# Patient Record
Sex: Female | Born: 1935 | Race: White | Hispanic: No | Smoking: Never smoker
Health system: Southern US, Community
[De-identification: ages and names within clinical notes are randomized; demographics above are authoritative.]

## PROBLEM LIST (undated history)

## (undated) DIAGNOSIS — F200 Paranoid schizophrenia: Secondary | ICD-10-CM

## (undated) DIAGNOSIS — F329 Major depressive disorder, single episode, unspecified: Secondary | ICD-10-CM

## (undated) DIAGNOSIS — E1169 Type 2 diabetes mellitus with other specified complication: Secondary | ICD-10-CM

## (undated) DIAGNOSIS — F409 Phobic anxiety disorder, unspecified: Secondary | ICD-10-CM

## (undated) DIAGNOSIS — N393 Stress incontinence (female) (male): Secondary | ICD-10-CM

## (undated) DIAGNOSIS — F32A Depression, unspecified: Secondary | ICD-10-CM

## (undated) DIAGNOSIS — E669 Obesity, unspecified: Secondary | ICD-10-CM

## (undated) DIAGNOSIS — G3184 Mild cognitive impairment, so stated: Secondary | ICD-10-CM

## (undated) DIAGNOSIS — H269 Unspecified cataract: Secondary | ICD-10-CM

## (undated) DIAGNOSIS — B369 Superficial mycosis, unspecified: Secondary | ICD-10-CM

## (undated) DIAGNOSIS — F6081 Narcissistic personality disorder: Secondary | ICD-10-CM

## (undated) DIAGNOSIS — I10 Essential (primary) hypertension: Secondary | ICD-10-CM

## (undated) DIAGNOSIS — H3530 Unspecified macular degeneration: Secondary | ICD-10-CM

## (undated) DIAGNOSIS — E785 Hyperlipidemia, unspecified: Secondary | ICD-10-CM

## (undated) DIAGNOSIS — D649 Anemia, unspecified: Secondary | ICD-10-CM

## (undated) DIAGNOSIS — E119 Type 2 diabetes mellitus without complications: Secondary | ICD-10-CM

## (undated) HISTORY — DX: Type 2 diabetes mellitus without complications: E11.9

## (undated) HISTORY — DX: Superficial mycosis, unspecified: B36.9

## (undated) HISTORY — DX: Obesity, unspecified: E66.9

## (undated) HISTORY — PX: ABDOMINAL HYSTERECTOMY: SHX81

## (undated) HISTORY — PX: TOTAL ABDOMINAL HYSTERECTOMY W/ BILATERAL SALPINGOOPHORECTOMY: SHX83

## (undated) HISTORY — DX: Mild cognitive impairment of uncertain or unknown etiology: G31.84

## (undated) HISTORY — DX: Hyperlipidemia, unspecified: E78.5

## (undated) HISTORY — DX: Essential (primary) hypertension: I10

## (undated) HISTORY — DX: Phobic anxiety disorder, unspecified: F40.9

## (undated) HISTORY — DX: Unspecified macular degeneration: H35.30

## (undated) HISTORY — DX: Paranoid schizophrenia: F20.0

## (undated) HISTORY — DX: Depression, unspecified: F32.A

## (undated) HISTORY — DX: Unspecified cataract: H26.9

## (undated) HISTORY — DX: Type 2 diabetes mellitus with other specified complication: E11.69

## (undated) HISTORY — DX: Stress incontinence (female) (male): N39.3

## (undated) HISTORY — DX: Anemia, unspecified: D64.9

## (undated) HISTORY — DX: Major depressive disorder, single episode, unspecified: F32.9

## (undated) HISTORY — DX: Narcissistic personality disorder: F60.81

---

## 1993-04-09 ENCOUNTER — Encounter (INDEPENDENT_AMBULATORY_CARE_PROVIDER_SITE_OTHER): Payer: Self-pay | Admitting: *Deleted

## 1993-04-09 LAB — CONVERTED CEMR LAB

## 1997-08-17 ENCOUNTER — Encounter: Admission: RE | Admit: 1997-08-17 | Discharge: 1997-08-17 | Payer: Self-pay | Admitting: Sports Medicine

## 1997-08-25 ENCOUNTER — Encounter: Admission: RE | Admit: 1997-08-25 | Discharge: 1997-08-25 | Payer: Self-pay | Admitting: Family Medicine

## 1998-06-07 ENCOUNTER — Encounter: Admission: RE | Admit: 1998-06-07 | Discharge: 1998-06-07 | Payer: Self-pay | Admitting: Family Medicine

## 1998-06-09 ENCOUNTER — Encounter: Admission: RE | Admit: 1998-06-09 | Discharge: 1998-06-09 | Payer: Self-pay | Admitting: Family Medicine

## 1998-06-22 ENCOUNTER — Encounter: Admission: RE | Admit: 1998-06-22 | Discharge: 1998-06-22 | Payer: Self-pay | Admitting: Sports Medicine

## 1998-07-05 ENCOUNTER — Encounter: Admission: RE | Admit: 1998-07-05 | Discharge: 1998-07-05 | Payer: Self-pay | Admitting: Family Medicine

## 1998-07-14 ENCOUNTER — Encounter: Admission: RE | Admit: 1998-07-14 | Discharge: 1998-07-14 | Payer: Self-pay | Admitting: Family Medicine

## 1998-08-17 ENCOUNTER — Encounter: Admission: RE | Admit: 1998-08-17 | Discharge: 1998-08-17 | Payer: Self-pay | Admitting: Family Medicine

## 1998-09-23 ENCOUNTER — Encounter: Admission: RE | Admit: 1998-09-23 | Discharge: 1998-09-23 | Payer: Self-pay | Admitting: Family Medicine

## 1998-12-14 ENCOUNTER — Encounter: Admission: RE | Admit: 1998-12-14 | Discharge: 1998-12-14 | Payer: Self-pay | Admitting: Family Medicine

## 1999-02-01 ENCOUNTER — Encounter: Admission: RE | Admit: 1999-02-01 | Discharge: 1999-02-01 | Payer: Self-pay | Admitting: Family Medicine

## 1999-03-13 ENCOUNTER — Encounter: Admission: RE | Admit: 1999-03-13 | Discharge: 1999-03-13 | Payer: Self-pay | Admitting: Family Medicine

## 1999-04-19 ENCOUNTER — Encounter: Admission: RE | Admit: 1999-04-19 | Discharge: 1999-04-19 | Payer: Self-pay | Admitting: Family Medicine

## 1999-07-14 ENCOUNTER — Encounter: Admission: RE | Admit: 1999-07-14 | Discharge: 1999-07-14 | Payer: Self-pay | Admitting: Family Medicine

## 1999-08-04 ENCOUNTER — Encounter: Admission: RE | Admit: 1999-08-04 | Discharge: 1999-08-04 | Payer: Self-pay | Admitting: Family Medicine

## 1999-10-13 ENCOUNTER — Encounter: Admission: RE | Admit: 1999-10-13 | Discharge: 1999-10-13 | Payer: Self-pay | Admitting: Family Medicine

## 1999-11-07 ENCOUNTER — Encounter: Admission: RE | Admit: 1999-11-07 | Discharge: 1999-11-07 | Payer: Self-pay

## 2000-04-24 ENCOUNTER — Encounter: Admission: RE | Admit: 2000-04-24 | Discharge: 2000-04-24 | Payer: Self-pay | Admitting: Family Medicine

## 2000-07-22 ENCOUNTER — Encounter: Admission: RE | Admit: 2000-07-22 | Discharge: 2000-07-22 | Payer: Self-pay | Admitting: Family Medicine

## 2000-09-25 ENCOUNTER — Encounter: Admission: RE | Admit: 2000-09-25 | Discharge: 2000-09-25 | Payer: Self-pay | Admitting: Family Medicine

## 2000-09-30 ENCOUNTER — Encounter: Admission: RE | Admit: 2000-09-30 | Discharge: 2000-09-30 | Payer: Self-pay | Admitting: Family Medicine

## 2000-11-11 ENCOUNTER — Encounter: Admission: RE | Admit: 2000-11-11 | Discharge: 2000-11-11 | Payer: Self-pay | Admitting: Family Medicine

## 2000-11-22 ENCOUNTER — Encounter: Admission: RE | Admit: 2000-11-22 | Discharge: 2000-11-22 | Payer: Self-pay | Admitting: Family Medicine

## 2001-01-14 ENCOUNTER — Encounter: Admission: RE | Admit: 2001-01-14 | Discharge: 2001-01-14 | Payer: Self-pay | Admitting: Family Medicine

## 2001-01-14 ENCOUNTER — Encounter: Payer: Self-pay | Admitting: Sports Medicine

## 2001-04-07 ENCOUNTER — Encounter: Admission: RE | Admit: 2001-04-07 | Discharge: 2001-04-07 | Payer: Self-pay | Admitting: Family Medicine

## 2001-04-14 ENCOUNTER — Encounter: Admission: RE | Admit: 2001-04-14 | Discharge: 2001-04-14 | Payer: Self-pay | Admitting: Sports Medicine

## 2001-04-24 ENCOUNTER — Encounter: Admission: RE | Admit: 2001-04-24 | Discharge: 2001-04-24 | Payer: Self-pay | Admitting: Family Medicine

## 2001-05-05 ENCOUNTER — Encounter: Admission: RE | Admit: 2001-05-05 | Discharge: 2001-05-05 | Payer: Self-pay | Admitting: Family Medicine

## 2001-10-09 ENCOUNTER — Encounter: Admission: RE | Admit: 2001-10-09 | Discharge: 2001-10-09 | Payer: Self-pay | Admitting: Family Medicine

## 2002-04-13 ENCOUNTER — Encounter: Admission: RE | Admit: 2002-04-13 | Discharge: 2002-04-13 | Payer: Self-pay | Admitting: Family Medicine

## 2002-06-03 ENCOUNTER — Encounter: Admission: RE | Admit: 2002-06-03 | Discharge: 2002-06-03 | Payer: Self-pay | Admitting: Family Medicine

## 2002-06-09 ENCOUNTER — Encounter: Admission: RE | Admit: 2002-06-09 | Discharge: 2002-06-09 | Payer: Self-pay | Admitting: Family Medicine

## 2002-06-09 ENCOUNTER — Encounter: Payer: Self-pay | Admitting: Sports Medicine

## 2002-07-02 ENCOUNTER — Encounter: Admission: RE | Admit: 2002-07-02 | Discharge: 2002-07-02 | Payer: Self-pay | Admitting: Family Medicine

## 2002-07-21 ENCOUNTER — Encounter: Admission: RE | Admit: 2002-07-21 | Discharge: 2002-07-21 | Payer: Self-pay | Admitting: Family Medicine

## 2002-07-23 ENCOUNTER — Encounter: Admission: RE | Admit: 2002-07-23 | Discharge: 2002-07-23 | Payer: Self-pay | Admitting: Family Medicine

## 2002-08-19 ENCOUNTER — Encounter: Admission: RE | Admit: 2002-08-19 | Discharge: 2002-08-19 | Payer: Self-pay | Admitting: Family Medicine

## 2002-09-23 ENCOUNTER — Encounter: Admission: RE | Admit: 2002-09-23 | Discharge: 2002-09-23 | Payer: Self-pay | Admitting: Family Medicine

## 2002-10-15 ENCOUNTER — Encounter: Admission: RE | Admit: 2002-10-15 | Discharge: 2002-10-15 | Payer: Self-pay | Admitting: Family Medicine

## 2003-02-25 ENCOUNTER — Encounter: Admission: RE | Admit: 2003-02-25 | Discharge: 2003-02-25 | Payer: Self-pay | Admitting: Family Medicine

## 2003-05-12 ENCOUNTER — Encounter: Admission: RE | Admit: 2003-05-12 | Discharge: 2003-05-12 | Payer: Self-pay | Admitting: Family Medicine

## 2003-07-14 ENCOUNTER — Encounter: Admission: RE | Admit: 2003-07-14 | Discharge: 2003-07-14 | Payer: Self-pay | Admitting: Sports Medicine

## 2004-05-10 ENCOUNTER — Ambulatory Visit: Payer: Self-pay | Admitting: Family Medicine

## 2004-05-24 ENCOUNTER — Ambulatory Visit: Payer: Self-pay | Admitting: Family Medicine

## 2004-05-24 HISTORY — PX: CRYOTHERAPY: SHX1416

## 2004-06-09 ENCOUNTER — Ambulatory Visit: Payer: Self-pay | Admitting: Family Medicine

## 2004-07-27 HISTORY — PX: OTHER SURGICAL HISTORY: SHX169

## 2004-08-23 ENCOUNTER — Ambulatory Visit: Payer: Self-pay | Admitting: Family Medicine

## 2004-08-29 ENCOUNTER — Ambulatory Visit: Payer: Self-pay | Admitting: Family Medicine

## 2004-09-20 ENCOUNTER — Encounter: Admission: RE | Admit: 2004-09-20 | Discharge: 2004-09-20 | Payer: Self-pay | Admitting: Sports Medicine

## 2004-09-22 HISTORY — PX: CATARACT EXTRACTION: SUR2

## 2004-10-06 ENCOUNTER — Ambulatory Visit: Payer: Self-pay | Admitting: Sports Medicine

## 2004-10-31 ENCOUNTER — Encounter: Admission: RE | Admit: 2004-10-31 | Discharge: 2004-10-31 | Payer: Self-pay | Admitting: Sports Medicine

## 2005-02-28 ENCOUNTER — Ambulatory Visit: Payer: Self-pay | Admitting: Family Medicine

## 2005-06-19 ENCOUNTER — Ambulatory Visit: Payer: Self-pay | Admitting: Family Medicine

## 2005-10-17 ENCOUNTER — Ambulatory Visit: Payer: Self-pay | Admitting: Family Medicine

## 2005-11-20 ENCOUNTER — Ambulatory Visit: Payer: Self-pay | Admitting: Family Medicine

## 2005-11-23 ENCOUNTER — Ambulatory Visit: Payer: Self-pay | Admitting: Family Medicine

## 2005-12-04 ENCOUNTER — Ambulatory Visit: Payer: Self-pay | Admitting: Sports Medicine

## 2005-12-21 ENCOUNTER — Encounter: Admission: RE | Admit: 2005-12-21 | Discharge: 2005-12-21 | Payer: Self-pay | Admitting: Sports Medicine

## 2006-01-22 ENCOUNTER — Ambulatory Visit: Payer: Self-pay | Admitting: Family Medicine

## 2006-02-05 ENCOUNTER — Ambulatory Visit: Payer: Self-pay | Admitting: Sports Medicine

## 2006-05-21 ENCOUNTER — Ambulatory Visit: Payer: Self-pay | Admitting: Family Medicine

## 2006-06-06 DIAGNOSIS — E119 Type 2 diabetes mellitus without complications: Secondary | ICD-10-CM

## 2006-06-06 DIAGNOSIS — F209 Schizophrenia, unspecified: Secondary | ICD-10-CM

## 2006-06-06 DIAGNOSIS — F329 Major depressive disorder, single episode, unspecified: Secondary | ICD-10-CM

## 2006-06-06 DIAGNOSIS — E1169 Type 2 diabetes mellitus with other specified complication: Secondary | ICD-10-CM

## 2006-06-06 DIAGNOSIS — I10 Essential (primary) hypertension: Secondary | ICD-10-CM

## 2006-06-06 DIAGNOSIS — H269 Unspecified cataract: Secondary | ICD-10-CM | POA: Insufficient documentation

## 2006-06-06 DIAGNOSIS — N393 Stress incontinence (female) (male): Secondary | ICD-10-CM

## 2006-06-06 DIAGNOSIS — J309 Allergic rhinitis, unspecified: Secondary | ICD-10-CM

## 2006-06-06 DIAGNOSIS — E785 Hyperlipidemia, unspecified: Secondary | ICD-10-CM

## 2006-06-06 DIAGNOSIS — E669 Obesity, unspecified: Secondary | ICD-10-CM

## 2006-06-06 HISTORY — DX: Type 2 diabetes mellitus without complications: E11.9

## 2006-06-06 HISTORY — DX: Unspecified cataract: H26.9

## 2006-06-07 ENCOUNTER — Encounter (INDEPENDENT_AMBULATORY_CARE_PROVIDER_SITE_OTHER): Payer: Self-pay | Admitting: *Deleted

## 2006-06-26 ENCOUNTER — Telehealth: Payer: Self-pay | Admitting: *Deleted

## 2006-07-01 ENCOUNTER — Telehealth (INDEPENDENT_AMBULATORY_CARE_PROVIDER_SITE_OTHER): Payer: Self-pay | Admitting: *Deleted

## 2006-07-26 ENCOUNTER — Ambulatory Visit: Payer: Self-pay | Admitting: Family Medicine

## 2006-07-26 ENCOUNTER — Encounter (INDEPENDENT_AMBULATORY_CARE_PROVIDER_SITE_OTHER): Payer: Self-pay | Admitting: Family Medicine

## 2006-07-26 LAB — CONVERTED CEMR LAB
ALT: 18 units/L (ref 0–35)
Albumin: 4 g/dL (ref 3.5–5.2)
Alkaline Phosphatase: 77 units/L (ref 39–117)
HDL: 40 mg/dL (ref >39)
Microalbumin U total vol: NEGATIVE mg/L
Total Bilirubin: 0.4 mg/dL (ref 0.3–1.2)
Total Protein: 7.1 g/dL (ref 6.0–8.3)
VLDL: 41 mg/dL — ABNORMAL HIGH (ref 0–40)

## 2006-10-18 ENCOUNTER — Ambulatory Visit: Payer: Self-pay | Admitting: Family Medicine

## 2006-11-19 ENCOUNTER — Encounter (INDEPENDENT_AMBULATORY_CARE_PROVIDER_SITE_OTHER): Payer: Self-pay | Admitting: Family Medicine

## 2006-11-26 ENCOUNTER — Telehealth (INDEPENDENT_AMBULATORY_CARE_PROVIDER_SITE_OTHER): Payer: Self-pay | Admitting: Family Medicine

## 2006-12-02 ENCOUNTER — Telehealth (INDEPENDENT_AMBULATORY_CARE_PROVIDER_SITE_OTHER): Payer: Self-pay | Admitting: Family Medicine

## 2006-12-16 ENCOUNTER — Telehealth: Payer: Self-pay | Admitting: *Deleted

## 2007-01-01 ENCOUNTER — Telehealth (INDEPENDENT_AMBULATORY_CARE_PROVIDER_SITE_OTHER): Payer: Self-pay | Admitting: Family Medicine

## 2007-01-13 ENCOUNTER — Telehealth: Payer: Self-pay | Admitting: *Deleted

## 2007-02-05 ENCOUNTER — Ambulatory Visit: Payer: Self-pay | Admitting: Family Medicine

## 2007-02-05 LAB — CONVERTED CEMR LAB: Hgb A1c MFr Bld: 6.5 %

## 2007-02-21 ENCOUNTER — Ambulatory Visit (HOSPITAL_COMMUNITY): Admission: RE | Admit: 2007-02-21 | Discharge: 2007-02-21 | Payer: Self-pay | Admitting: Family Medicine

## 2007-02-21 ENCOUNTER — Ambulatory Visit: Payer: Self-pay | Admitting: Cardiovascular Disease

## 2007-02-21 ENCOUNTER — Encounter: Payer: Self-pay | Admitting: Family Medicine

## 2007-02-24 ENCOUNTER — Telehealth (INDEPENDENT_AMBULATORY_CARE_PROVIDER_SITE_OTHER): Payer: Self-pay | Admitting: Family Medicine

## 2007-02-28 ENCOUNTER — Encounter (INDEPENDENT_AMBULATORY_CARE_PROVIDER_SITE_OTHER): Payer: Self-pay | Admitting: Family Medicine

## 2007-03-25 ENCOUNTER — Telehealth: Payer: Self-pay | Admitting: *Deleted

## 2007-03-27 ENCOUNTER — Ambulatory Visit: Payer: Self-pay | Admitting: Family Medicine

## 2007-04-07 ENCOUNTER — Telehealth: Payer: Self-pay | Admitting: *Deleted

## 2007-04-28 ENCOUNTER — Ambulatory Visit: Payer: Self-pay | Admitting: Family Medicine

## 2007-04-28 ENCOUNTER — Telehealth: Payer: Self-pay | Admitting: *Deleted

## 2007-05-01 ENCOUNTER — Ambulatory Visit: Payer: Self-pay | Admitting: Family Medicine

## 2007-05-01 ENCOUNTER — Encounter (INDEPENDENT_AMBULATORY_CARE_PROVIDER_SITE_OTHER): Payer: Self-pay | Admitting: Family Medicine

## 2007-05-23 ENCOUNTER — Ambulatory Visit: Payer: Self-pay | Admitting: Family Medicine

## 2007-05-23 ENCOUNTER — Encounter: Payer: Self-pay | Admitting: Family Medicine

## 2007-05-23 LAB — CONVERTED CEMR LAB
BUN: 20 mg/dL (ref 6–23)
Calcium: 9.3 mg/dL (ref 8.4–10.5)
Chloride: 101 meq/L (ref 96–112)
Creatinine, Ser: 0.9 mg/dL (ref 0.40–1.20)
Potassium: 4.1 meq/L (ref 3.5–5.3)
Sodium: 141 meq/L (ref 135–145)

## 2007-06-16 ENCOUNTER — Telehealth (INDEPENDENT_AMBULATORY_CARE_PROVIDER_SITE_OTHER): Payer: Self-pay | Admitting: Family Medicine

## 2007-06-16 ENCOUNTER — Encounter: Payer: Self-pay | Admitting: Family Medicine

## 2007-07-07 ENCOUNTER — Telehealth (INDEPENDENT_AMBULATORY_CARE_PROVIDER_SITE_OTHER): Payer: Self-pay | Admitting: Family Medicine

## 2007-07-07 ENCOUNTER — Encounter (INDEPENDENT_AMBULATORY_CARE_PROVIDER_SITE_OTHER): Payer: Self-pay | Admitting: Family Medicine

## 2007-07-29 ENCOUNTER — Ambulatory Visit: Payer: Self-pay | Admitting: Family Medicine

## 2007-07-29 ENCOUNTER — Encounter (INDEPENDENT_AMBULATORY_CARE_PROVIDER_SITE_OTHER): Payer: Self-pay | Admitting: Family Medicine

## 2007-08-21 ENCOUNTER — Encounter (INDEPENDENT_AMBULATORY_CARE_PROVIDER_SITE_OTHER): Payer: Self-pay | Admitting: Family Medicine

## 2007-08-26 ENCOUNTER — Telehealth (INDEPENDENT_AMBULATORY_CARE_PROVIDER_SITE_OTHER): Payer: Self-pay | Admitting: Family Medicine

## 2007-09-02 ENCOUNTER — Telehealth (INDEPENDENT_AMBULATORY_CARE_PROVIDER_SITE_OTHER): Payer: Self-pay | Admitting: Family Medicine

## 2007-09-08 ENCOUNTER — Telehealth: Payer: Self-pay | Admitting: *Deleted

## 2007-10-02 ENCOUNTER — Emergency Department (HOSPITAL_COMMUNITY): Admission: EM | Admit: 2007-10-02 | Discharge: 2007-10-02 | Payer: Self-pay | Admitting: Anesthesiology

## 2007-10-03 ENCOUNTER — Ambulatory Visit: Payer: Self-pay | Admitting: Family Medicine

## 2007-10-09 ENCOUNTER — Ambulatory Visit: Payer: Self-pay | Admitting: Family Medicine

## 2007-10-29 ENCOUNTER — Encounter (INDEPENDENT_AMBULATORY_CARE_PROVIDER_SITE_OTHER): Payer: Self-pay | Admitting: Family Medicine

## 2007-11-19 ENCOUNTER — Encounter (INDEPENDENT_AMBULATORY_CARE_PROVIDER_SITE_OTHER): Payer: Self-pay | Admitting: Family Medicine

## 2007-12-01 ENCOUNTER — Encounter (INDEPENDENT_AMBULATORY_CARE_PROVIDER_SITE_OTHER): Payer: Self-pay | Admitting: Family Medicine

## 2007-12-04 ENCOUNTER — Telehealth (INDEPENDENT_AMBULATORY_CARE_PROVIDER_SITE_OTHER): Payer: Self-pay | Admitting: *Deleted

## 2007-12-09 ENCOUNTER — Ambulatory Visit: Payer: Self-pay | Admitting: Family Medicine

## 2007-12-09 ENCOUNTER — Encounter (INDEPENDENT_AMBULATORY_CARE_PROVIDER_SITE_OTHER): Payer: Self-pay | Admitting: Family Medicine

## 2007-12-09 ENCOUNTER — Telehealth (INDEPENDENT_AMBULATORY_CARE_PROVIDER_SITE_OTHER): Payer: Self-pay | Admitting: *Deleted

## 2007-12-09 LAB — CONVERTED CEMR LAB: Hgb A1c MFr Bld: 6.3 %

## 2008-03-25 LAB — CONVERTED CEMR LAB
Cholesterol: 225 mg/dL — ABNORMAL HIGH (ref 0–200)
LDL Cholesterol: 127 mg/dL — ABNORMAL HIGH (ref 0–99)
VLDL: 58 mg/dL — ABNORMAL HIGH (ref 0–40)

## 2008-06-21 ENCOUNTER — Encounter (INDEPENDENT_AMBULATORY_CARE_PROVIDER_SITE_OTHER): Payer: Self-pay | Admitting: Family Medicine

## 2008-06-22 ENCOUNTER — Ambulatory Visit: Payer: Self-pay | Admitting: Family Medicine

## 2008-06-22 ENCOUNTER — Encounter (INDEPENDENT_AMBULATORY_CARE_PROVIDER_SITE_OTHER): Payer: Self-pay | Admitting: Family Medicine

## 2008-09-13 ENCOUNTER — Encounter: Payer: Self-pay | Admitting: Family Medicine

## 2008-11-16 ENCOUNTER — Ambulatory Visit: Payer: Self-pay | Admitting: Family Medicine

## 2008-11-16 ENCOUNTER — Telehealth: Payer: Self-pay | Admitting: *Deleted

## 2008-11-17 ENCOUNTER — Telehealth: Payer: Self-pay | Admitting: Family Medicine

## 2008-11-18 ENCOUNTER — Telehealth (INDEPENDENT_AMBULATORY_CARE_PROVIDER_SITE_OTHER): Payer: Self-pay | Admitting: Family Medicine

## 2008-11-19 ENCOUNTER — Telehealth (INDEPENDENT_AMBULATORY_CARE_PROVIDER_SITE_OTHER): Payer: Self-pay | Admitting: *Deleted

## 2008-12-15 ENCOUNTER — Encounter: Payer: Self-pay | Admitting: Family Medicine

## 2009-02-03 ENCOUNTER — Telehealth (INDEPENDENT_AMBULATORY_CARE_PROVIDER_SITE_OTHER): Payer: Self-pay | Admitting: Family Medicine

## 2009-03-02 ENCOUNTER — Telehealth: Payer: Self-pay | Admitting: Family Medicine

## 2009-03-09 ENCOUNTER — Telehealth: Payer: Self-pay | Admitting: *Deleted

## 2009-04-26 ENCOUNTER — Telehealth: Payer: Self-pay | Admitting: Family Medicine

## 2009-06-08 ENCOUNTER — Ambulatory Visit: Payer: Self-pay | Admitting: Family Medicine

## 2009-06-08 ENCOUNTER — Encounter: Payer: Self-pay | Admitting: Family Medicine

## 2009-06-08 LAB — CONVERTED CEMR LAB
AST: 18 units/L (ref 0–37)
Alkaline Phosphatase: 73 units/L (ref 39–117)
Creatinine, Ser: 0.96 mg/dL (ref 0.40–1.20)
Direct LDL: 72 mg/dL
Potassium: 4.2 meq/L (ref 3.5–5.3)
Total Bilirubin: 0.2 mg/dL — ABNORMAL LOW (ref 0.3–1.2)
Total Protein: 7.2 g/dL (ref 6.0–8.3)

## 2009-06-10 ENCOUNTER — Encounter: Payer: Self-pay | Admitting: Family Medicine

## 2009-07-21 ENCOUNTER — Telehealth: Payer: Self-pay | Admitting: Family Medicine

## 2009-07-29 ENCOUNTER — Encounter: Payer: Self-pay | Admitting: Family Medicine

## 2009-08-04 ENCOUNTER — Emergency Department (HOSPITAL_COMMUNITY): Admission: EM | Admit: 2009-08-04 | Discharge: 2009-08-04 | Payer: Self-pay | Admitting: Emergency Medicine

## 2009-08-08 ENCOUNTER — Encounter: Payer: Self-pay | Admitting: Family Medicine

## 2009-08-16 ENCOUNTER — Encounter: Payer: Self-pay | Admitting: Family Medicine

## 2009-08-24 ENCOUNTER — Encounter: Payer: Self-pay | Admitting: Family Medicine

## 2009-09-20 ENCOUNTER — Encounter: Payer: Self-pay | Admitting: Family Medicine

## 2009-09-20 ENCOUNTER — Ambulatory Visit: Payer: Self-pay | Admitting: Family Medicine

## 2009-09-20 DIAGNOSIS — L84 Corns and callosities: Secondary | ICD-10-CM | POA: Insufficient documentation

## 2009-09-20 DIAGNOSIS — M129 Arthropathy, unspecified: Secondary | ICD-10-CM | POA: Insufficient documentation

## 2009-09-20 LAB — CONVERTED CEMR LAB
BUN: 20 mg/dL (ref 6–23)
CO2: 26 meq/L (ref 19–32)
Calcium: 9.4 mg/dL (ref 8.4–10.5)
Creatinine, Ser: 0.83 mg/dL (ref 0.40–1.20)
Glucose, Bld: 56 mg/dL — ABNORMAL LOW (ref 70–99)
Sodium: 142 meq/L (ref 135–145)

## 2009-09-21 ENCOUNTER — Encounter: Payer: Self-pay | Admitting: Family Medicine

## 2009-09-21 ENCOUNTER — Telehealth: Payer: Self-pay | Admitting: *Deleted

## 2009-09-30 ENCOUNTER — Emergency Department (HOSPITAL_COMMUNITY): Admission: EM | Admit: 2009-09-30 | Discharge: 2009-09-30 | Payer: Self-pay | Admitting: Emergency Medicine

## 2009-09-30 LAB — CONVERTED CEMR LAB
AST: 19 units/L
Glucose, Urine, Semiquant: 160
Hemoglobin: 10.5 g/dL
MCV: 80.7 fL
Potassium: 4.1 meq/L

## 2009-10-13 ENCOUNTER — Ambulatory Visit: Payer: Self-pay | Admitting: Family Medicine

## 2009-10-13 DIAGNOSIS — R269 Unspecified abnormalities of gait and mobility: Secondary | ICD-10-CM

## 2009-10-13 DIAGNOSIS — D649 Anemia, unspecified: Secondary | ICD-10-CM | POA: Insufficient documentation

## 2009-10-13 DIAGNOSIS — F0390 Unspecified dementia without behavioral disturbance: Secondary | ICD-10-CM

## 2009-10-13 DIAGNOSIS — R42 Dizziness and giddiness: Secondary | ICD-10-CM

## 2009-10-19 ENCOUNTER — Telehealth: Payer: Self-pay | Admitting: Family Medicine

## 2009-10-21 ENCOUNTER — Telehealth: Payer: Self-pay | Admitting: *Deleted

## 2009-11-16 ENCOUNTER — Emergency Department (HOSPITAL_COMMUNITY): Admission: EM | Admit: 2009-11-16 | Discharge: 2009-11-16 | Payer: Self-pay | Admitting: Emergency Medicine

## 2009-12-29 ENCOUNTER — Telehealth: Payer: Self-pay | Admitting: Family Medicine

## 2010-01-17 ENCOUNTER — Ambulatory Visit: Payer: Self-pay | Admitting: Family Medicine

## 2010-02-01 ENCOUNTER — Telehealth: Payer: Self-pay | Admitting: *Deleted

## 2010-03-14 ENCOUNTER — Ambulatory Visit: Payer: Self-pay | Admitting: Family Medicine

## 2010-03-14 ENCOUNTER — Encounter: Payer: Self-pay | Admitting: Family Medicine

## 2010-03-14 DIAGNOSIS — B369 Superficial mycosis, unspecified: Secondary | ICD-10-CM | POA: Insufficient documentation

## 2010-03-14 LAB — CONVERTED CEMR LAB
AST: 17 units/L (ref 0–37)
Calcium: 9 mg/dL (ref 8.4–10.5)
Creatinine, Ser: 0.94 mg/dL (ref 0.40–1.20)
Total Bilirubin: 0.2 mg/dL — ABNORMAL LOW (ref 0.3–1.2)
Total Protein: 7.1 g/dL (ref 6.0–8.3)

## 2010-05-03 ENCOUNTER — Telehealth: Payer: Self-pay | Admitting: Family Medicine

## 2010-05-11 ENCOUNTER — Telehealth (INDEPENDENT_AMBULATORY_CARE_PROVIDER_SITE_OTHER): Payer: Self-pay | Admitting: Family Medicine

## 2010-05-11 NOTE — Progress Notes (Signed)
  Phone Note Outgoing Call   Call placed by: Lequita Asal  MD,  September 21, 2009 5:37 AM Summary of Call: fasting CBG on labs 52. please call facility and have them STOP GLUCOTROL and decrease metformin to once a day. order to follow in centricity. Initial call taken by: Lequita Asal  MD,  September 21, 2009 5:37 AM  Follow-up for Phone Call        spoke with Misty Stanley at [facility] and faxed over the med changes to [phone] Follow-up by: Loralee Pacas CMA,  September 21, 2009 12:16 PM

## 2010-05-11 NOTE — Assessment & Plan Note (Signed)
Summary: f/u ed,df   Vital Signs:  Patient profile:   77 year old female Height:      60.5 inches Weight:      178.4 pounds Pulse rate:   71 / minute BP sitting:   140 / 60  (right arm)  Vitals Entered By: Arlyss Repress CMA, (October 13, 2009 8:53 AM) CC: f/up ED [facility]. Is Patient Diabetic? Yes Pain Assessment Patient in pain? no        Primary Care Provider:  Lequita Asal  MD  CC:  f/up ED [facility].Marland Kitchen  History of Present Illness: 1. F/U ED visit for dizziness / headache:  Pt was seen in the ED about 2 weeks ago because of dizziness, headache, and some altered mental status.  Diagnosed with hyperglycemia and hypertension.  This self resolved and she was back to normal at the time of discharge.  She continues to have occassional dizziness spells and it seems to be worse when she goes from sitting to standing.  She also has some gait problems and needs to use a walker.      ROS: denies headache, fevers, dysuria, focal numbness / weakness, shaking  2. DMII:  Her CBGs have been acceptable since the change was made by PCP.  She dc'ed the Glipizide and decreased the Metformin.   CBGs have averaged  ~ 150 for the past week.  She hasn't had any other hypoglycemia episodes.      ROS: denies new vision changes  3. HTN:  Her blood pressure was elevated one time when she went to the ED about 2 weeks ago.  They have not been checking it regularly at the ALF.        ROS: denies chest pain, shortness of breath.  endorses minimal lower extremity swelling   Habits & Providers  Alcohol-Tobacco-Diet     Tobacco Status: never  Current Medications (verified): 1)  Bayer Childrens Aspirin 81 Mg Chew (Aspirin) .... Take 1 Tablet By Mouth Once A Day 2)  Fexofenadine Hcl 180 Mg Tabs (Fexofenadine Hcl) .Marland Kitchen.. 1 Tab By Mouth Daily 3)  Colace 100 Mg Caps (Docusate Sodium) .... Take 1 Capsule By Mouth Twice A Day 4)  Flonase 50 Mcg/act Susp (Fluticasone Propionate) .... Spray 2 Spray Into Both Nostrils Once A  Day 5)  Ibuprofen 400 Mg Tabs (Ibuprofen) .... Take 1 Tablet By Mouth Three Times A Day 6)  Lipitor 20 Mg Tabs (Atorvastatin Calcium) .... Take 1 Tablet By Mouth Every Night 7)  Prevacid 30 Mg Cpdr (Lansoprazole) .... Take 1 Capsule By Mouth Once A Day 8)  Citalopram Hydrobromide 20 Mg  Tabs (Citalopram Hydrobromide) .... 1.5 Tabs Daily 9)  Ketoconazole 2 % Crea (Ketoconazole) .... Apply To Groin and Buttocks Two Times A Day 10)  Metformin Hcl 500 Mg Tabs (Metformin Hcl) .Marland Kitchen.. 1 Tab By Mouth Once  A Day 11)  Acetaminophen 650 Mg Cr-Tabs (Acetaminophen) .... One Tab By Mouth Q6 Hours Scheduled. 12)  Multivitamins  Tabs (Multiple Vitamin) .... One Tab By Mouth Daily 13)  Oscal 500/200 D-3 500-200 Mg-Unit Tabs (Calcium-Vitamin D) .... One Tab By Mouth Bid 14)  Diovan Hct 160-12.5 Mg Tabs (Valsartan-Hydrochlorothiazide) .... One Tab By Mouth Q.a.m.  Allergies (verified): No Known Drug Allergies  Past History:  Past Medical History: gen phobia, inpt psych hospitalizations 1970s [place] macular degeneration MMSE 2/04 29/30 narcissism  paranoid schizophrenia    Social History: Reviewed history from 06/06/2006 and no changes required. Lives in [facility] (assisted living),  remotely smoked and chewed tobacco. Daughter([name])  drives her, independent of all ADL'sSmoking Status:  never  Physical Exam  General:  Vitals reviewed.  Well-developed,well-nourished,in no acute distress; alert,appropriate and cooperative throughout examination Head:  Normocephalic and atraumatic Eyes:  No corneal or conjunctival inflammation noted. EOMI. Retinal degeneration seen on fundoscopic exam in left eye. Ears:  Negative Dix-Hallpike Maneuver Neck:  supple and no masses.   Lungs:  Normal respiratory effort, chest expands symmetrically. Lungs are clear to auscultation, no crackles or wheezes. Heart:  Normal rate and regular rhythm. Distant heart sounds.  2/6 SEM at [facility].  pt states has had a murmur  her whole life Abdomen:  obese, soft, non-tender, and normal bowel sounds.   Msk:  Hips: normal ROM. no joint swelling, tenderness Knees: normal ROM.  No joint line tenderness Back:  Extension to 10degrees   Extremities:  2+ LE edema on the right and 1+  edema on the left  Neurologic:  See MMSE alert & oriented X3, cranial nerves II-XII intact, and strength normal in all extremities.  Narrow gait, staggers at times, denies pain, less time spent L leg, relatively equal stride initially, discontinuous turning.  Skin:  Friable skin, areas of broken capillaries on skin Psych:  normally interactive, good eye contact, not anxious appearing, and not depressed appearing.  Endorses some occassional hallucinations but no paranoid thoughts. Additional Exam:  Head CT on 09/30/09:  No acute intracranial abnormality.  Mild atrophy and chronic small vessel disease   Impression & Recommendations:  Problem # 1:  DIZZINESS (ICD-780.4) Assessment New  Seems to be more of a chronic issue that was exacerbated with hyperglycemia prior to her ED visit.  She denies any current dizziness and states that it is usually when she goes from sitting to standing.   It is likely related to orthostatic hypotension.  Dix-Hallpike was negative so less likely vestibular in origin.  Avoid over medicating for hypertension.  Continue PT/OT and use of a walker.  Will discontinue Depakote in case it could be contributing and she is not having any further behavior issues. Her updated medication list for this problem includes:    Fexofenadine Hcl 180 Mg Tabs (Fexofenadine hcl) .Marland Kitchen... 1 tab by mouth daily  Orders: [facility]- Est  Level 4 (16109)  Problem # 2:  DIABETES MELLITUS II, UNCOMPLICATED (ICD-250.00) A1c next visit Her CBGs seem acceptable with the change in medications ( ~150).  Will not make any other changes. Her updated medication list for this problem includes:    Bayer Childrens Aspirin 81 Mg Chew (Aspirin) .Marland Kitchen... Take 1  tablet by mouth once a day    Metformin Hcl 500 Mg Tabs (Metformin hcl) .Marland Kitchen... 1 tab by mouth once  a day    Diovan Hct 160-12.5 Mg Tabs (Valsartan-hydrochlorothiazide) ..... One tab by mouth q.a.m.  Orders: [facility]- Est  Level 4 (60454)  Problem # 3:  HYPERTENSION, BENIGN SYSTEMIC (ICD-401.1) Assessment: Unchanged  BP is acceptable today.  If she continues to complain of orthostatitc symptoms.  Would get formal orthostatic BP reading and consider decreasing BP meds. Her updated medication list for this problem includes:    Diovan Hct 160-12.5 Mg Tabs (Valsartan-hydrochlorothiazide) ..... One tab by mouth q.a.m.  Orders: [facility]- Est  Level 4 (99214)  Problem # 4:  MILD COGNITIVE IMPAIRMENT SO STATED (ICD-331.83) Assessment: New Would get TSH, B12 next visit MMSE performed which she actually did well on (27/30).  There is some family concerns.  Would consider rechecking this in  1 year.  Orders: [facility]- Est  Level 4 (04540)  Problem # 5:  GAIT DISTURBANCE (ICD-781.2)  Multifactorial. Encourage more walking with walker and standing up exercises since hip extensors are weak.   Orders: [facility]- Est  Level 4 (98119)  Problem # 6:  ANEMIA, NORMOCYTIC (ICD-285.9) Repeat CBC next visit  Complete Medication List: 1)  Bayer Childrens Aspirin 81 Mg Chew (Aspirin) .... Take 1 tablet by mouth once a day 2)  Fexofenadine Hcl 180 Mg Tabs (Fexofenadine hcl) .Marland Kitchen.. 1 tab by mouth daily 3)  Colace 100 Mg Caps (Docusate sodium) .... Take 1 capsule by mouth twice a day 4)  Flonase 50 Mcg/act Susp (Fluticasone propionate) .... Spray 2 spray into both nostrils once a day 5)  Ibuprofen 400 Mg Tabs (Ibuprofen) .... Take 1 tablet by mouth three times a day 6)  Lipitor 20 Mg Tabs (Atorvastatin calcium) .... Take 1 tablet by mouth every night 7)  Prevacid 30 Mg Cpdr (Lansoprazole) .... Take 1 capsule by mouth once a day 8)  Citalopram Hydrobromide 20 Mg Tabs (Citalopram hydrobromide) .... 1.5 tabs daily 9)   Ketoconazole 2 % Crea (Ketoconazole) .... Apply to groin and buttocks two times a day 10)  Metformin Hcl 500 Mg Tabs (Metformin hcl) .Marland Kitchen.. 1 tab by mouth once  a day 11)  Acetaminophen 650 Mg Cr-tabs (Acetaminophen) .... One tab by mouth q6 hours scheduled. 12)  Multivitamins Tabs (Multiple vitamin) .... One tab by mouth daily 13)  Oscal 500/200 D-3 500-200 Mg-unit Tabs (Calcium-vitamin d) .... One tab by mouth bid 14)  Diovan Hct 160-12.5 Mg Tabs (Valsartan-hydrochlorothiazide) .... One tab by mouth q.a.m.  Patient Instructions: 1)  Overall Franca is doing well 2)  We are not going to make any changes to her Diabetes or Blood Pressure medications. 3)  We are going to stop the Depakote.  Please keep [place] updated on any behavior changes. 4)  Continue to participate in Physical and Occupational Therapy 5)  Please schedule a follow up appointment in 8 weeks   Geriatric Assessment:  Mental Status Exam: (value/max value)    Orientation to Time: 5/5    Orientation to Place: 5/5    Registration: 3/3    Attention/Calculation: 5/5    Recall: 1/3    Language-name 2 objects: 2/2    Language-repeat: 1/1    Language-follow 3-step command: 2/3    Language-read and follow direction: 1/1    Write a sentence: 1/1    Copy design: 1/1 MSE Total score: 27/30  Gait: (value/max value)    Initiation of gait: 1/1 Gait Total Score: 1/12  Balance + Gait Total Score: 1/26   Geriatric Assessment:  Mental Status Exam: (value/max value)    Orientation to Time: 5/5    Orientation to Place: 5/5    Registration: 3/3    Attention/Calculation: 5/5    Recall: 1/3    Language-name 2 objects: 2/2    Language-repeat: 1/1    Language-follow 3-step command: 2/3    Language-read and follow direction: 1/1    Write a sentence: 1/1    Copy design: 1/1 MSE Total score: 27/30  Gait: (value/max value)    Initiation of gait: 1/1 Gait Total Score: 1/12  Balance + Gait Total Score: 1/26    CT  Brain  Procedure date:  09/30/2009  Findings:      chronic small vessel disease  Diffuse cortical atrophy noted.    CT Brain  Procedure date:  09/30/2009  Findings:  chronic small vessel disease  Diffuse cortical atrophy noted.     -  Date:  09/30/2009    NA 136    K 4.1    CREAT 1.01    GLU 160    SGOT 19    SGPT 19    ALK PHOS 65    HGB 10.5    PLTS 205    MCV 80.7       Prevention & Chronic Care Immunizations   Influenza vaccine: Fluvax 3+  (02/05/2007)   Influenza vaccine deferral: Not available  (09/20/2009)   Influenza vaccine due: 02/05/2008    Tetanus booster: 10/07/2001: Done.   Tetanus booster due: 10/08/2011    Pneumococcal vaccine: Done.  (04/10/1999)   Pneumococcal vaccine deferral: Not indicated  (09/20/2009)   Pneumococcal vaccine due: None    H. zoster vaccine: Not documented  Colorectal Screening   Hemoccult: Done.  (09/12/2004)   Hemoccult due: Not Indicated    Colonoscopy: Done.  (09/08/1998)   Colonoscopy action/deferral: Deferred  (06/08/2009)   Colonoscopy due: 09/07/2008  Other Screening   Pap smear: Done.  (04/09/1993)   Pap smear action/deferral: Not indicated S/P hysterectomy  (06/08/2009)   Pap smear due: Not Indicated    Mammogram: normal bx after abnormal mammogram  (08/24/2009)   Mammogram due: 08/25/2010    DXA bone density scan: Done.  (10/08/2002)   DXA scan due: None    Smoking status: never  (10/13/2009)  Diabetes Mellitus   HgbA1C: 5.8  (09/20/2009)   Hemoglobin A1C due: 10/28/2007    Eye exam: Not documented    Foot exam: Not documented   High risk foot: Not documented   Foot care education: Not documented    Urine microalbumin/creatinine ratio: Not documented   Urine microalbumin/cr due: 07/26/2007    Diabetes flowsheet reviewed?: Yes   Progress toward A1C goal: At goal  Lipids   Total Cholesterol: 225  (12/09/2007)   LDL: 127  (12/09/2007)   LDL Direct: 72  (06/08/2009)   HDL: 40   (12/09/2007)   Triglycerides: 291  (12/09/2007)    SGOT (AST): 19  (09/30/2009)   SGPT (ALT): 19  (09/30/2009)   Alkaline phosphatase: 65  (09/30/2009)   Total bilirubin: 0.2  (06/08/2009)    Lipid flowsheet reviewed?: Yes  Hypertension   Last Blood Pressure: 140 / 60  (10/13/2009)   Serum creatinine: 1.01  (09/30/2009)   Serum potassium 4.1  (09/30/2009)    Hypertension flowsheet reviewed?: Yes   Progress toward BP goal: Improved  Self-Management Support :   Personal Goals (by the next clinic visit) :     Personal A1C goal: 8  (06/08/2009)     Personal blood pressure goal: 140/90  (06/08/2009)     Personal LDL goal: 100  (06/08/2009)    Diabetes self-management support: Not documented    Diabetes self-management support not done because: Good outcomes  (06/08/2009)    Hypertension self-management support: Written self-care plan  (06/08/2009)    Lipid self-management support: Not documented     Lipid self-management support not done because: Good outcomes  (09/20/2009)

## 2010-05-11 NOTE — Progress Notes (Signed)
Summary: PCP change request  Phone Note Call from Patient Call back at [phone]   Caller: [name]/daughter Reason for Call: Talk to Nurse Summary of Call: pts daughter needs to schedule an appt for pt however cannot schedule with de la cruz bc she is only here in the pm, only can come am bc of her work schedule, explained we normally do not change doctors  to accomodate schedules  but would forward to my supervisor to see what we can do, pt needs to be seen soon bc she is falling asleep during PT, see previous notes. Initial call taken by: Knox Royalty,  October 21, 2009 3:29 PM  Follow-up for Phone Call        Have changed to Lloyd Huger who does have am appointments. Follow-up by: Dennison Nancy RN,  October 25, 2009 3:17 PM

## 2010-05-11 NOTE — Assessment & Plan Note (Signed)
Summary: f/u,df   Vital Signs:  Patient profile:   77 year old female Weight:      179 pounds Temp:     98.6 degrees F oral Pulse rate:   79 / minute Pulse rhythm:   regular BP sitting:   148 / 76  (left arm) Cuff size:   large  Vitals Entered By: Loralee Pacas CMA (March 14, 2010 3:54 PM) CC: follow-up visit   Primary Provider:  Lloyd Huger MD  CC:  follow-up visit.  History of Present Illness: Patient accompanied by daughter, from [employer] (ALF).   1. DM: Patient has fasting BS taken at [facility], values ranging 187-202 over past 2 weeks. Had episode of hypoglycemia in July this year requiring presentation [facility] ED. After this, her glipizide was discontinued. Takes metformin daily now. Sees podiatrist q 3 months.  Last A1c was 5.8. Weight is stable.   2. HTN: Last recorded value from nursing home is 132/74, this is typical. Takes diovan daily. Occasionally has episodes of dizzyness and instability on her feet, not lately.  3. Incontinence: Some leakage with coughing. Also feels like she urinates late at night too frequently, but she does make it to the restroom. Drinks up to 5 cups of decaf coffee before lunch time.   4. Rash: Has a chronic perirectal erythematous, pruritic rash that is followed by her Dermatologist Dr. Roseanne Reno. She uses a daily antifungal cream on this rash. Feels it has improved over the past few months.   Allergies: No Known Drug Allergies  Past History:  Past Medical History: Last updated: 10/13/2009 gen phobia, inpt psych hospitalizations 1970s [place] macular degeneration MMSE 2/04 29/30 narcissism  paranoid schizophrenia    Past Surgical History: Last updated: 07/26/2006 adenosine thallium - 08/07/2004 Cataract surgery - 09/22/2004 Cryotherapy (facial AK) - 05/24/2004 hysterectomy & BSO (due to endometriosis, non- -, malignant) shave bx-hyperplastic AK - 07/27/2004  Family History: Last updated: 06/06/2006 A and U (DM), C (liver  and colon CA), M and F (CAD, CVA)  Social History: Last updated: 06/06/2006 Lives in [facility] (assisted living), remotely smoked and chewed tobacco. Daughter([name])  drives her, independent of all ADL's  Risk Factors: Smoking Status: never (10/13/2009)  Review of Systems  The patient denies anorexia, fever, weight loss, weight gain, chest pain, syncope, dyspnea on exertion, and abdominal pain.    Physical Exam  General:  alert, well-developed, well-nourished, well-hydrated, and appropriate dress.   Head:  normocephalic and atraumatic.   Eyes:  PERRLA, EOMI Mouth:  Oral mucosa and oropharynx without lesions or exudates.  Teeth in good repair. Lungs:  Normal respiratory effort, chest expands symmetrically. Lungs are clear to auscultation, no crackles or wheezes. Heart:  Normal rate and regular rhythm. S1 and S2 normal without gallop, murmur, click, rub or other extra sounds. Abdomen:  Bowel sounds positive,abdomen soft and non-tender without masses, organomegaly or hernias noted. Rectal:  no external abnormalities.  Erythematous hypertrophied plaque on bilateral perineal area, mild scaling. No skin breakdown.  Msk:  No deformity or scoliosis noted of thoracic or lumbar spine.   Extremities:  trace bilateral pedal edema.   Neurologic:  No cranial nerve deficits noted. Station and gait are normal.  Sensory, motor and coordinative functions appear intact. Psych:  Cognition and judgment appear intact. Alert and cooperative with normal attention span and concentration. No apparent delusions, illusions, hallucinations  Diabetes Management Exam:    Foot Exam (with socks and/or shoes not present):  Sensory-Pinprick/Light touch:          Left medial foot (L-4): diminished          Left dorsal foot (L-5): normal          Left lateral foot (S-1): normal          Right medial foot (L-4): normal          Right dorsal foot (L-5): normal          Right lateral foot (S-1):  diminished       Sensory-Monofilament:          Left foot: diminished          Right foot: diminished       Inspection:          Left foot: normal          Right foot: normal       Nails:          Left foot: thickened          Right foot: thickened   Impression & Recommendations:  Problem # 1:  DIABETES MELLITUS II, UNCOMPLICATED (ICD-250.00) Assessment Deteriorated A1C has increased 1.5 points since discontinuation of sulfonylurea, still falls within controlled range. Will try to avoid agents that would cause repeated hypoglycemic events. May consider increasing metformin dose if renal function allows and A1c falls outside desirable range. No changes today.   Her updated medication list for this problem includes:    Bayer Childrens Aspirin 81 Mg Chew (Aspirin) .Marland Kitchen... Take 1 tablet by mouth once a day    Metformin Hcl 500 Mg Tabs (Metformin hcl) .Marland Kitchen... 1 tab by mouth once  a day    Diovan Hct 160-12.5 Mg Tabs (Valsartan-hydrochlorothiazide) ..... One tab by mouth q.a.m.  Orders: Comp Met-[facility] (09323-55732) A1C-[facility] (20254) [facility]- Est  Level 4 (27062)  Problem # 2:  HYPERTENSION, BENIGN SYSTEMIC (ICD-401.1) Will allow values slightly over goal to avoid orthostasis. Continue current management on ARB.   Her updated medication list for this problem includes:    Diovan Hct 160-12.5 Mg Tabs (Valsartan-hydrochlorothiazide) ..... One tab by mouth q.a.m.  Orders: [facility]- Est  Level 4 (37628)  Problem # 3:  INCONTINENCE, STRESS, FEMALE (ICD-625.6) Advised patient to take diuretic in am, and to avoid drinking significant amount of beverages with 3 hours prior to bedtime to avoid her frequency at night time. Also to avoid caffeine intake and to choose decaffinated coffee in future.  Orders: [facility]- Est  Level 4 (31517)  Problem # 4:  ARTHRITIS (ICD-716.90) Has pain in joints and low back. Taking low dose motrin and tylenol daily. Pain is tolerable. Ultram available as needed.   Problem # 5:   FUNGAL DERMATITIS (ICD-111.9) This problem diagnosed by Dermatologist in 2009, more chronic than candidal infection. Unsure of the condition's name. Pruritic and irritation are currently well controlled with daily anti-fungal cream.  Her updated medication list for this problem includes:    Ketoconazole 2 % Crea (Ketoconazole) .Marland Kitchen... Apply to groin and buttocks two times a day  Complete Medication List: 1)  Bayer Childrens Aspirin 81 Mg Chew (Aspirin) .... Take 1 tablet by mouth once a day 2)  Fexofenadine Hcl 180 Mg Tabs (Fexofenadine hcl) .Marland Kitchen.. 1 tab by mouth daily 3)  Colace 100 Mg Caps (Docusate sodium) .... Take 1 capsule by mouth twice a day 4)  Flonase 50 Mcg/act Susp (Fluticasone propionate) .... Spray 2 spray into both nostrils once a day 5)  Ibuprofen 400 Mg Tabs (  Ibuprofen) .... Take 1 tablet by mouth three times a day 6)  Lipitor 20 Mg Tabs (Atorvastatin calcium) .... Take 1 tablet by mouth every night 7)  Prevacid 30 Mg Cpdr (Lansoprazole) .... Take 1 capsule by mouth once a day 8)  Citalopram Hydrobromide 20 Mg Tabs (Citalopram hydrobromide) .... 1.5 tabs daily 9)  Ketoconazole 2 % Crea (Ketoconazole) .... Apply to groin and buttocks two times a day 10)  Metformin Hcl 500 Mg Tabs (Metformin hcl) .Marland Kitchen.. 1 tab by mouth once  a day 11)  Acetaminophen 650 Mg Cr-tabs (Acetaminophen) .... One tab by mouth q6 hours scheduled. 12)  Multivitamins Tabs (Multiple vitamin) .... One tab by mouth daily 13)  Oscal 500/200 D-3 500-200 Mg-unit Tabs (Calcium-vitamin d) .... One tab by mouth bid 14)  Diovan Hct 160-12.5 Mg Tabs (Valsartan-hydrochlorothiazide) .... One tab by mouth q.a.m. 15)  Tramadol Hcl 50 Mg Tabs (Tramadol hcl) .... Take one by mouth q 8 hr as needed for pain.  Patient Instructions: 1)  Nice to meet you. 2)  You should take your blood pressure medicine in the morning to avoid urge to urinate at night time. 3)  Limit beverages for 3 hours prior to bedtime. 4)  Will call if your  labs are abnormal.   Orders Added: 1)  Comp Met-[facility] [80053-22900] 2)  A1C-[facility] [83036] 3)  [facility]- Est  Level 4 [10272]    Laboratory Results   Blood Tests   Date/Time Received: March 14, 2010 4:22 PM  Date/Time Reported: March 14, 2010 4:36 PM   HGBA1C: 7.3%   (Normal Range: Non-Diabetic - 3-6%   Control Diabetic - 6-8%)  Comments: ...........test performed by...........Marland KitchenTerese Door, CMA       Prevention & Chronic Care Immunizations   Influenza vaccine: Fluvax MCR  (01/17/2010)   Influenza vaccine deferral: Not available  (09/20/2009)   Influenza vaccine due: 02/05/2008    Tetanus booster: 10/07/2001: Done.   Tetanus booster due: 10/08/2011    Pneumococcal vaccine: Done.  (04/10/1999)   Pneumococcal vaccine deferral: Not indicated  (09/20/2009)   Pneumococcal vaccine due: None    H. zoster vaccine: Not documented   H. zoster vaccine deferral: Not available  (03/14/2010)  Colorectal Screening   Hemoccult: Done.  (09/12/2004)   Hemoccult due: Not Indicated    Colonoscopy: Done.  (09/08/1998)   Colonoscopy action/deferral: Deferred  (06/08/2009)   Colonoscopy due: 09/07/2008  Other Screening   Pap smear: Done.  (04/09/1993)   Pap smear action/deferral: Not indicated S/P hysterectomy  (06/08/2009)   Pap smear due: Not Indicated    Mammogram: normal bx after abnormal mammogram  (08/24/2009)   Mammogram due: 08/25/2010    DXA bone density scan: Done.  (10/08/2002)   DXA scan due: None    Smoking status: never  (10/13/2009)  Diabetes Mellitus   HgbA1C: 7.3  (03/14/2010)   Hemoglobin A1C due: 10/28/2007    Eye exam: Not documented    Foot exam: yes  (03/14/2010)   High risk foot: Not documented   Foot care education: Not documented   Foot exam due: 03/15/2011    Urine microalbumin/creatinine ratio: Not documented   Urine microalbumin action/deferral: Not indicated   Urine microalbumin/cr due: 07/26/2007    Diabetes flowsheet reviewed?:  Yes   Progress toward A1C goal: At goal  Lipids   Total Cholesterol: 225  (12/09/2007)   Lipid panel action/deferral: Not indicated   LDL: 127  (12/09/2007)   LDL Direct: 72  (06/08/2009)   HDL:  40  (12/09/2007)   Triglycerides: 291  (12/09/2007)    SGOT (AST): 19  (09/30/2009)   SGPT (ALT): 19  (09/30/2009) CMP ordered    Alkaline phosphatase: 65  (09/30/2009)   Total bilirubin: 0.2  (06/08/2009)    Lipid flowsheet reviewed?: Yes   Progress toward LDL goal: Unchanged  Hypertension   Last Blood Pressure: 148 / 76  (03/14/2010)   Serum creatinine: 1.01  (09/30/2009)   Serum potassium 4.1  (09/30/2009) CMP ordered     Hypertension flowsheet reviewed?: Yes   Progress toward BP goal: At goal  Self-Management Support :   Personal Goals (by the next clinic visit) :     Personal A1C goal: 8  (06/08/2009)     Personal blood pressure goal: 140/90  (06/08/2009)     Personal LDL goal: 100  (06/08/2009)    Diabetes self-management support: Not documented    Diabetes self-management support not done because: Good outcomes  (06/08/2009)    Hypertension self-management support: Written self-care plan  (06/08/2009)    Lipid self-management support: Not documented     Lipid self-management support not done because: Good outcomes  (09/20/2009)

## 2010-05-11 NOTE — Progress Notes (Signed)
  Phone Note Call from Patient   Caller: Daughter-[name] Call For: [phone] Summary of Call: Returned your call and would like you call back regarding her mother.  Will be available for an hour. Initial call taken by: Abundio Miu,  February 01, 2010 11:33 AM  Follow-up for Phone Call        spoke with daughter and advised her to call to schedule appointment with new PCP. Follow-up by: Theresia Lo RN,  February 01, 2010 12:08 PM

## 2010-05-11 NOTE — Progress Notes (Signed)
Summary: meds   Phone Note Call from Patient Call back at [phone]   Caller: daughter-[name] Summary of Call: which pain meds is for athritis and which is for headaches?  Ibuprofen & Acetaminaphen  Initial call taken by: De Nurse,  July 21, 2009 9:16 AM  Follow-up for Phone Call        lm.  Follow-up by: Golden Circle RN,  July 21, 2009 9:22 AM  Additional Follow-up for Phone Call Additional follow up Details #1::        spoke with dtr. states [facility] gives her Ibu as needed & tylenol 500mg  two times a day. told her this was not what md wrote.  faxed her current med list to [facility] which states Ibu 400mg  three times a day and tylenol 650mg  one q6 hr as needed pain.  they had been giving tylenol 500mg  two times a day. and as needed ibuprofen faxed to [phone] attn: Tiffany Additional Follow-up by: Golden Circle RN,  July 21, 2009 9:47 AM    Additional Follow-up for Phone Call Additional follow up Details #2::    called dtr back to tell her I have spoken to them & faxed a med list. asked that she call me back. I want her to speak with supervisor as not giving meds as ordered is bad for the pt & could have repercussions with state agency that licenses them. Follow-up by: Golden Circle RN,  July 21, 2009 10:04 AM

## 2010-05-11 NOTE — Miscellaneous (Signed)
  Clinical Lists Changes  Medications: Added new medication of ACETAMINOPHEN 500 MG CAPS (ACETAMINOPHEN) one tab by mouth three times a day as needed pain Added new medication of MULTIVITAMINS  TABS (MULTIPLE VITAMIN) one tab by mouth daily Added new medication of OSCAL 500/200 D-3 500-200 MG-UNIT TABS (CALCIUM-VITAMIN D) one tab by mouth bid

## 2010-05-11 NOTE — Assessment & Plan Note (Signed)
Summary: FLU SHOT/KH  Nurse Visit   Vital Signs:  Patient profile:   77 year old female Temp:     98.9 degrees F  Vitals Entered By: Theresia Lo RN (January 17, 2010 12:10 PM)  Allergies: No Known Drug Allergies  Immunizations Administered:  Influenza Vaccine # 1:    Vaccine Type: Fluvax MCR    Site: left deltoid    Mfr: GlaxoSmithKline    Dose: 0.5 ml    Route: IM    Given by: Theresia Lo RN    Exp. Date: 10/04/2010    Lot #: [id number]    VIS given: 11/01/09 version given January 17, 2010.  Flu Vaccine Consent Questions:    Do you have a history of severe allergic reactions to this vaccine? no    Any prior history of allergic reactions to egg and/or gelatin? no    Do you have a sensitivity to the preservative Thimersol? no    Do you have a past history of Guillan-Barre Syndrome? no    Do you currently have an acute febrile illness? no    Have you ever had a severe reaction to latex? no    Vaccine information given and explained to patient? yes    Are you currently pregnant? no  Orders Added: 1)  Influenza Vaccine MCR [00025] 2)  Administration Flu vaccine - MCR [G0008]

## 2010-05-11 NOTE — Progress Notes (Signed)
Summary: Update on pt  Phone Note Call from Patient Call back at [phone]   Caller: [name]/daughter Reason for Call: Talk to Nurse Summary of Call: since pt has stopped her depakote she is very tired, falls asleep during physical therapy which daughter sts is not normal for her Initial call taken by: Knox Royalty,  October 19, 2009 9:28 AM  Follow-up for Phone Call        spoke with [name] and she told me that the pt has been falling asleep during her PT which is very unusual for her because anytime that she is up she is awake she is alert and functioning Follow-up by: Loralee Pacas CMA,  October 19, 2009 9:53 AM    forwarded to pcp.Loralee Pacas CMA  October 19, 2009 12:12 PM  Appended Document: Update on pt Spoke to [name] (daugher) today 10/21/09 at 3pm.  Pt still falling asleep during PT.  Patient is sleeping well at night and does not complain of delusions or hallucinations.  Discussed with daughter that her fatigue is unlikely due to Depakote discontinuation and will need to be seen by [facility] team physician to evaluate further.  Daughter agreed to call [facility] and schedule appt with physician.

## 2010-05-11 NOTE — Assessment & Plan Note (Signed)
Summary: f/u,df   Vital Signs:  Patient profile:   77 year old female Weight:      184.5 pounds BMI:     35.57 Temp:     98.3 degrees F oral Pulse rate:   66 / minute BP sitting:   153 / 75  (left arm) Cuff size:   large  Vitals Entered By: Loralee Pacas CMA (June 08, 2009 4:24 PM)  Nutrition Counseling: Patient's BMI is greater than 25 and therefore counseled on weight management options. CC: f/u DM, HTN, HLD   Primary Care Provider:  Lequita Asal  MD  CC:  f/u DM, HTN, and HLD.  History of Present Illness: 77 y/o female here for f/u  HTN- on diovan and norvasc. denies chest pain, SOB, headaches, peripheral edema  DM- CBGs not elevated per patient. on metformin and gluocotrol. denies polyuria, polydipsia.   HLD- on lipitor. no complaints  Rash- h/o recurrent rash on buttocks. has seen dermatology before and prescribed "expensive cream." would like it evaluated.   Current Medications (verified): 1)  Bayer Childrens Aspirin 81 Mg Chew (Aspirin) .... Take 1 Tablet By Mouth Once A Day 2)  Fexofenadine Hcl 180 Mg Tabs (Fexofenadine Hcl) .Marland Kitchen.. 1 Tab By Mouth Daily 3)  Colace 100 Mg Caps (Docusate Sodium) .... Take 1 Capsule By Mouth Twice A Day 4)  Diovan 160 Mg Tabs (Valsartan) .... One Tab By Mouth Daily 5)  Flonase 50 Mcg/act Susp (Fluticasone Propionate) .... Spray 2 Spray Into Both Nostrils Once A Day 6)  Glucotrol 10 Mg Tabs (Glipizide) .... Take 1 Tablet By Mouth Twice A Day 7)  Ibuprofen 400 Mg Tabs (Ibuprofen) .... Take 1 Tablet By Mouth Three Times A Day 8)  Lipitor 20 Mg Tabs (Atorvastatin Calcium) .... Take 1 Tablet By Mouth Every Night 9)  Norvasc 5 Mg Tabs (Amlodipine Besylate) .... Take 1 Tablet By Mouth Once A Day 10)  Prevacid 30 Mg Cpdr (Lansoprazole) .... Take 1 Capsule By Mouth Once A Day 11)  Citalopram Hydrobromide 20 Mg  Tabs (Citalopram Hydrobromide) .... 1.5 Tabs Daily 12)  Depakote Er 250 Mg  Tb24 (Divalproex Sodium) .... Qhs 13)  Ketoconazole 2  % Crea (Ketoconazole) .... Apply To Groin and Buttocks Two Times A Day 14)  Metformin Hcl 500 Mg Tabs (Metformin Hcl) .Marland Kitchen.. 1 Tab By Mouth Two Times A Day  Allergies (verified): No Known Drug Allergies  Past History:  Past medical, surgical, family and social histories (including risk factors) reviewed, and no changes noted (except as noted below).  Past Medical History: Reviewed history from 07/29/2007 and no changes required. gen phobia, inpt psych hospitalizations 1970[place] mac[place] MMSE 2/04 29/30 narcissism  paranoid schizophrenia CANDIDIASIS, SKIN (ICD-112.3) VERTIGO (ICD-780.4) SCHIZOPHRENIA (ICD-295.90) RHINITIS, ALLERGIC (ICD-477.9) OBESITY, NOS (ICD-278.00) INCONTINENCE, STRESS, FEMALE (ICD-625.6) HYPERTENSION, BENIGN SYSTEMIC (ICD-401.1) HYPERLIPIDEMIA (ICD-272.4) GOUT, UNSPECIFIED (ICD-274.9) DIABETES MELLITUS II, UNCOMPLICATED (ICD-250.00) DEPRESSIVE DISORDER, NOS (ICD-311) CATARACT (ICD-366.9) ACTINIC KERATOSIS (ICD-702.0)    Past Surgical History: Reviewed history from 07/26/2006 and no changes required. adenosine thallium - 08/07/2004 Cataract surgery - 09/22/2004 Cryotherapy (facial AK) - 05/24/2004 hysterectomy & BSO (due to endometriosis, non- -, malignant) shave bx-hyperplastic AK - 07/27/2004  Family History: Reviewed history from 06/06/2006 and no changes required. A and U (DM), C (liver and colon CA), M and F (CAD, CVA)  Social History: Reviewed history from 06/06/2006 and no changes required. Lives in [facility] (assisted living), remotely smoked and chewed tobacco. Daughter([name])  drives her, independent of all  ADL's  Physical Exam  General:  elderly female, NAD. vitals reviewed.  Eyes:  EOMI, PERRLA Mouth:  MMM Neck:  no JVD or bruits Lungs:  Normal respiratory effort, chest expands symmetrically. Lungs are clear to auscultation, no crackles or wheezes. Heart:  Normal rate and regular rhythm. S1 and S2 normal  without gallop, murmur, click, rub or other extra sounds. Abdomen:  Bowel sounds positive,abdomen soft and non-tender without masses, organomegaly or hernias noted. Extremities:  No clubbing, cyanosis, edema, or deformity noted with normal full range of motion of all joints.     Impression & Recommendations:  Problem # 1:  DIABETES MELLITUS II, UNCOMPLICATED (ICD-250.00) Assessment Unchanged A1C at goal. no changes.   Her updated medication list for this problem includes:    Bayer Childrens Aspirin 81 Mg Chew (Aspirin) .Marland Kitchen... Take 1 tablet by mouth once a day    Diovan 160 Mg Tabs (Valsartan) ..... One tab by mouth daily    Glucotrol 10 Mg Tabs (Glipizide) .Marland Kitchen... Take 1 tablet by mouth twice a day    Metformin Hcl 500 Mg Tabs (Metformin hcl) .Marland Kitchen... 1 tab by mouth two times a day  Orders: A1C-[facility] ([address]) [facility]- Est  Level 4 (60454)  Problem # 2:  HYPERTENSION, BENIGN SYSTEMIC (ICD-401.1) Assessment: Unchanged issues with hypotension in the past. will allow BP to run a little above goal.  no changse.   Her updated medication list for this problem includes:    Diovan 160 Mg Tabs (Valsartan) ..... One tab by mouth daily    Norvasc 5 Mg Tabs (Amlodipine besylate) .Marland Kitchen... Take 1 tablet by mouth once a day  Orders: Comp Met-[facility] ([id number]) [facility]- Est  Level 4 (29562)  Problem # 3:  HYPERLIPIDEMIA (ICD-272.4) Assessment: Unchanged check LDL.   Her updated medication list for this problem includes:    Lipitor 20 Mg Tabs (Atorvastatin calcium) .Marland Kitchen... Take 1 tablet by mouth every night  Orders: Direct LDL-[facility] ([id number]) [facility]- Est  Level 4 ([address]295)  Laboratory Results   Blood Tests   Date/Time Received: June 08, 2009 4:50 PM  Date/Time Reported: June 08, 2009 5:34 PM   HGBA1C: 6.2%   (Normal Range: Non-Diabetic - 3-6%   Control Diabetic - 6-8%)  Comments: ...............test performed by......Marland KitchenBonnie A. [place], MLS (ASCP)cm      Prevention & Chronic Care Immunizations    Influenza vaccine: Fluvax 3+  (02/05/2007)   Influenza vaccine due: 02/05/2008    Tetanus booster: 10/07/2001: Done.   Tetanus booster due: 10/08/2011    Pneumococcal vaccine: Done.  (04/10/1999)   Pneumococcal vaccine due: None    H. zoster vaccine: Not documented  Colorectal Screening   Hemoccult: Done.  (09/12/2004)   Hemoccult due: Not Indicated    Colonoscopy: Done.  (09/08/1998)   Colonoscopy action/deferral: Deferred  (06/08/2009)   Colonoscopy due: 09/07/2008  Other Screening   Pap smear: Done.  (04/09/1993)   Pap smear action/deferral: Not indicated S/P hysterectomy  (06/08/2009)   Pap smear due: Not Indicated    Mammogram: normal  (06/21/2008)   Mammogram due: 06/21/2009    DXA bone density scan: Done.  (10/08/2002)   DXA scan due: None    Smoking status: quit  (11/16/2008)  Diabetes Mellitus   HgbA1C: 6.2  (06/08/2009)   Hemoglobin A1C due: 10/28/2007    Eye exam: Not documented    Foot exam: Not documented   High risk foot: Not documented   Foot care education: Not documented    Urine microalbumin/creatinine ratio: Not  documented   Urine microalbumin/cr due: 07/26/2007    Diabetes flowsheet reviewed?: Yes   Progress toward A1C goal: At goal  Lipids   Total Cholesterol: 225  (12/09/2007)   LDL: 127  (12/09/2007)   LDL Direct: 89  (06/22/2008)   HDL: 40  (12/09/2007)   Triglycerides: 291  (12/09/2007)    SGOT (AST): 18  (07/26/2006)   SGPT (ALT): 18  (07/26/2006) CMP ordered    Alkaline phosphatase: 77  (07/26/2006)   Total bilirubin: 0.4  (07/26/2006)    Lipid flowsheet reviewed?: Yes   Progress toward LDL goal: At goal  Hypertension   Last Blood Pressure: 153 / 75  (06/08/2009)   Serum creatinine: 0.90  (05/23/2007)   Serum potassium 4.1  (05/23/2007) CMP ordered     Hypertension flowsheet reviewed?: Yes   Progress toward BP goal: Unchanged  Self-Management Support :   Personal Goals (by the next clinic visit) :     Personal A1C  goal: 8  (06/08/2009)     Personal blood pressure goal: 140/90  (06/08/2009)     Personal LDL goal: 100  (06/08/2009)    Patient will work on the following items until the next clinic visit to reach self-care goals:     Medications and monitoring: take my medicines every day  (06/08/2009)     Eating: eat foods that are low in salt, eat baked foods instead of fried foods  (06/08/2009)     Activity: take a 30 minute walk every day  (06/08/2009)    Diabetes self-management support: Not documented    Diabetes self-management support not done because: Good outcomes  (06/08/2009)    Hypertension self-management support: Written self-care plan  (06/08/2009)   Hypertension self-care plan printed.    Lipid self-management support: Not documented     Lipid self-management support not done because: Good outcomes  (06/08/2009)

## 2010-05-11 NOTE — Progress Notes (Signed)
  Phone Note Call from Patient   Caller: Daughter Call For: Debra Asal  MD Summary of Call: Daughter called regarding home visit next week.  She requested that if there are any concerns or info Ms. Despain need to know, to please call the daughter instead because Ms. Orvan Falconer won't remember.  She can be reached at [phone].  Leave msg if she doesn't answer. Initial call taken by: Abundio Miu

## 2010-05-11 NOTE — Assessment & Plan Note (Signed)
Summary: SUMMARY   Vital Signs:  Patient profile:   77 year old female Weight:      181.9 pounds BMI:     35.07 Temp:     98.1 degrees F oral Pulse rate:   69 / minute Pulse rhythm:   regular BP sitting:   160 / 73  (left arm) Cuff size:   large  Vitals Entered By: Debra Tran CMA (September 20, 2009 9:15 AM)  Nutrition Counseling: Patient's BMI is greater than 25 and therefore counseled on weight management options.  Primary Care Provider:  Lequita Asal  MD  CC:  f/u DM and HTN.  History of Present Illness: HTN- on diovan and norvasc. denies chest pain, SOB, headaches. +peripheral edema  DM- fasting CBGs mostly 100s per records from assisted living. on metformin and gluocotrol. denies polyuria, polydipsia.   Arthritis- worsening daily pain. on scheduled ibuprofen with as needed tylenol and ultram which she has not been asking for. patient with recent fall causing fracture of L elbow and requiring sling/brace. has f/u with ortho tomorrow  calluses of feet- seen by podiatry q3 months. has painful callus on foot. inquiring about getting it shaved.   Habits & Providers  Alcohol-Tobacco-Diet     Tobacco Status: quit     Tobacco Counseling: to remain off tobacco products  -  Date:  08/24/2009    Mammogram normal bx after abnormal mammogram  Current Medications (verified): 1)  Bayer Childrens Aspirin 81 Mg Chew (Aspirin) .... Take 1 Tablet By Mouth Once A Day 2)  Fexofenadine Hcl 180 Mg Tabs (Fexofenadine Hcl) .Marland Kitchen.. 1 Tab By Mouth Daily 3)  Colace 100 Mg Caps (Docusate Sodium) .... Take 1 Capsule By Mouth Twice A Day 4)  Diovan 160 Mg Tabs (Valsartan) .... One Tab By Mouth Daily 5)  Flonase 50 Mcg/act Susp (Fluticasone Propionate) .... Spray 2 Spray Into Both Nostrils Once A Day 6)  Glucotrol 10 Mg Tabs (Glipizide) .... Take 1 Tablet By Mouth Twice A Day 7)  Ibuprofen 400 Mg Tabs (Ibuprofen) .... Take 1 Tablet By Mouth Three Times A Day 8)  Lipitor 20 Mg Tabs (Atorvastatin  Calcium) .... Take 1 Tablet By Mouth Every Night 9)  Norvasc 5 Mg Tabs (Amlodipine Besylate) .... Take 1 Tablet By Mouth Once A Day 10)  Prevacid 30 Mg Cpdr (Lansoprazole) .... Take 1 Capsule By Mouth Once A Day 11)  Citalopram Hydrobromide 20 Mg  Tabs (Citalopram Hydrobromide) .... 1.5 Tabs Daily 12)  Depakote Er 250 Mg  Tb24 (Divalproex Sodium) .... One Tab By Mouth Qhs 13)  Ketoconazole 2 % Crea (Ketoconazole) .... Apply To Groin and Buttocks Two Times A Day 14)  Metformin Hcl 500 Mg Tabs (Metformin Hcl) .Marland Kitchen.. 1 Tab By Mouth Two Times A Day 15)  Acetaminophen 650 Mg Cr-Tabs (Acetaminophen) .... One Tab By Mouth Q6 Hours Scheduled X1 Week, Then Q6 Hours As Needed For Pain 16)  Multivitamins  Tabs (Multiple Vitamin) .... One Tab By Mouth Daily 17)  Oscal 500/200 D-3 500-200 Mg-Unit Tabs (Calcium-Vitamin D) .... One Tab By Mouth Bid 18)  Ultram 50 Mg Tabs (Tramadol Hcl) .... One Tab By Mouth Q8 Hours Scheduled X7 Days, Then Q8 Hours As Needed For Pain.  Allergies (verified): No Known Drug Allergies  Past History:  Past medical, surgical, family and social histories (including risk factors) reviewed, and no changes noted (except as noted below).  Past Medical History: Reviewed history from 07/29/2007 and no changes required. 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 adenosine thallium - 08/07/2004 Cataract  surgery - 09/22/2004 Cryotherapy (facial AK) - 05/24/2004 hysterectomy & BSO (due to endometriosis, non- -, malignant) shave bx-hyperplastic AK - 07/27/2004  Family History: Reviewed history from 06/06/2006 and no changes required. A and U (DM), C (liver and colon CA), M and F (CAD, CVA)  Social History: Reviewed history from 06/06/2006 and no changes required. Lives in [facility] (assisted living), remotely smoked and chewed tobacco. Daughter(Debra Tran)  drives her, independent of all ADL's  Physical Exam  General:  elderly female, NAD. vitals reviewed.  Mouth:  MMM Lungs:  Normal respiratory effort, chest expands symmetrically. Lungs are clear to auscultation, no crackles or wheezes. Heart:  Normal rate and regular rhythm. S1 and S2 normal without gallop, murmur, click, rub or other extra sounds. Extremities:  3+ pitting edema of BLE to knees Neurologic:  nonfocal Psych:  normally interactive.     Impression & Recommendations:  Problem # 1:  DIABETES MELLITUS II, UNCOMPLICATED (ICD-250.00) Assessment Unchanged A1C <6. no episodes of hypoglycemia noted on records or reported by facility. consider decreasing or d/c'ing glucotrol if patient begins to have problems with hypoglycemia.   The following medications were removed from the medication list:    Diovan 160 Mg Tabs (Valsartan) ..... One tab by mouth daily Her updated medication list for this problem includes:    Bayer Childrens Aspirin 81 Mg Chew (Aspirin) .Marland Kitchen... Take 1 tablet by mouth once a day    Glucotrol 10 Mg Tabs (Glipizide) .Marland Kitchen... Take 1 tablet by mouth twice a day    Metformin Hcl 500 Mg Tabs (Metformin hcl) .Marland Kitchen... 1 tab by mouth two times a day    Diovan Hct 160-12.5 Mg Tabs (Valsartan-hydrochlorothiazide) ..... One tab by mouth q.a.m.  Orders: A1C-[facility] (04540) [facility]- Est  Level 4 (98119)  Labs Reviewed: Creat: 0.96 (06/08/2009)   Microalbumin: NEGATIVE (07/26/2006) Reviewed HgBA1c results: 5.8 (09/20/2009)  6.2  (06/08/2009)  Problem # 2:  HYPERTENSION, BENIGN SYSTEMIC (ICD-401.1) Assessment: Unchanged  at goal. issue with hypoglycemia in the past, and so HCTZ was d/c'ed. given peripheral edema, will d/c amlodipine which could be contributing and restart HCTZ in combination with valsartan.   The following medications were removed from the medication list:    Diovan 160 Mg Tabs (Valsartan) ..... One tab by mouth daily    Norvasc 5 Mg Tabs (Amlodipine besylate) .Marland Kitchen... Take 1 tablet by mouth once a day Her updated medication list for this problem includes:    Diovan Hct 160-12.5 Mg Tabs (Valsartan-hydrochlorothiazide) ..... One tab by mouth q.a.m.  Orders: Basic Met-[facility] ([phone]) [facility]- Est  Level 4 (99214)  BP today: 160/73 Prior BP: 153/75 (06/08/2009)  Labs Reviewed: K+: 4.2 (06/08/2009) Creat: : 0.96 (06/08/2009)   Chol: 225 (12/09/2007)   HDL: 40 (12/09/2007)   LDL: 127 (12/09/2007)   TG: 291 (12/09/2007)  Problem # 3:  ARTHRITIS (ICD-716.90) Assessment: New  worsening pain, recent fall. will continue scheduled ibuprofen, change acetaminophen to scheduled and give scheduled ultram at bedtime with an as needed dose available for in the mornings.   Orders: [facility]- Est  Level 4 (30865)  Problem # 4:  HYPERLIPIDEMIA (ICD-272.4) Assessment: Comment Only  direct LDL 72 in March 2011.   Her updated medication list for this problem includes:    Lipitor 20 Mg Tabs (Atorvastatin calcium) .Marland Kitchen... Take 1 tablet by mouth every night  Labs Reviewed: SGOT: 18 (06/08/2009)   SGPT: 15 (06/08/2009)   HDL:40 (12/09/2007), 40 (07/26/2006)  LDL:127 (12/09/2007), 54 (14/78/2956)  Chol:225 (12/09/2007), 135 (07/26/2006)  Trig:291 (12/09/2007), 203 (07/26/2006)  Problem # 5:  SCHIZOPHRENIA (ICD-295.90) Assessment: Comment Only managed by [facility]. not on any antipsychotic meds.   Problem # 6:  RHINITIS, ALLERGIC (ICD-477.9) Assessment: Comment Only flonase, fexofenadine  Her updated  medication list for this problem includes:    Fexofenadine Hcl 180 Mg Tabs (Fexofenadine hcl) .Marland Kitchen... 1 tab by mouth daily    Flonase 50 Mcg/act Susp (Fluticasone propionate) ..... Spray 2 spray into both nostrils once a day  Problem # 7:  DEPRESSIVE DISORDER, NOS (ICD-311) Assessment: Comment Only  citalopram  Her updated medication list for this problem includes:    Citalopram Hydrobromide 20 Mg Tabs (Citalopram hydrobromide) .Marland Kitchen... 1.5 tabs daily  Problem # 8:  CORNS AND CALLOSITIES (ICD-700) Assessment: Comment Only no time to address today. encouraged to follow up with podiatry or schedule a separate appt  Patient Instructions: 1)  Follow up in 3 months. Prescriptions: DIOVAN HCT 160-12.5 MG TABS (VALSARTAN-HYDROCHLOROTHIAZIDE) one tab by mouth q.a.m.  #30 x 5   Entered and Authorized by:   Debra Asal  MD   Signed by:   Debra Asal  MD on 09/20/2009   Method used:   Electronically to        [facility]* (retail)       [address] [place], [place]  [address]       Ph: [phone]       Fax: [phone]   RxID:   [id number]   Laboratory Results   Blood Tests   Date/Time Received: September 20, 2009 9:28 AM  Date/Time Reported: September 20, 2009 10:16 AM   HGBA1C: 5.8%   (Normal Range: Non-Diabetic - 3-6%   Control Diabetic - 6-8%)  Comments: ...........test performed by...........Marland KitchenTerese Door, CMA      Prevention & Chronic Care Immunizations   Influenza vaccine: Fluvax 3+  (02/05/2007)   Influenza vaccine deferral: Not available  (09/20/2009)   Influenza vaccine due: 02/05/2008    Tetanus booster: 10/07/2001: Done.   Tetanus booster due: 10/08/2011    Pneumococcal vaccine: Done.  (04/10/1999)   Pneumococcal vaccine deferral: Not indicated  (09/20/2009)   Pneumococcal vaccine due: None    H. zoster vaccine: Not documented  Colorectal Screening   Hemoccult: Done.  (09/12/2004)   Hemoccult due: Not Indicated    Colonoscopy:  Done.  (09/08/1998)   Colonoscopy action/deferral: Deferred  (06/08/2009)   Colonoscopy due: 09/07/2008  Other Screening   Pap smear: Done.  (04/09/1993)   Pap smear action/deferral: Not indicated S/P hysterectomy  (06/08/2009)   Pap smear due: Not Indicated    Mammogram: normal bx after abnormal mammogram  (08/24/2009)   Mammogram due: 08/25/2010    DXA bone density scan: Done.  (10/08/2002)   DXA scan due: None    Smoking status: quit  (09/20/2009)  Diabetes Mellitus   HgbA1C: 5.8  (09/20/2009)   Hemoglobin A1C due: 10/28/2007    Eye exam: Not documented    Foot exam: Not documented   High risk foot: Not documented   Foot care  education: Not documented    Urine microalbumin/creatinine ratio: Not documented   Urine microalbumin/cr due: 07/26/2007    Diabetes flowsheet reviewed?: Yes   Progress toward A1C goal: At goal  Lipids   Total Cholesterol: 225  (12/09/2007)   LDL: 127  (12/09/2007)   LDL Direct: 72  (06/08/2009)   HDL: 40  (12/09/2007)   Triglycerides: 291  (12/09/2007)    SGOT (AST): 18  (06/08/2009)   SGPT (ALT): 15  (06/08/2009)   Alkaline phosphatase: 73  (06/08/2009)   Total bilirubin: 0.2  (06/08/2009)    Lipid flowsheet reviewed?: Yes   Progress toward LDL goal: At goal  Hypertension   Last Blood Pressure: 160 / 73  (09/20/2009)   Serum creatinine: 0.96  (06/08/2009)   Serum potassium 4.2  (06/08/2009)    Hypertension flowsheet reviewed?: Yes   Progress toward BP goal: Unchanged  Self-Management Support :   Personal Goals (by the next clinic visit) :     Personal A1C goal: 8  (06/08/2009)     Personal blood pressure goal: 140/90  (06/08/2009)     Personal LDL goal: 100  (06/08/2009)    Diabetes self-management support: Not documented    Diabetes self-management support not done because: Good outcomes  (06/08/2009)    Hypertension self-management support: Written self-care plan  (06/08/2009)    Lipid self-management support: Not  documented     Lipid self-management support not done because: Good outcomes  (09/20/2009)

## 2010-05-11 NOTE — Progress Notes (Signed)
     Follow-up for Phone Call       Follow-up by: Lloyd Huger MD,  December 29, 2009 4:28 PM    Additional Follow-up for Phone Call Additional follow up Details #2::    got refill request for tramadol. not on med list. plz advise-add & refill if ok Follow-up by: Golden Circle RN,  December 29, 2009 4:10 PM  Additional Follow-up for Phone Call Additional follow up Details #3:: Details for Additional Follow-up Action Taken: Will refill since she has taken this in the past. Would like patient to schedule appointment to be seen in the next 1-2 months.  Additional Follow-up by: Lloyd Huger MD,  December 29, 2009 4:28 PM  New/Updated Medications: TRAMADOL HCL 50 MG TABS (TRAMADOL HCL) Take one by mouth q 8 hr as needed for pain. Prescriptions: TRAMADOL HCL 50 MG TABS (TRAMADOL HCL) Take one by mouth q 8 hr as needed for pain.  #60 x 0   Entered and Authorized by:   Lloyd Huger MD   Signed by:   Lloyd Huger MD on 12/29/2009   Method used:   Electronically to        [facility]* (retail)       [address] [place], [place]  [address]       Ph: [phone]       Fax: [phone]   RxID:   [id number]

## 2010-05-11 NOTE — Miscellaneous (Signed)
  Clinical Lists Changes  Medications: Changed medication from ACETAMINOPHEN 650 MG CR-TABS (ACETAMINOPHEN) one tab by mouth q6 hours as needed pain to ACETAMINOPHEN 650 MG CR-TABS (ACETAMINOPHEN) one tab by mouth q6 hours scheduled x1 week, then q6 hours as needed for pain Added new medication of ULTRAM 50 MG TABS (TRAMADOL HCL) one tab by mouth q8 hours scheduled x7 days, then q8 hours as needed for pain. - Signed Rx of ULTRAM 50 MG TABS (TRAMADOL HCL) one tab by mouth q8 hours scheduled x7 days, then q8 hours as needed for pain.;  #120 x 1;  Signed;  Entered by: Lequita Asal  MD;  Authorized by: Lequita Asal  MD;  Method used: Electronically to [facility]*, [address] [place], [place], [place]  [address], Ph: [phone], Fax: [phone]    Prescriptions: ULTRAM 50 MG TABS (TRAMADOL HCL) one tab by mouth q8 hours scheduled x7 days, then q8 hours as needed for pain.  #120 x 1   Entered and Authorized by:   Lequita Asal  MD   Signed by:   Lequita Asal  MD on 08/08/2009   Method used:   Electronically to        [facility]* (retail)       [address] [place], [place]  [address]       Ph: [phone]       Fax: [phone]   RxID:   [id number]

## 2010-05-11 NOTE — Miscellaneous (Signed)
Summary: Orders Update  D/C Glucotrol completely. Decrease Metformin to 500 mg by mouth once a day.   Lequita Asal  MD  September 21, 2009 5:38 AM

## 2010-05-11 NOTE — Progress Notes (Signed)
Summary: Order to D/C Os[facility]  Phone Note Other Incoming   Caller: Michaelyn Barter with [facility] [phone] Summary of Call: Requesting an order to D/C patients [facility] (OTC).  States daughter Clent Ridges buys it for the patient but just hasn't been bringing it in lately. They have an order for patient to take one daily.    Fax order to [phone] ATTN: Jacinto Reap order given to discontinue Oscal.  Will have Dr. Cristal Ford to send written order over when she is back in clinic. Initial call taken by: Terese Door,  May 03, 2010 5:26 PM  Follow-up for Phone Call        Ok to DC oscal. Will sign and fax as neccessary. Follow-up by: Lloyd Huger MD,  May 04, 2010 11:35 AM

## 2010-05-17 NOTE — Progress Notes (Signed)
Summary: FMLA  Phone Note Call from Patient Call back at [phone]   Caller: Daughter Summary of Call: pts daughter is checking status of fmla papers, they have to filled out today, this is only for intermittant fmla so she can bring pt to her appts & not lose her job.  Initial call taken by: Knox Royalty,  May 11, 2010 2:02 PM  Follow-up for Phone Call        Completed and ready for scan into system and pick up. Follow-up by: Lloyd Huger MD,  May 11, 2010 4:58 PM     Appended Document: FMLA LVM for pt's daughter to inform of FMLA papers ready to be picked up at the front

## 2010-06-15 ENCOUNTER — Ambulatory Visit (INDEPENDENT_AMBULATORY_CARE_PROVIDER_SITE_OTHER): Payer: Medicare Other | Admitting: Family Medicine

## 2010-06-15 ENCOUNTER — Encounter: Payer: Self-pay | Admitting: Family Medicine

## 2010-06-15 VITALS — BP 210/70 | HR 85 | Temp 98.0°F | Ht 61.0 in | Wt 177.0 lb

## 2010-06-15 DIAGNOSIS — I10 Essential (primary) hypertension: Secondary | ICD-10-CM

## 2010-06-15 DIAGNOSIS — K3189 Other diseases of stomach and duodenum: Secondary | ICD-10-CM

## 2010-06-15 DIAGNOSIS — E119 Type 2 diabetes mellitus without complications: Secondary | ICD-10-CM

## 2010-06-15 DIAGNOSIS — K30 Functional dyspepsia: Secondary | ICD-10-CM

## 2010-06-15 LAB — POCT GLYCOSYLATED HEMOGLOBIN (HGB A1C): Hemoglobin A1C: 7.7

## 2010-06-15 NOTE — Patient Instructions (Signed)
Nice to see you again. Take an extra dose of your Diovan today.  Take this medication in the am and evening. Schedule an appointment in one week. Cut down on your salt, caffeine intake. Avoid taking motrin.

## 2010-06-15 NOTE — Assessment & Plan Note (Signed)
Reported fasting CBGs >200 recently. Last a1c 7.3 from 5.8. Will increase metformin to BID. Has regular ophthalmology follow-up. Will re-assess and check BMP at next visit.

## 2010-06-15 NOTE — Assessment & Plan Note (Signed)
Unsure of cause to severe systolic hypertension today. Possibly dietary vs. NSAID use. Recent baseline has been consistently 140-150 systolic.  No signs of end-organ damage (visual, CP, dyspnea). Recommend extra dose of diovan this pm, and to start this medication BID currently. ALF to check BP daily x5 days and to call office for pressures >180/100 or <100/50. Will return to once daily dose if hypotensive. Follow up in 1 week to reassess antihypertensive regimen. Counseled to avoid excess sodium and caffeine.

## 2010-06-15 NOTE — Assessment & Plan Note (Signed)
Symptoms of GERD worsened with lying flat. Recommend decrease in caffeine intake, especially before bedtime. Has PPI on old med list, but not on MAR provided by [facility]. Will start generic omeprazole today.

## 2010-06-15 NOTE — Progress Notes (Signed)
  Subjective:    Patient ID: Debra Tran, female    DOB: Oct 17, 1927, 77 y.o.   MRN: [id number]  HPI 1. HTN. Has been taking medication daily as prescribed, administered by assistants at ALF. Last measurement 155/51. They take BP on a weekly basis. This is consistent with latest clinic measurements. No abnormal dietary consumption today, ate some potatoes. Drinks several cups of coffee daily. Feels some head "pressure" similar to sinus congestion. Denies visual changes, splitting HA, confusion, increase in baseline dyspnea, edema, abdominal pain or any other pain. Had one previous episode of severe hypertension, SBP >200 and had an ED visit last year, but controlled since then.   2. DM. Last a1c had increased by >1point. Taking metformin once daily. CBGs recently elevated as reported by ALF. Endorses stress incontinence and nocturia, but no perceived polyuria.   3. Indigestion. Feels a burning, gurgling sensation in epigastrium when she lies down at night. Drinks 4-5 cups caffeinated coffee daily. Symptoms improve with tums. MAR does not include a PPI even though we have prescribed prevacid. Patient's daughter requests a generic form if possible. Denies nausea/vomitting, current abdominal pain, chest pain.    Review of Systems See HPI.     Objective:   Physical Exam  Constitutional: She is oriented to person, place, and time. She appears well-developed and well-nourished. No distress.  HENT:  Head: Normocephalic and atraumatic.  Mouth/Throat: Oropharynx is clear and moist. No oropharyngeal exudate.       Fundoscopic exam limited due to pupillary constriction, no hemorrhages detected.   Eyes: EOM are normal. Pupils are equal, round, and reactive to light.  Neck: Neck supple.  Cardiovascular: Normal rate and regular rhythm.   Murmur heard.      II/VI systolic ejection murmur  Pulmonary/Chest: Effort normal and breath sounds normal. No respiratory distress. She has no wheezes. She has no  rales.  Abdominal: Soft. She exhibits no distension. There is no tenderness. There is no guarding.  Musculoskeletal: She exhibits edema.       1+ bilateral LE pitting edema. Nontender.   Neurological: She is alert and oriented to person, place, and time. No cranial nerve deficit. Coordination normal.  Skin: Skin is warm and dry. She is not diaphoretic.  Psychiatric: She has a normal mood and affect. Her behavior is normal. Thought content normal.          Assessment & Plan:

## 2010-06-20 ENCOUNTER — Telehealth: Payer: Self-pay | Admitting: Family Medicine

## 2010-06-20 NOTE — Telephone Encounter (Signed)
Mrs. Tye daughter called to ask that you check to see if you can see her next week on the 20th.  You only have same day slot available.  If not she would like to have her seen in the [facility] for as late in the m orning as sched allow.  As you know we can't use the very last slot due to Dr. Sheffield Slider using that for only home visits.  Let me know what to do.  You can call her at the numbers listed below and if she is not home lv. msg.

## 2010-06-22 NOTE — Telephone Encounter (Signed)
OK to double book patient on 06/27/10. I called patient's daughter and left message stating this. She is to call for official appointment time.

## 2010-06-25 LAB — URINALYSIS, ROUTINE W REFLEX MICROSCOPIC
Bilirubin Urine: NEGATIVE
Glucose, UA: NEGATIVE mg/dL
Hgb urine dipstick: NEGATIVE
Ketones, ur: NEGATIVE mg/dL
Nitrite: NEGATIVE
Protein, ur: NEGATIVE mg/dL
Specific Gravity, Urine: 1.007 (ref 1.005–1.030)
Urobilinogen, UA: 0.2 mg/dL (ref 0.0–1.0)
pH: 6.5 (ref 5.0–8.0)

## 2010-06-25 LAB — CBC
Hemoglobin: 10.5 g/dL — ABNORMAL LOW (ref 12.0–15.0)
MCH: 25.7 pg — ABNORMAL LOW (ref 26.0–34.0)
MCV: 80.7 fL (ref 78.0–100.0)
Platelets: 205 10*3/uL (ref 150–400)
RBC: 4.08 MIL/uL (ref 3.87–5.11)
WBC: 6.8 10*3/uL (ref 4.0–10.5)

## 2010-06-25 LAB — POCT I-STAT, CHEM 8
Calcium, Ion: 1.17 mmol/L (ref 1.12–1.32)
HCT: 35 % — ABNORMAL LOW (ref 36.0–46.0)
Hemoglobin: 11.9 g/dL — ABNORMAL LOW (ref 12.0–15.0)
Sodium: 137 mEq/L (ref 135–145)
TCO2: 31 mmol/L (ref 0–100)

## 2010-06-25 LAB — COMPREHENSIVE METABOLIC PANEL
AST: 19 U/L (ref 0–37)
Albumin: 3.4 g/dL — ABNORMAL LOW (ref 3.5–5.2)
Alkaline Phosphatase: 65 U/L (ref 39–117)
CO2: 30 mEq/L (ref 19–32)
Chloride: 100 mEq/L (ref 96–112)
GFR calc Af Amer: >60 mL/min (ref >60)
GFR calc non Af Amer: 53 mL/min — ABNORMAL LOW (ref >60)
Potassium: 4.1 mEq/L (ref 3.5–5.1)
Total Bilirubin: 0.4 mg/dL (ref 0.3–1.2)

## 2010-06-25 LAB — GLUCOSE, CAPILLARY: Glucose-Capillary: 151 mg/dL — ABNORMAL HIGH (ref 70–99)

## 2010-06-25 LAB — DIFFERENTIAL
Basophils Absolute: 0 10*3/uL (ref 0.0–0.1)
Basophils Relative: 0 % (ref 0–1)
Eosinophils Absolute: 0.1 10*3/uL (ref 0.0–0.7)
Eosinophils Relative: 1 % (ref 0–5)
Monocytes Absolute: 0.4 10*3/uL (ref 0.1–1.0)

## 2010-06-27 ENCOUNTER — Encounter: Payer: Self-pay | Admitting: Family Medicine

## 2010-06-27 ENCOUNTER — Ambulatory Visit (INDEPENDENT_AMBULATORY_CARE_PROVIDER_SITE_OTHER): Payer: Medicare Other | Admitting: Family Medicine

## 2010-06-27 VITALS — BP 175/73 | HR 67 | Temp 98.4°F | Ht 61.0 in | Wt 168.6 lb

## 2010-06-27 DIAGNOSIS — E785 Hyperlipidemia, unspecified: Secondary | ICD-10-CM

## 2010-06-27 DIAGNOSIS — I10 Essential (primary) hypertension: Secondary | ICD-10-CM

## 2010-06-27 LAB — GLUCOSE, CAPILLARY: Glucose-Capillary: 76 mg/dL (ref 70–99)

## 2010-06-27 NOTE — Patient Instructions (Addendum)
Nice to see you again. Continue taking medication for BP as scheduled. Please schedule lab draw in the morning sometime this week. Follow up appointment in one month. If we need to make changes, I will call.

## 2010-06-29 NOTE — Progress Notes (Signed)
  Subjective:    Patient ID: Debra Tran, female    DOB: Jun 21, 1927, 77 y.o.   MRN: [id number]  HPI 1. HTN. Following up recent uncontrolled systolic hypertension. Was 210/110 in clinic 2 weeks ago. Has been taking Diovan BID as instructed, and ALF staff are checking BP daily. Values range from 121-193 systolic and 55-79 diastolic. Most values 150-160s/70s. Motrin discontinued. Weight has not changed.   Daughter brings patient to clinic, asks for consideration of HLA forms to be changed due to frequency of visits lately.   Review of Systems Normal urine output, no dizzyness, lightheadedness, HAs, visual changes, fatigue.     Objective:   Physical Exam  Vitals reviewed. Constitutional: She is oriented to person, place, and time. She appears well-developed and well-nourished. No distress.  HENT:  Head: Normocephalic.  Mouth/Throat: Oropharynx is clear and moist.  Eyes: EOM are normal. Pupils are equal, round, and reactive to light.  Pulmonary/Chest: Effort normal and breath sounds normal. No respiratory distress.  Musculoskeletal:       1+ bilateral LE edema symmetric.   Neurological: She is alert and oriented to person, place, and time. She exhibits normal muscle tone. Coordination normal.  Skin: She is not diaphoretic.  Psychiatric: She has a normal mood and affect. Her behavior is normal.          Assessment & Plan:

## 2010-06-29 NOTE — Assessment & Plan Note (Signed)
FLP ordered for next am lab draw. Stable on statin currently.

## 2010-06-29 NOTE — Assessment & Plan Note (Signed)
Isolated systolic hypertension in elderly. Goal is <160/90 and values are very near this range currently. Will continue this regimen with increase ACEi. Will obtain BMET to eval creatinine, lab draw in one week since lab is closed today. May consider low dose beta blocker if needed. F/u in one month.

## 2010-06-30 ENCOUNTER — Other Ambulatory Visit: Payer: Self-pay | Admitting: Family Medicine

## 2010-06-30 ENCOUNTER — Other Ambulatory Visit: Payer: Medicare Other

## 2010-06-30 DIAGNOSIS — E785 Hyperlipidemia, unspecified: Secondary | ICD-10-CM

## 2010-06-30 LAB — LIPID PANEL: Total CHOL/HDL Ratio: 3.7 Ratio

## 2010-06-30 NOTE — Progress Notes (Signed)
Drew pt for a fasting lipid. 1sst. AC 

## 2010-07-04 LAB — BASIC METABOLIC PANEL
CO2: 27 mEq/L (ref 19–32)
Chloride: 99 mEq/L (ref 96–112)
Glucose, Bld: 149 mg/dL — ABNORMAL HIGH (ref 70–99)
Potassium: 4 mEq/L (ref 3.5–5.3)
Sodium: 141 mEq/L (ref 135–145)

## 2010-07-04 LAB — TSH: TSH: 1.271 u[IU]/mL (ref 0.350–4.500)

## 2010-07-13 ENCOUNTER — Ambulatory Visit: Payer: Medicare Other | Admitting: Family Medicine

## 2010-08-12 ENCOUNTER — Emergency Department (HOSPITAL_COMMUNITY): Payer: Medicare Other

## 2010-08-12 ENCOUNTER — Emergency Department (HOSPITAL_COMMUNITY)
Admission: EM | Admit: 2010-08-12 | Discharge: 2010-08-13 | Disposition: A | Discharge status: Home / Self Care | Payer: Medicare Other | Attending: Emergency Medicine | Admitting: Emergency Medicine

## 2010-08-12 DIAGNOSIS — M25559 Pain in unspecified hip: Secondary | ICD-10-CM | POA: Insufficient documentation

## 2010-08-12 DIAGNOSIS — I10 Essential (primary) hypertension: Secondary | ICD-10-CM | POA: Insufficient documentation

## 2010-08-12 DIAGNOSIS — Y9289 Other specified places as the place of occurrence of the external cause: Secondary | ICD-10-CM | POA: Insufficient documentation

## 2010-08-12 DIAGNOSIS — E785 Hyperlipidemia, unspecified: Secondary | ICD-10-CM | POA: Insufficient documentation

## 2010-08-12 DIAGNOSIS — E119 Type 2 diabetes mellitus without complications: Secondary | ICD-10-CM | POA: Insufficient documentation

## 2010-08-12 DIAGNOSIS — W010XXA Fall on same level from slipping, tripping and stumbling without subsequent striking against object, initial encounter: Secondary | ICD-10-CM | POA: Insufficient documentation

## 2010-08-12 DIAGNOSIS — M25529 Pain in unspecified elbow: Secondary | ICD-10-CM | POA: Insufficient documentation

## 2010-08-12 DIAGNOSIS — F0390 Unspecified dementia without behavioral disturbance: Secondary | ICD-10-CM | POA: Insufficient documentation

## 2010-08-12 DIAGNOSIS — IMO0002 Reserved for concepts with insufficient information to code with codable children: Secondary | ICD-10-CM | POA: Insufficient documentation

## 2010-08-12 DIAGNOSIS — R569 Unspecified convulsions: Secondary | ICD-10-CM | POA: Insufficient documentation

## 2010-08-12 LAB — GLUCOSE, CAPILLARY

## 2010-08-14 ENCOUNTER — Encounter: Payer: Self-pay | Admitting: Home Health Services

## 2010-09-26 ENCOUNTER — Telehealth: Payer: Self-pay | Admitting: Family Medicine

## 2010-09-26 NOTE — Telephone Encounter (Signed)
Daughter calling about order faxed several months ago for request for diabetic shoes.  Want to know if physician ever received the request and when she will be faxing back to company.     Daughter said that she can be contacted at number given and left a msg if she is not available.  Also will have form refaxed if provider never received original

## 2010-09-26 NOTE — Telephone Encounter (Signed)
Did you ever get this request???

## 2010-09-27 NOTE — Telephone Encounter (Signed)
If I received it then, I approved. I just received a new request and completed this document. Ready to be faxed to [facility]. Please inform her of completion.

## 2010-09-27 NOTE — Telephone Encounter (Signed)
LVM for daughter Debra Tran) to call back to inform of below

## 2010-10-02 NOTE — Telephone Encounter (Signed)
LVM for daughter [name]) to call back

## 2010-10-04 NOTE — Telephone Encounter (Signed)
Closing chart due to attempting to reach daughter of patient and patient numerous times

## 2010-10-12 ENCOUNTER — Other Ambulatory Visit: Payer: Self-pay | Admitting: Family Medicine

## 2010-10-12 DIAGNOSIS — E785 Hyperlipidemia, unspecified: Secondary | ICD-10-CM

## 2010-10-12 MED ORDER — ATORVASTATIN CALCIUM 20 MG PO TABS: 20.0000 mg | ORAL_TABLET | Freq: Every day | ORAL | Status: DC

## 2010-11-16 ENCOUNTER — Encounter: Payer: Self-pay | Admitting: Family Medicine

## 2011-01-19 ENCOUNTER — Other Ambulatory Visit: Payer: Self-pay | Admitting: Family Medicine

## 2011-01-19 MED ORDER — CITALOPRAM HYDROBROMIDE 20 MG PO TABS: 30.0000 mg | ORAL_TABLET | Freq: Every day | ORAL | Status: DC

## 2011-03-12 ENCOUNTER — Ambulatory Visit (INDEPENDENT_AMBULATORY_CARE_PROVIDER_SITE_OTHER): Payer: Medicare Other | Admitting: Family Medicine

## 2011-03-12 ENCOUNTER — Ambulatory Visit: Payer: Medicare Other | Admitting: Family Medicine

## 2011-03-12 ENCOUNTER — Encounter: Payer: Self-pay | Admitting: Family Medicine

## 2011-03-12 VITALS — BP 148/67 | HR 66 | Temp 97.4°F | Ht 60.0 in | Wt 168.0 lb

## 2011-03-12 DIAGNOSIS — M799 Soft tissue disorder, unspecified: Secondary | ICD-10-CM

## 2011-03-12 DIAGNOSIS — R1013 Epigastric pain: Secondary | ICD-10-CM

## 2011-03-12 DIAGNOSIS — M949 Disorder of cartilage, unspecified: Secondary | ICD-10-CM

## 2011-03-12 DIAGNOSIS — R29818 Other symptoms and signs involving the nervous system: Secondary | ICD-10-CM

## 2011-03-12 DIAGNOSIS — R269 Unspecified abnormalities of gait and mobility: Secondary | ICD-10-CM

## 2011-03-12 DIAGNOSIS — F329 Major depressive disorder, single episode, unspecified: Secondary | ICD-10-CM

## 2011-03-12 DIAGNOSIS — R296 Repeated falls: Secondary | ICD-10-CM | POA: Insufficient documentation

## 2011-03-12 DIAGNOSIS — M899 Disorder of bone, unspecified: Secondary | ICD-10-CM

## 2011-03-12 DIAGNOSIS — E119 Type 2 diabetes mellitus without complications: Secondary | ICD-10-CM

## 2011-03-12 DIAGNOSIS — K30 Functional dyspepsia: Secondary | ICD-10-CM

## 2011-03-12 DIAGNOSIS — F3289 Other specified depressive episodes: Secondary | ICD-10-CM

## 2011-03-12 DIAGNOSIS — I10 Essential (primary) hypertension: Secondary | ICD-10-CM

## 2011-03-12 LAB — CBC
Hemoglobin: 11.2 g/dL — ABNORMAL LOW (ref 12.0–15.0)
MCHC: 31.8 g/dL (ref 30.0–36.0)
RDW: 14.5 % (ref 11.5–15.5)
WBC: 7.6 10*3/uL (ref 4.0–10.5)

## 2011-03-12 LAB — BASIC METABOLIC PANEL
Chloride: 98 mEq/L (ref 96–112)
Glucose, Bld: 140 mg/dL — ABNORMAL HIGH (ref 70–99)
Potassium: 4.1 mEq/L (ref 3.5–5.3)
Sodium: 136 mEq/L (ref 135–145)

## 2011-03-12 LAB — POCT GLYCOSYLATED HEMOGLOBIN (HGB A1C): Hemoglobin A1C: 7.2

## 2011-03-12 NOTE — Patient Instructions (Signed)
Your A1c is improved to 7.2! Your BP is good today. Sometimes your BP medication (valsartan) can cause a chronic cough, if this continues then we may need to try switching medication. Will check labs for your fatigue and call with results. OK to get zostavax (shingles vaccine), have given you a prescription as we don't have this in the clinic. Make an appointment in 3 months for check up.  Will make physical therapy referral to evaluate your strength, prevent falls.

## 2011-03-13 ENCOUNTER — Encounter: Payer: Self-pay | Admitting: Family Medicine

## 2011-03-13 LAB — VITAMIN D 25 HYDROXY (VIT D DEFICIENCY, FRACTURES): Vit D, 25-Hydroxy: 45 ng/mL (ref 30–89)

## 2011-03-13 NOTE — Progress Notes (Signed)
  Subjective:    Patient ID: Debra Tran, female    DOB: 08/11/1927, 77 y.o.   MRN: [id number]  HPI  1. HTN. Checked daily at ALF. New log started in December ranges 120-147/59-69, pulse 72-89. Denies syncope, edema, visual changes.  2. DM. Fasting CBG log for past 3 days ranges 161-185. Patient endorses sometimes eating a banana or other snack prior to the staff checking her blood sugar. Taking metformin 500mg  ER BID. Denies medication side effects, low blood sugars, polyuria, polydipsia, foot ulcers.  3. Depression. Thinks her depressive symptoms were worsened due to a roommate she had recently who has recently moved out. She caused stress and made pt feel tired due to requests about activities. Feels better now, but endorses generalized fatigue.  4. Falls. Has had 3 falls in past 6 months when not using walker at home, according to her daughter. Wonders if a walker with a seat might be helpful. Had PT years ago and feels her strength has decreased since treatments stopped.   5. Chronic cough. Nonproductive, patient gives a vague history with no definite time course. Daughter arrives as we are going over end-of visit-summary, and wants to give more history. We discuss scheduling another appointment to evaluate further due to time constraints.  Review of Systems See HPI otherwise negative. Denies active smoking.    Objective:   Physical Exam  Vitals reviewed. Constitutional: She appears well-developed and well-nourished. No distress.  HENT:  Head: Normocephalic and atraumatic.  Eyes: EOM are normal. Pupils are equal, round, and reactive to light.  Cardiovascular: Normal rate, regular rhythm, normal heart sounds and intact distal pulses.   No murmur heard. Pulmonary/Chest: Effort normal and breath sounds normal. No respiratory distress. She has no wheezes.  Abdominal: Soft.  Musculoskeletal: She exhibits no edema and no tenderness.       Feet without ulcers. Small callus on left 4th  digit. Sensation to light touch intact.  Neurological: She is alert. Coordination normal.       Ambulates with walker, slightly slow otherwise no gross deficits.  Skin: No rash noted.  Psychiatric: She has a normal mood and affect.          Assessment & Plan:

## 2011-03-13 NOTE — Assessment & Plan Note (Signed)
Possibly related to chronic cough, but will need to evaluate again when more time is available.

## 2011-03-13 NOTE — Assessment & Plan Note (Signed)
On stable dose celexa, had some recent increase in symptoms that may have been reactional to bad roommate situation, now resolved. Will f/u symptoms in 3 months follow up.

## 2011-03-13 NOTE — Assessment & Plan Note (Signed)
A1c improved to 7.2 from 7.7. At goal of <8.0. Continue ACEi, metformin currently. Continue podiatry and ophthalmology follow up regularly.

## 2011-03-13 NOTE — Assessment & Plan Note (Signed)
Isolated systolic pressure borderline, but diastolic values lower. Will continue current medications. Check labs today. F/u in 3 months.

## 2011-03-13 NOTE — Assessment & Plan Note (Signed)
Suspect due to diabetic peripheral neuropathy, deconditioning and patient noncompliance with walker. Will order PT/OT evaluation at home to evaluate for safety and appropriateness of walker. Check Vit D levels. Patient would benefit from dedicated geriatric evaluation.

## 2011-03-14 ENCOUNTER — Telehealth: Payer: Self-pay | Admitting: *Deleted

## 2011-03-14 NOTE — Telephone Encounter (Signed)
Called facility to give VO for PT/OT for pt and Ms. Pikus's nurse will have to call back.Loralee Pacas [place]

## 2011-05-09 ENCOUNTER — Other Ambulatory Visit: Payer: Self-pay | Admitting: Family Medicine

## 2011-05-09 DIAGNOSIS — K30 Functional dyspepsia: Secondary | ICD-10-CM

## 2011-05-09 DIAGNOSIS — E119 Type 2 diabetes mellitus without complications: Secondary | ICD-10-CM

## 2011-05-09 MED ORDER — OMEPRAZOLE 20 MG PO CPDR: 20.0000 mg | DELAYED_RELEASE_CAPSULE | Freq: Two times a day (BID) | ORAL | Status: DC

## 2011-05-09 MED ORDER — METFORMIN HCL ER (MOD) 500 MG PO TB24: 500.0000 mg | ORAL_TABLET | Freq: Two times a day (BID) | ORAL | Status: DC

## 2011-05-15 ENCOUNTER — Telehealth: Payer: Self-pay | Admitting: *Deleted

## 2011-05-15 NOTE — Telephone Encounter (Signed)
Patient was on metformin ER 500mg  BID according to my records.  I did not intend to make any changes from previous. OK to use generic metformin ER or immediate release instead of glumetza. Please inform pharmacy. Thanks.

## 2011-05-15 NOTE — Telephone Encounter (Signed)
Received call from [facility].  Needs to clarify diabetes med.  Does MD want patient to be on metformin 500 mg BID or Glumetza 500 mg 24 hr tab BID as ordered on 05/09/11.  Will check with PCP and call back.  Gaylene Brooks, RN

## 2011-05-16 NOTE — Telephone Encounter (Signed)
[facility] informed to have patient continue taking metformin ER 500mg  BID.  Gaylene Brooks, RN

## 2011-05-18 ENCOUNTER — Telehealth: Payer: Self-pay | Admitting: Family Medicine

## 2011-05-18 DIAGNOSIS — L608 Other nail disorders: Secondary | ICD-10-CM | POA: Diagnosis not present

## 2011-05-18 DIAGNOSIS — Q828 Other specified congenital malformations of skin: Secondary | ICD-10-CM | POA: Diagnosis not present

## 2011-05-18 DIAGNOSIS — E1149 Type 2 diabetes mellitus with other diabetic neurological complication: Secondary | ICD-10-CM | POA: Diagnosis not present

## 2011-05-18 NOTE — Telephone Encounter (Signed)
Sounds like she need to be seen by an MD since I am unsure what has caused the sores and if this is an infection. I have returned call, Harriett Sine the case manager saw her feet yesterday. Between right great and second toe looks like a blister. Also has bunions and calluses and has plans to followup with podiatrist. I have advised to schedule MD visit and place pad between toes to avoid friction in the meantime.

## 2011-05-18 NOTE — Telephone Encounter (Signed)
Saw patient today and she has several sores on her toes and needs to know if there are any orders for wound care - has one open and red and other is breaking down (hard to tell) Wants to know what they should do since she has diabetes.  Needs to know something before weekend

## 2011-05-30 ENCOUNTER — Other Ambulatory Visit: Payer: Self-pay | Admitting: Family Medicine

## 2011-05-30 MED ORDER — TRAMADOL HCL 50 MG PO TABS: 50.0000 mg | ORAL_TABLET | Freq: Three times a day (TID) | ORAL | Status: DC | PRN

## 2011-06-06 ENCOUNTER — Other Ambulatory Visit: Payer: Self-pay | Admitting: Family Medicine

## 2011-06-06 MED ORDER — VALSARTAN-HYDROCHLOROTHIAZIDE 320-25 MG PO TABS: 0.5000 | ORAL_TABLET | Freq: Two times a day (BID) | ORAL | Status: DC

## 2011-06-26 ENCOUNTER — Ambulatory Visit (INDEPENDENT_AMBULATORY_CARE_PROVIDER_SITE_OTHER): Payer: Medicare Other | Admitting: Ophthalmology

## 2011-06-26 DIAGNOSIS — H3530 Unspecified macular degeneration: Secondary | ICD-10-CM | POA: Diagnosis not present

## 2011-06-26 DIAGNOSIS — H43819 Vitreous degeneration, unspecified eye: Secondary | ICD-10-CM | POA: Diagnosis not present

## 2011-07-09 ENCOUNTER — Other Ambulatory Visit: Payer: Self-pay | Admitting: Family Medicine

## 2011-07-09 DIAGNOSIS — K30 Functional dyspepsia: Secondary | ICD-10-CM

## 2011-07-09 MED ORDER — OMEPRAZOLE 20 MG PO CPDR: 20.0000 mg | DELAYED_RELEASE_CAPSULE | Freq: Two times a day (BID) | ORAL | Status: DC

## 2011-07-09 MED ORDER — FLUTICASONE PROPIONATE 50 MCG/ACT NA SUSP: 2.0000 | Freq: Every day | NASAL | Status: DC

## 2011-07-09 MED ORDER — VALSARTAN-HYDROCHLOROTHIAZIDE 320-25 MG PO TABS: 0.5000 | ORAL_TABLET | Freq: Two times a day (BID) | ORAL | Status: DC

## 2011-07-09 MED ORDER — CITALOPRAM HYDROBROMIDE 20 MG PO TABS: 30.0000 mg | ORAL_TABLET | Freq: Every day | ORAL | Status: DC

## 2011-07-16 DIAGNOSIS — R489 Unspecified symbolic dysfunctions: Secondary | ICD-10-CM | POA: Diagnosis not present

## 2011-07-16 DIAGNOSIS — I10 Essential (primary) hypertension: Secondary | ICD-10-CM | POA: Diagnosis not present

## 2011-07-16 DIAGNOSIS — F329 Major depressive disorder, single episode, unspecified: Secondary | ICD-10-CM | POA: Diagnosis not present

## 2011-07-18 DIAGNOSIS — I10 Essential (primary) hypertension: Secondary | ICD-10-CM | POA: Diagnosis not present

## 2011-07-18 DIAGNOSIS — R489 Unspecified symbolic dysfunctions: Secondary | ICD-10-CM | POA: Diagnosis not present

## 2011-07-18 DIAGNOSIS — F329 Major depressive disorder, single episode, unspecified: Secondary | ICD-10-CM | POA: Diagnosis not present

## 2011-07-20 DIAGNOSIS — R489 Unspecified symbolic dysfunctions: Secondary | ICD-10-CM | POA: Diagnosis not present

## 2011-07-20 DIAGNOSIS — I10 Essential (primary) hypertension: Secondary | ICD-10-CM | POA: Diagnosis not present

## 2011-07-20 DIAGNOSIS — F329 Major depressive disorder, single episode, unspecified: Secondary | ICD-10-CM | POA: Diagnosis not present

## 2011-07-22 DIAGNOSIS — I10 Essential (primary) hypertension: Secondary | ICD-10-CM | POA: Diagnosis not present

## 2011-07-22 DIAGNOSIS — F329 Major depressive disorder, single episode, unspecified: Secondary | ICD-10-CM | POA: Diagnosis not present

## 2011-07-22 DIAGNOSIS — R489 Unspecified symbolic dysfunctions: Secondary | ICD-10-CM | POA: Diagnosis not present

## 2011-07-23 DIAGNOSIS — I10 Essential (primary) hypertension: Secondary | ICD-10-CM | POA: Diagnosis not present

## 2011-07-23 DIAGNOSIS — F329 Major depressive disorder, single episode, unspecified: Secondary | ICD-10-CM | POA: Diagnosis not present

## 2011-07-23 DIAGNOSIS — R489 Unspecified symbolic dysfunctions: Secondary | ICD-10-CM | POA: Diagnosis not present

## 2011-07-25 DIAGNOSIS — R489 Unspecified symbolic dysfunctions: Secondary | ICD-10-CM | POA: Diagnosis not present

## 2011-07-25 DIAGNOSIS — Q828 Other specified congenital malformations of skin: Secondary | ICD-10-CM | POA: Diagnosis not present

## 2011-07-25 DIAGNOSIS — E1149 Type 2 diabetes mellitus with other diabetic neurological complication: Secondary | ICD-10-CM | POA: Diagnosis not present

## 2011-07-25 DIAGNOSIS — L608 Other nail disorders: Secondary | ICD-10-CM | POA: Diagnosis not present

## 2011-07-25 DIAGNOSIS — F329 Major depressive disorder, single episode, unspecified: Secondary | ICD-10-CM | POA: Diagnosis not present

## 2011-07-25 DIAGNOSIS — I10 Essential (primary) hypertension: Secondary | ICD-10-CM | POA: Diagnosis not present

## 2011-07-30 DIAGNOSIS — F329 Major depressive disorder, single episode, unspecified: Secondary | ICD-10-CM | POA: Diagnosis not present

## 2011-07-30 DIAGNOSIS — I10 Essential (primary) hypertension: Secondary | ICD-10-CM | POA: Diagnosis not present

## 2011-07-30 DIAGNOSIS — R489 Unspecified symbolic dysfunctions: Secondary | ICD-10-CM | POA: Diagnosis not present

## 2011-07-31 DIAGNOSIS — I10 Essential (primary) hypertension: Secondary | ICD-10-CM | POA: Diagnosis not present

## 2011-07-31 DIAGNOSIS — R489 Unspecified symbolic dysfunctions: Secondary | ICD-10-CM | POA: Diagnosis not present

## 2011-07-31 DIAGNOSIS — F329 Major depressive disorder, single episode, unspecified: Secondary | ICD-10-CM | POA: Diagnosis not present

## 2011-08-03 DIAGNOSIS — F329 Major depressive disorder, single episode, unspecified: Secondary | ICD-10-CM | POA: Diagnosis not present

## 2011-08-03 DIAGNOSIS — R489 Unspecified symbolic dysfunctions: Secondary | ICD-10-CM | POA: Diagnosis not present

## 2011-08-03 DIAGNOSIS — I10 Essential (primary) hypertension: Secondary | ICD-10-CM | POA: Diagnosis not present

## 2011-08-06 DIAGNOSIS — R489 Unspecified symbolic dysfunctions: Secondary | ICD-10-CM | POA: Diagnosis not present

## 2011-08-06 DIAGNOSIS — F329 Major depressive disorder, single episode, unspecified: Secondary | ICD-10-CM | POA: Diagnosis not present

## 2011-08-06 DIAGNOSIS — I10 Essential (primary) hypertension: Secondary | ICD-10-CM | POA: Diagnosis not present

## 2011-08-08 DIAGNOSIS — F329 Major depressive disorder, single episode, unspecified: Secondary | ICD-10-CM | POA: Diagnosis not present

## 2011-08-08 DIAGNOSIS — R489 Unspecified symbolic dysfunctions: Secondary | ICD-10-CM | POA: Diagnosis not present

## 2011-08-08 DIAGNOSIS — I10 Essential (primary) hypertension: Secondary | ICD-10-CM | POA: Diagnosis not present

## 2011-08-14 ENCOUNTER — Other Ambulatory Visit: Payer: Self-pay | Admitting: Dermatology

## 2011-08-14 DIAGNOSIS — L570 Actinic keratosis: Secondary | ICD-10-CM | POA: Diagnosis not present

## 2011-08-14 DIAGNOSIS — D0439 Carcinoma in situ of skin of other parts of face: Secondary | ICD-10-CM | POA: Diagnosis not present

## 2011-08-14 DIAGNOSIS — D2330 Other benign neoplasm of skin of unspecified part of face: Secondary | ICD-10-CM | POA: Diagnosis not present

## 2011-08-24 ENCOUNTER — Other Ambulatory Visit: Payer: Self-pay | Admitting: Family Medicine

## 2011-08-24 DIAGNOSIS — E785 Hyperlipidemia, unspecified: Secondary | ICD-10-CM

## 2011-08-24 MED ORDER — ATORVASTATIN CALCIUM 20 MG PO TABS: 20.0000 mg | ORAL_TABLET | Freq: Every day | ORAL | Status: AC

## 2011-08-30 ENCOUNTER — Ambulatory Visit: Payer: Medicare Other | Admitting: Family Medicine

## 2011-08-31 ENCOUNTER — Encounter: Payer: Self-pay | Admitting: Family Medicine

## 2011-08-31 ENCOUNTER — Ambulatory Visit (INDEPENDENT_AMBULATORY_CARE_PROVIDER_SITE_OTHER): Payer: Medicare Other | Admitting: Family Medicine

## 2011-08-31 VITALS — BP 150/73 | HR 81 | Temp 96.9°F | Wt 158.1 lb

## 2011-08-31 DIAGNOSIS — F329 Major depressive disorder, single episode, unspecified: Secondary | ICD-10-CM | POA: Diagnosis not present

## 2011-08-31 DIAGNOSIS — R634 Abnormal weight loss: Secondary | ICD-10-CM | POA: Insufficient documentation

## 2011-08-31 DIAGNOSIS — I10 Essential (primary) hypertension: Secondary | ICD-10-CM

## 2011-08-31 DIAGNOSIS — N898 Other specified noninflammatory disorders of vagina: Secondary | ICD-10-CM

## 2011-08-31 DIAGNOSIS — N393 Stress incontinence (female) (male): Secondary | ICD-10-CM

## 2011-08-31 DIAGNOSIS — N760 Acute vaginitis: Secondary | ICD-10-CM

## 2011-08-31 DIAGNOSIS — B369 Superficial mycosis, unspecified: Secondary | ICD-10-CM

## 2011-08-31 DIAGNOSIS — E119 Type 2 diabetes mellitus without complications: Secondary | ICD-10-CM

## 2011-08-31 DIAGNOSIS — N949 Unspecified condition associated with female genital organs and menstrual cycle: Secondary | ICD-10-CM

## 2011-08-31 LAB — CBC
Hemoglobin: 12.2 g/dL (ref 12.0–15.0)
MCH: 26.1 pg (ref 26.0–34.0)
MCHC: 33.9 g/dL (ref 30.0–36.0)
MCV: 76.9 fL — ABNORMAL LOW (ref 78.0–100.0)

## 2011-08-31 LAB — POCT URINALYSIS DIPSTICK
Blood, UA: NEGATIVE
Glucose, UA: NEGATIVE
Nitrite, UA: NEGATIVE
Urobilinogen, UA: 1

## 2011-08-31 LAB — POCT WET PREP (WET MOUNT)

## 2011-08-31 LAB — BASIC METABOLIC PANEL
CO2: 30 mEq/L (ref 19–32)
Chloride: 96 mEq/L (ref 96–112)
Creat: 1 mg/dL (ref 0.50–1.10)
Potassium: 3.6 mEq/L (ref 3.5–5.3)

## 2011-08-31 LAB — TSH: TSH: 1.593 u[IU]/mL (ref 0.350–4.500)

## 2011-08-31 LAB — POCT GLYCOSYLATED HEMOGLOBIN (HGB A1C): Hemoglobin A1C: 6.6

## 2011-08-31 NOTE — Assessment & Plan Note (Signed)
Symptoms are stable and tolerating Celexa. Continue medication.

## 2011-08-31 NOTE — Patient Instructions (Signed)
Nice to see you again. Your diabetes and blood pressure are under good control. If you keep doing good, we may decrease your medications. You can take glucerna with meals for extra nutrition. Make an appointment in 3-4 months for check up and lab work (kidney function, blood counts).  Diabetes and Standards of Medical Care  Diabetes is complicated. You may find that your diabetes team includes a dietitian, nurse, diabetes educator, eye doctor, and more. To help everyone know what is going on and to help you get the care you deserve, the following schedule of care was developed to help keep you on track. Below are the tests, exams, vaccines, medicines, education, and plans you will need. A1c test  Performed at least 2 times a year if you are meeting treatment goals.   Performed 4 times a year if therapy has changed or if you are not meeting therapy/glycemic goals.  Aspirin medicine  Take daily as directed by your caregiver.  Blood pressure test  Performed at every routine medical visit. The goal is less than 130/80 mm/Hg.  Dental exam  Get a dental exam at least 2 times a year.  Dilated eye exam (retinal exam)  Type 1 diabetes: Get an exam within 5 years of diagnosis and then yearly.   Type 2 diabetes: Get an exam at diagnosis and then yearly.  All exams thereafter can be extended to every 2 to 3 years if one or more exams have been normal. Foot care exam  Visual foot exams are performed at every routine medical visit. The exams check for cuts, injuries, or other problems with the feet.   A comprehensive foot exam should be done yearly. This includes visual inspection as well as assessing foot pulses and testing for loss of sensation.  Kidney function test (urine microalbumin)  Performed once a year.   Type 1 diabetes: The first test is performed 5 years after diagnosis.   Type 2 diabetes: The first test is performed at the time of diagnosis.   A serum creatinine and estimated  glomerular filtration rate (eGFR) test is done once a year to tell the level of chronic kidney disease (CKD), if present.  Lipid profile (Cholesterol, HDL, LDL, Triglycerides)  Performed once a year for most people. If at low risk, may be assessed every 2 years.   The goal for LDL is less than 100 mg/dl. If at high risk, the goal is less than 70 mg/dl.   The goal for HDL is higher than 40 mg/dl for men and higher than 50 mg/dl for women.   The goal for triglycerides is less than 150 mg/dl.  Flu vaccine, pneumonia vaccine, and hepatitis B vaccine  The flu vaccine is recommended yearly.   The pneumonia vaccine is generally given once in a lifetime. However, there are some instances where another vaccine is recommended. Check with your caregiver.   The hepatitis B vaccine is also recommended for adults with diabetes.  Diabetes self-management education  Recommended at diagnosis and ongoing as needed.  Treatment plan  Reviewed at every medical visit.  Document Released: 01/21/2009 Document Revised: 03/15/2011 Document Reviewed: 09/26/2010 [facility] Patient Information 2012 [place], [place].

## 2011-08-31 NOTE — Assessment & Plan Note (Signed)
Loss of 10 pounds in 5 months, patient stated due to decreased appetite. Perhaps this may be early indication of worsening dementia. Will check labs including TSH, renal function, electrolytes to rule out organic cause. Given prescription for Glucerna supplement daily when necessary

## 2011-08-31 NOTE — Assessment & Plan Note (Signed)
Poise pads ordered instead of Depends diapers.

## 2011-08-31 NOTE — Assessment & Plan Note (Signed)
Daily measurements at assisted living facility are at goal with some borderline lows. Will continue current therapy. Check labs today including renal function.

## 2011-08-31 NOTE — Assessment & Plan Note (Signed)
Will check wet prep and urinalysis the patient denies any definite symptoms for UTI including fever and dysuria. Reassurance given as exam not impressive.

## 2011-08-31 NOTE — Progress Notes (Signed)
  Subjective:    Patient ID: Debra Tran, female    DOB: May 09, 1927, 78 y.o.   MRN: [id number]  HPI  1. HTN. BP ranges from ALF systolic 106-153 and diastolic 53-70. No knowledge of medication side effects. Denies chest pain, visual change, edema, syncope.  2. Vaginal odor. Patient unsure of timeframe but notes some odor. Denies any dysuria, abdominal pain, abnormal bleeding or discharge or itching. She has noted mild intermittent nausea unsure of significance.  3. DM. Fasting CBGs ranged from 126-181. Taking metformin twice a day. A1c is 6.6 today, patient's daughter believes this is do to poor appetite.  4. Weight loss. 10 pound the past 5 months. Patient has not been eating as large portions as previous do to poor appetite. Daughter asked if nutritional supplement would be appropriate. She denies any abdominal pain, fevers, irregular bowel movements.  5. Rash followed by Derm. 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 Jason Nest.   Review of Systems See HPI otherwise negative.  reports that she has quit smoking. She does not have any smokeless tobacco history on file.    Objective:   Physical Exam  Vitals reviewed. Constitutional: She is oriented to person, place, and time. She appears well-developed and well-nourished. No distress.  HENT:  Head: Normocephalic and atraumatic.  Eyes: EOM are normal.  Cardiovascular: Normal rate, regular rhythm and normal heart sounds.   No murmur heard. Pulmonary/Chest: Effort normal and breath sounds normal. No respiratory distress. She has no wheezes. She has no rales.  Abdominal: Soft. Bowel sounds are normal. She exhibits no distension. There is no tenderness. There is no rebound.  Genitourinary: Vagina normal and uterus normal. No vaginal discharge found.       Mild external skin atrophy. No odor noted.   Musculoskeletal: She exhibits no edema and no tenderness.       Walks with walker, requires  assistance with transfer to exam table.  Neurological: She is alert and oriented to person, place, and time.  Skin: No rash noted.  Psychiatric: She has a normal mood and affect.          Assessment & Plan:

## 2011-08-31 NOTE — Assessment & Plan Note (Signed)
Improved control. Continue metformin at low dose. Will add Glucerna nutritional supplement. Followup labs, the exam, ophthalmology at next visit in 3 months.

## 2011-09-10 DIAGNOSIS — M159 Polyosteoarthritis, unspecified: Secondary | ICD-10-CM | POA: Diagnosis not present

## 2011-09-10 DIAGNOSIS — R262 Difficulty in walking, not elsewhere classified: Secondary | ICD-10-CM | POA: Diagnosis not present

## 2011-09-10 DIAGNOSIS — R489 Unspecified symbolic dysfunctions: Secondary | ICD-10-CM | POA: Diagnosis not present

## 2011-09-10 DIAGNOSIS — I10 Essential (primary) hypertension: Secondary | ICD-10-CM | POA: Diagnosis not present

## 2011-09-10 DIAGNOSIS — M6281 Muscle weakness (generalized): Secondary | ICD-10-CM | POA: Diagnosis not present

## 2011-09-10 DIAGNOSIS — F329 Major depressive disorder, single episode, unspecified: Secondary | ICD-10-CM | POA: Diagnosis not present

## 2011-09-12 ENCOUNTER — Other Ambulatory Visit: Payer: Self-pay | Admitting: *Deleted

## 2011-09-12 MED ORDER — KETOCONAZOLE 2 % EX CREA: TOPICAL_CREAM | Freq: Every day | CUTANEOUS | Status: DC

## 2011-09-14 ENCOUNTER — Other Ambulatory Visit: Payer: Self-pay | Admitting: Family Medicine

## 2011-09-14 MED ORDER — KETOCONAZOLE 2 % EX CREA: TOPICAL_CREAM | Freq: Every day | CUTANEOUS | Status: DC

## 2011-09-21 ENCOUNTER — Telehealth: Payer: Self-pay | Admitting: Family Medicine

## 2011-09-21 NOTE — Telephone Encounter (Signed)
Returned call. LEft VM. Not sure which medication she wanted to discuss. Only advised to start glucerna at last visit. OK to substitute boost or alternative if desired.

## 2011-09-21 NOTE — Telephone Encounter (Signed)
Daughter called to speak to Dr. Cristal Ford about her last appt, some medications that were discussed.  There seems to be some confusion and she wants to get some clarification so that her mom gets the best care possible.

## 2011-09-25 DIAGNOSIS — Z1231 Encounter for screening mammogram for malignant neoplasm of breast: Secondary | ICD-10-CM | POA: Diagnosis not present

## 2011-09-26 ENCOUNTER — Telehealth: Payer: Self-pay | Admitting: Family Medicine

## 2011-09-26 NOTE — Telephone Encounter (Signed)
Daughter does want to speak to Dr. Cristal Ford, because she isn't sure the Assisted Living is doing everything they are supposed to.  Back in May, she was tested for a yeast infection and a uti, Monistat was ordered, but the orders said that she did not have a yeast infection.  She also has questions about meds that have been discharged.  She also has a concern about the 30 pound weight loss.  The Assisted Living said that she lost 20 pds in 1 month so she is sure there is something wrong and she wants to discussed this.

## 2011-09-26 NOTE — Telephone Encounter (Signed)
Returned [employer] call again. Left VM. Requested she call back if needs to discuss anything.

## 2011-09-28 NOTE — Telephone Encounter (Signed)
I returned call, no answer. At our recent office visit she did not bring up this concern. I see that Debra Tran has lost about 10 lbs in past 14 months according to our records. Over the past 3 or so years has lost 20-30 lbs.  I ordered the monistat due to her symptoms of itching. If she still is having symptoms or concerns then she needs another office visit. I am not in the office this week, but can try calling back again later.

## 2011-10-02 ENCOUNTER — Other Ambulatory Visit: Payer: Self-pay | Admitting: Family Medicine

## 2011-10-02 ENCOUNTER — Encounter: Payer: Self-pay | Admitting: *Deleted

## 2011-10-02 MED ORDER — TRAMADOL HCL 50 MG PO TABS: 50.0000 mg | ORAL_TABLET | Freq: Three times a day (TID) | ORAL | Status: DC | PRN

## 2011-10-02 NOTE — Telephone Encounter (Signed)
Daughter called to say that mom actually has lost 30 past in the last 2 months.  Wanted to make sure she understood when the weight loss have actually taken place.  Please call daughter on cell phone [phone]

## 2011-10-02 NOTE — Telephone Encounter (Signed)
Spoke with daughter and clarified our documented weight loss of 10 lb in 15 months (not in one month) and treatment of presumed yeast infection. She states the ALF documented 20 lb loss in one month. States mother is not eating as much food as she used to, appetite is lower. Have started boost shakes as discussed.  We agree to resume this conversation at next office visit and follow up sooner for any more concerning symptoms.

## 2011-10-02 NOTE — Telephone Encounter (Signed)
Returned call to daughter again. No answer, left VM.

## 2011-10-09 DIAGNOSIS — M159 Polyosteoarthritis, unspecified: Secondary | ICD-10-CM | POA: Diagnosis not present

## 2011-10-09 DIAGNOSIS — R262 Difficulty in walking, not elsewhere classified: Secondary | ICD-10-CM | POA: Diagnosis not present

## 2011-10-09 DIAGNOSIS — M6281 Muscle weakness (generalized): Secondary | ICD-10-CM | POA: Diagnosis not present

## 2011-10-10 DIAGNOSIS — M6281 Muscle weakness (generalized): Secondary | ICD-10-CM | POA: Diagnosis not present

## 2011-10-10 DIAGNOSIS — R262 Difficulty in walking, not elsewhere classified: Secondary | ICD-10-CM | POA: Diagnosis not present

## 2011-10-10 DIAGNOSIS — M159 Polyosteoarthritis, unspecified: Secondary | ICD-10-CM | POA: Diagnosis not present

## 2011-10-15 DIAGNOSIS — R262 Difficulty in walking, not elsewhere classified: Secondary | ICD-10-CM | POA: Diagnosis not present

## 2011-10-15 DIAGNOSIS — M6281 Muscle weakness (generalized): Secondary | ICD-10-CM | POA: Diagnosis not present

## 2011-10-15 DIAGNOSIS — M159 Polyosteoarthritis, unspecified: Secondary | ICD-10-CM | POA: Diagnosis not present

## 2011-10-16 DIAGNOSIS — M159 Polyosteoarthritis, unspecified: Secondary | ICD-10-CM | POA: Diagnosis not present

## 2011-10-16 DIAGNOSIS — R262 Difficulty in walking, not elsewhere classified: Secondary | ICD-10-CM | POA: Diagnosis not present

## 2011-10-16 DIAGNOSIS — M6281 Muscle weakness (generalized): Secondary | ICD-10-CM | POA: Diagnosis not present

## 2011-10-17 DIAGNOSIS — M159 Polyosteoarthritis, unspecified: Secondary | ICD-10-CM | POA: Diagnosis not present

## 2011-10-17 DIAGNOSIS — M6281 Muscle weakness (generalized): Secondary | ICD-10-CM | POA: Diagnosis not present

## 2011-10-17 DIAGNOSIS — R262 Difficulty in walking, not elsewhere classified: Secondary | ICD-10-CM | POA: Diagnosis not present

## 2011-10-22 DIAGNOSIS — M159 Polyosteoarthritis, unspecified: Secondary | ICD-10-CM | POA: Diagnosis not present

## 2011-10-22 DIAGNOSIS — R262 Difficulty in walking, not elsewhere classified: Secondary | ICD-10-CM | POA: Diagnosis not present

## 2011-10-22 DIAGNOSIS — M6281 Muscle weakness (generalized): Secondary | ICD-10-CM | POA: Diagnosis not present

## 2011-10-23 DIAGNOSIS — R262 Difficulty in walking, not elsewhere classified: Secondary | ICD-10-CM | POA: Diagnosis not present

## 2011-10-23 DIAGNOSIS — M6281 Muscle weakness (generalized): Secondary | ICD-10-CM | POA: Diagnosis not present

## 2011-10-23 DIAGNOSIS — M159 Polyosteoarthritis, unspecified: Secondary | ICD-10-CM | POA: Diagnosis not present

## 2011-10-24 DIAGNOSIS — M6281 Muscle weakness (generalized): Secondary | ICD-10-CM | POA: Diagnosis not present

## 2011-10-24 DIAGNOSIS — M159 Polyosteoarthritis, unspecified: Secondary | ICD-10-CM | POA: Diagnosis not present

## 2011-10-24 DIAGNOSIS — R262 Difficulty in walking, not elsewhere classified: Secondary | ICD-10-CM | POA: Diagnosis not present

## 2011-10-26 DIAGNOSIS — M159 Polyosteoarthritis, unspecified: Secondary | ICD-10-CM | POA: Diagnosis not present

## 2011-10-26 DIAGNOSIS — R262 Difficulty in walking, not elsewhere classified: Secondary | ICD-10-CM | POA: Diagnosis not present

## 2011-10-26 DIAGNOSIS — M6281 Muscle weakness (generalized): Secondary | ICD-10-CM | POA: Diagnosis not present

## 2011-10-30 DIAGNOSIS — R262 Difficulty in walking, not elsewhere classified: Secondary | ICD-10-CM | POA: Diagnosis not present

## 2011-10-30 DIAGNOSIS — M159 Polyosteoarthritis, unspecified: Secondary | ICD-10-CM | POA: Diagnosis not present

## 2011-10-30 DIAGNOSIS — M6281 Muscle weakness (generalized): Secondary | ICD-10-CM | POA: Diagnosis not present

## 2011-10-31 DIAGNOSIS — M159 Polyosteoarthritis, unspecified: Secondary | ICD-10-CM | POA: Diagnosis not present

## 2011-10-31 DIAGNOSIS — R262 Difficulty in walking, not elsewhere classified: Secondary | ICD-10-CM | POA: Diagnosis not present

## 2011-10-31 DIAGNOSIS — M6281 Muscle weakness (generalized): Secondary | ICD-10-CM | POA: Diagnosis not present

## 2011-11-01 DIAGNOSIS — R262 Difficulty in walking, not elsewhere classified: Secondary | ICD-10-CM | POA: Diagnosis not present

## 2011-11-01 DIAGNOSIS — M159 Polyosteoarthritis, unspecified: Secondary | ICD-10-CM | POA: Diagnosis not present

## 2011-11-01 DIAGNOSIS — M6281 Muscle weakness (generalized): Secondary | ICD-10-CM | POA: Diagnosis not present

## 2011-11-02 ENCOUNTER — Other Ambulatory Visit: Payer: Self-pay | Admitting: *Deleted

## 2011-11-02 DIAGNOSIS — E119 Type 2 diabetes mellitus without complications: Secondary | ICD-10-CM

## 2011-11-02 MED ORDER — TRAMADOL HCL 50 MG PO TABS: 50.0000 mg | ORAL_TABLET | Freq: Three times a day (TID) | ORAL | Status: DC | PRN

## 2011-11-02 MED ORDER — METFORMIN HCL ER (MOD) 500 MG PO TB24: 500.0000 mg | ORAL_TABLET | Freq: Two times a day (BID) | ORAL | Status: DC

## 2011-11-06 DIAGNOSIS — L608 Other nail disorders: Secondary | ICD-10-CM | POA: Diagnosis not present

## 2011-11-06 DIAGNOSIS — M159 Polyosteoarthritis, unspecified: Secondary | ICD-10-CM | POA: Diagnosis not present

## 2011-11-06 DIAGNOSIS — Q828 Other specified congenital malformations of skin: Secondary | ICD-10-CM | POA: Diagnosis not present

## 2011-11-06 DIAGNOSIS — E1149 Type 2 diabetes mellitus with other diabetic neurological complication: Secondary | ICD-10-CM | POA: Diagnosis not present

## 2011-11-06 DIAGNOSIS — R262 Difficulty in walking, not elsewhere classified: Secondary | ICD-10-CM | POA: Diagnosis not present

## 2011-11-06 DIAGNOSIS — M6281 Muscle weakness (generalized): Secondary | ICD-10-CM | POA: Diagnosis not present

## 2011-11-07 DIAGNOSIS — M159 Polyosteoarthritis, unspecified: Secondary | ICD-10-CM | POA: Diagnosis not present

## 2011-11-07 DIAGNOSIS — M6281 Muscle weakness (generalized): Secondary | ICD-10-CM | POA: Diagnosis not present

## 2011-11-07 DIAGNOSIS — R262 Difficulty in walking, not elsewhere classified: Secondary | ICD-10-CM | POA: Diagnosis not present

## 2011-11-09 DIAGNOSIS — R262 Difficulty in walking, not elsewhere classified: Secondary | ICD-10-CM | POA: Diagnosis not present

## 2011-11-09 DIAGNOSIS — M159 Polyosteoarthritis, unspecified: Secondary | ICD-10-CM | POA: Diagnosis not present

## 2011-11-12 DIAGNOSIS — R262 Difficulty in walking, not elsewhere classified: Secondary | ICD-10-CM | POA: Diagnosis not present

## 2011-11-12 DIAGNOSIS — M159 Polyosteoarthritis, unspecified: Secondary | ICD-10-CM | POA: Diagnosis not present

## 2011-11-13 DIAGNOSIS — R262 Difficulty in walking, not elsewhere classified: Secondary | ICD-10-CM | POA: Diagnosis not present

## 2011-11-13 DIAGNOSIS — M159 Polyosteoarthritis, unspecified: Secondary | ICD-10-CM | POA: Diagnosis not present

## 2011-11-14 DIAGNOSIS — M159 Polyosteoarthritis, unspecified: Secondary | ICD-10-CM | POA: Diagnosis not present

## 2011-11-14 DIAGNOSIS — R262 Difficulty in walking, not elsewhere classified: Secondary | ICD-10-CM | POA: Diagnosis not present

## 2011-12-04 ENCOUNTER — Other Ambulatory Visit: Payer: Self-pay | Admitting: *Deleted

## 2011-12-04 MED ORDER — TRAMADOL HCL 50 MG PO TABS: 50.0000 mg | ORAL_TABLET | Freq: Three times a day (TID) | ORAL | Status: DC | PRN

## 2011-12-12 ENCOUNTER — Ambulatory Visit (INDEPENDENT_AMBULATORY_CARE_PROVIDER_SITE_OTHER): Payer: Medicare Other | Admitting: Family Medicine

## 2011-12-12 VITALS — BP 154/56 | HR 62 | Temp 98.2°F | Wt 159.0 lb

## 2011-12-12 DIAGNOSIS — I10 Essential (primary) hypertension: Secondary | ICD-10-CM | POA: Diagnosis not present

## 2011-12-12 DIAGNOSIS — F209 Schizophrenia, unspecified: Secondary | ICD-10-CM | POA: Diagnosis not present

## 2011-12-12 DIAGNOSIS — R634 Abnormal weight loss: Secondary | ICD-10-CM

## 2011-12-12 DIAGNOSIS — E119 Type 2 diabetes mellitus without complications: Secondary | ICD-10-CM | POA: Diagnosis not present

## 2011-12-12 DIAGNOSIS — F0390 Unspecified dementia without behavioral disturbance: Secondary | ICD-10-CM | POA: Diagnosis not present

## 2011-12-12 DIAGNOSIS — R29818 Other symptoms and signs involving the nervous system: Secondary | ICD-10-CM

## 2011-12-12 DIAGNOSIS — R296 Repeated falls: Secondary | ICD-10-CM

## 2011-12-12 LAB — POCT GLYCOSYLATED HEMOGLOBIN (HGB A1C): Hemoglobin A1C: 6.3

## 2011-12-12 NOTE — Patient Instructions (Addendum)
Nice to see you again. Your weight is stable last 3 months. Keep working with physical therapy for your falling.  Use the walker safely.  Wt Readings from Last 3 Encounters:  12/12/11 159 lb (72.122 kg)  08/31/11 158 lb 1.6 oz (71.714 kg)  03/12/11 168 lb (76.204 kg)   You may have a progression of memory issue or dementia. If you decide to try medication to slow progression, this is called aricept and may help.

## 2011-12-13 NOTE — Assessment & Plan Note (Addendum)
MMSE 22/30. Previously documented as 27/30 in 2011. Patient and her daughter are not too concerned regarding this trend, and feel it is primarily secondary to her history of ECT in the 51s. Perhaps has been a very slow gradual decline suspicious for alzheimer's type. Discussed potential benefit of aricept in slowing decline and they are not interested today. At follow up visit will check RPR, B12, folate and repeat labs that are due. TSH has been normal. Advised to f/u if they decide to try treatment.

## 2011-12-13 NOTE — Progress Notes (Signed)
  Subjective:    Patient ID: Debra Tran, female    DOB: 1927-05-01, 78 y.o.   MRN: [id number]  HPI Daughter presents with pt from [facility].  1. Weight loss. Was a concern from daughter few months ago. The assisted living facility noted a 10 lb loss in one month. I reviewed our records with her showing a more slow loss and stabilization since then. She has started ensure/boost supplemental shakes. Good appetite. Denies abd pain, diarrhea, fever, chills, sweats. She does have a slight gradual decline in memory.  2. Falls. Daughter states patient fell due to not using walker correctly. Was carrying two cups of coffee and trying to use walker together. She is undergoing PT at ALF with some improvement now. Last Vit D level 6 months ago was 45. She denies one-sided weakness, numbness, worsening vision, vertigo.  3. Memory deficits. Daughter states has been an issue since her mother underwent "shock treatments" in the 14s for mental health issues. She had to relearn several things. She does require assistance with medications, meal preparation. Noted slight increase in forgetfullness and pt admits to some emotional lability recently because she "cusssed out" a fellow resident. They are happy with care at ALF currently, no concerns identified.  4. DM.  Lab Results  Component Value Date   HGBA1C 6.3 12/12/2011   Compliant with metformin. No issues.  5. HTN. BP ranges SBP 150-160s/ 40-60s DBP. Denies medication side effects.  Review of Systems See HPI otherwise negative.  reports that she has quit smoking. She does not have any smokeless tobacco history on file.     Objective:   Physical Exam  Vitals reviewed. Constitutional: She is oriented to person, place, and time. She appears well-developed and well-nourished. No distress.  HENT:  Head: Normocephalic and atraumatic.  Eyes: EOM are normal. Pupils are equal, round, and reactive to light.  Neck: Neck supple.  Cardiovascular:  Normal rate, regular rhythm and normal heart sounds.   No murmur heard. Pulmonary/Chest: Effort normal.  Musculoskeletal: She exhibits no edema and no tenderness.       Rising from seated position is slow, requires assistance off table. Gait is swift with walker, normal cadence and appropriate base. Quick turn around.  Neg romberg. No asterixis.  5/5 bilateral UE and LE strength.  Neurological: She is alert and oriented to person, place, and time.  Skin: No rash noted.  Psychiatric: She has a normal mood and affect. Her behavior is normal. Thought content normal.       Assessment & Plan:

## 2011-12-13 NOTE — Assessment & Plan Note (Signed)
Stable last 4-5 months with supplemental shakes. Suspect may be related to forgetfulness/declining mental function. Follow up next visits or for any red flags. Wt Readings from Last 3 Encounters:  12/12/11 159 lb (72.122 kg)  08/31/11 158 lb 1.6 oz (71.714 kg)  03/12/11 168 lb (76.204 kg)

## 2011-12-13 NOTE — Assessment & Plan Note (Signed)
At goal currently. Continue metformin. Patient follows with podiatry.

## 2011-12-13 NOTE — Assessment & Plan Note (Signed)
At goal considering age. F/u labs in 3 months.

## 2011-12-13 NOTE — Assessment & Plan Note (Signed)
Improved with proper instruction with walker device and with physical therapy. Likely more behavioral related than anything. Vit D level is good. F/u as needed.

## 2011-12-20 DIAGNOSIS — L538 Other specified erythematous conditions: Secondary | ICD-10-CM | POA: Diagnosis not present

## 2012-01-01 ENCOUNTER — Encounter: Payer: Self-pay | Admitting: *Deleted

## 2012-01-03 ENCOUNTER — Other Ambulatory Visit: Payer: Self-pay | Admitting: *Deleted

## 2012-01-03 MED ORDER — TRAMADOL HCL 50 MG PO TABS: 50.0000 mg | ORAL_TABLET | Freq: Three times a day (TID) | ORAL | Status: DC | PRN

## 2012-01-03 MED ORDER — CITALOPRAM HYDROBROMIDE 20 MG PO TABS: 30.0000 mg | ORAL_TABLET | Freq: Every day | ORAL | Status: DC

## 2012-01-08 DIAGNOSIS — E1149 Type 2 diabetes mellitus with other diabetic neurological complication: Secondary | ICD-10-CM | POA: Diagnosis not present

## 2012-01-08 DIAGNOSIS — Q828 Other specified congenital malformations of skin: Secondary | ICD-10-CM | POA: Diagnosis not present

## 2012-01-08 DIAGNOSIS — L608 Other nail disorders: Secondary | ICD-10-CM | POA: Diagnosis not present

## 2012-01-16 ENCOUNTER — Emergency Department (HOSPITAL_COMMUNITY): Payer: Medicare Other

## 2012-01-16 ENCOUNTER — Encounter (HOSPITAL_COMMUNITY): Payer: Self-pay

## 2012-01-16 ENCOUNTER — Emergency Department (HOSPITAL_COMMUNITY)
Admission: EM | Admit: 2012-01-16 | Discharge: 2012-01-16 | Disposition: A | Discharge status: Skilled Nursing Facility | Payer: Medicare Other | Attending: Emergency Medicine | Admitting: Emergency Medicine

## 2012-01-16 DIAGNOSIS — S0990XA Unspecified injury of head, initial encounter: Secondary | ICD-10-CM | POA: Diagnosis not present

## 2012-01-16 DIAGNOSIS — H3530 Unspecified macular degeneration: Secondary | ICD-10-CM | POA: Diagnosis not present

## 2012-01-16 DIAGNOSIS — Y92129 Unspecified place in nursing home as the place of occurrence of the external cause: Secondary | ICD-10-CM

## 2012-01-16 DIAGNOSIS — Z79899 Other long term (current) drug therapy: Secondary | ICD-10-CM | POA: Insufficient documentation

## 2012-01-16 DIAGNOSIS — I10 Essential (primary) hypertension: Secondary | ICD-10-CM | POA: Insufficient documentation

## 2012-01-16 DIAGNOSIS — F0390 Unspecified dementia without behavioral disturbance: Secondary | ICD-10-CM | POA: Diagnosis not present

## 2012-01-16 DIAGNOSIS — Z7982 Long term (current) use of aspirin: Secondary | ICD-10-CM | POA: Diagnosis not present

## 2012-01-16 DIAGNOSIS — Y9210 Unspecified residential institution as the place of occurrence of the external cause: Secondary | ICD-10-CM | POA: Insufficient documentation

## 2012-01-16 DIAGNOSIS — E119 Type 2 diabetes mellitus without complications: Secondary | ICD-10-CM | POA: Diagnosis not present

## 2012-01-16 DIAGNOSIS — R42 Dizziness and giddiness: Secondary | ICD-10-CM | POA: Diagnosis not present

## 2012-01-16 DIAGNOSIS — Z8659 Personal history of other mental and behavioral disorders: Secondary | ICD-10-CM | POA: Insufficient documentation

## 2012-01-16 DIAGNOSIS — R51 Headache: Secondary | ICD-10-CM | POA: Diagnosis not present

## 2012-01-16 DIAGNOSIS — S0003XA Contusion of scalp, initial encounter: Secondary | ICD-10-CM | POA: Diagnosis not present

## 2012-01-16 DIAGNOSIS — M542 Cervicalgia: Secondary | ICD-10-CM | POA: Diagnosis not present

## 2012-01-16 DIAGNOSIS — W19XXXA Unspecified fall, initial encounter: Secondary | ICD-10-CM | POA: Insufficient documentation

## 2012-01-16 DIAGNOSIS — T148XXA Other injury of unspecified body region, initial encounter: Secondary | ICD-10-CM | POA: Diagnosis not present

## 2012-01-16 DIAGNOSIS — S0993XA Unspecified injury of face, initial encounter: Secondary | ICD-10-CM | POA: Diagnosis not present

## 2012-01-16 DIAGNOSIS — G40909 Epilepsy, unspecified, not intractable, without status epilepticus: Secondary | ICD-10-CM | POA: Diagnosis not present

## 2012-01-16 LAB — URINALYSIS, ROUTINE W REFLEX MICROSCOPIC
Ketones, ur: NEGATIVE mg/dL
Urobilinogen, UA: 1 mg/dL (ref 0.0–1.0)

## 2012-01-16 LAB — POCT I-STAT, CHEM 8
BUN: 17 mg/dL (ref 6–23)
Chloride: 96 mEq/L (ref 96–112)
Creatinine, Ser: 1 mg/dL (ref 0.50–1.10)
Glucose, Bld: 122 mg/dL — ABNORMAL HIGH (ref 70–99)
Potassium: 3.7 mEq/L (ref 3.5–5.1)

## 2012-01-16 LAB — URINE MICROSCOPIC-ADD ON

## 2012-01-16 MED ORDER — ACETAMINOPHEN 325 MG PO TABS
650.0000 mg | ORAL_TABLET | Freq: Once | ORAL | Status: AC
  Administered 2012-01-16: 650 mg via ORAL
  Filled 2012-01-16: qty 2

## 2012-01-16 NOTE — ED Notes (Signed)
Patient transported to CT 

## 2012-01-16 NOTE — ED Notes (Addendum)
Pt. ambulating well with walker to nurse's station and back to room. Reported to EDPA

## 2012-01-16 NOTE — ED Notes (Signed)
As per EMS pt fell from standing.No LOC/N. To acute distress.VSS.pwd. Pt c/o head neck pain.

## 2012-01-16 NOTE — ED Notes (Signed)
Lori from [place] at bedside, report given.

## 2012-01-16 NOTE — ED Notes (Signed)
Pt to be transported by [facility] to [facility].

## 2012-01-16 NOTE — ED Provider Notes (Signed)
History     CSN: [id number]  Arrival date & time 01/16/12  0608   First MD Initiated Contact with Patient 01/16/12 [phone]      Chief Complaint  Patient presents with  . Fall    (Consider location/radiation/quality/duration/timing/severity/associated sxs/prior treatment) HPI  78 year old female with multiple comorbidities presents for evaluations of a fall. Patient is at a nursing facility. She reports while sitting on a chair today she attempted to stand up and subsequently fell to the ground. She is unsure if she had lightheadedness or loss of balance. She did hit her head but denies loss of consciousness. She does complain of mild tenderness to the back of her head, and pain to the right side of the neck. She denies any other symptoms specifically no chest pain no shortness of breath no abdominal pain no back pain or no pain to her extremities. Been no nausea, vomiting, or diarrhea. She denies any urinary symptoms. She usually walks with a walker. She denies taking any blood thinner medication except for a baby aspirin. Patient denies history of fall  Past Medical History  Diagnosis Date  . Phobia   . Macular degeneration   . Narcissism   . Schizophrenia, paranoid   . Fungal dermatitis   . Normocytic anemia   . Hypertension   . Hyperlipidemia   . Mild cognitive impairment   . Diabetes mellitus type 2 in obese   . Stress incontinence, female   . Depressive disorder   . Allergic rhinitis     Past Surgical History  Procedure Date  . Cataract extraction 6 16 2006  . Cryotherapy 2 15 2006    facial AK  . Total abdominal hysterectomy w/ bilateral salpingoophorectomy     due to endometriosis  non malignant  . Hyperplastic  4 20 2006    AK shave biopsy  . Abdominal hysterectomy     due to fibroids    Family History  Problem Relation Age of Onset  . Heart disease Mother   . Stroke Mother   . Heart disease Father   . Stroke Father   . Cancer Cousin     liver and colon     History  Substance Use Topics  . Smoking status: Former Games developer  . Smokeless tobacco: Not on file  . Alcohol Use: No    OB History    Grav Para Term Preterm Abortions TAB SAB Ect Mult Living                  Review of Systems  All other systems reviewed and are negative.    Allergies  Review of patient's allergies indicates no known allergies.  Home Medications   Current Outpatient Rx  Name Route Sig Dispense Refill  . ACETAMINOPHEN ER 650 MG PO TBCR Oral Take 650 mg by mouth every evening.     . ACETAMINOPHEN ER 650 MG PO TBCR Oral Take 650 mg by mouth every 8 (eight) hours as needed. Pain    . ASPIRIN 81 MG PO CHEW Oral Chew 81 mg by mouth daily.      . ATORVASTATIN CALCIUM 20 MG PO TABS Oral Take 1 tablet (20 mg total) by mouth at bedtime. 30 tablet 11  . CALCIUM CARBONATE-VITAMIN D 500-200 MG-UNIT PO TABS Oral Take 1 tablet by mouth 2 (two) times daily.      Marland Kitchen CITALOPRAM HYDROBROMIDE 20 MG PO TABS Oral Take 1.5 tablets (30 mg total) by mouth daily. 45 tablet 5  .  DOCUSATE SODIUM 100 MG PO CAPS Oral Take 100 mg by mouth 2 (two) times daily.      Marland Kitchen FLUTICASONE PROPIONATE 50 MCG/ACT NA SUSP Nasal Place 2 sprays into the nose daily. 2 sprays in each nostril 16 g 5  . KETOCONAZOLE 2 % EX CREA Topical Apply topically daily. Apply to groin and buttocks 2 times a day 15 g 5  . METFORMIN HCL 500 MG PO TABS Oral Take 500 mg by mouth 2 (two) times daily with a meal.    . MULTIVITAMINS PO CAPS Oral Take 1 capsule by mouth daily.      Marland Kitchen OMEPRAZOLE 20 MG PO CPDR Oral Take 1 capsule (20 mg total) by mouth 2 (two) times daily. 60 capsule 5  . TRAMADOL HCL 50 MG PO TABS Oral Take 50 mg by mouth daily.    . TRAMADOL HCL 50 MG PO TABS Oral Take 50 mg by mouth every 8 (eight) hours as needed.    Marland Kitchen VALSARTAN-HYDROCHLOROTHIAZIDE 320-25 MG PO TABS Oral Take 0.5 tablets by mouth 2 (two) times daily. 30 tablet 5    BP 139/64  Pulse 68  Temp 98.3 F (36.8 C) (Oral)  Resp 16  SpO2  92%  Physical Exam  Nursing note and vitals reviewed. Constitutional: She is oriented to person, place, and time. She appears well-developed and well-nourished. No distress.  HENT:  Head: Normocephalic and atraumatic.       Mild tenderness to right occipital region without overlying skin changes, or deformity noted  Eyes: Conjunctivae normal and EOM are normal. Pupils are equal, round, and reactive to light.  Neck: Neck supple.       Mild tenderness to right lateral aspects of neck without overlying skin changes. No cervical midline tenderness, no step-off  Cardiovascular: Normal rate and regular rhythm.  Exam reveals no gallop and no friction rub.   No murmur heard. Pulmonary/Chest: Effort normal and breath sounds normal. She exhibits no tenderness.  Abdominal: Soft. There is no tenderness.  Musculoskeletal: Normal range of motion. She exhibits no edema and no tenderness.       Right shoulder: Normal.       Left shoulder: Normal.       Right hip: Normal.       Left hip: Normal.       Cervical back: Normal.       Thoracic back: Normal.       Lumbar back: Normal.  Neurological: She is alert and oriented to person, place, and time.  Skin: Skin is warm.  Psychiatric: She has a normal mood and affect.    ED Course  Procedures (including critical care time)  Results for orders placed during the hospital encounter of 01/16/12  URINALYSIS, ROUTINE W REFLEX MICROSCOPIC      Component Value Range   Color, Urine YELLOW  YELLOW   APPearance CLEAR  CLEAR   Specific Gravity, Urine 1.014  1.005 - 1.030   pH 7.0  5.0 - 8.0   Glucose, UA NEGATIVE  NEGATIVE mg/dL   Hgb urine dipstick NEGATIVE  NEGATIVE   Bilirubin Urine NEGATIVE  NEGATIVE   Ketones, ur NEGATIVE  NEGATIVE mg/dL   Protein, ur NEGATIVE  NEGATIVE mg/dL   Urobilinogen, UA 1.0  0.0 - 1.0 mg/dL   Nitrite NEGATIVE  NEGATIVE   Leukocytes, UA TRACE (*) NEGATIVE  POCT I-STAT, CHEM 8      Component Value Range   Sodium 134 (*) 135  - 145 mEq/L  Potassium 3.7  3.5 - 5.1 mEq/L   Chloride 96  96 - 112 mEq/L   BUN 17  6 - 23 mg/dL   Creatinine, Ser 1.61  0.50 - 1.10 mg/dL   Glucose, Bld 096 (*) 70 - 99 mg/dL   Calcium, Ion 0.45  4.09 - 1.30 mmol/L   TCO2 28  0 - 100 mmol/L   Hemoglobin 15.3 (*) 12.0 - 15.0 g/dL   HCT 81.1  91.4 - 78.2 %  URINE MICROSCOPIC-ADD ON      Component Value Range   Squamous Epithelial / LPF RARE  RARE   WBC, UA 0-2  <3 WBC/hpf   Bacteria, UA RARE  RARE   Urine-Other AMORPHOUS URATES/PHOSPHATES     Ct Head Wo Contrast  01/16/2012  *RADIOLOGY REPORT*  Clinical Data:  Fall with headache and neck pain.  CT HEAD WITHOUT CONTRAST CT CERVICAL SPINE WITHOUT CONTRAST  Technique:  Multidetector CT imaging of the head and cervical spine was performed following the standard protocol without intravenous contrast.  Multiplanar CT image reconstructions of the cervical spine were also generated.  Comparison:  CT head 08/12/2010 and CT cervical spine 11/16/2009.  CT HEAD  Findings: No evidence of acute infarct, acute hemorrhage, mass lesion, mass effect or hydrocephalus.  Atrophy.  Periventricular low attenuation. No fracture.  Small amount of fluid in the right mastoid air cells, as before.  Visualized portions of the paranasal sinuses and mastoid air cells are clear.  IMPRESSION:  1.  No acute intracranial abnormality. 2.  Atrophy and chronic microvascular white matter ischemic changes. 3.  Chronic small right mastoid effusion.  CT CERVICAL SPINE  Findings: Slight reversal of the normal cervical lordosis.  Minimal grade 1 degenerative anterolisthesis of C4 on C5, measuring 3 mm. Approximately 2 mm of degenerative anterolisthesis of C6 on C7.  No associated fracture.  Vertebral body height is maintained.  Multilevel uncovertebral and facet hypertrophy.  Severe bilateral neural foraminal narrowing at C3-4.  At C4-5, moderate right neural foraminal narrowing and mild left neural foraminal narrowing.  Visualized lung  apices show no acute findings.  Soft tissues are unremarkable.  IMPRESSION: Slight reversal of the normal cervical lordosis with multilevel spondylosis.  No acute findings.   Original Report Authenticated By: Reyes Ivan, M.D.    Ct Cervical Spine Wo Contrast  01/16/2012  *RADIOLOGY REPORT*  Clinical Data:  Fall with headache and neck pain.  CT HEAD WITHOUT CONTRAST CT CERVICAL SPINE WITHOUT CONTRAST  Technique:  Multidetector CT imaging of the head and cervical spine was performed following the standard protocol without intravenous contrast.  Multiplanar CT image reconstructions of the cervical spine were also generated.  Comparison:  CT head 08/12/2010 and CT cervical spine 11/16/2009.  CT HEAD  Findings: No evidence of acute infarct, acute hemorrhage, mass lesion, mass effect or hydrocephalus.  Atrophy.  Periventricular low attenuation. No fracture.  Small amount of fluid in the right mastoid air cells, as before.  Visualized portions of the paranasal sinuses and mastoid air cells are clear.  IMPRESSION:  1.  No acute intracranial abnormality. 2.  Atrophy and chronic microvascular white matter ischemic changes. 3.  Chronic small right mastoid effusion.  CT CERVICAL SPINE  Findings: Slight reversal of the normal cervical lordosis.  Minimal grade 1 degenerative anterolisthesis of C4 on C5, measuring 3 mm. Approximately 2 mm of degenerative anterolisthesis of C6 on C7.  No associated fracture.  Vertebral body height is maintained.  Multilevel uncovertebral and facet hypertrophy.  Severe bilateral neural foraminal narrowing at C3-4.  At C4-5, moderate right neural foraminal narrowing and mild left neural foraminal narrowing.  Visualized lung apices show no acute findings.  Soft tissues are unremarkable.  IMPRESSION: Slight reversal of the normal cervical lordosis with multilevel spondylosis.  No acute findings.   Original Report Authenticated By: Reyes Ivan, M.D.     Date: 01/16/2012  Rate: 65   Rhythm: normal sinus rhythm  QRS Axis: normal  Intervals: PR prolonged  ST/T Wave abnormalities: nonspecific ST/T changes  Conduction Disutrbances:right bundle branch block and left anterior fascicular block  Narrative Interpretation: new RBBB and LAFB  Old EKG Reviewed: changes noted    1. fall  MDM  Patient had a fall from a standing position. She has mild tenderness to the right side of neck and some mild headache. She has no other focal point tenderness. Unsure if this is a mechanical fall. Therefore, We'll check head/cspine CT.  Check electrolytes and H&H, UA, ECG, and orthostatic VS.  Tylenol given for pain.    8:38 AM Patient has normal electrolytes, no evidence of anemia, normal UA, head and cspine CT is unremarkable. EKG shows some nonspecific finding. Her orthostatic vital signs is significant for an increase in heart rate by 18 beats from a lying position to a standing position. Patient is able to tolerates PO.  Will PO hydrates pt and will d/c with strict return precaution and follow up recommendation.  Pt otherwise in NAD. Able to ambulate with walker without difficulty.  Pt has elevated BP, which i recommend recheck at her doctor's office.  Discussed care with my attending.     BP 170/52  Pulse 67  Temp 98.3 F (36.8 C) (Oral)  Resp 15  SpO2 98%  Nursing notes reviewed and considered in documentation  Previous records reviewed and considered  All labs/vitals reviewed and considered  xrays reviewed and considered    Fayrene Helper, PA-C 01/16/12 0848

## 2012-01-16 NOTE — ED Notes (Signed)
Bed:WA22<BR> Expected date:<BR> Expected time:<BR> Means of arrival:<BR> Comments:<BR> EMS

## 2012-01-19 NOTE — ED Provider Notes (Signed)
Medical screening examination/treatment/procedure(s) were conducted as a shared visit with non-physician practitioner(s) and myself.  I personally evaluated the patient during the encounter  Debra Tran is a 77 y.o. female hx of dementia, DM, here with s/p fall. She is from nursing facility and had possible syncope but she didn't quite recall the incidence. + head injury. Exam is nl, CT head/neck showed no fracture. EKG unchanged. Labs nl, UA nl. She was orthostatic and she was hydrated and sent back to nursing home.   Level V caveat- dementia   Richardean Canal, MD 01/19/12 [phone]

## 2012-01-21 ENCOUNTER — Encounter: Payer: Self-pay | Admitting: Family Medicine

## 2012-01-21 ENCOUNTER — Ambulatory Visit (INDEPENDENT_AMBULATORY_CARE_PROVIDER_SITE_OTHER): Payer: Medicare Other | Admitting: Family Medicine

## 2012-01-21 VITALS — BP 148/56 | HR 75 | Temp 97.6°F | Wt 158.0 lb

## 2012-01-21 DIAGNOSIS — R296 Repeated falls: Secondary | ICD-10-CM

## 2012-01-21 DIAGNOSIS — R29818 Other symptoms and signs involving the nervous system: Secondary | ICD-10-CM

## 2012-01-21 DIAGNOSIS — Z23 Encounter for immunization: Secondary | ICD-10-CM

## 2012-01-21 DIAGNOSIS — F0390 Unspecified dementia without behavioral disturbance: Secondary | ICD-10-CM | POA: Diagnosis not present

## 2012-01-21 MED ORDER — DONEPEZIL HCL 5 MG PO TABS: 5.0000 mg | ORAL_TABLET | Freq: Every evening | ORAL | Status: DC | PRN

## 2012-01-21 NOTE — Assessment & Plan Note (Signed)
Discussion with patient and daughter. They decide they would like to try a low dose Aricept for dementia. This might help her remember to drink water more. Will followup within one month. Discussed possible side effects and to monitor clinically.

## 2012-01-21 NOTE — Patient Instructions (Addendum)
You have orthostasis. Take extra time and support when standing and getting out of bed. Drink extra water at least 64 oz daily. Make appointment in one month for check up. Might have to change BP medications if still symptomatic.  Orthostatic Hypotension Orthostatic hypotension is a sudden fall in blood pressure. It occurs when a person goes from a sitting or lying position to a standing position. CAUSES   Loss of body fluids (dehydration).  Medicines that lower blood pressure.  Sudden changes in posture, such as sudden standing when you have been sitting or lying down.  Taking too much of your medicine. SYMPTOMS   Lightheadedness or dizziness.  Fainting or near-fainting.  A fast heart rate (tachycardia).  Weakness.  Feeling tired (fatigue). DIAGNOSIS  Your caregiver may find the cause of orthostatic hypotension through:  A history and/or physical exam.  Checking your blood pressure. Your caregiver will check your blood pressure when you are:  Lying down.  Sitting.  Standing.  Tilt table testing. In this test, you are placed on a table that goes from a lying position to a standing position. You will be strapped to the table. This test helps to monitor your blood pressure and heart rate when you are in different positions. TREATMENT   If orthostatic hypotension is caused by your medicines, your caregiver will need to adjust your dosage. Do not stop or adjust your medicine on your own.  When changing positions, make these changes slowly. This allows your body to adjust to the different position.  Compression stockings that are worn on your lower legs may be helpful.  Your caregiver may have you consume extra salt. Do not add extra salt to your diet unless directed by your caregiver.  Eat frequent, small meals. Avoid sudden standing after eating.  Avoid hot showers or excessive heat.  Your caregiver may give you fluids through the vein (intravenous).  Your  caregiver may put you on medicine to help enhance fluid retention. SEEK IMMEDIATE MEDICAL CARE IF:   You faint or have a near-fainting episode. Call your local emergency services (911 in [place]  You have or develop chest pain.  You feel sick to your stomach (nauseous) or vomit.  You have a loss of feeling or movement in your arms or legs.  You have difficulty talking, slurred speech, or you are unable to talk.  You have difficulty thinking or have confused thinking. MAKE SURE YOU:   Understand these instructions.  Will watch your condition.  Will get help right away if you are not doing well or get worse. Document Released: 03/16/2002 Document Revised: 06/18/2011 Document Reviewed: 07/09/2008 [facility] Patient Information 2013 [place], [place].

## 2012-01-21 NOTE — Assessment & Plan Note (Signed)
Positive symptoms during orthostatics today. Lying 157/70 pulse 85, sitting 176/64 pulse 74, standing 145/62 pulse 86. I have encouraged patient to drink extra water at least 64 ounces per day. Have written orders for facility to keep her ice and water container full. Discussed in detail behavior modification including stabilizing and allowing extra time when standing and getting out of bed. Will consider decreasing her diuretic antihypertensive if this does not improve. Discussed red flags to followup sooner including palpitations, chest pain, syncope, worsening symptoms. Will followup labs next visit including vitamin D and electrolytes.

## 2012-01-21 NOTE — Progress Notes (Signed)
  Subjective:    Patient ID: Debra Tran, female    DOB: 06-14-1927, 78 y.o.   MRN: [id number]  HPI  1. followup ED from fall. She was evaluated in the emergency department with negative CT head and lab studies. She did have an EKG with a right bundle branch block and left anterior fascicular block, no arrhythmias detected. She was given a fluid bolus for orthostasis and felt improved. Patient has difficulty remembering the occurrence, but it seems like she fell either after standing from the commode or standing from her chair. She denies any syncope. She also sometimes thinks her feet get tangled when she turns. She uses her walker for stability and has physical therapy assessment at her assisted-living facility. She does remember sometimes feeling weak and lightheaded when she stands. Her daughter states she sometimes forgets to drink water, and blames this on her dementia. She is no recent medication changes.  Review of Systems  She denies vertigo, palpitations, chest pain, exertional dyspnea, leg numbness or weakness, headache, confusion, edema, dysuria, syncope.    Objective:   Physical Exam  Vitals reviewed. Constitutional: She is oriented to person, place, and time. She appears well-developed and well-nourished. No distress.  HENT:  Head: Normocephalic and atraumatic.  Mouth/Throat: Oropharynx is clear and moist.  Eyes: EOM are normal. Pupils are equal, round, and reactive to light.  Neck: Neck supple.  Cardiovascular: Normal rate, regular rhythm and normal heart sounds.  Exam reveals no gallop.   No murmur heard. Pulmonary/Chest: Effort normal and breath sounds normal. No respiratory distress. She has no wheezes. She has no rales.  Abdominal: Soft. Bowel sounds are normal. She exhibits no distension. There is no tenderness.  Musculoskeletal: She exhibits no edema and no tenderness.  Neurological: She is alert and oriented to person, place, and time. Coordination normal.   Patient ambulates with walker. Gait is quick and symmetric step length and height. Her turn is quick.  Appropriate footwear in place.  Skin: No rash noted. She is not diaphoretic.  Psychiatric: She has a normal mood and affect.       Assessment & Plan:

## 2012-01-23 ENCOUNTER — Telehealth: Payer: Self-pay | Admitting: *Deleted

## 2012-01-23 NOTE — Telephone Encounter (Signed)
Form completed for Diabetic Testing Supplies and faxed to [facility] at [phone].  Ileana Ladd

## 2012-02-05 ENCOUNTER — Other Ambulatory Visit: Payer: Self-pay | Admitting: *Deleted

## 2012-02-05 DIAGNOSIS — K30 Functional dyspepsia: Secondary | ICD-10-CM

## 2012-02-06 MED ORDER — OMEPRAZOLE 20 MG PO CPDR: 20.0000 mg | DELAYED_RELEASE_CAPSULE | Freq: Two times a day (BID) | ORAL | Status: DC

## 2012-02-06 MED ORDER — TRAMADOL HCL 50 MG PO TABS: 50.0000 mg | ORAL_TABLET | Freq: Every day | ORAL | Status: DC

## 2012-03-03 ENCOUNTER — Other Ambulatory Visit: Payer: Self-pay | Admitting: *Deleted

## 2012-03-04 ENCOUNTER — Ambulatory Visit
Admission: RE | Admit: 2012-03-04 | Discharge: 2012-03-04 | Disposition: A | Discharge status: Home / Self Care | Payer: Medicare Other | Source: Ambulatory Visit | Attending: Family Medicine | Admitting: Family Medicine

## 2012-03-04 ENCOUNTER — Ambulatory Visit (INDEPENDENT_AMBULATORY_CARE_PROVIDER_SITE_OTHER): Payer: Medicare Other | Admitting: Family Medicine

## 2012-03-04 ENCOUNTER — Encounter: Payer: Self-pay | Admitting: Family Medicine

## 2012-03-04 VITALS — BP 155/52 | HR 66 | Temp 98.4°F | Wt 156.0 lb

## 2012-03-04 DIAGNOSIS — I10 Essential (primary) hypertension: Secondary | ICD-10-CM | POA: Diagnosis not present

## 2012-03-04 DIAGNOSIS — M25579 Pain in unspecified ankle and joints of unspecified foot: Secondary | ICD-10-CM | POA: Diagnosis not present

## 2012-03-04 DIAGNOSIS — M25571 Pain in right ankle and joints of right foot: Secondary | ICD-10-CM

## 2012-03-04 DIAGNOSIS — M7730 Calcaneal spur, unspecified foot: Secondary | ICD-10-CM | POA: Diagnosis not present

## 2012-03-04 MED ORDER — VALSARTAN-HYDROCHLOROTHIAZIDE 320-25 MG PO TABS: 0.5000 | ORAL_TABLET | Freq: Two times a day (BID) | ORAL | Status: DC

## 2012-03-04 MED ORDER — COLCHICINE 0.6 MG PO TABS: 0.6000 mg | ORAL_TABLET | Freq: Every day | ORAL | Status: DC

## 2012-03-04 NOTE — Patient Instructions (Addendum)
You may have gout flare or just tendonitis. Start taking colchicine as directed. You can use tylenol 650 mg every 6 hours as needed also. Get your xrays done. Make appointment for follow up in 1-2 weeks.

## 2012-03-05 ENCOUNTER — Telehealth: Payer: Self-pay | Admitting: Family Medicine

## 2012-03-05 NOTE — Progress Notes (Signed)
  Subjective:    Patient ID: Debra Tran, female    DOB: 1927/11/12, 78 y.o.   MRN: [id number]  HPI  1. Right foot pain. Started acutely yesterday upon wakening she noticed pain in the arch area and right lateral malleolus. She and her daughter state she has previous dx of gout and this seems similar, last attack many years ago and treated by podiatrist. Pain is moderate, worse with standing, ankle feels "stiff." no alleviating or modifying factors. No therapies have been tried. She is still able to ambulate with her walker.   Review of Systems patient denies any swelling, injury, falls, redness, fever, chills, rash, calf or leg pain, numbness or weakness.     Objective:   Physical Exam  Vitals reviewed. Constitutional: She appears well-developed and well-nourished. No distress.  HENT:  Head: Normocephalic and atraumatic.  Mouth/Throat: Oropharynx is clear and moist.  Eyes: EOM are normal.  Pulmonary/Chest: Effort normal.  Musculoskeletal: She exhibits tenderness. She exhibits no edema.       TTP in right lateral malleolar area and right distal plantar fascia and metatarsal pad areas. No focal TTP, no erythema, warmth or edema.  There is decreased active and passive ankle ROM secondary to pain.  Has 1st MTP bunion nontender, slight redness. No calf TTP or edema.  No joint effusions DP pulse intact. Normal capillary refill and distal sensation. No ulcers noted. She has several toe pads to prevent rubbing in shoes.   Skin: She is not diaphoretic.       Assessment & Plan:

## 2012-03-05 NOTE — Assessment & Plan Note (Signed)
Acute onset, non-traumatic and non-focal. May represent acute gout flare if she indeed has history (do not see any confirmatory testing). Also consider a plantar fascitis or flare of osteoarthritis. Check plain films of foot and ankle to evaluate for any joint disease or inflammatory changes. Will treat empirically with colchicine x 2 days. Recommend tylenol prn in addition, as well as elevation. Would avoid NSAIDs in this elderly patient. There are no systemic signs or joint effusions to suggest infectious etiology. Advised f/u in 1-2 weeks if not improved.

## 2012-03-05 NOTE — Telephone Encounter (Signed)
Normal xray patients ankle. There is mild spurring. Advise f/u in 1-2 weeks if not improving.

## 2012-03-21 ENCOUNTER — Other Ambulatory Visit: Payer: Self-pay | Admitting: *Deleted

## 2012-03-21 DIAGNOSIS — F0390 Unspecified dementia without behavioral disturbance: Secondary | ICD-10-CM

## 2012-03-21 MED ORDER — DONEPEZIL HCL 5 MG PO TABS: 5.0000 mg | ORAL_TABLET | Freq: Every evening | ORAL | Status: DC | PRN

## 2012-04-08 DIAGNOSIS — Q828 Other specified congenital malformations of skin: Secondary | ICD-10-CM | POA: Diagnosis not present

## 2012-04-08 DIAGNOSIS — L608 Other nail disorders: Secondary | ICD-10-CM | POA: Diagnosis not present

## 2012-04-08 DIAGNOSIS — E1149 Type 2 diabetes mellitus with other diabetic neurological complication: Secondary | ICD-10-CM | POA: Diagnosis not present

## 2012-04-18 DIAGNOSIS — E1159 Type 2 diabetes mellitus with other circulatory complications: Secondary | ICD-10-CM | POA: Diagnosis not present

## 2012-04-18 DIAGNOSIS — M6281 Muscle weakness (generalized): Secondary | ICD-10-CM | POA: Diagnosis not present

## 2012-04-18 DIAGNOSIS — R279 Unspecified lack of coordination: Secondary | ICD-10-CM | POA: Diagnosis not present

## 2012-04-21 DIAGNOSIS — M6281 Muscle weakness (generalized): Secondary | ICD-10-CM | POA: Diagnosis not present

## 2012-04-21 DIAGNOSIS — E1159 Type 2 diabetes mellitus with other circulatory complications: Secondary | ICD-10-CM | POA: Diagnosis not present

## 2012-04-21 DIAGNOSIS — R279 Unspecified lack of coordination: Secondary | ICD-10-CM | POA: Diagnosis not present

## 2012-04-22 ENCOUNTER — Other Ambulatory Visit: Payer: Self-pay | Admitting: *Deleted

## 2012-04-22 DIAGNOSIS — E1159 Type 2 diabetes mellitus with other circulatory complications: Secondary | ICD-10-CM | POA: Diagnosis not present

## 2012-04-22 DIAGNOSIS — R279 Unspecified lack of coordination: Secondary | ICD-10-CM | POA: Diagnosis not present

## 2012-04-22 DIAGNOSIS — M6281 Muscle weakness (generalized): Secondary | ICD-10-CM | POA: Diagnosis not present

## 2012-04-22 DIAGNOSIS — F0390 Unspecified dementia without behavioral disturbance: Secondary | ICD-10-CM

## 2012-04-22 MED ORDER — DONEPEZIL HCL 5 MG PO TABS: 5.0000 mg | ORAL_TABLET | Freq: Every evening | ORAL | Status: DC | PRN

## 2012-04-23 DIAGNOSIS — R279 Unspecified lack of coordination: Secondary | ICD-10-CM | POA: Diagnosis not present

## 2012-04-23 DIAGNOSIS — E1159 Type 2 diabetes mellitus with other circulatory complications: Secondary | ICD-10-CM | POA: Diagnosis not present

## 2012-04-23 DIAGNOSIS — M6281 Muscle weakness (generalized): Secondary | ICD-10-CM | POA: Diagnosis not present

## 2012-04-24 DIAGNOSIS — E1159 Type 2 diabetes mellitus with other circulatory complications: Secondary | ICD-10-CM | POA: Diagnosis not present

## 2012-04-24 DIAGNOSIS — M6281 Muscle weakness (generalized): Secondary | ICD-10-CM | POA: Diagnosis not present

## 2012-04-24 DIAGNOSIS — R279 Unspecified lack of coordination: Secondary | ICD-10-CM | POA: Diagnosis not present

## 2012-04-25 DIAGNOSIS — R279 Unspecified lack of coordination: Secondary | ICD-10-CM | POA: Diagnosis not present

## 2012-04-25 DIAGNOSIS — M6281 Muscle weakness (generalized): Secondary | ICD-10-CM | POA: Diagnosis not present

## 2012-04-25 DIAGNOSIS — E1159 Type 2 diabetes mellitus with other circulatory complications: Secondary | ICD-10-CM | POA: Diagnosis not present

## 2012-04-28 DIAGNOSIS — E1159 Type 2 diabetes mellitus with other circulatory complications: Secondary | ICD-10-CM | POA: Diagnosis not present

## 2012-04-28 DIAGNOSIS — M6281 Muscle weakness (generalized): Secondary | ICD-10-CM | POA: Diagnosis not present

## 2012-04-28 DIAGNOSIS — R279 Unspecified lack of coordination: Secondary | ICD-10-CM | POA: Diagnosis not present

## 2012-04-30 DIAGNOSIS — E1159 Type 2 diabetes mellitus with other circulatory complications: Secondary | ICD-10-CM | POA: Diagnosis not present

## 2012-04-30 DIAGNOSIS — M6281 Muscle weakness (generalized): Secondary | ICD-10-CM | POA: Diagnosis not present

## 2012-04-30 DIAGNOSIS — R279 Unspecified lack of coordination: Secondary | ICD-10-CM | POA: Diagnosis not present

## 2012-05-01 DIAGNOSIS — R279 Unspecified lack of coordination: Secondary | ICD-10-CM | POA: Diagnosis not present

## 2012-05-01 DIAGNOSIS — E1159 Type 2 diabetes mellitus with other circulatory complications: Secondary | ICD-10-CM | POA: Diagnosis not present

## 2012-05-01 DIAGNOSIS — M6281 Muscle weakness (generalized): Secondary | ICD-10-CM | POA: Diagnosis not present

## 2012-05-02 DIAGNOSIS — E1159 Type 2 diabetes mellitus with other circulatory complications: Secondary | ICD-10-CM | POA: Diagnosis not present

## 2012-05-02 DIAGNOSIS — M6281 Muscle weakness (generalized): Secondary | ICD-10-CM | POA: Diagnosis not present

## 2012-05-02 DIAGNOSIS — R279 Unspecified lack of coordination: Secondary | ICD-10-CM | POA: Diagnosis not present

## 2012-05-05 ENCOUNTER — Other Ambulatory Visit: Payer: Self-pay | Admitting: *Deleted

## 2012-05-05 DIAGNOSIS — R279 Unspecified lack of coordination: Secondary | ICD-10-CM | POA: Diagnosis not present

## 2012-05-05 DIAGNOSIS — E1159 Type 2 diabetes mellitus with other circulatory complications: Secondary | ICD-10-CM | POA: Diagnosis not present

## 2012-05-05 DIAGNOSIS — M6281 Muscle weakness (generalized): Secondary | ICD-10-CM | POA: Diagnosis not present

## 2012-05-05 MED ORDER — METFORMIN HCL 500 MG PO TABS: 500.0000 mg | ORAL_TABLET | Freq: Two times a day (BID) | ORAL | Status: DC

## 2012-05-06 DIAGNOSIS — R279 Unspecified lack of coordination: Secondary | ICD-10-CM | POA: Diagnosis not present

## 2012-05-06 DIAGNOSIS — E1159 Type 2 diabetes mellitus with other circulatory complications: Secondary | ICD-10-CM | POA: Diagnosis not present

## 2012-05-06 DIAGNOSIS — M6281 Muscle weakness (generalized): Secondary | ICD-10-CM | POA: Diagnosis not present

## 2012-05-07 DIAGNOSIS — E1159 Type 2 diabetes mellitus with other circulatory complications: Secondary | ICD-10-CM | POA: Diagnosis not present

## 2012-05-07 DIAGNOSIS — M6281 Muscle weakness (generalized): Secondary | ICD-10-CM | POA: Diagnosis not present

## 2012-05-07 DIAGNOSIS — R279 Unspecified lack of coordination: Secondary | ICD-10-CM | POA: Diagnosis not present

## 2012-05-08 DIAGNOSIS — E1159 Type 2 diabetes mellitus with other circulatory complications: Secondary | ICD-10-CM | POA: Diagnosis not present

## 2012-05-08 DIAGNOSIS — M6281 Muscle weakness (generalized): Secondary | ICD-10-CM | POA: Diagnosis not present

## 2012-05-08 DIAGNOSIS — R279 Unspecified lack of coordination: Secondary | ICD-10-CM | POA: Diagnosis not present

## 2012-05-09 DIAGNOSIS — E1159 Type 2 diabetes mellitus with other circulatory complications: Secondary | ICD-10-CM | POA: Diagnosis not present

## 2012-05-09 DIAGNOSIS — R279 Unspecified lack of coordination: Secondary | ICD-10-CM | POA: Diagnosis not present

## 2012-05-09 DIAGNOSIS — M6281 Muscle weakness (generalized): Secondary | ICD-10-CM | POA: Diagnosis not present

## 2012-05-12 DIAGNOSIS — E1159 Type 2 diabetes mellitus with other circulatory complications: Secondary | ICD-10-CM | POA: Diagnosis not present

## 2012-05-12 DIAGNOSIS — M6281 Muscle weakness (generalized): Secondary | ICD-10-CM | POA: Diagnosis not present

## 2012-05-12 DIAGNOSIS — R279 Unspecified lack of coordination: Secondary | ICD-10-CM | POA: Diagnosis not present

## 2012-05-13 DIAGNOSIS — E1159 Type 2 diabetes mellitus with other circulatory complications: Secondary | ICD-10-CM | POA: Diagnosis not present

## 2012-05-13 DIAGNOSIS — R279 Unspecified lack of coordination: Secondary | ICD-10-CM | POA: Diagnosis not present

## 2012-05-13 DIAGNOSIS — M6281 Muscle weakness (generalized): Secondary | ICD-10-CM | POA: Diagnosis not present

## 2012-05-14 DIAGNOSIS — E1159 Type 2 diabetes mellitus with other circulatory complications: Secondary | ICD-10-CM | POA: Diagnosis not present

## 2012-05-14 DIAGNOSIS — R279 Unspecified lack of coordination: Secondary | ICD-10-CM | POA: Diagnosis not present

## 2012-05-14 DIAGNOSIS — M6281 Muscle weakness (generalized): Secondary | ICD-10-CM | POA: Diagnosis not present

## 2012-05-19 DIAGNOSIS — E1159 Type 2 diabetes mellitus with other circulatory complications: Secondary | ICD-10-CM | POA: Diagnosis not present

## 2012-05-19 DIAGNOSIS — R279 Unspecified lack of coordination: Secondary | ICD-10-CM | POA: Diagnosis not present

## 2012-05-19 DIAGNOSIS — M6281 Muscle weakness (generalized): Secondary | ICD-10-CM | POA: Diagnosis not present

## 2012-05-21 DIAGNOSIS — R279 Unspecified lack of coordination: Secondary | ICD-10-CM | POA: Diagnosis not present

## 2012-05-21 DIAGNOSIS — E1159 Type 2 diabetes mellitus with other circulatory complications: Secondary | ICD-10-CM | POA: Diagnosis not present

## 2012-05-21 DIAGNOSIS — M6281 Muscle weakness (generalized): Secondary | ICD-10-CM | POA: Diagnosis not present

## 2012-05-23 DIAGNOSIS — M6281 Muscle weakness (generalized): Secondary | ICD-10-CM | POA: Diagnosis not present

## 2012-05-23 DIAGNOSIS — E1159 Type 2 diabetes mellitus with other circulatory complications: Secondary | ICD-10-CM | POA: Diagnosis not present

## 2012-05-23 DIAGNOSIS — R279 Unspecified lack of coordination: Secondary | ICD-10-CM | POA: Diagnosis not present

## 2012-05-26 DIAGNOSIS — R279 Unspecified lack of coordination: Secondary | ICD-10-CM | POA: Diagnosis not present

## 2012-05-26 DIAGNOSIS — E1159 Type 2 diabetes mellitus with other circulatory complications: Secondary | ICD-10-CM | POA: Diagnosis not present

## 2012-05-26 DIAGNOSIS — M6281 Muscle weakness (generalized): Secondary | ICD-10-CM | POA: Diagnosis not present

## 2012-05-28 DIAGNOSIS — M6281 Muscle weakness (generalized): Secondary | ICD-10-CM | POA: Diagnosis not present

## 2012-05-28 DIAGNOSIS — E1159 Type 2 diabetes mellitus with other circulatory complications: Secondary | ICD-10-CM | POA: Diagnosis not present

## 2012-05-28 DIAGNOSIS — R279 Unspecified lack of coordination: Secondary | ICD-10-CM | POA: Diagnosis not present

## 2012-05-29 ENCOUNTER — Telehealth: Payer: Self-pay | Admitting: Family Medicine

## 2012-05-29 ENCOUNTER — Ambulatory Visit (INDEPENDENT_AMBULATORY_CARE_PROVIDER_SITE_OTHER): Payer: Medicare Other | Admitting: Family Medicine

## 2012-05-29 ENCOUNTER — Encounter: Payer: Self-pay | Admitting: Family Medicine

## 2012-05-29 ENCOUNTER — Ambulatory Visit (HOSPITAL_COMMUNITY)
Admission: RE | Admit: 2012-05-29 | Discharge: 2012-05-29 | Disposition: A | Discharge status: Home / Self Care | Payer: Medicare Other | Source: Ambulatory Visit | Attending: Family Medicine | Admitting: Family Medicine

## 2012-05-29 VITALS — BP 135/73 | HR 76 | Temp 97.7°F | Ht 60.0 in | Wt 156.0 lb

## 2012-05-29 DIAGNOSIS — F209 Schizophrenia, unspecified: Secondary | ICD-10-CM

## 2012-05-29 DIAGNOSIS — R300 Dysuria: Secondary | ICD-10-CM

## 2012-05-29 DIAGNOSIS — I4510 Unspecified right bundle-branch block: Secondary | ICD-10-CM | POA: Diagnosis not present

## 2012-05-29 DIAGNOSIS — Z79899 Other long term (current) drug therapy: Secondary | ICD-10-CM

## 2012-05-29 LAB — POCT URINALYSIS DIPSTICK
Glucose, UA: NEGATIVE
Nitrite, UA: NEGATIVE
Protein, UA: NEGATIVE
Urobilinogen, UA: 1

## 2012-05-29 MED ORDER — QUETIAPINE FUMARATE 25 MG PO TABS: ORAL_TABLET | ORAL | Status: DC

## 2012-05-29 NOTE — Progress Notes (Signed)
I saw Debra Tran with Dr Tye Savoy and agree with her plan of care.

## 2012-05-29 NOTE — Patient Instructions (Addendum)
Please start taking Seroquel 25 mg every 12 hours for 2 days, then one tablet daily for 4 weeks. Schedule follow up appointment with Dr. Sheffield Slider in 4 weeks or sooner if symptoms worsen.

## 2012-05-29 NOTE — Assessment & Plan Note (Signed)
Consider increasing Aricept to 10 mg at future visit.  Will defer to PCP.

## 2012-05-29 NOTE — Assessment & Plan Note (Signed)
Symptoms could be secondary to underlying schizophrenia vs. Dementia with psychotic features vs. Delirium.  Start Seroquel load 50 mg x 2 days, then 25 mg daily. Will get EKG today for baseline QT interval. Follow up in 4 weeks in GERI clinic or sooner if needed.

## 2012-05-29 NOTE — Assessment & Plan Note (Signed)
Will check [facility] today.

## 2012-05-29 NOTE — Telephone Encounter (Signed)
Patient is coming in to see Dr. Sheffield Slider this morning.

## 2012-05-29 NOTE — Telephone Encounter (Signed)
Patient is exhibiting violant behaviour so they would like an order for a UA.
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
## 2012-05-30 ENCOUNTER — Telehealth: Payer: Self-pay | Admitting: Family Medicine

## 2012-05-30 DIAGNOSIS — R279 Unspecified lack of coordination: Secondary | ICD-10-CM | POA: Diagnosis not present

## 2012-05-30 DIAGNOSIS — E1159 Type 2 diabetes mellitus with other circulatory complications: Secondary | ICD-10-CM | POA: Diagnosis not present

## 2012-05-30 DIAGNOSIS — M6281 Muscle weakness (generalized): Secondary | ICD-10-CM | POA: Diagnosis not present

## 2012-05-30 NOTE — Telephone Encounter (Signed)
Will fwd. To Dr.Konkol to clarify. Debra Tran, Debra Tran

## 2012-05-30 NOTE — Telephone Encounter (Signed)
There is a miscommunication with how exactly Debra Tran was to take the Seroquel. On sig it states : Please take one tablet every 12 hours for 2 days, then take one tablet daily. , but on order given to nursing home it states take two daily for two days, then once daily thereafter. Patient is very incoherent this morning  after taking meds. Was seen in geri clinic.  ,

## 2012-05-30 NOTE — Telephone Encounter (Signed)
Please give order to keep seroquel at 25 mg PO daily and to avoid taking 50 mg since she was lethargic.

## 2012-05-30 NOTE — Telephone Encounter (Signed)
Called. Left message to call back. Please see Dr.Konkol's message. Thanks, .Arlyss Repress

## 2012-05-30 NOTE — Telephone Encounter (Signed)
Debra Tran with [facility] left message on Pharmacy/physician line requesting clarification on order they received yesterday.  Returned call. Spoke with [name] and informed her to have patient take Seroquel 25 mg po daily.  Needs order faxed over to them at [phone].  Will have preceptor to write new order since Dr. Cristal Ford is not in the clinic this afternoon.  Order written by Dr. Deirdre Priest and faxed to [facility] at [phone].   Ileana Ladd

## 2012-05-31 ENCOUNTER — Encounter (HOSPITAL_COMMUNITY): Payer: Self-pay

## 2012-05-31 ENCOUNTER — Other Ambulatory Visit: Payer: Self-pay

## 2012-05-31 ENCOUNTER — Emergency Department (HOSPITAL_COMMUNITY)
Admission: EM | Admit: 2012-05-31 | Discharge: 2012-06-01 | Disposition: A | Discharge status: Home / Self Care | Payer: Medicare Other | Attending: Emergency Medicine | Admitting: Emergency Medicine

## 2012-05-31 ENCOUNTER — Emergency Department (HOSPITAL_COMMUNITY): Payer: Medicare Other

## 2012-05-31 ENCOUNTER — Telehealth: Payer: Self-pay | Admitting: Family Medicine

## 2012-05-31 DIAGNOSIS — F489 Nonpsychotic mental disorder, unspecified: Secondary | ICD-10-CM | POA: Diagnosis not present

## 2012-05-31 DIAGNOSIS — E785 Hyperlipidemia, unspecified: Secondary | ICD-10-CM | POA: Insufficient documentation

## 2012-05-31 DIAGNOSIS — F0390 Unspecified dementia without behavioral disturbance: Secondary | ICD-10-CM | POA: Insufficient documentation

## 2012-05-31 DIAGNOSIS — F29 Unspecified psychosis not due to a substance or known physiological condition: Secondary | ICD-10-CM | POA: Diagnosis not present

## 2012-05-31 DIAGNOSIS — F22 Delusional disorders: Secondary | ICD-10-CM | POA: Insufficient documentation

## 2012-05-31 DIAGNOSIS — I10 Essential (primary) hypertension: Secondary | ICD-10-CM | POA: Diagnosis not present

## 2012-05-31 DIAGNOSIS — Z79899 Other long term (current) drug therapy: Secondary | ICD-10-CM | POA: Diagnosis not present

## 2012-05-31 DIAGNOSIS — Z7982 Long term (current) use of aspirin: Secondary | ICD-10-CM | POA: Diagnosis not present

## 2012-05-31 DIAGNOSIS — Z87891 Personal history of nicotine dependence: Secondary | ICD-10-CM | POA: Diagnosis not present

## 2012-05-31 DIAGNOSIS — R4182 Altered mental status, unspecified: Secondary | ICD-10-CM | POA: Diagnosis not present

## 2012-05-31 DIAGNOSIS — E119 Type 2 diabetes mellitus without complications: Secondary | ICD-10-CM | POA: Insufficient documentation

## 2012-05-31 DIAGNOSIS — Z862 Personal history of diseases of the blood and blood-forming organs and certain disorders involving the immune mechanism: Secondary | ICD-10-CM | POA: Insufficient documentation

## 2012-05-31 DIAGNOSIS — Z8669 Personal history of other diseases of the nervous system and sense organs: Secondary | ICD-10-CM | POA: Insufficient documentation

## 2012-05-31 LAB — COMPREHENSIVE METABOLIC PANEL
BUN: 22 mg/dL (ref 6–23)
CO2: 26 mEq/L (ref 19–32)
Calcium: 9.1 mg/dL (ref 8.4–10.5)
Chloride: 94 mEq/L — ABNORMAL LOW (ref 96–112)
Creatinine, Ser: 0.81 mg/dL (ref 0.50–1.10)
GFR calc non Af Amer: 66 mL/min — ABNORMAL LOW (ref >90)
Total Bilirubin: 0.4 mg/dL (ref 0.3–1.2)

## 2012-05-31 LAB — CBC
HCT: 34.4 % — ABNORMAL LOW (ref 36.0–46.0)
MCH: 26.4 pg (ref 26.0–34.0)
MCV: 80.9 fL (ref 78.0–100.0)
RBC: 4.25 MIL/uL (ref 3.87–5.11)
RDW: 13.5 % (ref 11.5–15.5)
WBC: 7 10*3/uL (ref 4.0–10.5)

## 2012-05-31 LAB — URINALYSIS, ROUTINE W REFLEX MICROSCOPIC
Leukocytes, UA: NEGATIVE
Nitrite: NEGATIVE
Protein, ur: NEGATIVE mg/dL
Specific Gravity, Urine: 1.014 (ref 1.005–1.030)
Urobilinogen, UA: 1 mg/dL (ref 0.0–1.0)

## 2012-05-31 LAB — GLUCOSE, CAPILLARY: Glucose-Capillary: 111 mg/dL — ABNORMAL HIGH (ref 70–99)

## 2012-05-31 LAB — RAPID URINE DRUG SCREEN, HOSP PERFORMED
Opiates: NOT DETECTED
Tetrahydrocannabinol: NOT DETECTED

## 2012-05-31 MED ORDER — ZIPRASIDONE MESYLATE 20 MG IM SOLR
10.0000 mg | Freq: Once | INTRAMUSCULAR | Status: AC
  Administered 2012-05-31: 10 mg via INTRAMUSCULAR
  Filled 2012-05-31: qty 20

## 2012-05-31 MED ORDER — IOHEXOL 300 MG/ML  SOLN: 100.0000 mL | Freq: Once | INTRAMUSCULAR | Status: AC | PRN

## 2012-05-31 MED ORDER — ZIPRASIDONE MESYLATE 20 MG IM SOLR
10.0000 mg | Freq: Once | INTRAMUSCULAR | Status: AC
  Administered 2012-05-31: 10 mg via INTRAMUSCULAR

## 2012-05-31 NOTE — ED Notes (Signed)
MD at bedside. 

## 2012-05-31 NOTE — ED Provider Notes (Signed)
History     CSN: [id number]  Arrival date & time 05/31/12  1439   First MD Initiated Contact with Patient 05/31/12 1512      Chief Complaint  Patient presents with  . Altered Mental Status    (Consider location/radiation/quality/duration/timing/severity/associated sxs/prior treatment) HPI This 79 year old has a history of dementia as well as schizophrenia, she apparently has not been psychiatric medicines for years, she lives in assisted living facility, she now has a delusion that she has an angel for the last couple of days and is refusing to eat or drink anything because angels tonight eat or drink, she also refuses medications because angels to any medications, she denies any threats or herself or others denies any hallucinations but states that everyone else is an angel as well, she denies headache neck pain back pain chest pain shortness breath abdominal pain vomiting diarrhea or other concerns. She denies any focal or lateralizing weakness numbness or incoordination. She is apparently disoriented because she says that angels have no need to track time or place.  Past Medical History  Diagnosis Date  . Phobia   . Macular degeneration   . Narcissism   . Schizophrenia, paranoid   . Fungal dermatitis   . Normocytic anemia   . Hypertension   . Hyperlipidemia   . Mild cognitive impairment   . Diabetes mellitus type 2 in obese   . Stress incontinence, female   . Depressive disorder   . Allergic rhinitis     Past Surgical History  Procedure Laterality Date  . Cataract extraction  6 16 2006  . Cryotherapy  2 15 2006    facial AK  . Total abdominal hysterectomy w/ bilateral salpingoophorectomy      due to endometriosis  non malignant  . Hyperplastic   4 20 2006    AK shave biopsy  . Abdominal hysterectomy      due to fibroids    Family History  Problem Relation Age of Onset  . Heart disease Mother   . Stroke Mother   . Heart disease Father   . Stroke Father   .  Cancer Cousin     liver and colon    History  Substance Use Topics  . Smoking status: Former Games developer  . Smokeless tobacco: Not on file  . Alcohol Use: No    OB History   Grav Para Term Preterm Abortions TAB SAB Ect Mult Living                  Review of Systems  Unable to perform ROS: Mental status change    Allergies  Review of patient's allergies indicates no known allergies.  Home Medications   Current Outpatient Rx  Name  Route  Sig  Dispense  Refill  . acetaminophen (TYLENOL) 650 MG CR tablet   Oral   Take 650 mg by mouth every evening.          . Artificial Tear Solution (TEARS NATURALE FORTE) 0.1-0.2-0.3 % SOLN   Both Eyes   Place 1 drop into both eyes 3 (three) times daily. For dry eyes         . aspirin EC 81 MG tablet   Oral   Take 81 mg by mouth daily.         Marland Kitchen atorvastatin (LIPITOR) 20 MG tablet   Oral   Take 1 tablet (20 mg total) by mouth at bedtime.   30 tablet   11   . Calcium Carbonate-Vitamin  D (CALCIUM 600-D) 600-400 MG-UNIT per tablet   Oral   Take 1 tablet by mouth 2 (two) times daily.         Marland Kitchen docusate sodium (COLACE) 100 MG capsule   Oral   Take 100 mg by mouth 2 (two) times daily.           Marland Kitchen donepezil (ARICEPT) 5 MG tablet   Oral   Take 5 mg by mouth at bedtime.         . Emollient (CERAVE) CREA   Apply externally   Apply 1 application topically daily. Apply to affected area         . fluticasone (FLONASE) 50 MCG/ACT nasal spray   Nasal   Place 2 sprays into the nose daily.         Marland Kitchen ketoconazole (NIZORAL) 2 % cream   Topical   Apply topically daily. Apply to groin and buttocks 2 times a day   15 g   5   . loperamide (IMODIUM) 2 MG capsule   Oral   Take 2 mg by mouth 4 (four) times daily as needed for diarrhea or loose stools.         . menthol-zinc oxide (GOLD BOND) powder   Topical   Apply 1 application topically 2 (two) times daily.         . metFORMIN (GLUCOPHAGE) 500 MG tablet   Oral    Take 1 tablet (500 mg total) by mouth 2 (two) times daily with a meal.   180 tablet   1   . Multiple Vitamin (MULTIVITAMIN) capsule   Oral   Take 1 capsule by mouth daily.           Marland Kitchen omeprazole (PRILOSEC) 20 MG capsule   Oral   Take 1 capsule (20 mg total) by mouth 2 (two) times daily.   60 capsule   5   . tacrolimus (PROTOPIC) 0.1 % ointment   Topical   Apply 1 application topically at bedtime. Apply to buttocks         . valsartan-hydrochlorothiazide (DIOVAN HCT) 320-25 MG per tablet   Oral   Take 0.5 tablets by mouth 2 (two) times daily.   30 tablet   5   . Zinc Oxide (TRIPLE PASTE EX)   Apply externally   Apply 1 application topically 2 (two) times daily. Apply to affected areas         . ziprasidone (GEODON) 20 MG capsule   Oral   Take 1 capsule (20 mg total) by mouth at bedtime. Extra dose to be given this afternoon.   15 capsule   0     BP 154/61  Pulse 66  Temp(Src) 97.8 F (36.6 C) (Oral)  Resp 20  SpO2 94%  Physical Exam  Nursing note and vitals reviewed. Constitutional:  Awake, alert, nontoxic appearance with baseline speech for patient.  HENT:  Head: Atraumatic.  Mouth/Throat: No oropharyngeal exudate.  Eyes: EOM are normal. Pupils are equal, round, and reactive to light. Right eye exhibits no discharge. Left eye exhibits no discharge.  Neck: Neck supple.  Cardiovascular: Normal rate and regular rhythm.   No murmur heard. Pulmonary/Chest: Effort normal and breath sounds normal. No stridor. No respiratory distress. She has no wheezes. She has no rales. She exhibits no tenderness.  Abdominal: Soft. Bowel sounds are normal. She exhibits no mass. There is no tenderness. There is no rebound.  Musculoskeletal: She exhibits no tenderness.  Baseline ROM, moves extremities with  no obvious new focal weakness.  Lymphadenopathy:    She has no cervical adenopathy.  Neurological: She is alert.  Awake, alert, somewhat cooperative and oriented to person  only; motor strength 5/5 bilaterally; sensation normal to light touch bilaterally; peripheral visual fields full to confrontation; no facial asymmetry; tongue midline; major cranial nerves appear intact; no pronator drift, normal finger to nose bilaterally, baseline gait without new ataxia.  Skin: No rash noted.  Psychiatric: She has a normal mood and affect.  Patient is delusional    ED Course  Procedures (including critical care time) ECG: Sinus rhythm, ventricular rate 93, first degree AV block, PR interval 224 ms, nonspecific intraventricular conduction delay, left axis deviation, QRS duration 130 ms, RSR prime pattern in lead V2, no significant change noted compared with 05/29/2012  Patient presents with psychosis difficult to determine if this is primary psychosis versus dementia with psychosis versus less likely delirium. Will consult tele-psychiatry to determine whether or not patient needs medical versus psychiatric admission. Family / Caregiver understand and agree with initial ED impression and plan with expectations set for ED visit.  Patient became agitated combative fighting trying to walk out of the ED despite multiple verbal reassurance efforts patient refused follow commands, refused CT scan, tried to run out of the emergency department multiple times, try to climb out of the wheelchair, try to climb out of bed, started fighting pushing hitting and yelling became completely uncooperative requiring chemical restraint and involuntary commitment. Patient has a history of mild dementia but patient's daughter does not have power of attorney. I was with Pt prior to, during, and after chemical restraint. 1620  Restraints removed when Pt calmed in ED.  Pt back to baseline for Telepsych eval, daughter happy with rescind IVC for Pt and discharge.  CRITICAL CARE Performed by: Hurman Horn   Total critical care time:  Critical care time was exclusive of separately billable  procedures and treating other patients.  Critical care was necessary to treat or prevent imminent or life-threatening deterioration.  Critical care was time spent personally by me on the following activities: development of treatment plan with patient and/or surrogate as well as nursing, discussions with consultants, evaluation of patient's response to treatment, examination of patient, obtaining history from patient or surrogate, ordering and performing treatments and interventions, ordering and review of laboratory studies, ordering and review of radiographic studies, pulse oximetry and re-evaluation of patient's condition.   Labs Reviewed  GLUCOSE, CAPILLARY - Abnormal; Notable for the following:    Glucose-Capillary 111 (*)    All other components within normal limits  CBC - Abnormal; Notable for the following:    Hemoglobin 11.2 (*)    HCT 34.4 (*)    All other components within normal limits  COMPREHENSIVE METABOLIC PANEL - Abnormal; Notable for the following:    Sodium 133 (*)    Chloride 94 (*)    Glucose, Bld 155 (*)    GFR calc non Af Amer 66 (*)    GFR calc Af Amer 76 (*)    All other components within normal limits  GLUCOSE, CAPILLARY - Abnormal; Notable for the following:    Glucose-Capillary 176 (*)    All other components within normal limits  URINALYSIS, ROUTINE W REFLEX MICROSCOPIC  URINE RAPID DRUG SCREEN (HOSP PERFORMED)   No results found.   1. Psychosis   2. Dementia       MDM  Pt improved in ED with no significant deterioration in condition.  Patient /  Family / Caregiver informed of clinical course, understand medical decision-making process, and agree with plan.  I doubt any other EMC precluding discharge at this time including, but not necessarily limited to the following:sepsis, CVA.        Hurman Horn, MD 06/09/12 [phone]

## 2012-05-31 NOTE — Telephone Encounter (Signed)
Received after hours emergency line call from patient's daughter:   Patient lives in assisted living.  Was seen by Dr. Sheffield Slider in clinc on 05/30/11.  Was started on Seroquel for concern that worsening confusion due to schizophrenia.  Over last 24 hours, patient has started acting very strange, claiming there are angels, stealing people's glasses.  She received the Seroquel last night.    Called and spoke to [facility] assisted living.  They can take a fax order to d/c Seroquel.  They are agreeable to seeing if her delusions and behavior improve with stopping this mediation.  I advised transfer to ER if she is not improving in 12-24 hours, or is a danger to herself or others.  Advised she follow up in clinic early next week.    Fax #: [phone] Phone  [phone]

## 2012-05-31 NOTE — ED Notes (Signed)
Patient back from CT.

## 2012-05-31 NOTE — ED Notes (Addendum)
Pt has become increasingly uncooperative and hostile.  Pt taken to Xray and tried to leave through EMS bay.  Stopped my ED staff and security.  MD notified.    MD is filling out IVC paperwork.

## 2012-05-31 NOTE — ED Notes (Addendum)
Per EMS, Pt, from [facility], is having altered mental status and believes that she "is an angel and needs not treatment."  Alert & Ox4, but sts that she "is an angel."  The facility told EMS that the Pt has been acting all day.  Denies pain.  Vitals are stable.  Recently, started taking a new medication, Seroquel.  Hx of schizophrenia.

## 2012-05-31 NOTE — ED Notes (Signed)
Dr Trisha Mangle, telepsych md called in reference to pt.  telepsych in progress

## 2012-05-31 NOTE — ED Notes (Signed)
JYN:[id number]<AO> Expected date:05/31/12<BR> Expected time: 2:38 PM<BR> Means of arrival:Ambulance<BR> Comments:<BR> Med clearance

## 2012-05-31 NOTE — ED Notes (Signed)
Pt uncooperative and stating " I am an angel" she is refusing to let us do anything stating "angels don't get sick".

## 2012-05-31 NOTE — ED Notes (Signed)
Called to check status on pt telepsych.  Pt family made a ware of wait time.

## 2012-06-01 MED ORDER — ZIPRASIDONE HCL 20 MG PO CAPS: 20.0000 mg | ORAL_CAPSULE | Freq: Every day | ORAL | Status: DC

## 2012-06-01 MED ORDER — SODIUM CHLORIDE 0.9 % IV BOLUS (SEPSIS)
500.0000 mL | Freq: Once | INTRAVENOUS | Status: AC
  Administered 2012-06-01: 500 mL via INTRAVENOUS

## 2012-06-01 MED ORDER — SODIUM CHLORIDE 0.9 % IV SOLN: INTRAVENOUS | Status: DC

## 2012-06-01 NOTE — ED Notes (Signed)
Debra Tran, Consulting civil engineer at bedside.  Pt family upset.  Pt family request to speak with someone in reference to telepsych.  Pt family states "i think this is ridiculous to have a computer rolled up and no doctor in the flesh to come see about my mother.  i want to speak with someone in higher up because If i was the patient, I would kick the computer."  Charge rn at bedside explaining prpoer procedure for Ed psych eval.

## 2012-06-01 NOTE — ED Notes (Signed)
Dr. Bednar at bedside. 

## 2012-06-01 NOTE — ED Notes (Signed)
Pt daughter left without signing pt out

## 2012-06-01 NOTE — ED Notes (Signed)
Called [employer] Public relations account executive with laura, med tech that will be seeing after pt.  Staff advised that pt will be returning back to their facility

## 2012-06-01 NOTE — ED Notes (Signed)
Pt/family/[facility] staff, laura verbalizes understanding

## 2012-06-02 ENCOUNTER — Telehealth: Payer: Self-pay | Admitting: Family Medicine

## 2012-06-02 ENCOUNTER — Other Ambulatory Visit: Payer: Self-pay | Admitting: Family Medicine

## 2012-06-02 DIAGNOSIS — F22 Delusional disorders: Secondary | ICD-10-CM

## 2012-06-02 DIAGNOSIS — F329 Major depressive disorder, single episode, unspecified: Secondary | ICD-10-CM

## 2012-06-02 MED ORDER — ZIPRASIDONE HCL 20 MG PO CAPS: 20.0000 mg | ORAL_CAPSULE | Freq: Every day | ORAL | Status: DC

## 2012-06-02 NOTE — Assessment & Plan Note (Signed)
I called back her daughter, [name], who reports that despite the Geodon prescribed by the telepsychiatrist at the time of the [facility] ER visit, her mother today still believe that she is an Chief Technology Officer. She dismantled a Copy and is advising other residents of [facility] to stop using their assistive devices. I told [name] that I would fax in an additional dose of Geodon 20 mg for this afternoon and continue the evening dose of 20 mg. I will call tomorrow to check on how she is doing and [name] will also give me a report. We'll consider adding Valproate. I'll look into the status of her possible evaluation in [place]. Her daughter will schedule her mother in Geriatric Clinic this Thursday. I also asked them to stop Citalopram and Tramadol due to potential interactions. Also consider whether the Citalopram uncovered a pre - existing bipolar tendency considering her history of "schizophrenia" and daughter and grandson's ADD diagnoses.

## 2012-06-02 NOTE — Telephone Encounter (Signed)
Pt was given seroquil last week and is not doing well at all - Wants to be referred to [facility] - ASAP

## 2012-06-02 NOTE — Telephone Encounter (Signed)
Patient was seen in ER over weekend for delusional thinking with history of schizophrenia. I agree with outpatient psych referral which is ordered. She is currently on geodon prescribed by ER.

## 2012-06-02 NOTE — Telephone Encounter (Signed)
I called back her daughter, [name], who reports that despite the Geodon prescribed by the telepsychiatrist at the time of the [facility] ER visit, her mother today still believe that she is an angel. She dismantled a bulletin board and is advising other residents of [place] to stop using their assistive devices. I told [name] that I would fax in an additional dose of Geodon 20 mg for this afternoon and continue the evening dose of 20 mg. I will call tomorrow to check on how she is doing and [name] will also give me a report. We'll consider adding Valproate. I'll look into the status of her possible evaluation in [place]. Her daughter will schedule her mother in Geriatric Clinic this Thursday. I also asked them to stop Citalopram and Tramadol due to potential interactions. Also consider whether the Citalopram uncovered a pre - existing bipolar tendency considering her history of "schizophrenia" and daughter and grandson's ADD diagnoses.  

## 2012-06-03 ENCOUNTER — Telehealth: Payer: Self-pay | Admitting: *Deleted

## 2012-06-03 ENCOUNTER — Telehealth: Payer: Self-pay | Admitting: Family Medicine

## 2012-06-03 NOTE — Telephone Encounter (Signed)
Daughter called needing orders to be sent to [facility] - wants to get her in today [phone] [name] is the daughter Needs this to be done ASAP

## 2012-06-03 NOTE — Telephone Encounter (Signed)
Pt's daughter walked into clinic with paper work. She request paper work to be filled out. I told pt's daughter that I will give the paper to Dr.Hale after clinic today. Lorenda Hatchet, Renato Battles

## 2012-06-03 NOTE — Telephone Encounter (Signed)
At her daughter, Jimmy Footman, request, I completed a petition for involuntary commitment.

## 2012-06-04 DIAGNOSIS — S1093XA Contusion of unspecified part of neck, initial encounter: Secondary | ICD-10-CM | POA: Diagnosis not present

## 2012-06-04 DIAGNOSIS — S0990XA Unspecified injury of head, initial encounter: Secondary | ICD-10-CM | POA: Diagnosis not present

## 2012-06-04 DIAGNOSIS — D649 Anemia, unspecified: Secondary | ICD-10-CM | POA: Diagnosis present

## 2012-06-04 DIAGNOSIS — F209 Schizophrenia, unspecified: Secondary | ICD-10-CM | POA: Diagnosis not present

## 2012-06-04 DIAGNOSIS — F200 Paranoid schizophrenia: Secondary | ICD-10-CM | POA: Diagnosis not present

## 2012-06-04 DIAGNOSIS — H3530 Unspecified macular degeneration: Secondary | ICD-10-CM | POA: Diagnosis present

## 2012-06-04 DIAGNOSIS — M1990 Unspecified osteoarthritis, unspecified site: Secondary | ICD-10-CM | POA: Diagnosis present

## 2012-06-04 DIAGNOSIS — E119 Type 2 diabetes mellitus without complications: Secondary | ICD-10-CM | POA: Diagnosis present

## 2012-06-04 DIAGNOSIS — E785 Hyperlipidemia, unspecified: Secondary | ICD-10-CM | POA: Diagnosis present

## 2012-06-04 DIAGNOSIS — I10 Essential (primary) hypertension: Secondary | ICD-10-CM | POA: Diagnosis present

## 2012-06-04 DIAGNOSIS — F0390 Unspecified dementia without behavioral disturbance: Secondary | ICD-10-CM | POA: Diagnosis present

## 2012-06-04 DIAGNOSIS — F29 Unspecified psychosis not due to a substance or known physiological condition: Secondary | ICD-10-CM | POA: Diagnosis not present

## 2012-06-04 NOTE — Telephone Encounter (Signed)
Please give verbal order to [facility] this. I thougth our urgent inpatient referral yesterday would have accomplished this.

## 2012-06-04 NOTE — Telephone Encounter (Signed)
Referral was faxed to [facility] on Monday and to [facility] yesterday.  [facility] told me the daughter was on her way here yesterday to have the doctor fill out paperwork for magistrates office but to my knowledge no forms have been dropped off.  Ileana Ladd

## 2012-06-05 ENCOUNTER — Ambulatory Visit: Payer: Medicare Other

## 2012-06-05 NOTE — Telephone Encounter (Signed)
I have checked my box and do not have any forms.

## 2012-06-10 NOTE — Telephone Encounter (Signed)
Pt's daughter requested copy of form faxed to her that Dr Sheffield Slider filled out,copy of forms were faxed to [facility] pt's daughter and she was informed. Proposito, [name]

## 2012-06-25 ENCOUNTER — Ambulatory Visit (INDEPENDENT_AMBULATORY_CARE_PROVIDER_SITE_OTHER): Payer: Medicare Other | Admitting: Ophthalmology

## 2012-06-25 ENCOUNTER — Other Ambulatory Visit: Payer: Self-pay | Admitting: Family Medicine

## 2012-06-25 MED ORDER — OLANZAPINE 5 MG PO TABS
5.0000 mg | ORAL_TABLET | Freq: Every day | ORAL | Status: DC
Start: 2012-06-25 — End: 2014-01-27

## 2012-06-25 MED ORDER — ESCITALOPRAM OXALATE 10 MG PO TABS: 10.0000 mg | ORAL_TABLET | Freq: Every day | ORAL | Status: DC

## 2012-07-05 DIAGNOSIS — N390 Urinary tract infection, site not specified: Secondary | ICD-10-CM | POA: Diagnosis not present

## 2012-07-08 ENCOUNTER — Telehealth: Payer: Self-pay | Admitting: Family Medicine

## 2012-07-08 DIAGNOSIS — Q828 Other specified congenital malformations of skin: Secondary | ICD-10-CM | POA: Diagnosis not present

## 2012-07-08 DIAGNOSIS — E1149 Type 2 diabetes mellitus with other diabetic neurological complication: Secondary | ICD-10-CM | POA: Diagnosis not present

## 2012-07-08 DIAGNOSIS — L608 Other nail disorders: Secondary | ICD-10-CM | POA: Diagnosis not present

## 2012-07-08 NOTE — Telephone Encounter (Signed)
Called to say that her mom has been moved back to [facility] but she is still having hallucinations still and the [facility] place she was at did not recommend for her to f/u with any place here for [facility].  Wants to know if Dr Sheffield Slider can recommend a place for her to go.

## 2012-07-09 ENCOUNTER — Other Ambulatory Visit: Payer: Self-pay | Admitting: Family Medicine

## 2012-07-09 DIAGNOSIS — F209 Schizophrenia, unspecified: Secondary | ICD-10-CM

## 2012-07-09 NOTE — Telephone Encounter (Signed)
Received call back from Azaylea's daughter.  When she called Dr. Caprice Renshaw office they informed her they were not taking Medicaid for another two weeks.  Daughter is very upset.  I offered to call [facility] to see if we could get her scheduled there.  I called [facility] and they could take her but it would be the end of May before they could get her scheduled.  I advised [place]'s daughter at this point I would need to send Dr. Cristal Ford a note to see if she has any other ideas or suggestions on who to referral Ms. Orvan Falconer to.  Ileana Ladd

## 2012-07-09 NOTE — Telephone Encounter (Signed)
Discussed with Dr. Pascal Lux and would recommend Dr. Donell Beers for geriatric Psychiatry. Can we make this referral if he is accepting patients?

## 2012-07-09 NOTE — Telephone Encounter (Signed)
Called Dr. Caprice Renshaw office.  He is taking new patients but the family must call and get registered with Billing/Insurance Department first.  There is a $150.00 up front fee.  Information given to [place].  Advised to call [phone] to make arrangements.  Ileana Ladd, CMA

## 2012-07-10 NOTE — Telephone Encounter (Signed)
Spoke with patient's daughter Debra Tran, 2 days ago was having some hallucinations/atypical thoughts. Talking about hearing men's voices, trying to cut her own hair/toenails. But she did complete a podiatry visit and yesterday Debra Tran did not get any calls from ALF, indicating that she did not have any issues. She moved to a quieter room without a room-mate.  Recommend she establish with Dr. Donell Beers at [facility], or she may present to [facility] Debra Tran refuses stating she went there 2 weeks ago prior to going to [facility] inpatient).  She can bring her in here for medication adjustment while waiting.

## 2012-07-11 ENCOUNTER — Ambulatory Visit (INDEPENDENT_AMBULATORY_CARE_PROVIDER_SITE_OTHER): Payer: Medicare Other | Admitting: Ophthalmology

## 2012-07-11 ENCOUNTER — Telehealth: Payer: Self-pay | Admitting: Family Medicine

## 2012-07-11 DIAGNOSIS — I10 Essential (primary) hypertension: Secondary | ICD-10-CM | POA: Diagnosis not present

## 2012-07-11 DIAGNOSIS — H2700 Aphakia, unspecified eye: Secondary | ICD-10-CM

## 2012-07-11 DIAGNOSIS — H43819 Vitreous degeneration, unspecified eye: Secondary | ICD-10-CM

## 2012-07-11 DIAGNOSIS — H35359 Cystoid macular degeneration, unspecified eye: Secondary | ICD-10-CM | POA: Diagnosis not present

## 2012-07-11 DIAGNOSIS — H3530 Unspecified macular degeneration: Secondary | ICD-10-CM | POA: Diagnosis not present

## 2012-07-11 DIAGNOSIS — H35039 Hypertensive retinopathy, unspecified eye: Secondary | ICD-10-CM

## 2012-07-11 NOTE — Telephone Encounter (Signed)
[facility] is calling to let the nurse know that the daughter is requesting the urine sample and that is why they requested it.

## 2012-07-11 NOTE — Telephone Encounter (Signed)
Daughter is calling to let the doctors know that yesterday her mom has decided that she has Tuberculosis and won't get out of her jammies because she is too sick, won't allow people around her for fear of making them sick, also, the other day, they found her in her room sobbing because she though her daughter had died.  Her daughter feels like there have been some very severe changes very quickly and thinks that increasing her Schizophrenia meds is a good idea.

## 2012-07-11 NOTE — Telephone Encounter (Signed)
Via fax number to [facility] [phone].

## 2012-07-11 NOTE — Telephone Encounter (Signed)
Returned call to [place] again. She may give one extra dose of 20 mg ziprasidone per day as needed if still having agitation. This is the medication that seems to have been started while at [facility]. This is still a relatively low dose. I will send order over to ALF.

## 2012-07-12 ENCOUNTER — Encounter (HOSPITAL_COMMUNITY): Payer: Self-pay | Admitting: Emergency Medicine

## 2012-07-12 ENCOUNTER — Emergency Department (HOSPITAL_COMMUNITY)
Admission: EM | Admit: 2012-07-12 | Discharge: 2012-07-14 | Disposition: A | Discharge status: Other Acute Inpatient Hospital | Payer: Medicare Other | Attending: Emergency Medicine | Admitting: Emergency Medicine

## 2012-07-12 DIAGNOSIS — Z79899 Other long term (current) drug therapy: Secondary | ICD-10-CM | POA: Diagnosis not present

## 2012-07-12 DIAGNOSIS — E860 Dehydration: Secondary | ICD-10-CM | POA: Diagnosis not present

## 2012-07-12 DIAGNOSIS — Z8619 Personal history of other infectious and parasitic diseases: Secondary | ICD-10-CM | POA: Diagnosis not present

## 2012-07-12 DIAGNOSIS — E785 Hyperlipidemia, unspecified: Secondary | ICD-10-CM | POA: Insufficient documentation

## 2012-07-12 DIAGNOSIS — Z87448 Personal history of other diseases of urinary system: Secondary | ICD-10-CM | POA: Diagnosis not present

## 2012-07-12 DIAGNOSIS — Z87891 Personal history of nicotine dependence: Secondary | ICD-10-CM | POA: Diagnosis not present

## 2012-07-12 DIAGNOSIS — Z7982 Long term (current) use of aspirin: Secondary | ICD-10-CM | POA: Insufficient documentation

## 2012-07-12 DIAGNOSIS — Z862 Personal history of diseases of the blood and blood-forming organs and certain disorders involving the immune mechanism: Secondary | ICD-10-CM | POA: Insufficient documentation

## 2012-07-12 DIAGNOSIS — IMO0002 Reserved for concepts with insufficient information to code with codable children: Secondary | ICD-10-CM | POA: Insufficient documentation

## 2012-07-12 DIAGNOSIS — Z90710 Acquired absence of both cervix and uterus: Secondary | ICD-10-CM | POA: Insufficient documentation

## 2012-07-12 DIAGNOSIS — R197 Diarrhea, unspecified: Secondary | ICD-10-CM | POA: Insufficient documentation

## 2012-07-12 DIAGNOSIS — E162 Hypoglycemia, unspecified: Secondary | ICD-10-CM

## 2012-07-12 DIAGNOSIS — Z8709 Personal history of other diseases of the respiratory system: Secondary | ICD-10-CM | POA: Insufficient documentation

## 2012-07-12 DIAGNOSIS — E669 Obesity, unspecified: Secondary | ICD-10-CM | POA: Insufficient documentation

## 2012-07-12 DIAGNOSIS — I10 Essential (primary) hypertension: Secondary | ICD-10-CM | POA: Insufficient documentation

## 2012-07-12 DIAGNOSIS — F200 Paranoid schizophrenia: Secondary | ICD-10-CM | POA: Insufficient documentation

## 2012-07-12 DIAGNOSIS — Z8669 Personal history of other diseases of the nervous system and sense organs: Secondary | ICD-10-CM | POA: Insufficient documentation

## 2012-07-12 DIAGNOSIS — F29 Unspecified psychosis not due to a substance or known physiological condition: Secondary | ICD-10-CM

## 2012-07-12 DIAGNOSIS — E1169 Type 2 diabetes mellitus with other specified complication: Secondary | ICD-10-CM | POA: Diagnosis not present

## 2012-07-12 DIAGNOSIS — R443 Hallucinations, unspecified: Secondary | ICD-10-CM | POA: Diagnosis not present

## 2012-07-12 DIAGNOSIS — R4182 Altered mental status, unspecified: Secondary | ICD-10-CM | POA: Diagnosis not present

## 2012-07-12 DIAGNOSIS — R109 Unspecified abdominal pain: Secondary | ICD-10-CM | POA: Insufficient documentation

## 2012-07-12 NOTE — ED Notes (Signed)
Patient alert to self. Patient able to follow commands. Patient not willing to answer questions. CBG checked on arrival 3.

## 2012-07-12 NOTE — ED Notes (Signed)
Per EMS. Patient from [facility]. EMS called because patient was hypoglycemic CBG of 57 and refusing to eat x3 days. EMS reports they received a CBG of 73. Patient initially refusing vitals for EMS, EMS offered patient applesauce, patient refused. Patient reports to patient she does not care if she dies. Patient has a hx of schizophrenia.

## 2012-07-12 NOTE — ED Notes (Signed)
Bed:WA21<BR> Expected date:<BR> Expected time:<BR> Means of arrival:<BR> Comments:<BR>

## 2012-07-13 ENCOUNTER — Emergency Department (HOSPITAL_COMMUNITY): Payer: Medicare Other

## 2012-07-13 DIAGNOSIS — I10 Essential (primary) hypertension: Secondary | ICD-10-CM | POA: Diagnosis not present

## 2012-07-13 DIAGNOSIS — E162 Hypoglycemia, unspecified: Secondary | ICD-10-CM | POA: Diagnosis not present

## 2012-07-13 DIAGNOSIS — R4182 Altered mental status, unspecified: Secondary | ICD-10-CM | POA: Diagnosis not present

## 2012-07-13 LAB — CBC WITH DIFFERENTIAL/PLATELET
Basophils Absolute: 0.1 10*3/uL (ref 0.0–0.1)
Basophils Relative: 1 % (ref 0–1)
Eosinophils Relative: 0 % (ref 0–5)
HCT: 36.2 % (ref 36.0–46.0)
MCHC: 32.6 g/dL (ref 30.0–36.0)
Monocytes Absolute: 0.6 10*3/uL (ref 0.1–1.0)
Neutro Abs: 5.2 10*3/uL (ref 1.7–7.7)
Platelets: 272 10*3/uL (ref 150–400)
RDW: 14.2 % (ref 11.5–15.5)

## 2012-07-13 LAB — URINALYSIS, ROUTINE W REFLEX MICROSCOPIC
Ketones, ur: 40 mg/dL — AB
Leukocytes, UA: NEGATIVE
Nitrite: NEGATIVE
Protein, ur: NEGATIVE mg/dL
Urobilinogen, UA: 1 mg/dL (ref 0.0–1.0)

## 2012-07-13 LAB — COMPREHENSIVE METABOLIC PANEL
ALT: 16 U/L (ref 0–35)
Albumin: 3.5 g/dL (ref 3.5–5.2)
Alkaline Phosphatase: 72 U/L (ref 39–117)
Calcium: 9.2 mg/dL (ref 8.4–10.5)
Creatinine, Ser: 1.09 mg/dL (ref 0.50–1.10)
GFR calc Af Amer: 53 mL/min — ABNORMAL LOW (ref >90)
GFR calc non Af Amer: 46 mL/min — ABNORMAL LOW (ref >90)
Total Bilirubin: 0.4 mg/dL (ref 0.3–1.2)

## 2012-07-13 LAB — GLUCOSE, CAPILLARY
Glucose-Capillary: 74 mg/dL (ref 70–99)
Glucose-Capillary: 86 mg/dL (ref 70–99)
Glucose-Capillary: 84 mg/dL (ref 70–99)
Glucose-Capillary: 74 mg/dL (ref 70–99)
Glucose-Capillary: 75 mg/dL (ref 70–99)

## 2012-07-13 LAB — RAPID URINE DRUG SCREEN, HOSP PERFORMED
Barbiturates: NOT DETECTED
Tetrahydrocannabinol: NOT DETECTED

## 2012-07-13 LAB — CLOSTRIDIUM DIFFICILE BY PCR: Toxigenic C. Difficile by PCR: NEGATIVE

## 2012-07-13 MED ORDER — SODIUM CHLORIDE 0.9 % IV BOLUS (SEPSIS)
500.0000 mL | Freq: Once | INTRAVENOUS | Status: AC
  Administered 2012-07-13: via INTRAVENOUS

## 2012-07-13 MED ORDER — DEXTROSE 50 % IV SOLN
50.0000 mL | Freq: Once | INTRAVENOUS | Status: AC
  Administered 2012-07-13: 50 mL via INTRAVENOUS
  Filled 2012-07-13: qty 50

## 2012-07-13 MED ORDER — DONEPEZIL HCL 5 MG PO TABS
5.0000 mg | ORAL_TABLET | Freq: Every day | ORAL | Status: DC
  Filled 2012-07-13 (×2): qty 1

## 2012-07-13 MED ORDER — HYDROCHLOROTHIAZIDE 12.5 MG PO CAPS
12.5000 mg | ORAL_CAPSULE | Freq: Two times a day (BID) | ORAL | Status: DC
  Filled 2012-07-13 (×4): qty 1

## 2012-07-13 MED ORDER — ESCITALOPRAM OXALATE 10 MG PO TABS: 10.0000 mg | ORAL_TABLET | Freq: Every day | ORAL | Status: DC

## 2012-07-13 MED ORDER — ONDANSETRON HCL 4 MG PO TABS: 4.0000 mg | ORAL_TABLET | Freq: Three times a day (TID) | ORAL | Status: DC | PRN

## 2012-07-13 MED ORDER — ATORVASTATIN CALCIUM 20 MG PO TABS
20.0000 mg | ORAL_TABLET | Freq: Every day | ORAL | Status: DC
  Filled 2012-07-13 (×2): qty 1

## 2012-07-13 MED ORDER — VALSARTAN-HYDROCHLOROTHIAZIDE 320-25 MG PO TABS: 0.5000 | ORAL_TABLET | Freq: Two times a day (BID) | ORAL | Status: DC

## 2012-07-13 MED ORDER — OLANZAPINE 5 MG PO TABS: 5.0000 mg | ORAL_TABLET | Freq: Every day | ORAL | Status: DC

## 2012-07-13 MED ORDER — METFORMIN HCL 500 MG PO TABS
500.0000 mg | ORAL_TABLET | Freq: Two times a day (BID) | ORAL | Status: DC
  Filled 2012-07-13 (×5): qty 1

## 2012-07-13 MED ORDER — IRBESARTAN 150 MG PO TABS
150.0000 mg | ORAL_TABLET | Freq: Two times a day (BID) | ORAL | Status: DC
  Filled 2012-07-13 (×4): qty 1

## 2012-07-13 MED ORDER — ACETAMINOPHEN 325 MG PO TABS: 650.0000 mg | ORAL_TABLET | ORAL | Status: DC | PRN

## 2012-07-13 MED ORDER — ZIPRASIDONE HCL 20 MG PO CAPS: 20.0000 mg | ORAL_CAPSULE | Freq: Two times a day (BID) | ORAL | Status: DC | PRN

## 2012-07-13 MED ORDER — ADULT MULTIVITAMIN W/MINERALS CH: 1.0000 | ORAL_TABLET | Freq: Every morning | ORAL | Status: DC

## 2012-07-13 MED ORDER — PANTOPRAZOLE SODIUM 40 MG PO TBEC: 40.0000 mg | DELAYED_RELEASE_TABLET | Freq: Every day | ORAL | Status: DC

## 2012-07-13 MED ORDER — ASPIRIN EC 81 MG PO TBEC
81.0000 mg | DELAYED_RELEASE_TABLET | Freq: Every morning | ORAL | Status: DC
  Filled 2012-07-13 (×2): qty 1

## 2012-07-13 NOTE — ED Notes (Signed)
Pt continues to refuse food/drink  

## 2012-07-13 NOTE — ED Provider Notes (Signed)
Psychiatric consult complete. Patient needs admission to severe depression hopelessness and not eating.  BP 110/61  Pulse 55  Temp(Src) 97.8 F (36.6 C) (Oral)  Resp 18  SpO2 96%   Glynn Octave, MD 07/13/12 1338

## 2012-07-13 NOTE — ED Notes (Signed)
Patient transported to CT 

## 2012-07-13 NOTE — ED Notes (Signed)
Attempted to give pt medication pt stated she could not swallow and did not want to take her medication.

## 2012-07-13 NOTE — ED Provider Notes (Signed)
History     CSN: [id number]  Arrival date & time 07/12/12  2149   First MD Initiated Contact with Patient 07/12/12 2302      Chief Complaint  Patient presents with  . Hypoglycemia    (Consider location/radiation/quality/duration/timing/severity/associated sxs/prior treatment) HPI Pt recently d/c'd from [facility] for delusions and refusal to take PO's. Has been refusing to eat for the past 3 days and drinking very little. Pt states she is not eating because she is going to die anyway, per daughter. Pt admits to hearing voices. +diarrhea for several days. No fever, chills. No N/V. EMS called and found to be hypoglycemic.  Past Medical History  Diagnosis Date  . Phobia   . Macular degeneration   . Narcissism   . Schizophrenia, paranoid   . Fungal dermatitis   . Normocytic anemia   . Hypertension   . Hyperlipidemia   . Mild cognitive impairment   . Diabetes mellitus type 2 in obese   . Stress incontinence, female   . Depressive disorder   . Allergic rhinitis     Past Surgical History  Procedure Laterality Date  . Cataract extraction  6 16 2006  . Cryotherapy  2 15 2006    facial AK  . Total abdominal hysterectomy w/ bilateral salpingoophorectomy      due to endometriosis  non malignant  . Hyperplastic   4 20 2006    AK shave biopsy  . Abdominal hysterectomy      due to fibroids    Family History  Problem Relation Age of Onset  . Heart disease Mother   . Stroke Mother   . Heart disease Father   . Stroke Father   . Cancer Cousin     liver and colon    History  Substance Use Topics  . Smoking status: Former Games developer  . Smokeless tobacco: Not on file  . Alcohol Use: No    OB History   Grav Para Term Preterm Abortions TAB SAB Ect Mult Living                  Review of Systems  Constitutional: Negative for fever and chills.  HENT: Negative for neck pain.   Respiratory: Negative for cough and shortness of breath.   Cardiovascular: Negative for chest pain.   Gastrointestinal: Positive for abdominal pain and diarrhea. Negative for nausea, vomiting and constipation.  Genitourinary: Negative for dysuria.  Musculoskeletal: Negative for myalgias and back pain.  Skin: Negative for rash and wound.  Neurological: Negative for dizziness, weakness, light-headedness, numbness and headaches.  Psychiatric/Behavioral: Positive for hallucinations, behavioral problems and confusion.  All other systems reviewed and are negative.    Allergies  Review of patient's allergies indicates no known allergies.  Home Medications   Current Outpatient Rx  Name  Route  Sig  Dispense  Refill  . aspirin EC 81 MG tablet   Oral   Take 81 mg by mouth every morning.          Marland Kitchen atorvastatin (LIPITOR) 20 MG tablet   Oral   Take 1 tablet (20 mg total) by mouth at bedtime.   30 tablet   11   . Calcium Carbonate-Vitamin D (CALCIUM 600-D) 600-400 MG-UNIT per tablet   Oral   Take 1 tablet by mouth every morning.          . donepezil (ARICEPT) 5 MG tablet   Oral   Take 5 mg by mouth at bedtime.         Marland Kitchen  escitalopram (LEXAPRO) 10 MG tablet   Oral   Take 10 mg by mouth at bedtime.         . fluticasone (FLONASE) 50 MCG/ACT nasal spray   Nasal   Place 2 sprays into the nose every morning.          Marland Kitchen ketoconazole (NIZORAL) 2 % cream   Topical   Apply 1 application topically 2 (two) times daily. Apply to groin and buttocks 2 times a day         . metFORMIN (GLUCOPHAGE) 500 MG tablet   Oral   Take 1 tablet (500 mg total) by mouth 2 (two) times daily with a meal.   180 tablet   1   . Multiple Vitamin (MULTIVITAMIN WITH MINERALS) TABS   Oral   Take 1 tablet by mouth every morning.         Marland Kitchen OLANZapine (ZYPREXA) 5 MG tablet   Oral   Take 1 tablet (5 mg total) by mouth at bedtime.         Marland Kitchen omeprazole (PRILOSEC) 20 MG capsule   Oral   Take 1 capsule (20 mg total) by mouth 2 (two) times daily.   60 capsule   5   .  valsartan-hydrochlorothiazide (DIOVAN-HCT) 320-25 MG per tablet   Oral   Take 1 tablet by mouth every evening.         . ziprasidone (GEODON) 20 MG capsule   Oral   Take 20 mg by mouth 2 (two) times daily as needed. Take 1 tablet at bedtime. May take 1 additional tablet daily as needed for hallucinations           BP 110/61  Pulse 55  Temp(Src) 97.8 F (36.6 C) (Oral)  Resp 18  SpO2 96%  Physical Exam  Nursing note and vitals reviewed. Constitutional: She is oriented to person, place, and time. She appears well-developed and well-nourished. No distress.  HENT:  Head: Normocephalic and atraumatic.  Dry MM  Eyes: EOM are normal. Pupils are equal, round, and reactive to light.  Neck: Normal range of motion. Neck supple.  Cardiovascular: Normal rate and regular rhythm.   Pulmonary/Chest: Effort normal and breath sounds normal. No respiratory distress. She has no wheezes. She has no rales. She exhibits no tenderness.  Abdominal: Soft. Bowel sounds are normal. She exhibits no distension and no mass. There is no tenderness. There is no rebound and no guarding.  Musculoskeletal: Normal range of motion. She exhibits no edema and no tenderness.  Neurological: She is alert and oriented to person, place, and time.  Skin: Skin is warm and dry. No rash noted. No erythema.  Psychiatric: She has a normal mood and affect. Her behavior is normal.    ED Course  Procedures (including critical care time)  Labs Reviewed  CBC WITH DIFFERENTIAL - Abnormal; Notable for the following:    Hemoglobin 11.8 (*)    All other components within normal limits  COMPREHENSIVE METABOLIC PANEL - Abnormal; Notable for the following:    Potassium 3.3 (*)    Glucose, Bld 61 (*)    BUN 35 (*)    GFR calc non Af Amer 46 (*)    GFR calc Af Amer 53 (*)    All other components within normal limits  URINALYSIS, ROUTINE W REFLEX MICROSCOPIC - Abnormal; Notable for the following:    Bilirubin Urine SMALL (*)     Ketones, ur 40 (*)    All other components within normal limits  GLUCOSE, CAPILLARY - Abnormal; Notable for the following:    Glucose-Capillary 216 (*)    All other components within normal limits  GLUCOSE, CAPILLARY - Abnormal; Notable for the following:    Glucose-Capillary 68 (*)    All other components within normal limits  GLUCOSE, CAPILLARY - Abnormal; Notable for the following:    Glucose-Capillary 69 (*)    All other components within normal limits  CLOSTRIDIUM DIFFICILE BY PCR  ETHANOL  URINE RAPID DRUG SCREEN (HOSP PERFORMED)  GLUCOSE, CAPILLARY  GLUCOSE, CAPILLARY  GLUCOSE, CAPILLARY  GLUCOSE, CAPILLARY  GLUCOSE, CAPILLARY  GLUCOSE, CAPILLARY  GLUCOSE, CAPILLARY   Ct Head Wo Contrast  07/13/2012  *RADIOLOGY REPORT*  Clinical Data: Hypoglycemia  CT HEAD WITHOUT CONTRAST  Technique:  Contiguous axial images were obtained from the base of the skull through the vertex without contrast.  Comparison: 05/31/2012  Findings:   Mild atrophy. There is no evidence of acute intracranial hemorrhage, brain edema, mass lesion, acute infarction,   mass effect, or midline shift. Acute infarct may be inapparent on noncontrast CT.  No other intra-axial abnormalities are seen, and the ventricles and sulci are within normal limits in size and symmetry.   No abnormal extra-axial fluid collections or masses are identified.  No significant calvarial abnormality.  IMPRESSION: 1. Negative for bleed or other acute intracranial process.   Original Report Authenticated By: D. Andria Rhein, MD    Dg Chest Port 1 View  07/13/2012  *RADIOLOGY REPORT*  Clinical Data: Altered mental status, hypertension.  PORTABLE CHEST - 1 VIEW  Comparison: 05/31/2012  Findings: Heart size upper limits normal.  Lungs clear.  No effusion.  Regional bones unremarkable.  IMPRESSION:  1.  No acute disease   Original Report Authenticated By: D. Andria Rhein, MD      1. Psychosis   2. Hypoglycemia      Date: 07/13/2012  Rate: 60   Rhythm: normal sinus rhythm  QRS Axis: normal  Intervals: normal  ST/T Wave abnormalities: normal  Conduction Disutrbances:RBBB  Narrative Interpretation:   Old EKG Reviewed: unchanged    MDM  Eval'd by ACT. Will attempt to place at [facility]. Pt is medically cleared.         Loren Racer, MD 07/14/12 [phone]

## 2012-07-13 NOTE — ED Notes (Signed)
Pt keeps repeating that she is dead and therefore she cannot drink or take meds.  Unable to reorient patient to reality even with daughter assisting.

## 2012-07-13 NOTE — BHH Counselor (Addendum)
Faxed referral to [facility] Per Bonita Quin RN, they'll have one d/c later today. She requested chest xray and EKG. Per pt's RN Amie, pt has had both tests, however results haven't appeared in EPIC yet.  0730 faxed EKG & chest xray to [facility].  Pt declined at [facility] by Les Pou MD per Baxter Hire b/c pt acuity d/t not eating for 3 days and confusion.   Evette Cristal, [place] Assessment Counselor

## 2012-07-13 NOTE — BH Assessment (Signed)
Assessment Note   Debra Tran is a Caucasian, 79 y.o. female. Pt presents voluntarily with her daughter Debra Tran. Pt unable to stay awake for assessment, so all following info is collateral info provided by daughter. Pt was d/c from [facility] approx. 1 week ago. Three days ago, pt stopped eating and has been drinking very little. She has said , "I have the mark of the beast" and that there is no point in eating b/c "I'm dying anyway".  Pt told daughter that daughter also has the mark of the beast. Pt believes she has TB and is dying. She independently completed her ADLs until she arrived at [facility] and then told her daughter that she can't walk. Pt has hx of schizophrenia dating back to the 1970s. She has one suicide attempt by overdose in mid 1970s. Earlier tonight, pt told RN that pt didn't have any blood in her body to give for a blood sample. She lives in [facility]. Pt recently has had AH and VH. She says that she heard birds talking to each other and a man's voice and she stated she was walking on green lace when she was actually walking on a stone path in ALF courtyard. Debra Tran is [employer] and copy of POA is in pt's chart. Prior hospitalizations in 1970s and 1980s include [facility] in [place] and [place].   Axis I: Schizophrenia Axis II: Deferred Axis III:  Past Medical History  Diagnosis Date  . Phobia   . Macular degeneration   . Narcissism   . Schizophrenia, paranoid   . Fungal dermatitis   . Normocytic anemia   . Hypertension   . Hyperlipidemia   . Mild cognitive impairment   . Diabetes mellitus type 2 in obese   . Stress incontinence, female   . Depressive disorder   . Allergic rhinitis    Axis IV: other psychosocial or environmental problems, problems related to social environment and problems with primary support group Axis V: 21-30 behavior considerably influenced by delusions or hallucinations OR serious impairment in judgment, communication OR  inability to function in almost all areas  Past Medical History:  Past Medical History  Diagnosis Date  . Phobia   . Macular degeneration   . Narcissism   . Schizophrenia, paranoid   . Fungal dermatitis   . Normocytic anemia   . Hypertension   . Hyperlipidemia   . Mild cognitive impairment   . Diabetes mellitus type 2 in obese   . Stress incontinence, female   . Depressive disorder   . Allergic rhinitis     Past Surgical History  Procedure Laterality Date  . Cataract extraction  6 16 2006  . Cryotherapy  2 15 2006    facial AK  . Total abdominal hysterectomy w/ bilateral salpingoophorectomy      due to endometriosis  non malignant  . Hyperplastic   4 20 2006    AK shave biopsy  . Abdominal hysterectomy      due to fibroids    Family History:  Family History  Problem Relation Age of Onset  . Heart disease Mother   . Stroke Mother   . Heart disease Father   . Stroke Father   . Cancer Cousin     liver and colon    Social History:  reports that she has quit smoking. She does not have any smokeless tobacco history on file. She reports that she does not drink alcohol. Her drug history is not on  file.  Additional Social History:  Alcohol / Drug Use Pain Medications: see PTA meds list Prescriptions: see PTA meds list Over the Counter: see PTA meds list History of alcohol / drug use?: No history of alcohol / drug abuse Longest period of sobriety (when/how long): n/a  CIWA: CIWA-Ar BP: 110/61 mmHg Pulse Rate: 55 COWS:    Allergies: No Known Allergies  Home Medications:  (Not in a hospital admission)  OB/GYN Status:  No LMP recorded. Patient is postmenopausal.  General Assessment Data Location of Assessment: [facility] Living Arrangements: Other (Comment) [facility]) Can pt return to current living arrangement?: Yes Admission Status: Voluntary Is patient capable of signing voluntary admission?: No Transfer from: Nsg Home Referral Source:  Self/Family/Friend  Education Status Is patient currently in school?: No Current Grade: na Highest grade of school patient has completed: 12  Risk to self Suicidal Ideation: No Suicidal Intent: No Is patient at risk for suicide?: No Suicidal Plan?: No Access to Means: No What has been your use of drugs/alcohol within the last 12 months?: none Previous Attempts/Gestures: Yes How many times?: 1 (in 1970s) Other Self Harm Risks: none Triggers for Past Attempts: Unknown Intentional Self Injurious Behavior: None Family Suicide History: No Recent stressful life event(s):  (unable to assess) Persecutory voices/beliefs?: No Depression:  (unable to assess) Depression Symptoms:  (unable to assess) Substance abuse history and/or treatment for substance abuse?: No Suicide prevention information given to non-admitted patients: Not applicable  Risk to Others Homicidal Ideation:  (unable to assess) Thoughts of Harm to Others:  (unable to assess) Current Homicidal Intent:  (unable to assess) Current Homicidal Plan:  (unable to assess) Access to Homicidal Means: No Identified Victim: none History of harm to others?: No Assessment of Violence: None Noted Violent Behavior Description: unable to assess Does patient have access to weapons?: No Criminal Charges Pending?: No Does patient have a court date: No  Psychosis Hallucinations: Auditory;Visual Delusions: Somatic;Persecutory  Mental Status Report Appear/Hygiene: Other (Comment) (unremarkable) Eye Contact: Other (Comment) (sleeping) Motor Activity: Unable to assess Speech: Unable to assess Level of Consciousness: Sleeping Mood: Other (Comment) (unable to assess) Affect: Unable to Assess Anxiety Level:  (unable to assess) Thought Processes:  (unable to assess) Judgement:  (unable to assess) Orientation: Unable to assess Obsessive Compulsive Thoughts/Behaviors:  (unable to assess)  Cognitive Functioning Concentration:   (unable to assess) Memory:  (unable to assess) IQ: Average Insight:  (unable to assess) Impulse Control:  (unable to assess) Appetite: Poor Weight Loss:  (unknown) Weight Gain: 0 Sleep:  (unable to assess)  ADLScreening [facility]) Patient's cognitive ability adequate to safely complete daily activities?: Yes Patient able to express need for assistance with ADLs?: Yes Independently performs ADLs?: Yes (appropriate for developmental age)  Abuse/Neglect [facility]) Physical Abuse: Denies Verbal Abuse: Denies Sexual Abuse: Denies  Prior Inpatient Therapy Prior Inpatient Therapy: Yes Prior Therapy Dates: d/c from [facility] 1 week ago Prior Therapy Facilty/Provider(s): in 1970s & 1980s - [facility], [facility] Reason for Treatment: psychosis, schizophrenia  Prior Outpatient Therapy Prior Outpatient Therapy: No Prior Therapy Dates: na Prior Therapy Facilty/Provider(s): na Reason for Treatment: na  ADL Screening (condition at time of admission) Patient's cognitive ability adequate to safely complete daily activities?: Yes Patient able to express need for assistance with ADLs?: Yes Independently performs ADLs?: Yes (appropriate for developmental age) Weakness of Legs: None Weakness of Arms/Hands: None       Abuse/Neglect Assessment (Assessment to be complete while patient is alone) Physical Abuse: Denies Verbal  Abuse: Denies Sexual Abuse: Denies Exploitation of patient/patient's resources: Denies Self-Neglect: Denies     Merchant navy officer (For Healthcare) Advance Directive: Patient has advance directive, copy in chart Type of Advance Directive: Healthcare Power of Gerrit Friends (daughter Cherissa [facility] [employer] POA)    Additional Information 1:1 In Past 12 Months?: No CIRT Risk: No Elopement Risk: No Does patient have medical clearance?: Yes     Disposition:  Disposition Initial Assessment Completed for this Encounter: Yes Disposition of Patient:  Inpatient treatment program Type of inpatient treatment program: Adult (geropsych facility)  On Site Evaluation by:   Reviewed with Physician:     Donnamarie Rossetti P 07/13/2012 4:15 AM

## 2012-07-13 NOTE — ED Notes (Signed)
Emergency Contact: Tomi Bamberger [phone] (C). Password: "Diamonds"

## 2012-07-13 NOTE — ED Notes (Signed)
Dr. [name] at bedside and verifies escoriated area on buttocks is not new.  It is a condition being treated by dermatology prior to admit.

## 2012-07-14 DIAGNOSIS — H3530 Unspecified macular degeneration: Secondary | ICD-10-CM | POA: Diagnosis not present

## 2012-07-14 DIAGNOSIS — F332 Major depressive disorder, recurrent severe without psychotic features: Secondary | ICD-10-CM | POA: Diagnosis not present

## 2012-07-14 DIAGNOSIS — J309 Allergic rhinitis, unspecified: Secondary | ICD-10-CM | POA: Diagnosis present

## 2012-07-14 DIAGNOSIS — M810 Age-related osteoporosis without current pathological fracture: Secondary | ICD-10-CM | POA: Diagnosis present

## 2012-07-14 DIAGNOSIS — M150 Primary generalized (osteo)arthritis: Secondary | ICD-10-CM | POA: Diagnosis not present

## 2012-07-14 DIAGNOSIS — Z5189 Encounter for other specified aftercare: Secondary | ICD-10-CM | POA: Diagnosis not present

## 2012-07-14 DIAGNOSIS — L89309 Pressure ulcer of unspecified buttock, unspecified stage: Secondary | ICD-10-CM | POA: Diagnosis not present

## 2012-07-14 DIAGNOSIS — L8993 Pressure ulcer of unspecified site, stage 3: Secondary | ICD-10-CM | POA: Diagnosis not present

## 2012-07-14 DIAGNOSIS — B369 Superficial mycosis, unspecified: Secondary | ICD-10-CM | POA: Diagnosis not present

## 2012-07-14 DIAGNOSIS — F919 Conduct disorder, unspecified: Secondary | ICD-10-CM | POA: Diagnosis not present

## 2012-07-14 DIAGNOSIS — F0280 Dementia in other diseases classified elsewhere without behavioral disturbance: Secondary | ICD-10-CM | POA: Diagnosis not present

## 2012-07-14 DIAGNOSIS — D649 Anemia, unspecified: Secondary | ICD-10-CM | POA: Diagnosis present

## 2012-07-14 DIAGNOSIS — E785 Hyperlipidemia, unspecified: Secondary | ICD-10-CM | POA: Diagnosis present

## 2012-07-14 DIAGNOSIS — M159 Polyosteoarthritis, unspecified: Secondary | ICD-10-CM | POA: Diagnosis not present

## 2012-07-14 DIAGNOSIS — R32 Unspecified urinary incontinence: Secondary | ICD-10-CM | POA: Diagnosis present

## 2012-07-14 DIAGNOSIS — G3184 Mild cognitive impairment, so stated: Secondary | ICD-10-CM | POA: Diagnosis present

## 2012-07-14 DIAGNOSIS — F329 Major depressive disorder, single episode, unspecified: Secondary | ICD-10-CM | POA: Diagnosis not present

## 2012-07-14 DIAGNOSIS — I10 Essential (primary) hypertension: Secondary | ICD-10-CM | POA: Diagnosis not present

## 2012-07-14 DIAGNOSIS — F0390 Unspecified dementia without behavioral disturbance: Secondary | ICD-10-CM | POA: Diagnosis not present

## 2012-07-14 DIAGNOSIS — M1991 Primary osteoarthritis, unspecified site: Secondary | ICD-10-CM | POA: Diagnosis not present

## 2012-07-14 DIAGNOSIS — R627 Adult failure to thrive: Secondary | ICD-10-CM | POA: Diagnosis not present

## 2012-07-14 DIAGNOSIS — K5900 Constipation, unspecified: Secondary | ICD-10-CM | POA: Diagnosis present

## 2012-07-14 DIAGNOSIS — N179 Acute kidney failure, unspecified: Secondary | ICD-10-CM | POA: Diagnosis not present

## 2012-07-14 DIAGNOSIS — F200 Paranoid schizophrenia: Secondary | ICD-10-CM | POA: Diagnosis not present

## 2012-07-14 DIAGNOSIS — F29 Unspecified psychosis not due to a substance or known physiological condition: Secondary | ICD-10-CM | POA: Diagnosis not present

## 2012-07-14 DIAGNOSIS — E118 Type 2 diabetes mellitus with unspecified complications: Secondary | ICD-10-CM | POA: Diagnosis not present

## 2012-07-14 DIAGNOSIS — K219 Gastro-esophageal reflux disease without esophagitis: Secondary | ICD-10-CM | POA: Diagnosis not present

## 2012-07-14 DIAGNOSIS — F259 Schizoaffective disorder, unspecified: Secondary | ICD-10-CM | POA: Diagnosis not present

## 2012-07-14 DIAGNOSIS — E876 Hypokalemia: Secondary | ICD-10-CM | POA: Diagnosis not present

## 2012-07-14 DIAGNOSIS — E119 Type 2 diabetes mellitus without complications: Secondary | ICD-10-CM | POA: Diagnosis not present

## 2012-07-14 DIAGNOSIS — F0391 Unspecified dementia with behavioral disturbance: Secondary | ICD-10-CM | POA: Diagnosis not present

## 2012-07-14 LAB — GLUCOSE, CAPILLARY: Glucose-Capillary: 75 mg/dL (ref 70–99)

## 2012-07-14 MED ORDER — KCL IN DEXTROSE-NACL 20-5-0.45 MEQ/L-%-% IV SOLN
INTRAVENOUS | Status: DC
  Administered 2012-07-14: 08:00:00 via INTRAVENOUS
  Filled 2012-07-14: qty 1000

## 2012-07-14 NOTE — Telephone Encounter (Signed)
The urine culture read multiple morphotypes 15 k colonies. If she does not have frequency, fever, abdominal pain, dysuria or other urinary symptoms, will not re-order the urine sample.

## 2012-07-14 NOTE — ED Notes (Signed)
Paperwork signed and faxed to [facility].  Pt accepted.  Beds available today.  Daughter at bedside and made aware.

## 2012-07-14 NOTE — ED Notes (Signed)
MD aware of CBG.  IVF ordered.  Pt voided large amount.

## 2012-07-14 NOTE — ED Provider Notes (Signed)
Pt patient is here waiting placement for [facility].  The patient remains alert but has borderline low blood sugars. IV dextrose has been ordered. She has an order for metformin but has not been taking that while she has been here in the emergency department. I have canceled that medication.  Filed Vitals:   07/14/12 0730  BP:   Pulse: 59  Temp: 97.9 F (36.6 C)  Resp: 13     Celene Kras, MD 07/14/12 [phone]

## 2012-07-14 NOTE — Telephone Encounter (Signed)
LVM for patient's daughter to call back.  ?

## 2012-07-14 NOTE — ED Notes (Signed)
Called report to Roxanne at [facility] accepted report but states they do not discharge until 13:00pm and we can call transport around 13:00pm to send pt over.

## 2012-07-14 NOTE — ED Notes (Signed)
Report to Debra Tran pt to [facility]

## 2012-07-15 NOTE — Telephone Encounter (Signed)
Left message on voicemail to rtn call 07/15/2012. Debra Tran, [name]

## 2012-07-16 NOTE — Telephone Encounter (Signed)
Daughter is calling to say that her mom is in [facility] in [place] - is having a hard time because she has not eaten for 5 days and can't get any answers as to what they are going to do.  Is just frustrated with the system and no one seems to want to help her mom because her mom is schizo and not wanting to eat or drink.

## 2012-07-16 NOTE — Telephone Encounter (Signed)
It appears pt is in ED awaiting psych inpatient unit.

## 2012-07-17 ENCOUNTER — Ambulatory Visit: Payer: Medicare Other

## 2012-08-04 DIAGNOSIS — E669 Obesity, unspecified: Secondary | ICD-10-CM | POA: Diagnosis not present

## 2012-08-04 DIAGNOSIS — M79676 Pain in unspecified toe(s): Secondary | ICD-10-CM | POA: Diagnosis not present

## 2012-08-04 DIAGNOSIS — R1310 Dysphagia, unspecified: Secondary | ICD-10-CM | POA: Diagnosis not present

## 2012-08-04 DIAGNOSIS — H35039 Hypertensive retinopathy, unspecified eye: Secondary | ICD-10-CM | POA: Diagnosis not present

## 2012-08-04 DIAGNOSIS — E118 Type 2 diabetes mellitus with unspecified complications: Secondary | ICD-10-CM | POA: Diagnosis not present

## 2012-08-04 DIAGNOSIS — M6281 Muscle weakness (generalized): Secondary | ICD-10-CM | POA: Diagnosis not present

## 2012-08-04 DIAGNOSIS — M19079 Primary osteoarthritis, unspecified ankle and foot: Secondary | ICD-10-CM | POA: Diagnosis not present

## 2012-08-04 DIAGNOSIS — R350 Frequency of micturition: Secondary | ICD-10-CM | POA: Diagnosis not present

## 2012-08-04 DIAGNOSIS — R0989 Other specified symptoms and signs involving the circulatory and respiratory systems: Secondary | ICD-10-CM | POA: Diagnosis not present

## 2012-08-04 DIAGNOSIS — L98411 Non-pressure chronic ulcer of buttock limited to breakdown of skin: Secondary | ICD-10-CM | POA: Diagnosis not present

## 2012-08-04 DIAGNOSIS — J984 Other disorders of lung: Secondary | ICD-10-CM | POA: Diagnosis not present

## 2012-08-04 DIAGNOSIS — N390 Urinary tract infection, site not specified: Secondary | ICD-10-CM | POA: Diagnosis not present

## 2012-08-04 DIAGNOSIS — Z7982 Long term (current) use of aspirin: Secondary | ICD-10-CM | POA: Diagnosis not present

## 2012-08-04 DIAGNOSIS — M154 Erosive (osteo)arthritis: Secondary | ICD-10-CM | POA: Diagnosis not present

## 2012-08-04 DIAGNOSIS — L98499 Non-pressure chronic ulcer of skin of other sites with unspecified severity: Secondary | ICD-10-CM | POA: Diagnosis not present

## 2012-08-04 DIAGNOSIS — D485 Neoplasm of uncertain behavior of skin: Secondary | ICD-10-CM | POA: Diagnosis not present

## 2012-08-04 DIAGNOSIS — H3532 Exudative age-related macular degeneration: Secondary | ICD-10-CM | POA: Diagnosis not present

## 2012-08-04 DIAGNOSIS — Z5189 Encounter for other specified aftercare: Secondary | ICD-10-CM | POA: Diagnosis not present

## 2012-08-04 DIAGNOSIS — M109 Gout, unspecified: Secondary | ICD-10-CM | POA: Diagnosis not present

## 2012-08-04 DIAGNOSIS — Q828 Other specified congenital malformations of skin: Secondary | ICD-10-CM | POA: Diagnosis not present

## 2012-08-04 DIAGNOSIS — E559 Vitamin D deficiency, unspecified: Secondary | ICD-10-CM | POA: Diagnosis not present

## 2012-08-04 DIAGNOSIS — H3531 Nonexudative age-related macular degeneration: Secondary | ICD-10-CM | POA: Diagnosis not present

## 2012-08-04 DIAGNOSIS — F3289 Other specified depressive episodes: Secondary | ICD-10-CM | POA: Diagnosis not present

## 2012-08-04 DIAGNOSIS — Z79899 Other long term (current) drug therapy: Secondary | ICD-10-CM | POA: Diagnosis not present

## 2012-08-04 DIAGNOSIS — D649 Anemia, unspecified: Secondary | ICD-10-CM | POA: Diagnosis not present

## 2012-08-04 DIAGNOSIS — B351 Tinea unguium: Secondary | ICD-10-CM | POA: Diagnosis not present

## 2012-08-04 DIAGNOSIS — M533 Sacrococcygeal disorders, not elsewhere classified: Secondary | ICD-10-CM | POA: Diagnosis not present

## 2012-08-04 DIAGNOSIS — F209 Schizophrenia, unspecified: Secondary | ICD-10-CM | POA: Diagnosis not present

## 2012-08-04 DIAGNOSIS — L570 Actinic keratosis: Secondary | ICD-10-CM | POA: Diagnosis not present

## 2012-08-04 DIAGNOSIS — G2401 Drug induced subacute dyskinesia: Secondary | ICD-10-CM | POA: Diagnosis not present

## 2012-08-04 DIAGNOSIS — F329 Major depressive disorder, single episode, unspecified: Secondary | ICD-10-CM | POA: Diagnosis not present

## 2012-08-04 DIAGNOSIS — L03115 Cellulitis of right lower limb: Secondary | ICD-10-CM | POA: Diagnosis not present

## 2012-08-04 DIAGNOSIS — M150 Primary generalized (osteo)arthritis: Secondary | ICD-10-CM | POA: Diagnosis not present

## 2012-08-04 DIAGNOSIS — H18519 Endothelial corneal dystrophy, unspecified eye: Secondary | ICD-10-CM | POA: Diagnosis not present

## 2012-08-04 DIAGNOSIS — IMO0001 Reserved for inherently not codable concepts without codable children: Secondary | ICD-10-CM | POA: Diagnosis not present

## 2012-08-04 DIAGNOSIS — H35033 Hypertensive retinopathy, bilateral: Secondary | ICD-10-CM | POA: Diagnosis not present

## 2012-08-04 DIAGNOSIS — E119 Type 2 diabetes mellitus without complications: Secondary | ICD-10-CM | POA: Diagnosis not present

## 2012-08-04 DIAGNOSIS — M1710 Unilateral primary osteoarthritis, unspecified knee: Secondary | ICD-10-CM | POA: Diagnosis not present

## 2012-08-04 DIAGNOSIS — R6889 Other general symptoms and signs: Secondary | ICD-10-CM | POA: Diagnosis not present

## 2012-08-04 DIAGNOSIS — Z794 Long term (current) use of insulin: Secondary | ICD-10-CM | POA: Diagnosis not present

## 2012-08-04 DIAGNOSIS — J309 Allergic rhinitis, unspecified: Secondary | ICD-10-CM | POA: Diagnosis not present

## 2012-08-04 DIAGNOSIS — H26499 Other secondary cataract, unspecified eye: Secondary | ICD-10-CM | POA: Diagnosis not present

## 2012-08-04 DIAGNOSIS — M1991 Primary osteoarthritis, unspecified site: Secondary | ICD-10-CM | POA: Diagnosis not present

## 2012-08-04 DIAGNOSIS — I2510 Atherosclerotic heart disease of native coronary artery without angina pectoris: Secondary | ICD-10-CM | POA: Diagnosis not present

## 2012-08-04 DIAGNOSIS — M79671 Pain in right foot: Secondary | ICD-10-CM | POA: Diagnosis not present

## 2012-08-04 DIAGNOSIS — D049 Carcinoma in situ of skin, unspecified: Secondary | ICD-10-CM | POA: Diagnosis not present

## 2012-08-04 DIAGNOSIS — L8992 Pressure ulcer of unspecified site, stage 2: Secondary | ICD-10-CM | POA: Diagnosis not present

## 2012-08-04 DIAGNOSIS — F0280 Dementia in other diseases classified elsewhere without behavioral disturbance: Secondary | ICD-10-CM | POA: Diagnosis not present

## 2012-08-04 DIAGNOSIS — M159 Polyosteoarthritis, unspecified: Secondary | ICD-10-CM | POA: Diagnosis not present

## 2012-08-04 DIAGNOSIS — M2040 Other hammer toe(s) (acquired), unspecified foot: Secondary | ICD-10-CM | POA: Diagnosis not present

## 2012-08-04 DIAGNOSIS — Z8742 Personal history of other diseases of the female genital tract: Secondary | ICD-10-CM | POA: Diagnosis not present

## 2012-08-04 DIAGNOSIS — R05 Cough: Secondary | ICD-10-CM | POA: Diagnosis not present

## 2012-08-04 DIAGNOSIS — M129 Arthropathy, unspecified: Secondary | ICD-10-CM | POA: Diagnosis not present

## 2012-08-04 DIAGNOSIS — H3530 Unspecified macular degeneration: Secondary | ICD-10-CM | POA: Diagnosis not present

## 2012-08-04 DIAGNOSIS — J811 Chronic pulmonary edema: Secondary | ICD-10-CM | POA: Diagnosis not present

## 2012-08-04 DIAGNOSIS — Z87891 Personal history of nicotine dependence: Secondary | ICD-10-CM | POA: Diagnosis not present

## 2012-08-04 DIAGNOSIS — Z862 Personal history of diseases of the blood and blood-forming organs and certain disorders involving the immune mechanism: Secondary | ICD-10-CM | POA: Diagnosis not present

## 2012-08-04 DIAGNOSIS — E1169 Type 2 diabetes mellitus with other specified complication: Secondary | ICD-10-CM | POA: Diagnosis not present

## 2012-08-04 DIAGNOSIS — IMO0002 Reserved for concepts with insufficient information to code with codable children: Secondary | ICD-10-CM | POA: Diagnosis not present

## 2012-08-04 DIAGNOSIS — L89309 Pressure ulcer of unspecified buttock, unspecified stage: Secondary | ICD-10-CM | POA: Diagnosis not present

## 2012-08-04 DIAGNOSIS — E1159 Type 2 diabetes mellitus with other circulatory complications: Secondary | ICD-10-CM | POA: Diagnosis not present

## 2012-08-04 DIAGNOSIS — B958 Unspecified staphylococcus as the cause of diseases classified elsewhere: Secondary | ICD-10-CM | POA: Diagnosis not present

## 2012-08-04 DIAGNOSIS — L8991 Pressure ulcer of unspecified site, stage 1: Secondary | ICD-10-CM | POA: Diagnosis not present

## 2012-08-04 DIAGNOSIS — Z780 Asymptomatic menopausal state: Secondary | ICD-10-CM | POA: Diagnosis not present

## 2012-08-04 DIAGNOSIS — H01009 Unspecified blepharitis unspecified eye, unspecified eyelid: Secondary | ICD-10-CM | POA: Diagnosis not present

## 2012-08-04 DIAGNOSIS — J111 Influenza due to unidentified influenza virus with other respiratory manifestations: Secondary | ICD-10-CM | POA: Diagnosis not present

## 2012-08-04 DIAGNOSIS — C4492 Squamous cell carcinoma of skin, unspecified: Secondary | ICD-10-CM | POA: Diagnosis not present

## 2012-08-04 DIAGNOSIS — F0390 Unspecified dementia without behavioral disturbance: Secondary | ICD-10-CM | POA: Diagnosis not present

## 2012-08-04 DIAGNOSIS — G3184 Mild cognitive impairment, so stated: Secondary | ICD-10-CM | POA: Diagnosis not present

## 2012-08-04 DIAGNOSIS — E1142 Type 2 diabetes mellitus with diabetic polyneuropathy: Secondary | ICD-10-CM | POA: Diagnosis not present

## 2012-08-04 DIAGNOSIS — M8580 Other specified disorders of bone density and structure, unspecified site: Secondary | ICD-10-CM | POA: Diagnosis not present

## 2012-08-04 DIAGNOSIS — M25579 Pain in unspecified ankle and joints of unspecified foot: Secondary | ICD-10-CM | POA: Diagnosis not present

## 2012-08-04 DIAGNOSIS — R531 Weakness: Secondary | ICD-10-CM | POA: Diagnosis present

## 2012-08-04 DIAGNOSIS — T148 Other injury of unspecified body region: Secondary | ICD-10-CM | POA: Diagnosis not present

## 2012-08-04 DIAGNOSIS — R293 Abnormal posture: Secondary | ICD-10-CM | POA: Diagnosis not present

## 2012-08-04 DIAGNOSIS — F259 Schizoaffective disorder, unspecified: Secondary | ICD-10-CM | POA: Diagnosis not present

## 2012-08-04 DIAGNOSIS — H43813 Vitreous degeneration, bilateral: Secondary | ICD-10-CM | POA: Diagnosis not present

## 2012-08-04 DIAGNOSIS — I10 Essential (primary) hypertension: Secondary | ICD-10-CM | POA: Diagnosis not present

## 2012-08-04 DIAGNOSIS — Z029 Encounter for administrative examinations, unspecified: Secondary | ICD-10-CM | POA: Diagnosis not present

## 2012-08-04 DIAGNOSIS — M2041 Other hammer toe(s) (acquired), right foot: Secondary | ICD-10-CM | POA: Diagnosis not present

## 2012-08-04 DIAGNOSIS — E785 Hyperlipidemia, unspecified: Secondary | ICD-10-CM | POA: Diagnosis not present

## 2012-08-04 DIAGNOSIS — L988 Other specified disorders of the skin and subcutaneous tissue: Secondary | ICD-10-CM | POA: Diagnosis not present

## 2012-08-04 DIAGNOSIS — F29 Unspecified psychosis not due to a substance or known physiological condition: Secondary | ICD-10-CM | POA: Diagnosis not present

## 2012-08-04 DIAGNOSIS — R030 Elevated blood-pressure reading, without diagnosis of hypertension: Secondary | ICD-10-CM | POA: Diagnosis not present

## 2012-08-04 DIAGNOSIS — D518 Other vitamin B12 deficiency anemias: Secondary | ICD-10-CM | POA: Diagnosis not present

## 2012-08-04 DIAGNOSIS — L608 Other nail disorders: Secondary | ICD-10-CM | POA: Diagnosis not present

## 2012-08-04 DIAGNOSIS — M79674 Pain in right toe(s): Secondary | ICD-10-CM | POA: Diagnosis not present

## 2012-08-04 DIAGNOSIS — K219 Gastro-esophageal reflux disease without esophagitis: Secondary | ICD-10-CM | POA: Diagnosis not present

## 2012-08-04 DIAGNOSIS — H43819 Vitreous degeneration, unspecified eye: Secondary | ICD-10-CM | POA: Diagnosis not present

## 2012-08-04 DIAGNOSIS — Z803 Family history of malignant neoplasm of breast: Secondary | ICD-10-CM | POA: Diagnosis not present

## 2012-08-04 DIAGNOSIS — H269 Unspecified cataract: Secondary | ICD-10-CM | POA: Diagnosis not present

## 2012-08-04 DIAGNOSIS — M79675 Pain in left toe(s): Secondary | ICD-10-CM | POA: Diagnosis not present

## 2012-08-04 DIAGNOSIS — H35059 Retinal neovascularization, unspecified, unspecified eye: Secondary | ICD-10-CM | POA: Diagnosis not present

## 2012-08-04 DIAGNOSIS — J301 Allergic rhinitis due to pollen: Secondary | ICD-10-CM | POA: Diagnosis not present

## 2012-08-04 DIAGNOSIS — R0602 Shortness of breath: Secondary | ICD-10-CM | POA: Diagnosis not present

## 2012-08-04 DIAGNOSIS — R51 Headache: Secondary | ICD-10-CM | POA: Diagnosis not present

## 2012-08-04 DIAGNOSIS — L989 Disorder of the skin and subcutaneous tissue, unspecified: Secondary | ICD-10-CM | POA: Diagnosis not present

## 2012-08-04 DIAGNOSIS — F200 Paranoid schizophrenia: Secondary | ICD-10-CM | POA: Diagnosis not present

## 2012-08-04 DIAGNOSIS — G250 Essential tremor: Secondary | ICD-10-CM | POA: Diagnosis not present

## 2012-08-04 DIAGNOSIS — R21 Rash and other nonspecific skin eruption: Secondary | ICD-10-CM | POA: Diagnosis not present

## 2012-08-04 DIAGNOSIS — L603 Nail dystrophy: Secondary | ICD-10-CM | POA: Diagnosis not present

## 2012-08-04 DIAGNOSIS — Z8619 Personal history of other infectious and parasitic diseases: Secondary | ICD-10-CM | POA: Diagnosis not present

## 2012-08-04 DIAGNOSIS — H02829 Cysts of unspecified eye, unspecified eyelid: Secondary | ICD-10-CM | POA: Diagnosis not present

## 2012-08-04 DIAGNOSIS — D0460 Carcinoma in situ of skin of unspecified upper limb, including shoulder: Secondary | ICD-10-CM | POA: Diagnosis not present

## 2012-08-04 DIAGNOSIS — E1165 Type 2 diabetes mellitus with hyperglycemia: Secondary | ICD-10-CM | POA: Diagnosis not present

## 2012-08-04 DIAGNOSIS — E039 Hypothyroidism, unspecified: Secondary | ICD-10-CM | POA: Diagnosis not present

## 2012-08-04 DIAGNOSIS — F03918 Unspecified dementia, unspecified severity, with other behavioral disturbance: Secondary | ICD-10-CM | POA: Diagnosis not present

## 2012-08-04 DIAGNOSIS — E1140 Type 2 diabetes mellitus with diabetic neuropathy, unspecified: Secondary | ICD-10-CM | POA: Diagnosis not present

## 2012-08-04 DIAGNOSIS — K5900 Constipation, unspecified: Secondary | ICD-10-CM | POA: Diagnosis not present

## 2012-08-04 DIAGNOSIS — J1189 Influenza due to unidentified influenza virus with other manifestations: Secondary | ICD-10-CM | POA: Diagnosis not present

## 2012-08-04 DIAGNOSIS — D520 Dietary folate deficiency anemia: Secondary | ICD-10-CM | POA: Diagnosis not present

## 2012-08-04 DIAGNOSIS — M2042 Other hammer toe(s) (acquired), left foot: Secondary | ICD-10-CM | POA: Diagnosis not present

## 2012-08-04 DIAGNOSIS — R222 Localized swelling, mass and lump, trunk: Secondary | ICD-10-CM | POA: Diagnosis not present

## 2012-08-04 DIAGNOSIS — E1149 Type 2 diabetes mellitus with other diabetic neurological complication: Secondary | ICD-10-CM | POA: Diagnosis not present

## 2012-08-04 DIAGNOSIS — M25569 Pain in unspecified knee: Secondary | ICD-10-CM | POA: Diagnosis not present

## 2012-08-04 DIAGNOSIS — Z23 Encounter for immunization: Secondary | ICD-10-CM | POA: Diagnosis not present

## 2012-08-04 DIAGNOSIS — F0391 Unspecified dementia with behavioral disturbance: Secondary | ICD-10-CM | POA: Diagnosis not present

## 2012-08-04 DIAGNOSIS — Z7951 Long term (current) use of inhaled steroids: Secondary | ICD-10-CM | POA: Diagnosis not present

## 2012-08-04 DIAGNOSIS — H3509 Other intraretinal microvascular abnormalities: Secondary | ICD-10-CM | POA: Diagnosis not present

## 2012-08-04 DIAGNOSIS — R262 Difficulty in walking, not elsewhere classified: Secondary | ICD-10-CM | POA: Diagnosis not present

## 2012-08-04 DIAGNOSIS — N393 Stress incontinence (female) (male): Secondary | ICD-10-CM | POA: Diagnosis not present

## 2012-08-04 DIAGNOSIS — Z1231 Encounter for screening mammogram for malignant neoplasm of breast: Secondary | ICD-10-CM | POA: Diagnosis not present

## 2012-08-04 DIAGNOSIS — Z8262 Family history of osteoporosis: Secondary | ICD-10-CM | POA: Diagnosis not present

## 2012-08-07 DIAGNOSIS — F29 Unspecified psychosis not due to a substance or known physiological condition: Secondary | ICD-10-CM | POA: Diagnosis not present

## 2012-08-07 DIAGNOSIS — F0391 Unspecified dementia with behavioral disturbance: Secondary | ICD-10-CM | POA: Diagnosis not present

## 2012-08-07 DIAGNOSIS — F329 Major depressive disorder, single episode, unspecified: Secondary | ICD-10-CM | POA: Diagnosis not present

## 2012-08-08 ENCOUNTER — Encounter: Payer: Self-pay | Admitting: Internal Medicine

## 2012-08-08 ENCOUNTER — Non-Acute Institutional Stay (SKILLED_NURSING_FACILITY): Payer: Medicare Other | Admitting: Internal Medicine

## 2012-08-08 DIAGNOSIS — E119 Type 2 diabetes mellitus without complications: Secondary | ICD-10-CM | POA: Diagnosis not present

## 2012-08-08 DIAGNOSIS — F209 Schizophrenia, unspecified: Secondary | ICD-10-CM

## 2012-08-08 DIAGNOSIS — R269 Unspecified abnormalities of gait and mobility: Secondary | ICD-10-CM

## 2012-08-08 DIAGNOSIS — F3289 Other specified depressive episodes: Secondary | ICD-10-CM | POA: Diagnosis not present

## 2012-08-08 DIAGNOSIS — E785 Hyperlipidemia, unspecified: Secondary | ICD-10-CM | POA: Diagnosis not present

## 2012-08-08 DIAGNOSIS — F329 Major depressive disorder, single episode, unspecified: Secondary | ICD-10-CM

## 2012-08-08 DIAGNOSIS — F0390 Unspecified dementia without behavioral disturbance: Secondary | ICD-10-CM

## 2012-08-08 DIAGNOSIS — F03A0 Unspecified dementia, mild, without behavioral disturbance, psychotic disturbance, mood disturbance, and anxiety: Secondary | ICD-10-CM

## 2012-08-08 DIAGNOSIS — I10 Essential (primary) hypertension: Secondary | ICD-10-CM

## 2012-08-08 NOTE — Progress Notes (Signed)
Date: 09/28/2012  MRN:  [id number] Name:  Debra Tran Sex:  female Age:  79 y.o. DOB:09-Apr-1928   PSC #:                       Facility/Room:  [facility] Level Of Care:  SNF Provider: Kermit Balo, DO, CMD  Emergency Contacts: Contact Information   Name Relation Home Work Mobile   Tran,Debra Daughter [phone]     [place] Gum [phone]       Code Status:full code  Allergies:No Known Allergies   Chief Complaint  Patient presents with  . Hospitalization Follow-up    new admission s/p hospitalization   HPI:  79 yo female with h/o schizophrenia since the 1970s who was admitted here for STR and possibly long term care after a  Hospitalization at [facility]  Ms. [name] had been living at [facility] when she developed a delusion that she had TB and was going to die. She refused to eat or drink and was brought to the [facility] ED where telehealth psychiatry diagnosed her with a major depressive episode and referred her to [place].  She is gradually improving.    Past Medical History  Diagnosis Date  . Phobia   . Macular degeneration   . Narcissism   . Schizophrenia, paranoid   . Fungal dermatitis   . Normocytic anemia   . Hypertension   . Hyperlipidemia   . Mild cognitive impairment   . Diabetes mellitus type 2 in obese   . Stress incontinence, female   . Depressive disorder   . Allergic rhinitis     Past Surgical History  Procedure Laterality Date  . Cataract extraction  6 16 2006  . Cryotherapy  2 15 2006    facial AK  . Total abdominal hysterectomy w/ bilateral salpingoophorectomy      due to endometriosis  non malignant  . Hyperplastic   4 20 2006    AK shave biopsy  . Abdominal hysterectomy      due to fibroids    Consultants:  psychiatry  Current Outpatient Prescriptions  Medication Sig Dispense Refill  . aspirin EC 81 MG tablet Take 81 mg by mouth every morning.       Marland Kitchen  atorvastatin (LIPITOR) 20 MG tablet Take 1 tablet (20 mg total) by mouth at bedtime.  30 tablet  11  . Calcium Carbonate-Vitamin D (CALCIUM 600-D) 600-400 MG-UNIT per tablet Take 1 tablet by mouth every morning.       . donepezil (ARICEPT) 5 MG tablet Take 5 mg by mouth at bedtime.      Marland Kitchen escitalopram (LEXAPRO) 10 MG tablet Take 10 mg by mouth at bedtime.      . fluticasone (FLONASE) 50 MCG/ACT nasal spray Place 2 sprays into the nose every morning.       Marland Kitchen ketoconazole (NIZORAL) 2 % cream Apply 1 application topically 2 (two) times daily. Apply to groin and buttocks 2 times a day      . metFORMIN (GLUCOPHAGE) 500 MG tablet Take 1 tablet (500 mg total) by mouth 2 (two) times daily with a meal.  180 tablet  1  . Multiple Vitamin (MULTIVITAMIN WITH MINERALS) TABS Take 1 tablet by mouth every morning.      Marland Kitchen OLANZapine (ZYPREXA) 5 MG tablet Take 1 tablet (5 mg total) by mouth at bedtime.      Marland Kitchen omeprazole (PRILOSEC) 20 MG capsule Take 1 capsule (20  mg total) by mouth 2 (two) times daily.  60 capsule  5  . valsartan-hydrochlorothiazide (DIOVAN-HCT) 320-25 MG per tablet Take 1 tablet by mouth every evening.      . ziprasidone (GEODON) 20 MG capsule Take 20 mg by mouth 2 (two) times daily as needed. Take 1 tablet at bedtime. May take 1 additional tablet daily as needed for hallucinations       No current facility-administered medications for this visit.    Immunization History  Administered Date(s) Administered  . Influenza Split 02/15/2011, 01/21/2012  . Influenza Whole 02/05/2007, 01/17/2010  . Pneumococcal Polysaccharide 04/10/1999  . Td 10/07/2001     Diet:  History  Substance Use Topics  . Smoking status: Former Games developer  . Smokeless tobacco: Not on file  . Alcohol Use: No    Family History  Problem Relation Age of Onset  . Heart disease Mother   . Stroke Mother   . Heart disease Father   . Stroke Father   . Cancer Cousin     liver and colon    Review of Systems   Constitutional: Positive for malaise/fatigue. Negative for fever and chills.  HENT: Negative for hearing loss.   Eyes: Negative for blurred vision.  Respiratory: Negative for shortness of breath.   Cardiovascular: Negative for chest pain.  Gastrointestinal: Negative for abdominal pain.  Genitourinary:       Urinary incontinence  Musculoskeletal: Positive for joint pain and falls.  Skin: Negative for rash.  Neurological: Positive for weakness. Negative for dizziness.  Psychiatric/Behavioral: Positive for depression and memory loss. Negative for suicidal ideas.    Vital signs: BP 139/69  Pulse 80  Temp(Src) 97.7 F (36.5 C)  Resp 17  Ht 5' (1.524 m)  Wt 152 lb (68.947 kg)  BMI 29.69 kg/m2  SpO2 99%  Physical Exam  Constitutional:  Obese female  HENT:  Head: Normocephalic and atraumatic.  Right Ear: External ear normal.  Left Ear: External ear normal.  Nose: Nose normal.  Mouth/Throat: Oropharynx is clear and moist. No oropharyngeal exudate.  Eyes: EOM are normal. Pupils are equal, round, and reactive to light. No scleral icterus.  Neck: Normal range of motion. Neck supple. No JVD present. No tracheal deviation present. No thyromegaly present.  Cardiovascular: Normal rate, regular rhythm, normal heart sounds and intact distal pulses.   Pulmonary/Chest: Effort normal and breath sounds normal.  Abdominal: Soft. Bowel sounds are normal. She exhibits no distension and no mass. There is no tenderness.  Musculoskeletal: Normal range of motion. She exhibits no edema and no tenderness.  Neurological: She is alert. No cranial nerve deficit.  Oriented to place and person  Skin: Skin is warm and dry.  Psychiatric:  Flat affect    Plan: DIABETES MELLITUS II, UNCOMPLICATED Stable with asa 81mg , metformin 500mg  po bid with meals, ARB, statin.  F/u hba1c.  HYPERLIPIDEMIA On lipitor.  Last levels at goal in [facility].  Need more info from [place]--unclear if this and hba1c were  retested at that time.    SCHIZOPHRENIA Recently had depressive episode that required [facility] stay b/c she was not eating or drinking and had delusional thoughts that she was dying from TB.  Now on lexapro and geodon.    DEPRESSIVE DISORDER, NOS Continue lexapro with geodon for delusions.  Definitely needs NCEPS to follow her.  Mild dementia Continue to monitor.  Should have repeat MMSE.  Again, need more records from [place] about mentation testing.  Continue aricept.  Consider uptitrating and adding namenda.  HYPERTENSION, BENIGN SYSTEMIC Goal <150/90 on valsartan/hctz.    GAIT DISTURBANCE To receive pt, ot here.  Has h/o falling.

## 2012-08-18 DIAGNOSIS — F29 Unspecified psychosis not due to a substance or known physiological condition: Secondary | ICD-10-CM | POA: Diagnosis not present

## 2012-08-18 DIAGNOSIS — F329 Major depressive disorder, single episode, unspecified: Secondary | ICD-10-CM | POA: Diagnosis not present

## 2012-08-18 DIAGNOSIS — F0391 Unspecified dementia with behavioral disturbance: Secondary | ICD-10-CM | POA: Diagnosis not present

## 2012-08-19 DIAGNOSIS — F0391 Unspecified dementia with behavioral disturbance: Secondary | ICD-10-CM | POA: Diagnosis not present

## 2012-08-19 DIAGNOSIS — F329 Major depressive disorder, single episode, unspecified: Secondary | ICD-10-CM | POA: Diagnosis not present

## 2012-08-19 DIAGNOSIS — M25579 Pain in unspecified ankle and joints of unspecified foot: Secondary | ICD-10-CM | POA: Diagnosis not present

## 2012-08-19 DIAGNOSIS — F29 Unspecified psychosis not due to a substance or known physiological condition: Secondary | ICD-10-CM | POA: Diagnosis not present

## 2012-08-20 ENCOUNTER — Non-Acute Institutional Stay (SKILLED_NURSING_FACILITY): Payer: Medicare Other | Admitting: Internal Medicine

## 2012-08-20 DIAGNOSIS — M19072 Primary osteoarthritis, left ankle and foot: Secondary | ICD-10-CM

## 2012-08-20 DIAGNOSIS — M109 Gout, unspecified: Secondary | ICD-10-CM

## 2012-08-20 DIAGNOSIS — M19079 Primary osteoarthritis, unspecified ankle and foot: Secondary | ICD-10-CM | POA: Diagnosis not present

## 2012-09-07 DIAGNOSIS — R531 Weakness: Secondary | ICD-10-CM | POA: Diagnosis present

## 2012-09-07 DIAGNOSIS — L570 Actinic keratosis: Secondary | ICD-10-CM | POA: Diagnosis not present

## 2012-09-07 DIAGNOSIS — L8991 Pressure ulcer of unspecified site, stage 1: Secondary | ICD-10-CM | POA: Diagnosis not present

## 2012-09-07 DIAGNOSIS — L988 Other specified disorders of the skin and subcutaneous tissue: Secondary | ICD-10-CM | POA: Diagnosis not present

## 2012-09-07 DIAGNOSIS — L98411 Non-pressure chronic ulcer of buttock limited to breakdown of skin: Secondary | ICD-10-CM | POA: Diagnosis not present

## 2012-09-07 DIAGNOSIS — D485 Neoplasm of uncertain behavior of skin: Secondary | ICD-10-CM | POA: Diagnosis not present

## 2012-09-07 DIAGNOSIS — I10 Essential (primary) hypertension: Secondary | ICD-10-CM | POA: Diagnosis not present

## 2012-09-07 DIAGNOSIS — R6889 Other general symptoms and signs: Secondary | ICD-10-CM | POA: Diagnosis not present

## 2012-09-07 DIAGNOSIS — L603 Nail dystrophy: Secondary | ICD-10-CM | POA: Diagnosis not present

## 2012-09-07 DIAGNOSIS — L989 Disorder of the skin and subcutaneous tissue, unspecified: Secondary | ICD-10-CM | POA: Diagnosis not present

## 2012-09-07 DIAGNOSIS — Z8619 Personal history of other infectious and parasitic diseases: Secondary | ICD-10-CM | POA: Diagnosis not present

## 2012-09-07 DIAGNOSIS — L98499 Non-pressure chronic ulcer of skin of other sites with unspecified severity: Secondary | ICD-10-CM | POA: Diagnosis not present

## 2012-09-07 DIAGNOSIS — Z780 Asymptomatic menopausal state: Secondary | ICD-10-CM | POA: Diagnosis not present

## 2012-09-07 DIAGNOSIS — F0391 Unspecified dementia with behavioral disturbance: Secondary | ICD-10-CM | POA: Diagnosis not present

## 2012-09-07 DIAGNOSIS — H43819 Vitreous degeneration, unspecified eye: Secondary | ICD-10-CM | POA: Diagnosis not present

## 2012-09-07 DIAGNOSIS — Z5189 Encounter for other specified aftercare: Secondary | ICD-10-CM | POA: Diagnosis not present

## 2012-09-07 DIAGNOSIS — N390 Urinary tract infection, site not specified: Secondary | ICD-10-CM | POA: Diagnosis not present

## 2012-09-07 DIAGNOSIS — E559 Vitamin D deficiency, unspecified: Secondary | ICD-10-CM | POA: Diagnosis not present

## 2012-09-07 DIAGNOSIS — Z79899 Other long term (current) drug therapy: Secondary | ICD-10-CM | POA: Diagnosis not present

## 2012-09-07 DIAGNOSIS — I2510 Atherosclerotic heart disease of native coronary artery without angina pectoris: Secondary | ICD-10-CM | POA: Diagnosis not present

## 2012-09-07 DIAGNOSIS — IMO0001 Reserved for inherently not codable concepts without codable children: Secondary | ICD-10-CM | POA: Diagnosis not present

## 2012-09-07 DIAGNOSIS — E039 Hypothyroidism, unspecified: Secondary | ICD-10-CM | POA: Diagnosis not present

## 2012-09-07 DIAGNOSIS — E1140 Type 2 diabetes mellitus with diabetic neuropathy, unspecified: Secondary | ICD-10-CM | POA: Diagnosis not present

## 2012-09-07 DIAGNOSIS — N393 Stress incontinence (female) (male): Secondary | ICD-10-CM | POA: Diagnosis not present

## 2012-09-07 DIAGNOSIS — M79674 Pain in right toe(s): Secondary | ICD-10-CM | POA: Diagnosis not present

## 2012-09-07 DIAGNOSIS — R0602 Shortness of breath: Secondary | ICD-10-CM | POA: Diagnosis present

## 2012-09-07 DIAGNOSIS — M159 Polyosteoarthritis, unspecified: Secondary | ICD-10-CM | POA: Diagnosis not present

## 2012-09-07 DIAGNOSIS — M2041 Other hammer toe(s) (acquired), right foot: Secondary | ICD-10-CM | POA: Diagnosis not present

## 2012-09-07 DIAGNOSIS — F259 Schizoaffective disorder, unspecified: Secondary | ICD-10-CM | POA: Diagnosis not present

## 2012-09-07 DIAGNOSIS — E785 Hyperlipidemia, unspecified: Secondary | ICD-10-CM | POA: Diagnosis not present

## 2012-09-07 DIAGNOSIS — E1169 Type 2 diabetes mellitus with other specified complication: Secondary | ICD-10-CM | POA: Diagnosis not present

## 2012-09-07 DIAGNOSIS — E1142 Type 2 diabetes mellitus with diabetic polyneuropathy: Secondary | ICD-10-CM | POA: Diagnosis not present

## 2012-09-07 DIAGNOSIS — Z1231 Encounter for screening mammogram for malignant neoplasm of breast: Secondary | ICD-10-CM | POA: Diagnosis not present

## 2012-09-07 DIAGNOSIS — M154 Erosive (osteo)arthritis: Secondary | ICD-10-CM | POA: Diagnosis not present

## 2012-09-07 DIAGNOSIS — H26499 Other secondary cataract, unspecified eye: Secondary | ICD-10-CM | POA: Diagnosis not present

## 2012-09-07 DIAGNOSIS — M2040 Other hammer toe(s) (acquired), unspecified foot: Secondary | ICD-10-CM | POA: Diagnosis not present

## 2012-09-07 DIAGNOSIS — D0460 Carcinoma in situ of skin of unspecified upper limb, including shoulder: Secondary | ICD-10-CM | POA: Diagnosis not present

## 2012-09-07 DIAGNOSIS — H35059 Retinal neovascularization, unspecified, unspecified eye: Secondary | ICD-10-CM | POA: Diagnosis not present

## 2012-09-07 DIAGNOSIS — T148 Other injury of unspecified body region: Secondary | ICD-10-CM | POA: Diagnosis not present

## 2012-09-07 DIAGNOSIS — E1149 Type 2 diabetes mellitus with other diabetic neurological complication: Secondary | ICD-10-CM | POA: Diagnosis not present

## 2012-09-07 DIAGNOSIS — F209 Schizophrenia, unspecified: Secondary | ICD-10-CM | POA: Diagnosis not present

## 2012-09-07 DIAGNOSIS — C4492 Squamous cell carcinoma of skin, unspecified: Secondary | ICD-10-CM | POA: Diagnosis not present

## 2012-09-07 DIAGNOSIS — M25569 Pain in unspecified knee: Secondary | ICD-10-CM | POA: Diagnosis not present

## 2012-09-07 DIAGNOSIS — L89309 Pressure ulcer of unspecified buttock, unspecified stage: Secondary | ICD-10-CM | POA: Diagnosis not present

## 2012-09-07 DIAGNOSIS — F329 Major depressive disorder, single episode, unspecified: Secondary | ICD-10-CM | POA: Diagnosis not present

## 2012-09-07 DIAGNOSIS — L8992 Pressure ulcer of unspecified site, stage 2: Secondary | ICD-10-CM | POA: Diagnosis not present

## 2012-09-07 DIAGNOSIS — H3531 Nonexudative age-related macular degeneration: Secondary | ICD-10-CM | POA: Diagnosis not present

## 2012-09-07 DIAGNOSIS — M150 Primary generalized (osteo)arthritis: Secondary | ICD-10-CM | POA: Diagnosis not present

## 2012-09-07 DIAGNOSIS — H01009 Unspecified blepharitis unspecified eye, unspecified eyelid: Secondary | ICD-10-CM | POA: Diagnosis not present

## 2012-09-07 DIAGNOSIS — J111 Influenza due to unidentified influenza virus with other respiratory manifestations: Secondary | ICD-10-CM | POA: Diagnosis not present

## 2012-09-07 DIAGNOSIS — Z7982 Long term (current) use of aspirin: Secondary | ICD-10-CM | POA: Diagnosis not present

## 2012-09-07 DIAGNOSIS — M1710 Unilateral primary osteoarthritis, unspecified knee: Secondary | ICD-10-CM | POA: Diagnosis not present

## 2012-09-07 DIAGNOSIS — M8580 Other specified disorders of bone density and structure, unspecified site: Secondary | ICD-10-CM | POA: Diagnosis not present

## 2012-09-07 DIAGNOSIS — M129 Arthropathy, unspecified: Secondary | ICD-10-CM | POA: Diagnosis not present

## 2012-09-07 DIAGNOSIS — H35039 Hypertensive retinopathy, unspecified eye: Secondary | ICD-10-CM | POA: Diagnosis not present

## 2012-09-07 DIAGNOSIS — H3530 Unspecified macular degeneration: Secondary | ICD-10-CM | POA: Diagnosis not present

## 2012-09-07 DIAGNOSIS — Z794 Long term (current) use of insulin: Secondary | ICD-10-CM | POA: Diagnosis not present

## 2012-09-07 DIAGNOSIS — E118 Type 2 diabetes mellitus with unspecified complications: Secondary | ICD-10-CM | POA: Diagnosis not present

## 2012-09-07 DIAGNOSIS — M79675 Pain in left toe(s): Secondary | ICD-10-CM | POA: Diagnosis not present

## 2012-09-07 DIAGNOSIS — E119 Type 2 diabetes mellitus without complications: Secondary | ICD-10-CM | POA: Diagnosis not present

## 2012-09-07 DIAGNOSIS — B351 Tinea unguium: Secondary | ICD-10-CM | POA: Diagnosis not present

## 2012-09-07 DIAGNOSIS — M109 Gout, unspecified: Secondary | ICD-10-CM | POA: Diagnosis not present

## 2012-09-07 DIAGNOSIS — B958 Unspecified staphylococcus as the cause of diseases classified elsewhere: Secondary | ICD-10-CM | POA: Diagnosis not present

## 2012-09-07 DIAGNOSIS — H18519 Endothelial corneal dystrophy, unspecified eye: Secondary | ICD-10-CM | POA: Diagnosis not present

## 2012-09-07 DIAGNOSIS — Z803 Family history of malignant neoplasm of breast: Secondary | ICD-10-CM | POA: Diagnosis not present

## 2012-09-07 DIAGNOSIS — Z7951 Long term (current) use of inhaled steroids: Secondary | ICD-10-CM | POA: Diagnosis not present

## 2012-09-07 DIAGNOSIS — R51 Headache: Secondary | ICD-10-CM | POA: Diagnosis not present

## 2012-09-07 DIAGNOSIS — J1189 Influenza due to unidentified influenza virus with other manifestations: Secondary | ICD-10-CM | POA: Diagnosis not present

## 2012-09-07 DIAGNOSIS — R293 Abnormal posture: Secondary | ICD-10-CM | POA: Diagnosis not present

## 2012-09-07 DIAGNOSIS — R21 Rash and other nonspecific skin eruption: Secondary | ICD-10-CM | POA: Diagnosis not present

## 2012-09-07 DIAGNOSIS — R222 Localized swelling, mass and lump, trunk: Secondary | ICD-10-CM | POA: Diagnosis not present

## 2012-09-07 DIAGNOSIS — E669 Obesity, unspecified: Secondary | ICD-10-CM | POA: Diagnosis not present

## 2012-09-07 DIAGNOSIS — Z029 Encounter for administrative examinations, unspecified: Secondary | ICD-10-CM | POA: Diagnosis not present

## 2012-09-07 DIAGNOSIS — H35033 Hypertensive retinopathy, bilateral: Secondary | ICD-10-CM | POA: Diagnosis not present

## 2012-09-07 DIAGNOSIS — Z87891 Personal history of nicotine dependence: Secondary | ICD-10-CM | POA: Diagnosis not present

## 2012-09-07 DIAGNOSIS — H269 Unspecified cataract: Secondary | ICD-10-CM | POA: Diagnosis not present

## 2012-09-07 DIAGNOSIS — J309 Allergic rhinitis, unspecified: Secondary | ICD-10-CM | POA: Diagnosis not present

## 2012-09-07 DIAGNOSIS — D649 Anemia, unspecified: Secondary | ICD-10-CM | POA: Diagnosis not present

## 2012-09-07 DIAGNOSIS — L03115 Cellulitis of right lower limb: Secondary | ICD-10-CM | POA: Diagnosis not present

## 2012-09-07 DIAGNOSIS — R05 Cough: Secondary | ICD-10-CM | POA: Diagnosis not present

## 2012-09-07 DIAGNOSIS — J811 Chronic pulmonary edema: Secondary | ICD-10-CM | POA: Diagnosis not present

## 2012-09-07 DIAGNOSIS — M79671 Pain in right foot: Secondary | ICD-10-CM | POA: Diagnosis not present

## 2012-09-07 DIAGNOSIS — K219 Gastro-esophageal reflux disease without esophagitis: Secondary | ICD-10-CM | POA: Diagnosis not present

## 2012-09-07 DIAGNOSIS — M79676 Pain in unspecified toe(s): Secondary | ICD-10-CM | POA: Diagnosis not present

## 2012-09-07 DIAGNOSIS — Z23 Encounter for immunization: Secondary | ICD-10-CM | POA: Diagnosis not present

## 2012-09-07 DIAGNOSIS — H3509 Other intraretinal microvascular abnormalities: Secondary | ICD-10-CM | POA: Diagnosis not present

## 2012-09-07 DIAGNOSIS — F0390 Unspecified dementia without behavioral disturbance: Secondary | ICD-10-CM | POA: Diagnosis not present

## 2012-09-07 DIAGNOSIS — J984 Other disorders of lung: Secondary | ICD-10-CM | POA: Diagnosis not present

## 2012-09-07 DIAGNOSIS — R262 Difficulty in walking, not elsewhere classified: Secondary | ICD-10-CM | POA: Diagnosis not present

## 2012-09-07 DIAGNOSIS — R350 Frequency of micturition: Secondary | ICD-10-CM | POA: Diagnosis not present

## 2012-09-07 DIAGNOSIS — F29 Unspecified psychosis not due to a substance or known physiological condition: Secondary | ICD-10-CM | POA: Diagnosis not present

## 2012-09-07 DIAGNOSIS — J301 Allergic rhinitis due to pollen: Secondary | ICD-10-CM | POA: Diagnosis not present

## 2012-09-07 DIAGNOSIS — G2401 Drug induced subacute dyskinesia: Secondary | ICD-10-CM | POA: Diagnosis not present

## 2012-09-07 DIAGNOSIS — H3532 Exudative age-related macular degeneration: Secondary | ICD-10-CM | POA: Diagnosis not present

## 2012-09-07 DIAGNOSIS — G3184 Mild cognitive impairment, so stated: Secondary | ICD-10-CM | POA: Diagnosis not present

## 2012-09-07 DIAGNOSIS — M6281 Muscle weakness (generalized): Secondary | ICD-10-CM | POA: Diagnosis not present

## 2012-09-07 DIAGNOSIS — D520 Dietary folate deficiency anemia: Secondary | ICD-10-CM | POA: Diagnosis not present

## 2012-09-07 DIAGNOSIS — K5900 Constipation, unspecified: Secondary | ICD-10-CM | POA: Diagnosis not present

## 2012-09-07 DIAGNOSIS — M2042 Other hammer toe(s) (acquired), left foot: Secondary | ICD-10-CM | POA: Diagnosis not present

## 2012-09-07 DIAGNOSIS — G250 Essential tremor: Secondary | ICD-10-CM | POA: Diagnosis not present

## 2012-09-07 DIAGNOSIS — R0989 Other specified symptoms and signs involving the circulatory and respiratory systems: Secondary | ICD-10-CM | POA: Diagnosis not present

## 2012-09-07 DIAGNOSIS — H43813 Vitreous degeneration, bilateral: Secondary | ICD-10-CM | POA: Diagnosis not present

## 2012-09-07 DIAGNOSIS — Z8262 Family history of osteoporosis: Secondary | ICD-10-CM | POA: Diagnosis not present

## 2012-09-07 DIAGNOSIS — F0280 Dementia in other diseases classified elsewhere without behavioral disturbance: Secondary | ICD-10-CM | POA: Diagnosis not present

## 2012-09-07 DIAGNOSIS — M533 Sacrococcygeal disorders, not elsewhere classified: Secondary | ICD-10-CM | POA: Diagnosis not present

## 2012-09-07 DIAGNOSIS — L608 Other nail disorders: Secondary | ICD-10-CM | POA: Diagnosis not present

## 2012-09-07 DIAGNOSIS — Z8742 Personal history of other diseases of the female genital tract: Secondary | ICD-10-CM | POA: Diagnosis not present

## 2012-09-07 DIAGNOSIS — Q828 Other specified congenital malformations of skin: Secondary | ICD-10-CM | POA: Diagnosis not present

## 2012-09-07 DIAGNOSIS — F200 Paranoid schizophrenia: Secondary | ICD-10-CM | POA: Diagnosis not present

## 2012-09-07 DIAGNOSIS — M1991 Primary osteoarthritis, unspecified site: Secondary | ICD-10-CM | POA: Diagnosis not present

## 2012-09-07 DIAGNOSIS — R1310 Dysphagia, unspecified: Secondary | ICD-10-CM | POA: Diagnosis not present

## 2012-09-07 DIAGNOSIS — E1159 Type 2 diabetes mellitus with other circulatory complications: Secondary | ICD-10-CM | POA: Diagnosis not present

## 2012-09-07 DIAGNOSIS — E1165 Type 2 diabetes mellitus with hyperglycemia: Secondary | ICD-10-CM | POA: Diagnosis not present

## 2012-09-07 DIAGNOSIS — D049 Carcinoma in situ of skin, unspecified: Secondary | ICD-10-CM | POA: Diagnosis not present

## 2012-09-07 DIAGNOSIS — R030 Elevated blood-pressure reading, without diagnosis of hypertension: Secondary | ICD-10-CM | POA: Diagnosis not present

## 2012-09-07 DIAGNOSIS — H02829 Cysts of unspecified eye, unspecified eyelid: Secondary | ICD-10-CM | POA: Diagnosis not present

## 2012-09-07 DIAGNOSIS — D518 Other vitamin B12 deficiency anemias: Secondary | ICD-10-CM | POA: Diagnosis not present

## 2012-09-07 DIAGNOSIS — Z862 Personal history of diseases of the blood and blood-forming organs and certain disorders involving the immune mechanism: Secondary | ICD-10-CM | POA: Diagnosis not present

## 2012-09-23 ENCOUNTER — Non-Acute Institutional Stay (SKILLED_NURSING_FACILITY): Payer: Medicare Other | Admitting: Adult Health

## 2012-09-23 DIAGNOSIS — J309 Allergic rhinitis, unspecified: Secondary | ICD-10-CM | POA: Diagnosis not present

## 2012-09-23 DIAGNOSIS — F209 Schizophrenia, unspecified: Secondary | ICD-10-CM

## 2012-09-23 DIAGNOSIS — K219 Gastro-esophageal reflux disease without esophagitis: Secondary | ICD-10-CM

## 2012-09-23 DIAGNOSIS — I10 Essential (primary) hypertension: Secondary | ICD-10-CM | POA: Diagnosis not present

## 2012-09-23 DIAGNOSIS — E785 Hyperlipidemia, unspecified: Secondary | ICD-10-CM

## 2012-09-23 DIAGNOSIS — F329 Major depressive disorder, single episode, unspecified: Secondary | ICD-10-CM

## 2012-09-23 DIAGNOSIS — F0390 Unspecified dementia without behavioral disturbance: Secondary | ICD-10-CM

## 2012-09-23 DIAGNOSIS — E119 Type 2 diabetes mellitus without complications: Secondary | ICD-10-CM | POA: Diagnosis not present

## 2012-09-24 ENCOUNTER — Other Ambulatory Visit (INDEPENDENT_AMBULATORY_CARE_PROVIDER_SITE_OTHER): Payer: Medicare Other | Admitting: Ophthalmology

## 2012-09-24 DIAGNOSIS — H35059 Retinal neovascularization, unspecified, unspecified eye: Secondary | ICD-10-CM | POA: Diagnosis not present

## 2012-09-28 NOTE — Assessment & Plan Note (Signed)
Recently had depressive episode that required [facility] stay b/c she was not eating or drinking and had delusional thoughts that she was dying from TB.  Now on lexapro and geodon.

## 2012-09-28 NOTE — Assessment & Plan Note (Signed)
On lipitor.  Last levels at goal in [facility].  Need more info from [place]--unclear if this and hba1c were retested at that time.

## 2012-09-28 NOTE — Assessment & Plan Note (Addendum)
Continue to monitor.  Should have repeat MMSE.  Again, need more records from [place] about mentation testing.  Continue aricept.  Consider uptitrating and adding namenda.

## 2012-09-28 NOTE — Assessment & Plan Note (Signed)
To receive pt, ot here.  Has h/o falling.

## 2012-09-28 NOTE — Assessment & Plan Note (Signed)
Goal <150/90 on valsartan/hctz.

## 2012-09-28 NOTE — Assessment & Plan Note (Signed)
Continue lexapro with geodon for delusions.  Definitely needs NCEPS to follow her.

## 2012-09-28 NOTE — Assessment & Plan Note (Signed)
Stable with asa 81mg , metformin 500mg  po bid with meals, ARB, statin.  F/u hba1c.

## 2012-10-01 ENCOUNTER — Encounter (INDEPENDENT_AMBULATORY_CARE_PROVIDER_SITE_OTHER): Payer: Medicare Other | Admitting: Ophthalmology

## 2012-10-01 DIAGNOSIS — L608 Other nail disorders: Secondary | ICD-10-CM | POA: Diagnosis not present

## 2012-10-01 DIAGNOSIS — H35059 Retinal neovascularization, unspecified, unspecified eye: Secondary | ICD-10-CM | POA: Diagnosis not present

## 2012-10-01 DIAGNOSIS — E1149 Type 2 diabetes mellitus with other diabetic neurological complication: Secondary | ICD-10-CM | POA: Diagnosis not present

## 2012-10-01 DIAGNOSIS — Q828 Other specified congenital malformations of skin: Secondary | ICD-10-CM | POA: Diagnosis not present

## 2012-10-06 DIAGNOSIS — E119 Type 2 diabetes mellitus without complications: Secondary | ICD-10-CM | POA: Diagnosis not present

## 2012-10-13 ENCOUNTER — Telehealth: Payer: Self-pay | Admitting: Family Medicine

## 2012-10-13 DIAGNOSIS — L988 Other specified disorders of the skin and subcutaneous tissue: Secondary | ICD-10-CM | POA: Diagnosis not present

## 2012-10-13 DIAGNOSIS — C4492 Squamous cell carcinoma of skin, unspecified: Secondary | ICD-10-CM | POA: Diagnosis not present

## 2012-10-13 NOTE — Telephone Encounter (Signed)
I had faxed back to Dr Dionne Bucy office a request from [facility] for signing for diabetic shoes with a note indicating that we can't sign since I saw her for a problem unrelated to diabetes. It's been over six months since she had a diabetes related visit to our clinic, so I requested that she arrange one with Dr Adriana Simas.

## 2012-10-14 ENCOUNTER — Telehealth: Payer: Self-pay | Admitting: Family Medicine

## 2012-10-14 ENCOUNTER — Ambulatory Visit: Payer: Medicare Other | Admitting: Family Medicine

## 2012-10-14 NOTE — Telephone Encounter (Signed)
Documents for Therapeutic Shoes

## 2012-10-15 ENCOUNTER — Non-Acute Institutional Stay (SKILLED_NURSING_FACILITY): Payer: Medicare Other | Admitting: Internal Medicine

## 2012-10-15 DIAGNOSIS — R269 Unspecified abnormalities of gait and mobility: Secondary | ICD-10-CM

## 2012-10-15 DIAGNOSIS — M79609 Pain in unspecified limb: Secondary | ICD-10-CM

## 2012-10-15 DIAGNOSIS — E785 Hyperlipidemia, unspecified: Secondary | ICD-10-CM | POA: Diagnosis not present

## 2012-10-15 DIAGNOSIS — F03A0 Unspecified dementia, mild, without behavioral disturbance, psychotic disturbance, mood disturbance, and anxiety: Secondary | ICD-10-CM

## 2012-10-15 DIAGNOSIS — Z780 Asymptomatic menopausal state: Secondary | ICD-10-CM | POA: Diagnosis not present

## 2012-10-15 DIAGNOSIS — E119 Type 2 diabetes mellitus without complications: Secondary | ICD-10-CM

## 2012-10-15 DIAGNOSIS — F0390 Unspecified dementia without behavioral disturbance: Secondary | ICD-10-CM | POA: Diagnosis not present

## 2012-10-15 DIAGNOSIS — M79672 Pain in left foot: Secondary | ICD-10-CM

## 2012-10-15 DIAGNOSIS — F329 Major depressive disorder, single episode, unspecified: Secondary | ICD-10-CM | POA: Diagnosis not present

## 2012-10-15 DIAGNOSIS — R634 Abnormal weight loss: Secondary | ICD-10-CM

## 2012-10-15 DIAGNOSIS — Z1231 Encounter for screening mammogram for malignant neoplasm of breast: Secondary | ICD-10-CM | POA: Diagnosis not present

## 2012-10-15 DIAGNOSIS — Z803 Family history of malignant neoplasm of breast: Secondary | ICD-10-CM | POA: Diagnosis not present

## 2012-10-15 DIAGNOSIS — F3289 Other specified depressive episodes: Secondary | ICD-10-CM

## 2012-10-15 DIAGNOSIS — B354 Tinea corporis: Secondary | ICD-10-CM | POA: Insufficient documentation

## 2012-10-15 DIAGNOSIS — I10 Essential (primary) hypertension: Secondary | ICD-10-CM

## 2012-10-15 NOTE — Assessment & Plan Note (Signed)
Her daughter would like her to have fewer cbgs due to sore fingers.  I have changed her cbgs to daily.  D/c novolog sliding scale.  Start lantus 10 units at bedtime.  Resume metformin at 250mg  po bid with meals.

## 2012-10-15 NOTE — Progress Notes (Signed)
Patient ID: Debra Tran, female   DOB: [birth year], 79 y.o.   MRN: [id number] Location:  Location:  [facility]bra Tran, D.O., C.M.D.  Code Status: full code  No Known Allergies  Chief Complaint  Patient presents with  . Medical Managment of Chronic Issues    had care plan meeting with patient's daughter    HPI: Patient is a 79 y.o. white female seen today after a care plan meeting with her daughter with the social worker and Leisure centre manager.   She had questions about her diabetes management--she is currently on novolog 5 units ac meals with cbgs qid.  She has c/o pain in the arch of her left foot to her daughter.  She c/o congestion to me and intermittent coughing.  She has a h/o chronic sinusitis.  She also has ringworm on her hip.    Review of Systems:  Review of Systems  Constitutional: Negative for fever and chills.  HENT: Positive for congestion.   Eyes: Positive for blurred vision.  Respiratory: Positive for cough.   Cardiovascular: Negative for chest pain.  Gastrointestinal: Positive for diarrhea. Negative for constipation.  Genitourinary: Negative for dysuria.  Musculoskeletal: Negative for falls.  Neurological: Negative for dizziness.  Endo/Heme/Allergies: Does not bruise/bleed easily.  Psychiatric/Behavioral: Positive for memory loss.     Past Medical History  Diagnosis Date  . Phobia   . Macular degeneration   . Narcissism   . Schizophrenia, paranoid   . Fungal dermatitis   . Normocytic anemia   . Hypertension   . Hyperlipidemia   . Mild cognitive impairment   . Diabetes mellitus type 2 in obese   . Stress incontinence, female   . Depressive disorder   . Allergic rhinitis     Past Surgical History  Procedure Laterality Date  . Cataract extraction  6 16 2006  . Cryotherapy  2 15 2006    facial AK  . Total abdominal hysterectomy w/ bilateral salpingoophorectomy      due to endometriosis  non malignant  . Hyperplastic   4 20  2006    AK shave biopsy  . Abdominal hysterectomy      due to fibroids    Social History:   reports that she has quit smoking. She does not have any smokeless tobacco history on file. She reports that she does not drink alcohol. Her drug history is not on file.  Family History  Problem Relation Age of Onset  . Heart disease Mother   . Stroke Mother   . Heart disease Father   . Stroke Father   . Cancer Cousin     liver and colon    Medications: Patient's Medications  New Prescriptions   No medications on file  Previous Medications   ASPIRIN EC 81 MG TABLET    Take 81 mg by mouth every morning.    ATORVASTATIN (LIPITOR) 20 MG TABLET    Take 1 tablet (20 mg total) by mouth at bedtime.   CALCIUM CARBONATE-VITAMIN D (CALCIUM 600-D) 600-400 MG-UNIT PER TABLET    Take 1 tablet by mouth every morning.    CLOTRIMAZOLE (LOTRIMIN) 1 % CREAM    Apply 1 application topically 2 (two) times daily.   DOCUSATE SODIUM (COLACE) 100 MG CAPSULE    Take 100 mg by mouth 2 (two) times daily.   DONEPEZIL (ARICEPT) 5 MG TABLET    Take 5 mg by mouth at bedtime.   ESCITALOPRAM (LEXAPRO) 10 MG TABLET  Take 20 mg by mouth at bedtime.    FLUTICASONE (FLONASE) 50 MCG/ACT NASAL SPRAY    Place 2 sprays into the nose every morning.    FOOT CARE PRODUCTS (CORN CUSHIONS) PADS    1 Device by Does not apply route daily.   INSULIN GLARGINE (LANTUS) 100 UNIT/ML INJECTION    Inject 10 Units into the skin at bedtime.   KETOCONAZOLE (NIZORAL) 2 % CREAM    Apply 1 application topically 2 (two) times daily. Apply to groin and buttocks 2 times a day   MULTIPLE VITAMIN (MULTIVITAMIN WITH MINERALS) TABS    Take 1 tablet by mouth every morning.   NORTRIPTYLINE (PAMELOR) 75 MG CAPSULE    Take 75 mg by mouth at bedtime.   OLANZAPINE (ZYPREXA) 5 MG TABLET    Take 1 tablet (5 mg total) by mouth at bedtime.   OMEPRAZOLE (PRILOSEC) 20 MG CAPSULE    Take 1 capsule (20 mg total) by mouth 2 (two) times daily.   PREDNISOLONE ACETATE  (PRED FORTE) 1 % OPHTHALMIC SUSPENSION    1 drop 4 (four) times daily.   VITAMIN C (ASCORBIC ACID) 500 MG TABLET    Take 500 mg by mouth daily.  Modified Medications   Modified Medication Previous Medication   METFORMIN (GLUCOPHAGE) 500 MG TABLET metFORMIN (GLUCOPHAGE) 500 MG tablet      Take 250 mg by mouth 2 (two) times daily with a meal.    Take 1 tablet (500 mg total) by mouth 2 (two) times daily with a meal.  Discontinued Medications   VALSARTAN-HYDROCHLOROTHIAZIDE (DIOVAN-HCT) 320-25 MG PER TABLET    Take 1 tablet by mouth every evening.   ZIPRASIDONE (GEODON) 20 MG CAPSULE    Take 20 mg by mouth 2 (two) times daily as needed. Take 1 tablet at bedtime. May take 1 additional tablet daily as needed for hallucinations     Physical Exam:  Physical Exam  Nursing note and vitals reviewed. Constitutional: She appears well-developed and well-nourished. No distress.  Cardiovascular: Normal rate, regular rhythm, normal heart sounds and intact distal pulses.   Pulmonary/Chest: Effort normal and breath sounds normal. No respiratory distress.  Abdominal: Soft. Bowel sounds are normal. She exhibits no distension. There is no tenderness.  Musculoskeletal:  Tender in arch of left foot but primarily at great toe joint  Neurological: She is alert.  Pleasant and conversive today  Skin:  Small round raised macule on right thigh     Labs reviewed: Basic Metabolic Panel:  Recent Labs  66/06/30 0805 05/31/12 1714 07/13/12 0017  NA 134* 133* 137  K 3.7 3.7 3.3*  CL 96 94* 98  CO2  --  26 23  GLUCOSE 122* 155* 61*  BUN 17 22 35*  CREATININE 1.00 0.81 1.09  CALCIUM  --  9.1 9.2   Liver Function Tests:  Recent Labs  05/31/12 1714 07/13/12 0017  AST 19 25  ALT 16 16  ALKPHOS 72 72  BILITOT 0.4 0.4  PROT 7.3 6.8  ALBUMIN 3.6 3.5  CBC:  Recent Labs  01/16/12 0805 05/31/12 1714 07/13/12 0017  WBC  --  7.0 8.1  NEUTROABS  --   --  5.2  HGB 15.3* 11.2* 11.8*  HCT 45.0 34.4*  36.2  MCV  --  80.9 81.3  PLT  --  234 272   Lab Results  Component Value Date   HGBA1C 6.3 12/12/2011  08/11/12:  Wbc 11.6, h/h 11.5/34.4, plts 311;  Na 141, K 3.7, BUN  20, cr 0.92 08/21/12:  Wbc 8.5, h/h 10.6/31.7, plts 311;  Na 140, K 3.7, BUN 20, cr 0.74, uric acid 5, BNP 63.6, alb 3.4 09/24/12:  Tc 129, TG 70, LDL 78, HDL 37, hba1c 6.1 Assessment/Plan Tinea corporis Start clotrimazole cream for rash on hip today bid for 4 wks.    Weight loss Recently stable.  Will d/c megace as she has been on this for more than 12 weeks and it only adds fluid weight and fat weight.  She is on protein supplements and multivitamins.  Dementia Moderate.  Is on donepezil low dose at this time.  Has difficulty remembering to use her walker and put her brakes on her wheelchair during transfers.  She is a fall risk.  Her daughter would like her to resume some PT.    Arch pain of left foot Suspect this is due to poor footwear.  Her uric acid level was previously normal and this is not the typical location.  Will recheck uric acid level.  May use tylenol for pain (standing order).  DIABETES MELLITUS II, UNCOMPLICATED Her daughter would like her to have fewer cbgs due to sore fingers.  I have changed her cbgs to daily.  D/c novolog sliding scale.  Start lantus 10 units at bedtime.  Resume metformin at 250mg  po bid with meals.     Labs/tests ordered:  Uric acid, bmp next draw

## 2012-10-15 NOTE — Assessment & Plan Note (Signed)
Recently stable.  Will d/c megace as she has been on this for more than 12 weeks and it only adds fluid weight and fat weight.  She is on protein supplements and multivitamins.

## 2012-10-15 NOTE — Assessment & Plan Note (Signed)
Suspect this is due to poor footwear.  Her uric acid level was previously normal and this is not the typical location.  Will recheck uric acid level.  May use tylenol for pain (standing order).

## 2012-10-15 NOTE — Assessment & Plan Note (Signed)
Moderate.  Is on donepezil low dose at this time.  Has difficulty remembering to use her walker and put her brakes on her wheelchair during transfers.  She is a fall risk.  Her daughter would like her to resume some PT.

## 2012-10-15 NOTE — Assessment & Plan Note (Signed)
Start clotrimazole cream for rash on hip today bid for 4 wks.

## 2012-10-16 ENCOUNTER — Other Ambulatory Visit: Payer: Self-pay

## 2012-10-20 DIAGNOSIS — M109 Gout, unspecified: Secondary | ICD-10-CM | POA: Diagnosis not present

## 2012-10-21 ENCOUNTER — Ambulatory Visit: Payer: Medicare Other | Admitting: Family Medicine

## 2012-10-28 ENCOUNTER — Ambulatory Visit: Payer: Medicare Other | Admitting: Family Medicine

## 2012-10-31 ENCOUNTER — Encounter (INDEPENDENT_AMBULATORY_CARE_PROVIDER_SITE_OTHER): Payer: Medicare Other | Admitting: Ophthalmology

## 2012-10-31 DIAGNOSIS — H43819 Vitreous degeneration, unspecified eye: Secondary | ICD-10-CM | POA: Diagnosis not present

## 2012-10-31 DIAGNOSIS — H35059 Retinal neovascularization, unspecified, unspecified eye: Secondary | ICD-10-CM | POA: Diagnosis not present

## 2012-11-14 ENCOUNTER — Encounter: Payer: Self-pay | Admitting: Internal Medicine

## 2012-11-14 NOTE — Progress Notes (Signed)
Patient ID: Debra Tran, female   DOB: 26-Feb-1928, 79 y.o.   MRN: [id number] Location:  Location:  [facility] Provider:  Gwenith Spitz. Renato Gails, D.O., C.M.D. Chief Complaint: left ankle swelling, pain  HPI:  79 yo female with history of gout was seen for an acute visit due to left ankle pain and swelling.  Thus far, no  have been done, but her daughter has advised that she has a h/o gout and she had xrays of her foot and ankle yesterday that do not reveal gouty changes.  She is having difficulty participating with therapy due to her pain.  She's had no fever and no known trauma to the left ankle.  She has not fallen in the context of the ankle swelling.  She has had CHF but her weights have been fairly stable--152 at admission and 151 lbs 08/11/12.  She has no swelling of the calf or leg, only the ankle.  Review of Systems:  Review of Systems  Constitutional: Negative for fever.  HENT: Negative for congestion.   Respiratory: Negative for shortness of breath.   Cardiovascular: Negative for chest pain.  Gastrointestinal: Negative for constipation.  Musculoskeletal: Positive for joint pain. Negative for myalgias and falls.  Skin: Negative for rash.  Psychiatric/Behavioral: Positive for memory loss.   Medications: Patient's Medications  New Prescriptions   No medications on file  Previous Medications   ASPIRIN EC 81 MG TABLET    Take 81 mg by mouth every morning.    ATORVASTATIN (LIPITOR) 20 MG TABLET    Take 1 tablet (20 mg total) by mouth at bedtime.   CALCIUM CARBONATE-VITAMIN D (CALCIUM 600-D) 600-400 MG-UNIT PER TABLET    Take 1 tablet by mouth every morning.    CLOTRIMAZOLE (LOTRIMIN) 1 % CREAM    Apply 1 application topically 2 (two) times daily.   DOCUSATE SODIUM (COLACE) 100 MG CAPSULE    Take 100 mg by mouth 2 (two) times daily.   DONEPEZIL (ARICEPT) 5 MG TABLET    Take 5 mg by mouth at bedtime.   ESCITALOPRAM (LEXAPRO) 10 MG TABLET    Take 20 mg by mouth at bedtime.     FLUTICASONE (FLONASE) 50 MCG/ACT NASAL SPRAY    Place 2 sprays into the nose every morning.    FOOT CARE PRODUCTS (CORN CUSHIONS) PADS    1 Device by Does not apply route daily.   INSULIN GLARGINE (LANTUS) 100 UNIT/ML INJECTION    Inject 10 Units into the skin at bedtime.   KETOCONAZOLE (NIZORAL) 2 % CREAM    Apply 1 application topically 2 (two) times daily. Apply to groin and buttocks 2 times a day   MULTIPLE VITAMIN (MULTIVITAMIN WITH MINERALS) TABS    Take 1 tablet by mouth every morning.   NORTRIPTYLINE (PAMELOR) 75 MG CAPSULE    Take 75 mg by mouth at bedtime.   OLANZAPINE (ZYPREXA) 5 MG TABLET    Take 1 tablet (5 mg total) by mouth at bedtime.   OMEPRAZOLE (PRILOSEC) 20 MG CAPSULE    Take 1 capsule (20 mg total) by mouth 2 (two) times daily.   PREDNISOLONE ACETATE (PRED FORTE) 1 % OPHTHALMIC SUSPENSION    1 drop 4 (four) times daily.   VITAMIN C (ASCORBIC ACID) 500 MG TABLET    Take 500 mg by mouth daily.  Modified Medications   Modified Medication Previous Medication   METFORMIN (GLUCOPHAGE) 500 MG TABLET metFORMIN (GLUCOPHAGE) 500 MG tablet      Take  250 mg by mouth 2 (two) times daily with a meal.    Take 1 tablet (500 mg total) by mouth 2 (two) times daily with a meal.  Discontinued Medications   VALSARTAN-HYDROCHLOROTHIAZIDE (DIOVAN-HCT) 320-25 MG PER TABLET    Take 1 tablet by mouth every evening.   ZIPRASIDONE (GEODON) 20 MG CAPSULE    Take 20 mg by mouth 2 (two) times daily as needed. Take 1 tablet at bedtime. May take 1 additional tablet daily as needed for hallucinations    Physical Exam: There were no vitals filed for this visit. Physical Exam  Constitutional: She appears well-developed and well-nourished. No distress.  HENT:  Head: Normocephalic and atraumatic.  Cardiovascular: Normal rate, regular rhythm and intact distal pulses.   Pulmonary/Chest: Effort normal and breath sounds normal.  Abdominal: Soft. Bowel sounds are normal. She exhibits no distension. There is  no tenderness.  Musculoskeletal: She exhibits edema and tenderness.  Of left ankle, no erythema, mild warmth, decreased ROM  Neurological: She is alert. No cranial nerve deficit.  Skin: Skin is warm and dry.     Labs reviewed: Basic Metabolic Panel:  Recent Labs  16/10/96 0805 05/31/12 1714 07/13/12 0017  NA 134* 133* 137  K 3.7 3.7 3.3*  CL 96 94* 98  CO2  --  26 23  GLUCOSE 122* 155* 61*  BUN 17 22 35*  CREATININE 1.00 0.81 1.09  CALCIUM  --  9.1 9.2    Liver Function Tests:  Recent Labs  05/31/12 1714 07/13/12 0017  AST 19 25  ALT 16 16  ALKPHOS 72 72  BILITOT 0.4 0.4  PROT 7.3 6.8  ALBUMIN 3.6 3.5    CBC:  Recent Labs  01/16/12 0805 05/31/12 1714 07/13/12 0017  WBC  --  7.0 8.1  NEUTROABS  --   --  5.2  HGB 15.3* 11.2* 11.8*  HCT 45.0 34.4* 36.2  MCV  --  80.9 81.3  PLT  --  234 272   Significant Diagnostic Results:  Left ankle and foot xrays 08/19/12:  Calcaneal osteophytes, hallux valgus, nothing acute in ankle  Assessment/Plan 1. Osteoarthritis of ankle and foot, left --appears she has degenerative arthritis in her heel with heel spurs, but no evidence of gouty tophi or pseudogout evident --did order labs with cbc, cmp, bnp, uric acid --elevate feet  2. Gout --check uric acid level --if elevated >6, would treat with colcrys for a 6 month period and consider allopurinol lifelong --she is no longer on hctz which could precipitate gout  Family/ staff Communication: discussed with LPN caring for resident Labs/tests ordered:  Cbc, cmp, bnp, uric acid

## 2012-12-02 DIAGNOSIS — L608 Other nail disorders: Secondary | ICD-10-CM | POA: Diagnosis not present

## 2012-12-02 DIAGNOSIS — E1149 Type 2 diabetes mellitus with other diabetic neurological complication: Secondary | ICD-10-CM | POA: Diagnosis not present

## 2012-12-14 ENCOUNTER — Encounter: Payer: Self-pay | Admitting: Internal Medicine

## 2012-12-14 ENCOUNTER — Non-Acute Institutional Stay (SKILLED_NURSING_FACILITY): Payer: Medicare Other | Admitting: Internal Medicine

## 2012-12-14 DIAGNOSIS — F0390 Unspecified dementia without behavioral disturbance: Secondary | ICD-10-CM | POA: Diagnosis not present

## 2012-12-14 DIAGNOSIS — F329 Major depressive disorder, single episode, unspecified: Secondary | ICD-10-CM

## 2012-12-14 DIAGNOSIS — G250 Essential tremor: Secondary | ICD-10-CM

## 2012-12-14 DIAGNOSIS — I10 Essential (primary) hypertension: Secondary | ICD-10-CM

## 2012-12-14 DIAGNOSIS — F3289 Other specified depressive episodes: Secondary | ICD-10-CM

## 2012-12-14 DIAGNOSIS — F209 Schizophrenia, unspecified: Secondary | ICD-10-CM

## 2012-12-14 DIAGNOSIS — E119 Type 2 diabetes mellitus without complications: Secondary | ICD-10-CM

## 2012-12-14 NOTE — Progress Notes (Signed)
Patient ID: Debra Tran, female   DOB: 02-Apr-1928, 79 y.o.   MRN: [id number] Location:  Location:  [facility] Provider:  Gwenith Spitz. Renato Gails, D.O., C.M.D.  Code Status:  Full code   Chief Complaint  Patient presents with  . Medical Managment of Chronic Issues    daughter asking if medications are causing her tremor    HPI:  79 yo female with h/o paranoid schizophrenia, depression, dementia thought to also be due to ECT in the past, allergic rhinitis, DMII on metformin and lantus 10 units, GERD, hyperlipidemia was seen for medical management of her chronic diseases and to address her tremor.  Pt herself notes tremor is worse in her left hand and it happens mostly when she is trying to do something.  Her handwriting has deteriorated.  She has no shuffling gait or cogwheeling rigidity.  She has no had any recent falls.  Her mood is good at present.  Her CBG this am was 118.  Review of Systems:  Review of Systems  Constitutional: Negative for fever and chills.  HENT: Negative for congestion.   Eyes: Negative for blurred vision.  Respiratory: Negative for shortness of breath.   Cardiovascular: Negative for chest pain.  Gastrointestinal: Negative for abdominal pain.  Genitourinary: Negative for dysuria.  Musculoskeletal: Positive for joint pain. Negative for falls.  Neurological: Positive for tremors. Negative for headaches.  Psychiatric/Behavioral: Positive for depression and memory loss. The patient is nervous/anxious.     Medications: Patient's Medications  New Prescriptions   No medications on file  Previous Medications   ASPIRIN EC 81 MG TABLET    Take 81 mg by mouth every morning.    ATORVASTATIN (LIPITOR) 20 MG TABLET    Take 1 tablet (20 mg total) by mouth at bedtime.   CALCIUM CARBONATE-VITAMIN D (CALCIUM 600-D) 600-400 MG-UNIT PER TABLET    Take 1 tablet by mouth every morning.    CLOTRIMAZOLE (LOTRIMIN) 1 % CREAM    Apply 1 application topically 2 (two) times  daily.   DOCUSATE SODIUM (COLACE) 100 MG CAPSULE    Take 100 mg by mouth 2 (two) times daily.   DONEPEZIL (ARICEPT) 5 MG TABLET    Take 5 mg by mouth at bedtime.   ESCITALOPRAM (LEXAPRO) 10 MG TABLET    Take 20 mg by mouth at bedtime.    FLUTICASONE (FLONASE) 50 MCG/ACT NASAL SPRAY    Place 2 sprays into the nose every morning.    FOOT CARE PRODUCTS (CORN CUSHIONS) PADS    1 Device by Does not apply route daily.   INSULIN GLARGINE (LANTUS) 100 UNIT/ML INJECTION    Inject 10 Units into the skin at bedtime.   KETOCONAZOLE (NIZORAL) 2 % CREAM    Apply 1 application topically 2 (two) times daily. Apply to groin and buttocks 2 times a day   METFORMIN (GLUCOPHAGE) 500 MG TABLET    Take 250 mg by mouth 2 (two) times daily with a meal.   MULTIPLE VITAMIN (MULTIVITAMIN WITH MINERALS) TABS    Take 1 tablet by mouth every morning.   NORTRIPTYLINE (PAMELOR) 75 MG CAPSULE    Take 75 mg by mouth at bedtime.   OLANZAPINE (ZYPREXA) 5 MG TABLET    Take 1 tablet (5 mg total) by mouth at bedtime.   OMEPRAZOLE (PRILOSEC) 20 MG CAPSULE    Take 1 capsule (20 mg total) by mouth 2 (two) times daily.   PREDNISOLONE ACETATE (PRED FORTE) 1 % OPHTHALMIC SUSPENSION  1 drop 4 (four) times daily.   VITAMIN C (ASCORBIC ACID) 500 MG TABLET    Take 500 mg by mouth daily.  Modified Medications   No medications on file  Discontinued Medications   No medications on file    Physical Exam: Filed Vitals:   12/14/12 1203  BP: 129/72  Pulse: 67  Temp: 97.2 F (36.2 C)  Resp: 18  Physical Exam  Constitutional: She appears well-developed and well-nourished. No distress.  HENT:  Head: Normocephalic and atraumatic.  Eyes: EOM are normal. Pupils are equal, round, and reactive to light.  Cardiovascular: Normal rate, regular rhythm, normal heart sounds and intact distal pulses.   Pulmonary/Chest: Effort normal and breath sounds normal.  Abdominal: Soft. Bowel sounds are normal. She exhibits no distension. There is no  tenderness.  Musculoskeletal: Normal range of motion.  Neurological: She is alert.  Tremor--present with action--finger to nose, picking up items  Skin: Skin is warm and dry. There is pallor.    Labs reviewed: Basic Metabolic Panel:  Recent Labs  47/82/95 0805 05/31/12 1714 07/13/12 0017  NA 134* 133* 137  K 3.7 3.7 3.3*  CL 96 94* 98  CO2  --  26 23  GLUCOSE 122* 155* 61*  BUN 17 22 35*  CREATININE 1.00 0.81 1.09  CALCIUM  --  9.1 9.2    Liver Function Tests:  Recent Labs  05/31/12 1714 07/13/12 0017  AST 19 25  ALT 16 16  ALKPHOS 72 72  BILITOT 0.4 0.4  PROT 7.3 6.8  ALBUMIN 3.6 3.5    CBC:  Recent Labs  01/16/12 0805 05/31/12 1714 07/13/12 0017  WBC  --  7.0 8.1  NEUTROABS  --   --  5.2  HGB 15.3* 11.2* 11.8*  HCT 45.0 34.4* 36.2  MCV  --  80.9 81.3  PLT  --  234 272  10/06/12:  hba1c 6.6, lipids at goal 7/14:  Uric acid 6  Significant Diagnostic Results:  10/15/12:  Mammogram at [facility]:  Normal Saw Dr. Ashley Royalty for f/u ophthalmology  Assessment/Plan 1. Dementia, without behavioral disturbance -stable, cont aricept  2. Type II or unspecified type diabetes mellitus without mention of complication, not stated as uncontrolled -last hba1c at goal 6/30 -cont lantus 10 with novolog 5 for cbgs over 150  3. Unspecified schizophrenia, unspecified condition -h/o shock treatments -cont lexapro, pamelor for depression, not currently on antipsychotics  4. Essential hypertension, benign -bp at goal with current therapy  5. Depressive disorder, not elsewhere classified -cont lexapro and pamelor  6.  Essential tremor:  Could try primidone for this if she and her daughter desire, would not use propanolol b/c she is already dizzy at times  Family/ staff Communication: discussed with nursing staff and DON Goals of care: full code Labs/tests ordered:  Bmp, urine microalbumin

## 2012-12-15 DIAGNOSIS — E119 Type 2 diabetes mellitus without complications: Secondary | ICD-10-CM | POA: Diagnosis not present

## 2012-12-15 DIAGNOSIS — M159 Polyosteoarthritis, unspecified: Secondary | ICD-10-CM | POA: Diagnosis not present

## 2012-12-15 DIAGNOSIS — E559 Vitamin D deficiency, unspecified: Secondary | ICD-10-CM | POA: Diagnosis not present

## 2012-12-20 DIAGNOSIS — M109 Gout, unspecified: Secondary | ICD-10-CM | POA: Diagnosis not present

## 2012-12-21 DIAGNOSIS — N390 Urinary tract infection, site not specified: Secondary | ICD-10-CM | POA: Diagnosis not present

## 2013-01-05 DIAGNOSIS — F29 Unspecified psychosis not due to a substance or known physiological condition: Secondary | ICD-10-CM | POA: Diagnosis not present

## 2013-01-16 ENCOUNTER — Non-Acute Institutional Stay (SKILLED_NURSING_FACILITY): Payer: Medicare Other | Admitting: Nurse Practitioner

## 2013-01-16 DIAGNOSIS — E119 Type 2 diabetes mellitus without complications: Secondary | ICD-10-CM | POA: Diagnosis not present

## 2013-01-16 DIAGNOSIS — I10 Essential (primary) hypertension: Secondary | ICD-10-CM | POA: Diagnosis not present

## 2013-01-16 DIAGNOSIS — G250 Essential tremor: Secondary | ICD-10-CM | POA: Diagnosis not present

## 2013-01-16 DIAGNOSIS — F0390 Unspecified dementia without behavioral disturbance: Secondary | ICD-10-CM | POA: Diagnosis not present

## 2013-01-16 DIAGNOSIS — F329 Major depressive disorder, single episode, unspecified: Secondary | ICD-10-CM

## 2013-01-16 NOTE — Progress Notes (Signed)
Patient ID: Debra Tran, female   DOB: 20-Dec-1927, 79 y.o.   MRN: [id number]  No Known Allergies  Chief Complaint  Patient presents with  . Medical Managment of Chronic Issues    HPI:  79 yo female with h/o paranoid schizophrenia, depression, dementia thought to also be due to ECT in the past, allergic rhinitis, DMII on metformin and lantus 10 units, GERD, hyperlipidemia was seen for medical management of her chronic diseases Pt with tremors and she was recently placed on primidone 25 mg; this has helped but pt reports she still has trouble with task; nursing does not have any concerns Review of Systems:  Review of Systems  Constitutional: Negative for fever and chills.  HENT: Negative for congestion.   Eyes: Negative for blurred vision.  Respiratory: Negative for shortness of breath.   Cardiovascular: Negative for chest pain.  Gastrointestinal: Negative for abdominal pain.  Genitourinary: Negative for dysuria.  Musculoskeletal: Positive for joint pain. Negative for falls.  Neurological: Positive for tremors. Negative for headaches.  Psychiatric/Behavioral: Positive for depression and memory loss. The patient is nervous/anxious.      Past Medical History  Diagnosis Date  . Phobia   . Macular degeneration   . Narcissism   . Schizophrenia, paranoid   . Fungal dermatitis   . Normocytic anemia   . Hypertension   . Hyperlipidemia   . Mild cognitive impairment   . Diabetes mellitus type 2 in obese   . Stress incontinence, female   . Depressive disorder   . Allergic rhinitis    Past Surgical History  Procedure Laterality Date  . Cataract extraction  6 16 2006  . Cryotherapy  2 15 2006    facial AK  . Total abdominal hysterectomy w/ bilateral salpingoophorectomy      due to endometriosis  non malignant  . Hyperplastic   4 20 2006    AK shave biopsy  . Abdominal hysterectomy      due to fibroids   Social History:   reports that she has quit smoking. She does not have  any smokeless tobacco history on file. She reports that she does not drink alcohol. Her drug history is not on file.  Family History  Problem Relation Age of Onset  . Heart disease Mother   . Stroke Mother   . Heart disease Father   . Stroke Father   . Cancer Cousin     liver and colon    Medications: Patient's Medications  New Prescriptions   No medications on file  Previous Medications   ASPIRIN EC 81 MG TABLET    Take 81 mg by mouth every morning.    ATORVASTATIN (LIPITOR) 20 MG TABLET    Take 1 tablet (20 mg total) by mouth at bedtime.   CALCIUM CARBONATE-VITAMIN D (CALCIUM 600-D) 600-400 MG-UNIT PER TABLET    Take 1 tablet by mouth every morning.    CLOTRIMAZOLE (LOTRIMIN) 1 % CREAM    Apply 1 application topically 2 (two) times daily.   DOCUSATE SODIUM (COLACE) 100 MG CAPSULE    Take 100 mg by mouth 2 (two) times daily.   DONEPEZIL (ARICEPT) 5 MG TABLET    Take 10 mg by mouth at bedtime.    ESCITALOPRAM (LEXAPRO) 10 MG TABLET    Take 20 mg by mouth at bedtime.    FLUTICASONE (FLONASE) 50 MCG/ACT NASAL SPRAY    Place 2 sprays into the nose every morning.    FOOT CARE PRODUCTS (CORN CUSHIONS) PADS  1 Device by Does not apply route daily.   INSULIN GLARGINE (LANTUS) 100 UNIT/ML INJECTION    Inject 10 Units into the skin at bedtime.   KETOCONAZOLE (NIZORAL) 2 % CREAM    Apply 1 application topically 2 (two) times daily. Apply to groin and buttocks 2 times a day   METFORMIN (GLUCOPHAGE) 500 MG TABLET    Take 250 mg by mouth 2 (two) times daily with a meal.   MULTIPLE VITAMIN (MULTIVITAMIN WITH MINERALS) TABS    Take 1 tablet by mouth every morning.   NORTRIPTYLINE (PAMELOR) 75 MG CAPSULE    Take 75 mg by mouth at bedtime.   OLANZAPINE (ZYPREXA) 5 MG TABLET    Take 1 tablet (5 mg total) by mouth at bedtime.   OMEPRAZOLE (PRILOSEC) 20 MG CAPSULE    Take 1 capsule (20 mg total) by mouth 2 (two) times daily.   PREDNISOLONE ACETATE (PRED FORTE) 1 % OPHTHALMIC SUSPENSION    1 drop 4  (four) times daily.   PRIMIDONE (MYSOLINE) 50 MG TABLET    Take 25 mg by mouth at bedtime.   VITAMIN C (ASCORBIC ACID) 500 MG TABLET    Take 500 mg by mouth daily.  Modified Medications   No medications on file  Discontinued Medications   No medications on file     Physical Exam: Physical Exam  Constitutional: She is well-developed, well-nourished, and in no distress. No distress.  HENT:  Head: Normocephalic and atraumatic.  Mouth/Throat: Oropharynx is clear and moist. No oropharyngeal exudate.  Eyes: Conjunctivae and EOM are normal. Pupils are equal, round, and reactive to light.  Neck: Normal range of motion. Neck supple. No thyromegaly present.  Cardiovascular: Normal rate, regular rhythm and normal heart sounds.   Pulmonary/Chest: Effort normal and breath sounds normal. No respiratory distress.  Abdominal: Soft. Bowel sounds are normal. She exhibits no distension.  Musculoskeletal: She exhibits no edema and no tenderness.  Lymphadenopathy:    She has no cervical adenopathy.  Neurological: She is alert.  Skin: Skin is warm and dry. She is not diaphoretic.     Filed Vitals:   01/16/13 1302  BP: 128/67  Pulse: 68  Temp: 98.8 F (37.1 C)  Resp: 20      Labs reviewed: Basic Metabolic Panel:  Recent Labs  16/10/96 1714 07/13/12 0017  NA 133* 137  K 3.7 3.3*  CL 94* 98  CO2 26 23  GLUCOSE 155* 61*  BUN 22 35*  CREATININE 0.81 1.09  CALCIUM 9.1 9.2   Liver Function Tests:  Recent Labs  05/31/12 1714 07/13/12 0017  AST 19 25  ALT 16 16  ALKPHOS 72 72  BILITOT 0.4 0.4  PROT 7.3 6.8  ALBUMIN 3.6 3.5   No results found for this basename: LIPASE, AMYLASE,  in the last 8760 hours No results found for this basename: AMMONIA,  in the last 8760 hours CBC:  Recent Labs  05/31/12 1714 07/13/12 0017  WBC 7.0 8.1  NEUTROABS  --  5.2  HGB 11.2* 11.8*  HCT 34.4* 36.2  MCV 80.9 81.3  PLT 234 272   Cardiac Enzymes: No results found for this basename:  CKTOTAL, CKMB, CKMBINDEX, TROPONINI,  in the last 8760 hours BNP: No components found with this basename: POCBNP,  CBG:  Recent Labs  07/14/12 0500 07/14/12 0726 07/14/12 0911  GLUCAP 69* 75 102*   Hemoglobin A1C    Result: 10/06/2012 11:55 AM   ( Status: F )  C Hemoglobin A1C 6.6   H <5.7 % SLN C Estimated Average Glucose 143 12/15/12 sodium 144, potassium 4.1, glucose 89, BUN 0.93, Cr 0.93 Vit D 43  Uric Acid    Result: 12/20/2012 6:20 PM   ( Status: F )       Uric Acid 6.0  Assessment/Plan 1. HYPERTENSION, BENIGN SYSTEMIC Patient is stable; continue current regimen. Will monitor and make changes as necessary.  2. Dementia Currently stable; functions well in nursing home setting. Will start namenda titration to namenda 10 mg BID   3. DIABETES MELLITUS II, UNCOMPLICATED Patient is stable; continue current regimen. Will monitor and make changes as necessary.  4. DEPRESSIVE DISORDER, NOS Reports sadness at time but overall mood has been good. Will cont current medications.  5. Essential tremor Pt reports this has improved but still has trouble with actions such as turing on the tv; will increase primidone dose to 50 mg to see if there is any additional therapeutic effect.  May could titrate Zyprexa off if mood allows once namenda is theraputic to see if this would help with tremor

## 2013-01-20 DIAGNOSIS — M6281 Muscle weakness (generalized): Secondary | ICD-10-CM | POA: Diagnosis not present

## 2013-01-20 DIAGNOSIS — E119 Type 2 diabetes mellitus without complications: Secondary | ICD-10-CM | POA: Diagnosis not present

## 2013-01-20 DIAGNOSIS — G3184 Mild cognitive impairment, so stated: Secondary | ICD-10-CM | POA: Diagnosis not present

## 2013-01-20 DIAGNOSIS — F0391 Unspecified dementia with behavioral disturbance: Secondary | ICD-10-CM | POA: Diagnosis not present

## 2013-01-20 DIAGNOSIS — E785 Hyperlipidemia, unspecified: Secondary | ICD-10-CM | POA: Diagnosis not present

## 2013-01-20 DIAGNOSIS — F329 Major depressive disorder, single episode, unspecified: Secondary | ICD-10-CM | POA: Diagnosis not present

## 2013-01-20 DIAGNOSIS — F29 Unspecified psychosis not due to a substance or known physiological condition: Secondary | ICD-10-CM | POA: Diagnosis not present

## 2013-01-20 DIAGNOSIS — F259 Schizoaffective disorder, unspecified: Secondary | ICD-10-CM | POA: Diagnosis not present

## 2013-01-20 DIAGNOSIS — D649 Anemia, unspecified: Secondary | ICD-10-CM | POA: Diagnosis not present

## 2013-01-20 DIAGNOSIS — F0280 Dementia in other diseases classified elsewhere without behavioral disturbance: Secondary | ICD-10-CM | POA: Diagnosis not present

## 2013-01-20 DIAGNOSIS — I10 Essential (primary) hypertension: Secondary | ICD-10-CM | POA: Diagnosis not present

## 2013-01-20 DIAGNOSIS — IMO0001 Reserved for inherently not codable concepts without codable children: Secondary | ICD-10-CM | POA: Diagnosis not present

## 2013-01-20 DIAGNOSIS — M159 Polyosteoarthritis, unspecified: Secondary | ICD-10-CM | POA: Diagnosis not present

## 2013-01-27 ENCOUNTER — Non-Acute Institutional Stay (SKILLED_NURSING_FACILITY): Payer: Medicare Other | Admitting: Internal Medicine

## 2013-01-27 ENCOUNTER — Encounter: Payer: Self-pay | Admitting: Internal Medicine

## 2013-01-27 DIAGNOSIS — R21 Rash and other nonspecific skin eruption: Secondary | ICD-10-CM

## 2013-01-27 NOTE — Progress Notes (Signed)
MRN: [id number] Name: Debra Tran  Sex: female Age: 79 y.o. DOB: 1928-03-30  PSC #: Ronni Rumble Facility/Room: 214A Level Of Care: SNF Provider: Merrilee Seashore D Emergency Contacts: Extended Emergency Contact Information Primary Emergency Contact: Hymon,[name] Address: [address]          [place] Home Phone: [phone] Relation: Daughter Secondary Emergency Contact: Michaell [place] Home Phone: [phone] Relation: Other  Code Status:   Allergies: Review of patient's allergies indicates no known allergies.  Chief Complaint  Patient presents with  . Acute Visit    HPI: Patient is 79 y.o. female whose daughter asked me to look at the rash on her face.   Past Medical History  Diagnosis Date  . Phobia   . Macular degeneration   . Narcissism   . Schizophrenia, paranoid   . Fungal dermatitis   . Normocytic anemia   . Hypertension   . Hyperlipidemia   . Mild cognitive impairment   . Diabetes mellitus type 2 in obese   . Stress incontinence, female   . Depressive disorder   . Allergic rhinitis     Past Surgical History  Procedure Laterality Date  . Cataract extraction  6 16 2006  . Cryotherapy  2 15 2006    facial AK  . Total abdominal hysterectomy w/ bilateral salpingoophorectomy      due to endometriosis  non malignant  . Hyperplastic   4 20 2006    AK shave biopsy  . Abdominal hysterectomy      due to fibroids      Medication List       This list is accurate as of: 01/27/13  8:06 PM.  Always use your most recent med list.               aspirin EC 81 MG tablet  Take 81 mg by mouth every morning.     atorvastatin 20 MG tablet  Commonly known as:  LIPITOR  Take 1 tablet (20 mg total) by mouth at bedtime.     CALCIUM 600-D 600-400 MG-UNIT per tablet  Generic drug:  Calcium Carbonate-Vitamin D  Take 1 tablet by mouth every morning.     clotrimazole 1 % cream  Commonly known  as:  LOTRIMIN  Apply 1 application topically 2 (two) times daily.     Corn Cushions Pads  1 Device by Does not apply route daily.     docusate sodium 100 MG capsule  Commonly known as:  COLACE  Take 100 mg by mouth 2 (two) times daily.     donepezil 5 MG tablet  Commonly known as:  ARICEPT  Take 10 mg by mouth at bedtime.     escitalopram 10 MG tablet  Commonly known as:  LEXAPRO  Take 20 mg by mouth at bedtime.     fluticasone 50 MCG/ACT nasal spray  Commonly known as:  FLONASE  Place 2 sprays into the nose every morning.     insulin glargine 100 UNIT/ML injection  Commonly known as:  LANTUS  Inject 10 Units into the skin at bedtime.     ketoconazole 2 % cream  Commonly known as:  NIZORAL  Apply 1 application topically 2 (two) times daily. Apply to groin and buttocks 2 times a day     metFORMIN 500 MG tablet  Commonly known as:  GLUCOPHAGE  Take 250 mg by mouth 2 (two) times daily with a meal.  multivitamin with minerals Tabs tablet  Take 1 tablet by mouth every morning.     nortriptyline 75 MG capsule  Commonly known as:  PAMELOR  Take 75 mg by mouth at bedtime.     OLANZapine 5 MG tablet  Commonly known as:  ZYPREXA  Take 1 tablet (5 mg total) by mouth at bedtime.     omeprazole 20 MG capsule  Commonly known as:  PRILOSEC  Take 1 capsule (20 mg total) by mouth 2 (two) times daily.     prednisoLONE acetate 1 % ophthalmic suspension  Commonly known as:  PRED FORTE  1 drop 4 (four) times daily.     primidone 50 MG tablet  Commonly known as:  MYSOLINE  Take 25 mg by mouth at bedtime.     vitamin C 500 MG tablet  Commonly known as:  ASCORBIC ACID  Take 500 mg by mouth daily.        No orders of the defined types were placed in this encounter.    Immunization History  Administered Date(s) Administered  . Influenza Split 02/15/2011, 01/21/2012  . Influenza Whole 02/05/2007, 01/17/2010  . Pneumococcal Polysaccharide 04/10/1999  . Td 10/07/2001     History  Substance Use Topics  . Smoking status: Former Games developer  . Smokeless tobacco: Not on file  . Alcohol Use: No    Review of Systems  DATA OBTAINED: from patient GENERAL: Feels well no fevers, fatigue, appetite changes SKIN: No itching;C/O IT FEELS FLAKEY HEENT: No complaint RESPIRATORY: No cough, wheezing, SOB CARDIAC: No chest pain, palpitations, lower extremity edema  GI: No abdominal pain, No N/V/D or constipation, No heartburn or reflux  GU: No dysuria, frequency or urgency, or incontinence  MUSCULOSKELETAL: No unrelieved bone/joint pain NEUROLOGIC: No headache, dizziness or focal weakness PSYCHIATRIC: No overt anxiety or sadness. Sleeps well.   Filed Vitals:   01/27/13 1957  BP: 143/78  Pulse: 70  Temp: 98.7 F (37.1 C)  Resp: 19    Physical Exam  GENERAL APPEARANCE: Alert, minimally conversant. Appropriately groomed. No acute distress  SKIN: pale delicate skin, mildly dry skin, roundish in shape overlying tiny telangiectasias HEENT: Unremarkable RESPIRATORY: Breathing is even, unlabored. Lung sounds are clear   CARDIOVASCULAR: Heart RRR no murmurs, rubs or gallops. No peripheral edema  GASTROINTESTINAL: Abdomen is soft, non-tender, not distended w/ normal bowel sounds.  GENITOURINARY: Bladder non tender, not distended  MUSCULOSKELETAL: No abnormal joints or musculature NEUROLOGIC: Cranial nerves 2-12 grossly intact. Moves all extremities no tremor. PSYCHIATRIC: Dementia affect, no behavioral issues  Patient Active Problem List   Diagnosis Date Noted  . Tinea corporis 10/15/2012  . Arch pain of left foot 10/15/2012  . Right ankle pain 03/04/2012  . Dementia 12/12/2011  . Weight loss 08/31/2011  . Vaginal odor 08/31/2011  . Repeated falls 03/12/2011  . Acid indigestion 06/15/2010  . FUNGAL DERMATITIS 03/14/2010  . ANEMIA, NORMOCYTIC 10/13/2009  . Mild dementia 10/13/2009  . DIZZINESS 10/13/2009  . GAIT DISTURBANCE 10/13/2009  . CORNS AND  CALLOSITIES 09/20/2009  . ARTHRITIS 09/20/2009  . DIABETES MELLITUS II, UNCOMPLICATED 06/06/2006  . HYPERLIPIDEMIA 06/06/2006  . OBESITY, NOS 06/06/2006  . SCHIZOPHRENIA 06/06/2006  . DEPRESSIVE DISORDER, NOS 06/06/2006  . CATARACT 06/06/2006  . HYPERTENSION, BENIGN SYSTEMIC 06/06/2006  . RHINITIS, ALLERGIC 06/06/2006  . INCONTINENCE, STRESS, FEMALE 06/06/2006        Assessment and Plan  FACIAL RASH- will start with eucerin cream BID; skin is too delicate for hydrocortisone;will monitor;if no improvement will consider  dermatologist  Margit Hanks, MD

## 2013-01-28 ENCOUNTER — Telehealth: Payer: Self-pay | Admitting: *Deleted

## 2013-01-28 DIAGNOSIS — F259 Schizoaffective disorder, unspecified: Secondary | ICD-10-CM | POA: Diagnosis not present

## 2013-01-28 DIAGNOSIS — G3184 Mild cognitive impairment, so stated: Secondary | ICD-10-CM | POA: Diagnosis not present

## 2013-01-28 DIAGNOSIS — IMO0001 Reserved for inherently not codable concepts without codable children: Secondary | ICD-10-CM | POA: Diagnosis not present

## 2013-01-28 DIAGNOSIS — F0280 Dementia in other diseases classified elsewhere without behavioral disturbance: Secondary | ICD-10-CM | POA: Diagnosis not present

## 2013-01-28 DIAGNOSIS — I10 Essential (primary) hypertension: Secondary | ICD-10-CM | POA: Diagnosis not present

## 2013-01-28 DIAGNOSIS — Z23 Encounter for immunization: Secondary | ICD-10-CM | POA: Diagnosis not present

## 2013-01-28 NOTE — Telephone Encounter (Signed)
Status of diabetic shoes.  Please call pt's dtr [name] [phone].

## 2013-01-29 DIAGNOSIS — G3184 Mild cognitive impairment, so stated: Secondary | ICD-10-CM | POA: Diagnosis not present

## 2013-01-29 DIAGNOSIS — IMO0001 Reserved for inherently not codable concepts without codable children: Secondary | ICD-10-CM | POA: Diagnosis not present

## 2013-01-29 DIAGNOSIS — F259 Schizoaffective disorder, unspecified: Secondary | ICD-10-CM | POA: Diagnosis not present

## 2013-01-29 DIAGNOSIS — F0280 Dementia in other diseases classified elsewhere without behavioral disturbance: Secondary | ICD-10-CM | POA: Diagnosis not present

## 2013-01-29 DIAGNOSIS — I10 Essential (primary) hypertension: Secondary | ICD-10-CM | POA: Diagnosis not present

## 2013-01-30 DIAGNOSIS — F0280 Dementia in other diseases classified elsewhere without behavioral disturbance: Secondary | ICD-10-CM | POA: Diagnosis not present

## 2013-01-30 DIAGNOSIS — I10 Essential (primary) hypertension: Secondary | ICD-10-CM | POA: Diagnosis not present

## 2013-01-30 DIAGNOSIS — IMO0001 Reserved for inherently not codable concepts without codable children: Secondary | ICD-10-CM | POA: Diagnosis not present

## 2013-01-30 DIAGNOSIS — F259 Schizoaffective disorder, unspecified: Secondary | ICD-10-CM | POA: Diagnosis not present

## 2013-01-30 DIAGNOSIS — G3184 Mild cognitive impairment, so stated: Secondary | ICD-10-CM | POA: Diagnosis not present

## 2013-01-30 NOTE — Telephone Encounter (Signed)
Spoke to pt's daughter, [name]. I told her I have yet to receive authorization back from Dr. Renato Gails. [name] said that she would call the facility and check into this and call me back!

## 2013-01-30 NOTE — Telephone Encounter (Signed)
Per message from [name]: requested that we fax paperwork to facility at [phone] to [facility]. She will have Dr. Lyn Hollingshead sign and fax to [facility].  I did fax to [facility] and will wait for response.

## 2013-02-02 DIAGNOSIS — F259 Schizoaffective disorder, unspecified: Secondary | ICD-10-CM | POA: Diagnosis not present

## 2013-02-02 DIAGNOSIS — F0280 Dementia in other diseases classified elsewhere without behavioral disturbance: Secondary | ICD-10-CM | POA: Diagnosis not present

## 2013-02-02 DIAGNOSIS — IMO0001 Reserved for inherently not codable concepts without codable children: Secondary | ICD-10-CM | POA: Diagnosis not present

## 2013-02-02 DIAGNOSIS — G3184 Mild cognitive impairment, so stated: Secondary | ICD-10-CM | POA: Diagnosis not present

## 2013-02-02 DIAGNOSIS — I10 Essential (primary) hypertension: Secondary | ICD-10-CM | POA: Diagnosis not present

## 2013-02-03 ENCOUNTER — Ambulatory Visit (INDEPENDENT_AMBULATORY_CARE_PROVIDER_SITE_OTHER): Payer: Medicare Other

## 2013-02-03 VITALS — BP 219/113 | HR 86 | Resp 16 | Ht 60.0 in | Wt 154.0 lb

## 2013-02-03 DIAGNOSIS — G3184 Mild cognitive impairment, so stated: Secondary | ICD-10-CM | POA: Diagnosis not present

## 2013-02-03 DIAGNOSIS — L608 Other nail disorders: Secondary | ICD-10-CM | POA: Diagnosis not present

## 2013-02-03 DIAGNOSIS — E1140 Type 2 diabetes mellitus with diabetic neuropathy, unspecified: Secondary | ICD-10-CM

## 2013-02-03 DIAGNOSIS — E1149 Type 2 diabetes mellitus with other diabetic neurological complication: Secondary | ICD-10-CM

## 2013-02-03 DIAGNOSIS — F259 Schizoaffective disorder, unspecified: Secondary | ICD-10-CM | POA: Diagnosis not present

## 2013-02-03 DIAGNOSIS — F0280 Dementia in other diseases classified elsewhere without behavioral disturbance: Secondary | ICD-10-CM | POA: Diagnosis not present

## 2013-02-03 DIAGNOSIS — E1142 Type 2 diabetes mellitus with diabetic polyneuropathy: Secondary | ICD-10-CM

## 2013-02-03 DIAGNOSIS — I10 Essential (primary) hypertension: Secondary | ICD-10-CM | POA: Diagnosis not present

## 2013-02-03 DIAGNOSIS — IMO0001 Reserved for inherently not codable concepts without codable children: Secondary | ICD-10-CM | POA: Diagnosis not present

## 2013-02-03 NOTE — Patient Instructions (Signed)

## 2013-02-03 NOTE — Progress Notes (Signed)
  Subjective:    Patient ID: Debra Tran, female    DOB: July 13, 1927, 79 y.o.   MRN: [id number] "Cut those toenails and check those corns," stated patient's daughter. Nails thick discolored and yellowed and crumbly/friable, tender on palpation and with enclosed shoe wear.  HPI no changes medication her health history at this time Patient does have a history of diabetes, type II with peripheral neuropathy.   Review of Systems deferred at this     Objective:   Physical Exam  Vitals reviewed. Constitutional: She appears well-developed and well-nourished.  Cardiovascular:  Pulses:      Dorsalis pedis pulses are 1+ on the right side, and 1+ on the left side.       Posterior tibial pulses are 0 on the right side, and 0 on the left side.  Capillary refill timed 3 seconds all digits bilateral. Skin temperature warm. Turgor normal no edema rubor noted mild varicosities noted bilateral.  Musculoskeletal:  Notable HAV deformity noted bilateral with mild flexible digital contractures 2 through 5 bilateral.  Neurological: She is alert. She has normal strength.  Epicritic sensation diminished on Semmes Weinstein testing to forefoot and digits bilateral. There is normal plantar response. DTRs not listed bilateral.  Skin: Skin is warm and dry. No cyanosis. Nails show no clubbing.  Skin color and pigment normal. Hair growth absent bilateral. Nails friable brittle discolored crumbly and tender on palpation and with enclosed shoe wear and ambulation .  Psychiatric: She has a normal mood and affect. Her behavior is normal.          Assessment & Plan:  Diabetes with history peripheral neuropathy and complicated factors. Patient is ambulating with diabetic extra-depth shoes. Is also transported with wheelchair. Nails thick brittle discolored and friable debrided x10 in the presence of diabetes and complications. Recommend followup and palliative care in 3 months maintain diabetic shoes as instructed.  No changes in health or history noted at this time  Alvan Dame DP

## 2013-02-04 ENCOUNTER — Encounter (INDEPENDENT_AMBULATORY_CARE_PROVIDER_SITE_OTHER): Payer: Medicare Other | Admitting: Ophthalmology

## 2013-02-04 DIAGNOSIS — H35039 Hypertensive retinopathy, unspecified eye: Secondary | ICD-10-CM

## 2013-02-04 DIAGNOSIS — H3509 Other intraretinal microvascular abnormalities: Secondary | ICD-10-CM

## 2013-02-04 DIAGNOSIS — I10 Essential (primary) hypertension: Secondary | ICD-10-CM | POA: Diagnosis not present

## 2013-02-04 DIAGNOSIS — H3530 Unspecified macular degeneration: Secondary | ICD-10-CM | POA: Diagnosis not present

## 2013-02-04 DIAGNOSIS — H43819 Vitreous degeneration, unspecified eye: Secondary | ICD-10-CM

## 2013-02-04 NOTE — Telephone Encounter (Signed)
Received authorization for diabetic shoes. I ordered pt's shoes and insoles. Will contact pt's daughter once they arrive.

## 2013-02-10 DIAGNOSIS — H02829 Cysts of unspecified eye, unspecified eyelid: Secondary | ICD-10-CM | POA: Diagnosis not present

## 2013-02-10 DIAGNOSIS — H3530 Unspecified macular degeneration: Secondary | ICD-10-CM | POA: Diagnosis not present

## 2013-02-10 DIAGNOSIS — H26499 Other secondary cataract, unspecified eye: Secondary | ICD-10-CM | POA: Diagnosis not present

## 2013-02-12 ENCOUNTER — Other Ambulatory Visit: Payer: Self-pay

## 2013-02-18 NOTE — Progress Notes (Signed)
Patient ID: Debra Tran, female   DOB: 06-06-1927, 79 y.o.   MRN: [id number]  [facility]  Allergies  Allergen Reactions  . Ether Nausea And Vomiting    Chief Complaint  Patient presents with  . Medical Managment of Chronic Issues    HPI  She is being seen for the management of her chronic illnesses. Overall her status is without recent change. There are no concerns being voiced by the nursing staff at this time. She is not voicing any concerns at this time.  Past Medical History  Diagnosis Date  . Phobia   . Macular degeneration   . Narcissism   . Schizophrenia, paranoid   . Fungal dermatitis   . Normocytic anemia   . Hypertension   . Hyperlipidemia   . Mild cognitive impairment   . Diabetes mellitus type 2 in obese   . Stress incontinence, female   . Depressive disorder   . Allergic rhinitis     Past Surgical History  Procedure Laterality Date  . Cataract extraction  6 16 2006  . Cryotherapy  2 15 2006    facial AK  . Total abdominal hysterectomy w/ bilateral salpingoophorectomy      due to endometriosis  non malignant  . Hyperplastic   4 20 2006    AK shave biopsy  . Abdominal hysterectomy      due to fibroids    Filed Vitals:   09/23/12 1042  BP: 124/72  Pulse: 74  Height: 5' (1.524 m)  Weight: 148 lb (67.132 kg)   MEDICATIONS:   novolog 5 units prior to meals for cbg >=150 zyprexa 20 mg nightly prilosec 20 mg daily Vit c twice daily pamelor 25 mg in the am and 75 mg in the pm aricept 5 mg nightly Asa 81 mg daily Ca++ 00/200 daily colace twice daily flonase daily lexapro 20 mg daily lipitor 20 mg daily Megace 400 mg daily  mvi daily   LABS REVIEWED:   08-21-12; wbc 8.5; hgb 10.6; hct 31.7; mcv 81.3; plt 311; glucose 113; bun 20; creat 0.74; k+3.7; na++140; liver normal albumin 3.1; uric acid 5.0; BNP 63.6     Review of Systems  Constitutional: Positive for malaise/fatigue. Negative for fever and chills.  HENT: Negative for hearing  loss.   Eyes: Negative for blurred vision.  Respiratory: Negative for shortness of breath.   Cardiovascular: Negative for chest pain.  Gastrointestinal: Negative for abdominal pain.  Musculoskeletal: Positive for joint pain  Skin: Negative for rash.  Neurological: Positive for weakness. Negative for dizziness.  Psychiatric/Behavioral: Positive for depression     Physical Exam  Constitutional:  Obese female  HENT:  Head: Normocephalic and atraumatic.   Mouth/Throat: Oropharynx is clear and moist. No oropharyngeal exudate.  Eyes: EOM are normal. Pupils are equal, round, and reactive to light. No scleral icterus.  Neck: Normal range of motion. Neck supple. No JVD present. No tracheal deviation present. No thyromegaly present.  Cardiovascular: Normal rate, regular rhythm, normal heart sounds and intact distal pulses.   Pulmonary/Chest: Effort normal and breath sounds normal.  Abdominal: Soft. Bowel sounds are normal. She exhibits no distension and no mass. There is no tenderness.  Musculoskeletal: Normal range of motion. She exhibits no edema and no tenderness.  Neurological: She is alert. No cranial nerve deficit.  Oriented to place and person  Skin: Skin is warm and dry.  Psychiatric:  Flat affect   ASSESSMENT/PLAN  1. Dyslipidemia: will continue lipitor 20 mg daily  2.  Hypertension: presently not on medications and is stable will monitor her status will continue asa 81 mg daily   3. Dementia: without change in status; will continue aricept 5 mg nightly  4. Diabetes: will continue novolog 5 units prior ot meals for cbg >=150  5. Genella Rife: will continue prilosec 20 mg daily  6. Rhinitis: will continue her flonase daily and will monitor   7.  Schizophrenia; she is emotionally stable will continue her current regimen of zyprexa 20 mg nightly; pamelor 25 mg in the am and 75 mg in the pm; lexapro 20 mg daily for depression and will monitor her status

## 2013-02-26 DIAGNOSIS — M25569 Pain in unspecified knee: Secondary | ICD-10-CM | POA: Diagnosis not present

## 2013-02-27 ENCOUNTER — Non-Acute Institutional Stay (SKILLED_NURSING_FACILITY): Payer: Medicare Other | Admitting: Internal Medicine

## 2013-02-27 ENCOUNTER — Encounter: Payer: Self-pay | Admitting: Internal Medicine

## 2013-02-27 DIAGNOSIS — H6123 Impacted cerumen, bilateral: Secondary | ICD-10-CM

## 2013-02-27 DIAGNOSIS — E119 Type 2 diabetes mellitus without complications: Secondary | ICD-10-CM | POA: Diagnosis not present

## 2013-02-27 DIAGNOSIS — H6120 Impacted cerumen, unspecified ear: Secondary | ICD-10-CM

## 2013-02-27 DIAGNOSIS — I10 Essential (primary) hypertension: Secondary | ICD-10-CM | POA: Diagnosis not present

## 2013-02-27 DIAGNOSIS — F3289 Other specified depressive episodes: Secondary | ICD-10-CM

## 2013-02-27 DIAGNOSIS — R296 Repeated falls: Secondary | ICD-10-CM

## 2013-02-27 DIAGNOSIS — E785 Hyperlipidemia, unspecified: Secondary | ICD-10-CM | POA: Diagnosis not present

## 2013-02-27 DIAGNOSIS — R29818 Other symptoms and signs involving the nervous system: Secondary | ICD-10-CM

## 2013-02-27 DIAGNOSIS — D649 Anemia, unspecified: Secondary | ICD-10-CM

## 2013-02-27 DIAGNOSIS — F329 Major depressive disorder, single episode, unspecified: Secondary | ICD-10-CM | POA: Insufficient documentation

## 2013-02-27 NOTE — Progress Notes (Signed)
MRN: [id number] Name: Debra Tran  Sex: female Age: 79 y.o. DOB: 07-Nov-1927  PSC #: Ronni Rumble Facility/Room: 214A Level Of Care: SNF Provider: Merrilee Seashore D Emergency Contacts: Extended Emergency Contact Information Primary Emergency Contact: Madan,[name] Address: [address]          [place] Home Phone: [phone] Relation: Daughter Secondary Emergency Contact: Michaell [place] Home Phone: [phone] Relation: Other  Code Status:   Allergies: Ether  Chief Complaint  Patient presents with  . Medical Managment of Chronic Issues    HPI: Patient is 79 y.o. female who is being seen chronic issues and several semiacute issues.  Past Medical History  Diagnosis Date  . Phobia   . Macular degeneration   . Narcissism   . Schizophrenia, paranoid   . Fungal dermatitis   . Hyperlipidemia   . Mild cognitive impairment   . Diabetes mellitus type 2 in obese   . Stress incontinence, female   . Allergic rhinitis   . Normocytic anemia   . Depressive disorder   . Hypertension     Past Surgical History  Procedure Laterality Date  . Cataract extraction  6 16 2006  . Cryotherapy  2 15 2006    facial AK  . Total abdominal hysterectomy w/ bilateral salpingoophorectomy      due to endometriosis  non malignant  . Hyperplastic   4 20 2006    AK shave biopsy  . Abdominal hysterectomy      due to fibroids      Medication List       This list is accurate as of: 02/27/13 11:59 PM.  Always use your most recent med list.               aspirin EC 81 MG tablet  Take 81 mg by mouth every morning.     atorvastatin 20 MG tablet  Commonly known as:  LIPITOR  Take 1 tablet (20 mg total) by mouth at bedtime.     CALCIUM 600-D 600-400 MG-UNIT per tablet  Generic drug:  Calcium Carbonate-Vitamin D  Take 1 tablet by mouth every morning.     clotrimazole 1 % cream  Commonly known as:  LOTRIMIN  Apply 1  application topically 2 (two) times daily.     Corn Cushions Pads  1 Device by Does not apply route daily.     docusate sodium 100 MG capsule  Commonly known as:  COLACE  Take 100 mg by mouth 2 (two) times daily.     donepezil 5 MG tablet  Commonly known as:  ARICEPT  Take 10 mg by mouth at bedtime.     escitalopram 10 MG tablet  Commonly known as:  LEXAPRO  Take 20 mg by mouth at bedtime.     fluticasone 50 MCG/ACT nasal spray  Commonly known as:  FLONASE  Place 2 sprays into the nose every morning.     insulin glargine 100 UNIT/ML injection  Commonly known as:  LANTUS  Inject 10 Units into the skin at bedtime.     ketoconazole 2 % cream  Commonly known as:  NIZORAL  Apply 1 application topically 2 (two) times daily. Apply to groin and buttocks 2 times a day     metFORMIN 500 MG tablet  Commonly known as:  GLUCOPHAGE  Take 250 mg by mouth 2 (two) times daily with a meal.     multivitamin with minerals Tabs tablet  Take 1  tablet by mouth every morning.     nortriptyline 75 MG capsule  Commonly known as:  PAMELOR  Take 75 mg by mouth at bedtime.     OLANZapine 5 MG tablet  Commonly known as:  ZYPREXA  Take 1 tablet (5 mg total) by mouth at bedtime.     omeprazole 20 MG capsule  Commonly known as:  PRILOSEC  Take 1 capsule (20 mg total) by mouth 2 (two) times daily.     prednisoLONE acetate 1 % ophthalmic suspension  Commonly known as:  PRED FORTE  1 drop 4 (four) times daily.     primidone 50 MG tablet  Commonly known as:  MYSOLINE  Take 25 mg by mouth at bedtime.     vitamin C 500 MG tablet  Commonly known as:  ASCORBIC ACID  Take 500 mg by mouth daily.        No orders of the defined types were placed in this encounter.    Immunization History  Administered Date(s) Administered  . Influenza Split 02/15/2011, 01/21/2012  . Influenza Whole 02/05/2007, 01/17/2010, 01/28/2013  . Pneumococcal Polysaccharide-23 04/10/1999  . Td 10/07/2001     History  Substance Use Topics  . Smoking status: Former Games developer  . Smokeless tobacco: Not on file  . Alcohol Use: No    Review of Systems  DATA OBTAINED: from patient, nurse; NURSE ASKED ME TO LOOK AT PT'S EARS AND KNEE;PT HAD NO C/O AD ADMITS THAT KNEE IS BETTER;PT WITH DEMENTIA GENERAL: Feels well no fevers, fatigue, appetite changes SKIN: No itching, rash HEENT: No complaint RESPIRATORY: No cough, wheezing, SOB CARDIAC: No chest pain, palpitations, lower extremity edema  GI: No abdominal pain, No N/V/D or constipation, No heartburn or reflux  GU: No dysuria, frequency or urgency, or incontinence  MUSCULOSKELETAL: No unrelieved bone/joint pain NEUROLOGIC: No headache, dizziness or focal weakness PSYCHIATRIC: No overt sadness   Filed Vitals:   02/27/13 1338  BP: 123/72  Pulse: 64  Temp: 96 F (35.6 C)  Resp: 18    Physical Exam  GENERAL APPEARANCE: Alert, conversant. Appropriately groomed. No acute distress  SKIN: No diaphoresis rash, or wounds HEENT: Unremarkable; there is no wax in canals;TM's seen and nl RESPIRATORY: Breathing is even, unlabored. Lung sounds are clear   CARDIOVASCULAR: Heart RRR no murmurs, rubs or gallops. No peripheral edema  GASTROINTESTINAL: Abdomen is soft, non-tender, not distended w/ normal bowel sounds.  GENITOURINARY: Bladder non tender, not distended  MUSCULOSKELETAL:no swelling , redness or point tender to knee NEUROLOGIC: Cranial nerves 2-12 grossly intact. Moves all extremities no tremor. PSYCHIATRIC: Dementia, no behavioral issues  Patient Active Problem List   Diagnosis Date Noted  . Normocytic anemia   . Depressive disorder   . Hypertension   . Tinea corporis 10/15/2012  . Arch pain of left foot 10/15/2012  . Right ankle pain 03/04/2012  . Dementia 12/12/2011  . Weight loss 08/31/2011  . Vaginal odor 08/31/2011  . Repeated falls 03/12/2011  . Acid indigestion 06/15/2010  . FUNGAL DERMATITIS 03/14/2010  . ANEMIA,  NORMOCYTIC 10/13/2009  . Mild dementia 10/13/2009  . DIZZINESS 10/13/2009  . GAIT DISTURBANCE 10/13/2009  . CORNS AND CALLOSITIES 09/20/2009  . ARTHRITIS 09/20/2009  . DIABETES MELLITUS II, UNCOMPLICATED 06/06/2006  . HYPERLIPIDEMIA 06/06/2006  . OBESITY, NOS 06/06/2006  . SCHIZOPHRENIA 06/06/2006  . DEPRESSIVE DISORDER, NOS 06/06/2006  . CATARACT 06/06/2006  . HYPERTENSION, BENIGN SYSTEMIC 06/06/2006  . RHINITIS, ALLERGIC 06/06/2006  . INCONTINENCE, STRESS, FEMALE 06/06/2006    CBC  Component Value Date/Time   WBC 8.1 07/13/2012 0017   RBC 4.45 07/13/2012 0017   HGB 11.8* 07/13/2012 0017   HCT 36.2 07/13/2012 0017   PLT 272 07/13/2012 0017   MCV 81.3 07/13/2012 0017   LYMPHSABS 2.3 07/13/2012 0017   MONOABS 0.6 07/13/2012 0017   EOSABS 0.0 07/13/2012 0017   BASOSABS 0.1 07/13/2012 0017    CMP     Component Value Date/Time   NA 137 07/13/2012 0017   K 3.3* 07/13/2012 0017   CL 98 07/13/2012 0017   CO2 23 07/13/2012 0017   GLUCOSE 61* 07/13/2012 0017   BUN 35* 07/13/2012 0017   CREATININE 1.09 07/13/2012 0017   CREATININE 1.00 08/31/2011 1124   CALCIUM 9.2 07/13/2012 0017   PROT 6.8 07/13/2012 0017   ALBUMIN 3.5 07/13/2012 0017   AST 25 07/13/2012 0017   ALT 16 07/13/2012 0017   ALKPHOS 72 07/13/2012 0017   BILITOT 0.4 07/13/2012 0017   GFRNONAA 46* 07/13/2012 0017   GFRAA 53* 07/13/2012 0017   L knee film- NAD-moderate arthritis Vit D - nl   UA- 6.0 Assessment and Plan  DIABETES MELLITUS II, UNCOMPLICATED Pt's last Hba1c was 6.6. Pt has been on a new regimen since July so time to check HBA1c and FLP.  HYPERLIPIDEMIA Last LDL 09/2012 was 78. Need new FLP  HYPERTENSION, BENIGN SYSTEMIC BP is less than goal 150/90; on no medications  ANEMIA, NORMOCYTIC Last CBC 08/2012 10.6/31.7; will re-order a current  DEPRESSIVE DISORDER, NOS Pt on lexapro and pamelor; nurses do not report delusions  Repeated falls Pt had recent fall and hit her knee;Xray showed arthritis only; pt said it was better  WAX  IMPACTIONS- B ears; nurses used debrox and they are completely resolved  Margit Hanks, MD

## 2013-02-27 NOTE — Assessment & Plan Note (Signed)
Pt's last Hba1c was 6.6. Pt has been on a new regimen since July so time to check HBA1c and FLP.

## 2013-02-27 NOTE — Assessment & Plan Note (Signed)
Last LDL 09/2012 was 78. Need new FLP

## 2013-03-01 NOTE — Assessment & Plan Note (Signed)
Pt had recent fall and hit her knee;Xray showed arthritis only; pt said it was better

## 2013-03-01 NOTE — Assessment & Plan Note (Signed)
Pt on lexapro and pamelor; nurses do not report delusions

## 2013-03-01 NOTE — Assessment & Plan Note (Addendum)
Last CBC 08/2012 10.6/31.7; will re-order a current

## 2013-03-01 NOTE — Assessment & Plan Note (Addendum)
BP is less than goal 150/90; on no medications

## 2013-03-10 DIAGNOSIS — F29 Unspecified psychosis not due to a substance or known physiological condition: Secondary | ICD-10-CM | POA: Diagnosis not present

## 2013-03-10 DIAGNOSIS — F329 Major depressive disorder, single episode, unspecified: Secondary | ICD-10-CM | POA: Diagnosis not present

## 2013-03-11 ENCOUNTER — Encounter (INDEPENDENT_AMBULATORY_CARE_PROVIDER_SITE_OTHER): Payer: Medicare Other | Admitting: Ophthalmology

## 2013-03-11 DIAGNOSIS — H35059 Retinal neovascularization, unspecified, unspecified eye: Secondary | ICD-10-CM

## 2013-03-18 DIAGNOSIS — H01009 Unspecified blepharitis unspecified eye, unspecified eyelid: Secondary | ICD-10-CM | POA: Diagnosis not present

## 2013-03-24 ENCOUNTER — Non-Acute Institutional Stay (SKILLED_NURSING_FACILITY): Payer: Medicare Other | Admitting: Internal Medicine

## 2013-03-24 ENCOUNTER — Encounter: Payer: Self-pay | Admitting: Internal Medicine

## 2013-03-24 DIAGNOSIS — J111 Influenza due to unidentified influenza virus with other respiratory manifestations: Secondary | ICD-10-CM

## 2013-03-24 DIAGNOSIS — R21 Rash and other nonspecific skin eruption: Secondary | ICD-10-CM | POA: Diagnosis not present

## 2013-03-24 DIAGNOSIS — J101 Influenza due to other identified influenza virus with other respiratory manifestations: Secondary | ICD-10-CM

## 2013-03-24 DIAGNOSIS — J1189 Influenza due to unidentified influenza virus with other manifestations: Secondary | ICD-10-CM | POA: Diagnosis not present

## 2013-03-24 NOTE — Progress Notes (Signed)
Patient ID: Debra Tran, female   DOB: Nov 08, 1927, 79 y.o.   MRN: [id number]  Location:  [facility] Provider:  Gwenith Spitz. Renato Gails, D.O., C.M.D.  Code Status:  Full code   Chief Complaint  Patient presents with  . Acute Visit    cough, congestion, fatigue, facial rash     HPI:  79 yo female here for long term care with h/o diabetes, dementia, hyperlipidemia was seen for acute visit due to increased lethargy, rash on her face, increased congestion, cough and sore throat.  Her intake has been poor this am.  She is itching her face.   Noted patient has received flu shot 01/28/13.    Review of Systems:  Review of Systems  Constitutional: Positive for chills and malaise/fatigue. Negative for fever.  HENT: Negative for congestion.   Respiratory: Positive for cough, sputum production and shortness of breath.   Cardiovascular: Negative for chest pain.  Musculoskeletal: Negative for myalgias.  Skin: Positive for rash.  Neurological: Negative for loss of consciousness and headaches.  Endo/Heme/Allergies: Does not bruise/bleed easily.  Psychiatric/Behavioral: Positive for memory loss.    Medications: Patient's Medications  New Prescriptions   No medications on file  Previous Medications   ASPIRIN EC 81 MG TABLET    Take 81 mg by mouth every morning.    ATORVASTATIN (LIPITOR) 20 MG TABLET    Take 1 tablet (20 mg total) by mouth at bedtime.   CALCIUM CARBONATE-VITAMIN D (CALCIUM 600-D) 600-400 MG-UNIT PER TABLET    Take 1 tablet by mouth every morning.    CLOTRIMAZOLE (LOTRIMIN) 1 % CREAM    Apply 1 application topically 2 (two) times daily.   DOCUSATE SODIUM (COLACE) 100 MG CAPSULE    Take 100 mg by mouth 2 (two) times daily.   DONEPEZIL (ARICEPT) 5 MG TABLET    Take 10 mg by mouth at bedtime.    ESCITALOPRAM (LEXAPRO) 10 MG TABLET    Take 20 mg by mouth at bedtime.    FLUTICASONE (FLONASE) 50 MCG/ACT NASAL SPRAY    Place 2 sprays into the nose every morning.    FOOT  CARE PRODUCTS (CORN CUSHIONS) PADS    1 Device by Does not apply route daily.   INSULIN GLARGINE (LANTUS) 100 UNIT/ML INJECTION    Inject 10 Units into the skin at bedtime.   KETOCONAZOLE (NIZORAL) 2 % CREAM    Apply 1 application topically 2 (two) times daily. Apply to groin and buttocks 2 times a day   METFORMIN (GLUCOPHAGE) 500 MG TABLET    Take 250 mg by mouth 2 (two) times daily with a meal.   MULTIPLE VITAMIN (MULTIVITAMIN WITH MINERALS) TABS    Take 1 tablet by mouth every morning.   NORTRIPTYLINE (PAMELOR) 75 MG CAPSULE    Take 75 mg by mouth at bedtime.   OLANZAPINE (ZYPREXA) 5 MG TABLET    Take 1 tablet (5 mg total) by mouth at bedtime.   OMEPRAZOLE (PRILOSEC) 20 MG CAPSULE    Take 1 capsule (20 mg total) by mouth 2 (two) times daily.   PREDNISOLONE ACETATE (PRED FORTE) 1 % OPHTHALMIC SUSPENSION    1 drop 4 (four) times daily.   PRIMIDONE (MYSOLINE) 50 MG TABLET    Take 25 mg by mouth at bedtime.   VITAMIN C (ASCORBIC ACID) 500 MG TABLET    Take 500 mg by mouth daily.  Modified Medications   No medications on file  Discontinued Medications   No medications on  file    Physical Exam: Filed Vitals:   03/24/13 1016  BP: 96/48  Pulse: 78  Temp: 99.6 F (37.6 C)  Resp: 20   Physical Exam  Constitutional:  Ill-appearing female   HENT:  Head: Normocephalic and atraumatic.  Eyes: EOM are normal. Pupils are equal, round, and reactive to light.  Neck: Neck supple.  Cardiovascular: Normal rate, regular rhythm, normal heart sounds and intact distal pulses.   Pulmonary/Chest: Effort normal.  Coarse wet rhonchi throughout both lung fields anteriorly  Abdominal: Soft. Bowel sounds are normal.  Musculoskeletal: Normal range of motion. She exhibits no edema and no tenderness.  Neurological: No cranial nerve deficit.  Drowsy, answers some questions, but not her usual interactive talkative self  Skin:  Erythematous macular rash on face,dry, excoriated     Labs reviewed: Basic  Metabolic Panel:  Recent Labs  14/78/29 1714 07/13/12 0017  NA 133* 137  K 3.7 3.3*  CL 94* 98  CO2 26 23  GLUCOSE 155* 61*  BUN 22 35*  CREATININE 0.81 1.09  CALCIUM 9.1 9.2    Liver Function Tests:  Recent Labs  05/31/12 1714 07/13/12 0017  AST 19 25  ALT 16 16  ALKPHOS 72 72  BILITOT 0.4 0.4  PROT 7.3 6.8  ALBUMIN 3.6 3.5    CBC:  Recent Labs  05/31/12 1714 07/13/12 0017  WBC 7.0 8.1  NEUTROABS  --  5.2  HGB 11.2* 11.8*  HCT 34.4* 36.2  MCV 80.9 81.3  PLT 234 272    Significant Diagnostic Results:  Influenza A POSITIVE  Assessment/Plan 1. Influenza A influenza a swab was positive despite flu shot in october Begin tamiflu bid x 5 days pcxr stat Cbc, bmp stat Push po fluids for 72 hrs--contact md/np if intake too poor or renal functions worsened for IVF orders Hold metformin x 72 hrs duonebs q 6h x 72 hrs Isolation Droplet precautions  2. Localized macular rash D/c eucerin to face due to new rash on face in past day since using

## 2013-03-25 DIAGNOSIS — R0989 Other specified symptoms and signs involving the circulatory and respiratory systems: Secondary | ICD-10-CM | POA: Diagnosis not present

## 2013-03-25 DIAGNOSIS — J1189 Influenza due to unidentified influenza virus with other manifestations: Secondary | ICD-10-CM | POA: Diagnosis not present

## 2013-04-07 ENCOUNTER — Ambulatory Visit: Payer: Medicare Other

## 2013-04-07 DIAGNOSIS — Z79899 Other long term (current) drug therapy: Secondary | ICD-10-CM | POA: Diagnosis not present

## 2013-04-07 DIAGNOSIS — E119 Type 2 diabetes mellitus without complications: Secondary | ICD-10-CM | POA: Diagnosis not present

## 2013-04-17 ENCOUNTER — Encounter: Payer: Self-pay | Admitting: Internal Medicine

## 2013-04-22 ENCOUNTER — Ambulatory Visit (INDEPENDENT_AMBULATORY_CARE_PROVIDER_SITE_OTHER): Payer: Medicare Other

## 2013-04-22 VITALS — BP 144/73 | HR 81 | Resp 18

## 2013-04-22 DIAGNOSIS — E1142 Type 2 diabetes mellitus with diabetic polyneuropathy: Secondary | ICD-10-CM

## 2013-04-22 DIAGNOSIS — L608 Other nail disorders: Secondary | ICD-10-CM | POA: Diagnosis not present

## 2013-04-22 DIAGNOSIS — E1149 Type 2 diabetes mellitus with other diabetic neurological complication: Secondary | ICD-10-CM | POA: Diagnosis not present

## 2013-04-22 DIAGNOSIS — E1140 Type 2 diabetes mellitus with diabetic neuropathy, unspecified: Secondary | ICD-10-CM

## 2013-04-22 DIAGNOSIS — M129 Arthropathy, unspecified: Secondary | ICD-10-CM

## 2013-04-22 DIAGNOSIS — M2040 Other hammer toe(s) (acquired), unspecified foot: Secondary | ICD-10-CM

## 2013-04-22 NOTE — Progress Notes (Signed)
   Subjective:    Patient ID: Debra Tran, female    DOB: 12-Jan-1928, 80 y.o.   MRN: [id number]  HPI I just need my nails cut and I am getting my diabetic shoes today    Review of Systems no new changes or findings noted     Objective:   Physical Exam Vascular status is intact with pedal pulses palpable DP plus one over 4 bilateral PT nonpalpable bilateral temperature warm to cool turgor diminished. Neurologically epicritic and proprioceptive sensations intact although diminished on Semmes Weinstein testing to the digits and plantar forefoot. Nails criptotic incurvated friable 1 through 5 bilateral. There is some diffuse keratoses HD 5 bilateral secondary digital contractures and hammertoe deformities. Biomechanically patient does have rectus foot type digital contractures accommodated in the new pair of diabetic extra-depth shoes dispensed at this time. Patient also says 3 pairs of dual density Plastizote inlays that fit and contour well to the foot with full contact with the arch. Adequate space for toes is identified.      Assessment & Plan:  Assessment this time his diabetes with peripheral neuropathy arthropathy and complications. Patient presents this time for debridement of nails x10 which is carried out also dispensed 1 pair shoes 3 pairs of insoles with instructions oral and written instructions given followup in 3 months for continued palliative care in the future. Next  Harriet Masson DPM

## 2013-04-22 NOTE — Patient Instructions (Signed)
Diabetes and Foot Care Diabetes may cause you to have problems because of poor blood supply (circulation) to your feet and legs. This may cause the skin on your feet to become thinner, break easier, and heal more slowly. Your skin may become dry, and the skin may peel and crack. You may also have nerve damage in your legs and feet causing decreased feeling in them. You may not notice minor injuries to your feet that could lead to infections or more serious problems. Taking care of your feet is one of the most important things you can do for yourself.  HOME CARE INSTRUCTIONS  Wear shoes at all times, even in the house. Do not go barefoot. Bare feet are easily injured.  Check your feet daily for blisters, cuts, and redness. If you cannot see the bottom of your feet, use a mirror or ask someone for help.  Wash your feet with warm water (do not use hot water) and mild soap. Then pat your feet and the areas between your toes until they are completely dry. Do not soak your feet as this can dry your skin.  Apply a moisturizing lotion or petroleum jelly (that does not contain alcohol and is unscented) to the skin on your feet and to dry, brittle toenails. Do not apply lotion between your toes.  Trim your toenails straight across. Do not dig under them or around the cuticle. File the edges of your nails with an emery board or nail file.  Do not cut corns or calluses or try to remove them with medicine.  Wear clean socks or stockings every day. Make sure they are not too tight. Do not wear knee-high stockings since they may decrease blood flow to your legs.  Wear shoes that fit properly and have enough cushioning. To break in new shoes, wear them for just a few hours a day. This prevents you from injuring your feet. Always look in your shoes before you put them on to be sure there are no objects inside.  Do not cross your legs. This may decrease the blood flow to your feet.  If you find a minor scrape,  cut, or break in the skin on your feet, keep it and the skin around it clean and dry. These areas may be cleansed with mild soap and water. Do not cleanse the area with peroxide, alcohol, or iodine.  When you remove an adhesive bandage, be sure not to damage the skin around it.  If you have a wound, look at it several times a day to make sure it is healing.  Do not use heating pads or hot water bottles. They may burn your skin. If you have lost feeling in your feet or legs, you may not know it is happening until it is too late.  Make sure your health care provider performs a complete foot exam at least annually or more often if you have foot problems. Report any cuts, sores, or bruises to your health care provider immediately. SEEK MEDICAL CARE IF:   You have an injury that is not healing.  You have cuts or breaks in the skin.  You have an ingrown nail.  You notice redness on your legs or feet.  You feel burning or tingling in your legs or feet.  You have pain or cramps in your legs and feet.  Your legs or feet are numb.  Your feet always feel cold. SEEK IMMEDIATE MEDICAL CARE IF:   There is increasing redness,   swelling, or pain in or around a wound.  There is a red line that goes up your leg.  Pus is coming from a wound.  You develop a fever or as directed by your health care provider.  You notice a bad smell coming from an ulcer or wound. Document Released: 03/23/2000 Document Revised: 11/26/2012 Document Reviewed: 09/02/2012 [employer] Patient Information 2014 [employer]  

## 2013-05-05 NOTE — Telephone Encounter (Signed)
This encounter was created in error - please disregard.

## 2013-05-07 DIAGNOSIS — F29 Unspecified psychosis not due to a substance or known physiological condition: Secondary | ICD-10-CM | POA: Diagnosis not present

## 2013-05-07 DIAGNOSIS — F329 Major depressive disorder, single episode, unspecified: Secondary | ICD-10-CM | POA: Diagnosis not present

## 2013-05-07 DIAGNOSIS — F03918 Unspecified dementia, unspecified severity, with other behavioral disturbance: Secondary | ICD-10-CM | POA: Diagnosis not present

## 2013-05-07 DIAGNOSIS — F3289 Other specified depressive episodes: Secondary | ICD-10-CM | POA: Diagnosis not present

## 2013-05-07 DIAGNOSIS — F0391 Unspecified dementia with behavioral disturbance: Secondary | ICD-10-CM | POA: Diagnosis not present

## 2013-05-11 DIAGNOSIS — F259 Schizoaffective disorder, unspecified: Secondary | ICD-10-CM | POA: Diagnosis not present

## 2013-05-11 DIAGNOSIS — R262 Difficulty in walking, not elsewhere classified: Secondary | ICD-10-CM | POA: Diagnosis not present

## 2013-05-11 DIAGNOSIS — I10 Essential (primary) hypertension: Secondary | ICD-10-CM | POA: Diagnosis not present

## 2013-05-11 DIAGNOSIS — D649 Anemia, unspecified: Secondary | ICD-10-CM | POA: Diagnosis not present

## 2013-05-11 DIAGNOSIS — G3184 Mild cognitive impairment, so stated: Secondary | ICD-10-CM | POA: Diagnosis not present

## 2013-05-11 DIAGNOSIS — E119 Type 2 diabetes mellitus without complications: Secondary | ICD-10-CM | POA: Diagnosis not present

## 2013-05-11 DIAGNOSIS — M1710 Unilateral primary osteoarthritis, unspecified knee: Secondary | ICD-10-CM | POA: Diagnosis not present

## 2013-05-11 DIAGNOSIS — F0280 Dementia in other diseases classified elsewhere without behavioral disturbance: Secondary | ICD-10-CM | POA: Diagnosis not present

## 2013-05-11 DIAGNOSIS — E785 Hyperlipidemia, unspecified: Secondary | ICD-10-CM | POA: Diagnosis not present

## 2013-05-11 DIAGNOSIS — IMO0001 Reserved for inherently not codable concepts without codable children: Secondary | ICD-10-CM | POA: Diagnosis not present

## 2013-05-11 DIAGNOSIS — M159 Polyosteoarthritis, unspecified: Secondary | ICD-10-CM | POA: Diagnosis not present

## 2013-05-12 DIAGNOSIS — G3184 Mild cognitive impairment, so stated: Secondary | ICD-10-CM | POA: Diagnosis not present

## 2013-05-12 DIAGNOSIS — IMO0001 Reserved for inherently not codable concepts without codable children: Secondary | ICD-10-CM | POA: Diagnosis not present

## 2013-05-12 DIAGNOSIS — I10 Essential (primary) hypertension: Secondary | ICD-10-CM | POA: Diagnosis not present

## 2013-05-12 DIAGNOSIS — F0280 Dementia in other diseases classified elsewhere without behavioral disturbance: Secondary | ICD-10-CM | POA: Diagnosis not present

## 2013-05-12 DIAGNOSIS — F259 Schizoaffective disorder, unspecified: Secondary | ICD-10-CM | POA: Diagnosis not present

## 2013-05-13 ENCOUNTER — Non-Acute Institutional Stay (SKILLED_NURSING_FACILITY): Payer: Medicare Other | Admitting: Internal Medicine

## 2013-05-13 ENCOUNTER — Encounter (INDEPENDENT_AMBULATORY_CARE_PROVIDER_SITE_OTHER): Payer: Medicare Other | Admitting: Ophthalmology

## 2013-05-13 DIAGNOSIS — D649 Anemia, unspecified: Secondary | ICD-10-CM

## 2013-05-13 DIAGNOSIS — F259 Schizoaffective disorder, unspecified: Secondary | ICD-10-CM | POA: Diagnosis not present

## 2013-05-13 DIAGNOSIS — F329 Major depressive disorder, single episode, unspecified: Secondary | ICD-10-CM

## 2013-05-13 DIAGNOSIS — IMO0001 Reserved for inherently not codable concepts without codable children: Secondary | ICD-10-CM | POA: Diagnosis not present

## 2013-05-13 DIAGNOSIS — E119 Type 2 diabetes mellitus without complications: Secondary | ICD-10-CM

## 2013-05-13 DIAGNOSIS — G3184 Mild cognitive impairment, so stated: Secondary | ICD-10-CM | POA: Diagnosis not present

## 2013-05-13 DIAGNOSIS — I10 Essential (primary) hypertension: Secondary | ICD-10-CM | POA: Diagnosis not present

## 2013-05-13 DIAGNOSIS — E785 Hyperlipidemia, unspecified: Secondary | ICD-10-CM | POA: Diagnosis not present

## 2013-05-13 DIAGNOSIS — F0390 Unspecified dementia without behavioral disturbance: Secondary | ICD-10-CM

## 2013-05-13 DIAGNOSIS — F0280 Dementia in other diseases classified elsewhere without behavioral disturbance: Secondary | ICD-10-CM | POA: Diagnosis not present

## 2013-05-13 DIAGNOSIS — F3289 Other specified depressive episodes: Secondary | ICD-10-CM

## 2013-05-13 NOTE — Progress Notes (Signed)
Patient ID: Debra Tran, female   DOB: 06-06-1927, 80 y.o.   MRN: [id number]  Location:  [facility] Provider:  Rexene Edison. Mariea Clonts, D.O., C.M.D.  Code Status:  Full code  Chief Complaint  Patient presents with  . Medical Management of Chronic Issues    HPI:  80 yo white female long term care resident with h/o hypertension, schizophrenia with h/o ECT, dementia, DMII, osteoarthritis, hyperlipidemia, and depression was seen for medical mgt of chronic diseases.  She c/o arthritis in her knees today.    Review of Systems:  Review of Systems  Constitutional: Negative for fever.  Respiratory: Negative for shortness of breath.   Cardiovascular: Negative for chest pain.  Gastrointestinal: Negative for abdominal pain.  Genitourinary: Positive for urgency.  Musculoskeletal: Positive for joint pain. Negative for falls.  Neurological: Negative for headaches.  Endo/Heme/Allergies: Bruises/bleeds easily.  Psychiatric/Behavioral: Positive for depression and memory loss.    Medications: Patient's Medications  New Prescriptions   No medications on file  Previous Medications   ASPIRIN EC 81 MG TABLET    Take 81 mg by mouth every morning.    ATORVASTATIN (LIPITOR) 20 MG TABLET    Take 1 tablet (20 mg total) by mouth at bedtime.   CALCIUM CARBONATE-VITAMIN D (CALCIUM 600-D) 600-400 MG-UNIT PER TABLET    Take 1 tablet by mouth every morning.    CLOTRIMAZOLE (LOTRIMIN) 1 % CREAM    Apply 1 application topically 2 (two) times daily.   DOCUSATE SODIUM (COLACE) 100 MG CAPSULE    Take 100 mg by mouth 2 (two) times daily.   DONEPEZIL (ARICEPT) 5 MG TABLET    Take 10 mg by mouth at bedtime.    ESCITALOPRAM (LEXAPRO) 10 MG TABLET    Take 20 mg by mouth at bedtime.    FLUTICASONE (FLONASE) 50 MCG/ACT NASAL SPRAY    Place 2 sprays into the nose every morning.    FOOT CARE PRODUCTS (CORN CUSHIONS) PADS    1 Device by Does not apply route daily.   INSULIN GLARGINE (LANTUS) 100 UNIT/ML  INJECTION    Inject 10 Units into the skin at bedtime.   KETOCONAZOLE (NIZORAL) 2 % CREAM    Apply 1 application topically 2 (two) times daily. Apply to groin and buttocks 2 times a day   METFORMIN (GLUCOPHAGE) 500 MG TABLET    Take 250 mg by mouth 2 (two) times daily with a meal.   MULTIPLE VITAMIN (MULTIVITAMIN WITH MINERALS) TABS    Take 1 tablet by mouth every morning.   NORTRIPTYLINE (PAMELOR) 75 MG CAPSULE    Take 75 mg by mouth at bedtime.   OLANZAPINE (ZYPREXA) 5 MG TABLET    Take 1 tablet (5 mg total) by mouth at bedtime.   OMEPRAZOLE (PRILOSEC) 20 MG CAPSULE    Take 1 capsule (20 mg total) by mouth 2 (two) times daily.   PREDNISOLONE ACETATE (PRED FORTE) 1 % OPHTHALMIC SUSPENSION    1 drop 4 (four) times daily.   PRIMIDONE (MYSOLINE) 50 MG TABLET    Take 25 mg by mouth at bedtime.   VITAMIN C (ASCORBIC ACID) 500 MG TABLET    Take 500 mg by mouth daily.  Modified Medications   No medications on file  Discontinued Medications   No medications on file    Physical Exam: Filed Vitals:   05/13/13 1347  BP: 122/63  Pulse: 68  Temp: 98 F (36.7 C)  Resp: 18  Height: 5' (1.524 m)  Weight:  172 lb (78.019 kg)  SpO2: 98%   Physical Exam  Constitutional: She appears well-developed and well-nourished. No distress.  Pleasantly confused white lady sitting up in wheelchair  Cardiovascular: Normal rate, regular rhythm, normal heart sounds and intact distal pulses.   Pulmonary/Chest: Effort normal and breath sounds normal. No respiratory distress.  Abdominal: Soft. Bowel sounds are normal. She exhibits no distension and no mass. There is no tenderness.  Musculoskeletal: Normal range of motion.  Neurological: She is alert.  Skin: Skin is warm and dry.     Labs reviewed: 04/07/13:  hba1c 7.4  Assessment/Plan 1. Type II or unspecified type diabetes mellitus without mention of complication, not stated as uncontrolled -last hba1c at goal for her age -cont metformin with lantus at hs,  lipitor, asa, note on ace/arb, but bp runs low w/o meds -f/u hba1c 2. Other and unspecified hyperlipidemia -cont statin, f/u labs  3. Essential hypertension, benign -no longer has this--bp runs low w/o any bp meds -if did run high, would use ace/arb  4. Anemia, unspecified -f/u cbc  5. Depressive disorder, not elsewhere classified -cont zyprexa, pamelor lexapro as per psychiatry -has schizophrenia dx also and had ECT in past  6. Dementia, without behavioral disturbance -cont aricept, not on namenda, but would consider as she still participates in activities and eats on her own  Family/ staff Communication: spoke with her nurse  Goals of care: long term care resident  Labs/tests ordered:  Cbc, bmp, hba1c, flp

## 2013-05-14 DIAGNOSIS — F03918 Unspecified dementia, unspecified severity, with other behavioral disturbance: Secondary | ICD-10-CM | POA: Diagnosis not present

## 2013-05-14 DIAGNOSIS — F329 Major depressive disorder, single episode, unspecified: Secondary | ICD-10-CM | POA: Diagnosis not present

## 2013-05-14 DIAGNOSIS — F0280 Dementia in other diseases classified elsewhere without behavioral disturbance: Secondary | ICD-10-CM | POA: Diagnosis not present

## 2013-05-14 DIAGNOSIS — G3184 Mild cognitive impairment, so stated: Secondary | ICD-10-CM | POA: Diagnosis not present

## 2013-05-14 DIAGNOSIS — I10 Essential (primary) hypertension: Secondary | ICD-10-CM | POA: Diagnosis not present

## 2013-05-14 DIAGNOSIS — F29 Unspecified psychosis not due to a substance or known physiological condition: Secondary | ICD-10-CM | POA: Diagnosis not present

## 2013-05-14 DIAGNOSIS — IMO0001 Reserved for inherently not codable concepts without codable children: Secondary | ICD-10-CM | POA: Diagnosis not present

## 2013-05-14 DIAGNOSIS — F259 Schizoaffective disorder, unspecified: Secondary | ICD-10-CM | POA: Diagnosis not present

## 2013-05-14 DIAGNOSIS — F3289 Other specified depressive episodes: Secondary | ICD-10-CM | POA: Diagnosis not present

## 2013-05-14 DIAGNOSIS — F0391 Unspecified dementia with behavioral disturbance: Secondary | ICD-10-CM | POA: Diagnosis not present

## 2013-05-15 DIAGNOSIS — G3184 Mild cognitive impairment, so stated: Secondary | ICD-10-CM | POA: Diagnosis not present

## 2013-05-15 DIAGNOSIS — F0280 Dementia in other diseases classified elsewhere without behavioral disturbance: Secondary | ICD-10-CM | POA: Diagnosis not present

## 2013-05-15 DIAGNOSIS — IMO0001 Reserved for inherently not codable concepts without codable children: Secondary | ICD-10-CM | POA: Diagnosis not present

## 2013-05-15 DIAGNOSIS — I10 Essential (primary) hypertension: Secondary | ICD-10-CM | POA: Diagnosis not present

## 2013-05-15 DIAGNOSIS — F259 Schizoaffective disorder, unspecified: Secondary | ICD-10-CM | POA: Diagnosis not present

## 2013-05-18 DIAGNOSIS — G3184 Mild cognitive impairment, so stated: Secondary | ICD-10-CM | POA: Diagnosis not present

## 2013-05-18 DIAGNOSIS — F259 Schizoaffective disorder, unspecified: Secondary | ICD-10-CM | POA: Diagnosis not present

## 2013-05-18 DIAGNOSIS — I10 Essential (primary) hypertension: Secondary | ICD-10-CM | POA: Diagnosis not present

## 2013-05-18 DIAGNOSIS — F0280 Dementia in other diseases classified elsewhere without behavioral disturbance: Secondary | ICD-10-CM | POA: Diagnosis not present

## 2013-05-18 DIAGNOSIS — IMO0001 Reserved for inherently not codable concepts without codable children: Secondary | ICD-10-CM | POA: Diagnosis not present

## 2013-05-19 DIAGNOSIS — I10 Essential (primary) hypertension: Secondary | ICD-10-CM | POA: Diagnosis not present

## 2013-05-19 DIAGNOSIS — F0280 Dementia in other diseases classified elsewhere without behavioral disturbance: Secondary | ICD-10-CM | POA: Diagnosis not present

## 2013-05-19 DIAGNOSIS — F259 Schizoaffective disorder, unspecified: Secondary | ICD-10-CM | POA: Diagnosis not present

## 2013-05-19 DIAGNOSIS — G3184 Mild cognitive impairment, so stated: Secondary | ICD-10-CM | POA: Diagnosis not present

## 2013-05-19 DIAGNOSIS — IMO0001 Reserved for inherently not codable concepts without codable children: Secondary | ICD-10-CM | POA: Diagnosis not present

## 2013-05-20 DIAGNOSIS — IMO0001 Reserved for inherently not codable concepts without codable children: Secondary | ICD-10-CM | POA: Diagnosis not present

## 2013-05-20 DIAGNOSIS — F259 Schizoaffective disorder, unspecified: Secondary | ICD-10-CM | POA: Diagnosis not present

## 2013-05-20 DIAGNOSIS — I10 Essential (primary) hypertension: Secondary | ICD-10-CM | POA: Diagnosis not present

## 2013-05-20 DIAGNOSIS — G3184 Mild cognitive impairment, so stated: Secondary | ICD-10-CM | POA: Diagnosis not present

## 2013-05-20 DIAGNOSIS — F0280 Dementia in other diseases classified elsewhere without behavioral disturbance: Secondary | ICD-10-CM | POA: Diagnosis not present

## 2013-05-21 DIAGNOSIS — I10 Essential (primary) hypertension: Secondary | ICD-10-CM | POA: Diagnosis not present

## 2013-05-21 DIAGNOSIS — F259 Schizoaffective disorder, unspecified: Secondary | ICD-10-CM | POA: Diagnosis not present

## 2013-05-21 DIAGNOSIS — G3184 Mild cognitive impairment, so stated: Secondary | ICD-10-CM | POA: Diagnosis not present

## 2013-05-21 DIAGNOSIS — F0280 Dementia in other diseases classified elsewhere without behavioral disturbance: Secondary | ICD-10-CM | POA: Diagnosis not present

## 2013-05-21 DIAGNOSIS — IMO0001 Reserved for inherently not codable concepts without codable children: Secondary | ICD-10-CM | POA: Diagnosis not present

## 2013-05-22 DIAGNOSIS — I10 Essential (primary) hypertension: Secondary | ICD-10-CM | POA: Diagnosis not present

## 2013-05-22 DIAGNOSIS — G3184 Mild cognitive impairment, so stated: Secondary | ICD-10-CM | POA: Diagnosis not present

## 2013-05-22 DIAGNOSIS — IMO0001 Reserved for inherently not codable concepts without codable children: Secondary | ICD-10-CM | POA: Diagnosis not present

## 2013-05-22 DIAGNOSIS — F259 Schizoaffective disorder, unspecified: Secondary | ICD-10-CM | POA: Diagnosis not present

## 2013-05-22 DIAGNOSIS — F0280 Dementia in other diseases classified elsewhere without behavioral disturbance: Secondary | ICD-10-CM | POA: Diagnosis not present

## 2013-05-25 DIAGNOSIS — I10 Essential (primary) hypertension: Secondary | ICD-10-CM | POA: Diagnosis not present

## 2013-05-25 DIAGNOSIS — G3184 Mild cognitive impairment, so stated: Secondary | ICD-10-CM | POA: Diagnosis not present

## 2013-05-25 DIAGNOSIS — F0280 Dementia in other diseases classified elsewhere without behavioral disturbance: Secondary | ICD-10-CM | POA: Diagnosis not present

## 2013-05-25 DIAGNOSIS — IMO0001 Reserved for inherently not codable concepts without codable children: Secondary | ICD-10-CM | POA: Diagnosis not present

## 2013-05-25 DIAGNOSIS — F259 Schizoaffective disorder, unspecified: Secondary | ICD-10-CM | POA: Diagnosis not present

## 2013-05-26 DIAGNOSIS — IMO0001 Reserved for inherently not codable concepts without codable children: Secondary | ICD-10-CM | POA: Diagnosis not present

## 2013-05-26 DIAGNOSIS — F0280 Dementia in other diseases classified elsewhere without behavioral disturbance: Secondary | ICD-10-CM | POA: Diagnosis not present

## 2013-05-26 DIAGNOSIS — G3184 Mild cognitive impairment, so stated: Secondary | ICD-10-CM | POA: Diagnosis not present

## 2013-05-26 DIAGNOSIS — F259 Schizoaffective disorder, unspecified: Secondary | ICD-10-CM | POA: Diagnosis not present

## 2013-05-26 DIAGNOSIS — I10 Essential (primary) hypertension: Secondary | ICD-10-CM | POA: Diagnosis not present

## 2013-06-30 ENCOUNTER — Non-Acute Institutional Stay (SKILLED_NURSING_FACILITY): Payer: Medicare Other | Admitting: Internal Medicine

## 2013-06-30 ENCOUNTER — Ambulatory Visit (INDEPENDENT_AMBULATORY_CARE_PROVIDER_SITE_OTHER): Payer: Medicare Other

## 2013-06-30 ENCOUNTER — Encounter: Payer: Self-pay | Admitting: Internal Medicine

## 2013-06-30 VITALS — BP 218/83 | HR 90 | Resp 16

## 2013-06-30 DIAGNOSIS — L608 Other nail disorders: Secondary | ICD-10-CM

## 2013-06-30 DIAGNOSIS — M159 Polyosteoarthritis, unspecified: Secondary | ICD-10-CM | POA: Diagnosis not present

## 2013-06-30 DIAGNOSIS — E119 Type 2 diabetes mellitus without complications: Secondary | ICD-10-CM | POA: Diagnosis not present

## 2013-06-30 DIAGNOSIS — R21 Rash and other nonspecific skin eruption: Secondary | ICD-10-CM

## 2013-06-30 DIAGNOSIS — E1149 Type 2 diabetes mellitus with other diabetic neurological complication: Secondary | ICD-10-CM | POA: Diagnosis not present

## 2013-06-30 DIAGNOSIS — I10 Essential (primary) hypertension: Secondary | ICD-10-CM

## 2013-06-30 DIAGNOSIS — E785 Hyperlipidemia, unspecified: Secondary | ICD-10-CM

## 2013-06-30 DIAGNOSIS — IMO0001 Reserved for inherently not codable concepts without codable children: Secondary | ICD-10-CM | POA: Diagnosis not present

## 2013-06-30 DIAGNOSIS — F259 Schizoaffective disorder, unspecified: Secondary | ICD-10-CM | POA: Diagnosis not present

## 2013-06-30 DIAGNOSIS — M2040 Other hammer toe(s) (acquired), unspecified foot: Secondary | ICD-10-CM

## 2013-06-30 DIAGNOSIS — M1710 Unilateral primary osteoarthritis, unspecified knee: Secondary | ICD-10-CM | POA: Diagnosis not present

## 2013-06-30 DIAGNOSIS — G3184 Mild cognitive impairment, so stated: Secondary | ICD-10-CM | POA: Diagnosis not present

## 2013-06-30 DIAGNOSIS — E1140 Type 2 diabetes mellitus with diabetic neuropathy, unspecified: Secondary | ICD-10-CM

## 2013-06-30 DIAGNOSIS — F03A0 Unspecified dementia, mild, without behavioral disturbance, psychotic disturbance, mood disturbance, and anxiety: Secondary | ICD-10-CM

## 2013-06-30 DIAGNOSIS — M129 Arthropathy, unspecified: Secondary | ICD-10-CM

## 2013-06-30 DIAGNOSIS — D649 Anemia, unspecified: Secondary | ICD-10-CM | POA: Diagnosis not present

## 2013-06-30 DIAGNOSIS — F0390 Unspecified dementia without behavioral disturbance: Secondary | ICD-10-CM

## 2013-06-30 DIAGNOSIS — R1310 Dysphagia, unspecified: Secondary | ICD-10-CM | POA: Diagnosis not present

## 2013-06-30 DIAGNOSIS — F0280 Dementia in other diseases classified elsewhere without behavioral disturbance: Secondary | ICD-10-CM | POA: Diagnosis not present

## 2013-06-30 NOTE — Assessment & Plan Note (Signed)
BP at goal of < 150/90

## 2013-06-30 NOTE — Assessment & Plan Note (Signed)
FLP was never ordered. Ordered it today

## 2013-06-30 NOTE — Assessment & Plan Note (Addendum)
04/07/13 HbA1c was 7.4;a little up but good control for age; ordered again today

## 2013-06-30 NOTE — Assessment & Plan Note (Signed)
Pt is on aricept 10 mg qHS;doing well

## 2013-06-30 NOTE — Patient Instructions (Signed)
Diabetes and Foot Care Diabetes may cause you to have problems because of poor blood supply (circulation) to your feet and legs. This may cause the skin on your feet to become thinner, break easier, and heal more slowly. Your skin may become dry, and the skin may peel and crack. You may also have nerve damage in your legs and feet causing decreased feeling in them. You may not notice minor injuries to your feet that could lead to infections or more serious problems. Taking care of your feet is one of the most important things you can do for yourself.  HOME CARE INSTRUCTIONS  Wear shoes at all times, even in the house. Do not go barefoot. Bare feet are easily injured.  Check your feet daily for blisters, cuts, and redness. If you cannot see the bottom of your feet, use a mirror or ask someone for help.  Wash your feet with warm water (do not use hot water) and mild soap. Then pat your feet and the areas between your toes until they are completely dry. Do not soak your feet as this can dry your skin.  Apply a moisturizing lotion or petroleum jelly (that does not contain alcohol and is unscented) to the skin on your feet and to dry, brittle toenails. Do not apply lotion between your toes.  Trim your toenails straight across. Do not dig under them or around the cuticle. File the edges of your nails with an emery board or nail file.  Do not cut corns or calluses or try to remove them with medicine.  Wear clean socks or stockings every day. Make sure they are not too tight. Do not wear knee-high stockings since they may decrease blood flow to your legs.  Wear shoes that fit properly and have enough cushioning. To break in new shoes, wear them for just a few hours a day. This prevents you from injuring your feet. Always look in your shoes before you put them on to be sure there are no objects inside.  Do not cross your legs. This may decrease the blood flow to your feet.  If you find a minor scrape,  cut, or break in the skin on your feet, keep it and the skin around it clean and dry. These areas may be cleansed with mild soap and water. Do not cleanse the area with peroxide, alcohol, or iodine.  When you remove an adhesive bandage, be sure not to damage the skin around it.  If you have a wound, look at it several times a day to make sure it is healing.  Do not use heating pads or hot water bottles. They may burn your skin. If you have lost feeling in your feet or legs, you may not know it is happening until it is too late.  Make sure your health care provider performs a complete foot exam at least annually or more often if you have foot problems. Report any cuts, sores, or bruises to your health care provider immediately. SEEK MEDICAL CARE IF:   You have an injury that is not healing.  You have cuts or breaks in the skin.  You have an ingrown nail.  You notice redness on your legs or feet.  You feel burning or tingling in your legs or feet.  You have pain or cramps in your legs and feet.  Your legs or feet are numb.  Your feet always feel cold. SEEK IMMEDIATE MEDICAL CARE IF:   There is increasing redness,   swelling, or pain in or around a wound.  There is a red line that goes up your leg.  Pus is coming from a wound.  You develop a fever or as directed by your health care provider.  You notice a bad smell coming from an ulcer or wound. Document Released: 03/23/2000 Document Revised: 11/26/2012 Document Reviewed: 09/02/2012 [employer] Patient Information 2014 [employer]  

## 2013-06-30 NOTE — Progress Notes (Signed)
MRN: [id number] Name: Debra Tran  Sex: female Age: 80 y.o. DOB: 1927/09/30  [facility] #: [facility] Facility/Room: 214A Level Of Care: SNF Provider: Inocencio Homes D Emergency Contacts: Extended Emergency Contact Information Primary Emergency Contact: Adebayo,[name] Address: [address]          [place] Phone: [phone] Relation: Daughter Secondary Emergency Contact: [name] Kick [place] of [place] Phone: [phone] Relation: Other  Code Status: FULL  Allergies: Ether  Chief Complaint  Patient presents with  . Medical Managment of Chronic Issues    HPI: Patient is 80 y.o. female who is being seen today for routine problems and a few concerns her daughter has.  Past Medical History  Diagnosis Date  . Phobia   . Macular degeneration   . Narcissism   . Schizophrenia, paranoid   . Fungal dermatitis   . Hyperlipidemia   . Mild cognitive impairment   . Diabetes mellitus type 2 in obese   . Stress incontinence, female   . Allergic rhinitis   . Normocytic anemia   . Depressive disorder   . Hypertension     Past Surgical History  Procedure Laterality Date  . Cataract extraction  6 16 2006  . Cryotherapy  2 15 2006    facial AK  . Total abdominal hysterectomy w/ bilateral salpingoophorectomy      due to endometriosis  non malignant  . Hyperplastic   4 20 2006    AK shave biopsy  . Abdominal hysterectomy      due to fibroids      Medication List       This list is accurate as of: 06/30/13  3:14 PM.  Always use your most recent med list.               aspirin EC 81 MG tablet  Take 81 mg by mouth every morning.     atorvastatin 20 MG tablet  Commonly known as:  LIPITOR  Take 1 tablet (20 mg total) by mouth at bedtime.     CALCIUM 600-D 600-400 MG-UNIT per tablet  Generic drug:  Calcium Carbonate-Vitamin D  Take 1 tablet by mouth every morning.     clotrimazole 1 % cream  Commonly known as:   LOTRIMIN  Apply 1 application topically 2 (two) times daily.     Corn Cushions Pads  1 Device by Does not apply route daily.     docusate sodium 100 MG capsule  Commonly known as:  COLACE  Take 100 mg by mouth 2 (two) times daily.     donepezil 5 MG tablet  Commonly known as:  ARICEPT  Take 10 mg by mouth at bedtime.     escitalopram 10 MG tablet  Commonly known as:  LEXAPRO  Take 20 mg by mouth at bedtime.     fluticasone 50 MCG/ACT nasal spray  Commonly known as:  FLONASE  Place 2 sprays into the nose every morning.     insulin glargine 100 UNIT/ML injection  Commonly known as:  LANTUS  Inject 10 Units into the skin at bedtime.     ketoconazole 2 % cream  Commonly known as:  NIZORAL  Apply 1 application topically 2 (two) times daily. Apply to groin and buttocks 2 times a day     metFORMIN 500 MG tablet  Commonly known as:  GLUCOPHAGE  Take 250 mg by mouth 2 (two) times daily with a meal.     multivitamin with  minerals Tabs tablet  Take 1 tablet by mouth every morning.     nortriptyline 75 MG capsule  Commonly known as:  PAMELOR  Take 75 mg by mouth at bedtime.     OLANZapine 5 MG tablet  Commonly known as:  ZYPREXA  Take 1 tablet (5 mg total) by mouth at bedtime.     omeprazole 20 MG capsule  Commonly known as:  PRILOSEC  Take 1 capsule (20 mg total) by mouth 2 (two) times daily.     prednisoLONE acetate 1 % ophthalmic suspension  Commonly known as:  PRED FORTE  1 drop 4 (four) times daily.     primidone 50 MG tablet  Commonly known as:  MYSOLINE  Take 25 mg by mouth at bedtime.     vitamin C 500 MG tablet  Commonly known as:  ASCORBIC ACID  Take 500 mg by mouth daily.        No orders of the defined types were placed in this encounter.    Immunization History  Administered Date(s) Administered  . Influenza Split 02/15/2011, 01/21/2012  . Influenza Whole 02/05/2007, 01/17/2010, 01/28/2013  . Pneumococcal Polysaccharide-23 04/10/1999  . Td  10/07/2001    History  Substance Use Topics  . Smoking status: Former Research scientist (life sciences)  . Smokeless tobacco: Not on file  . Alcohol Use: No    Review of Systems  DATA OBTAINED: from patient,family member GENERAL: Feels well no fevers, fatigue, appetite changes SKIN: Rash on face is better. It was red and peeling last week.It does not itch or hurt. It recurs often. She does not know what it is. HEENT: No complaint RESPIRATORY: No cough, wheezing, SOB CARDIAC: No chest pain, palpitations, lower extremity edema  GI: No abdominal pain, No N/V/D or constipation, No heartburn or reflux  GU: No dysuria, frequency or urgency, or incontinence  MUSCULOSKELETAL: No unrelieved bone/joint pain NEUROLOGIC: No headache, dizziness or focal weakness PSYCHIATRIC: No overt anxiety or sadness. Sleeps well.   Filed Vitals:   06/30/13 1413  BP: 140/76  Pulse: 65  Temp: 98.5 F (36.9 C)  Resp: 16    Physical Exam  GENERAL APPEARANCE: Alert, conversant. Appropriately groomed. No acute distress  SKIN:face mildly pink in areas;no peeling, no infection no typical lesions HEENT: Unremarkable RESPIRATORY: Breathing is even, unlabored. Lung sounds are clear   CARDIOVASCULAR: Heart RRR no murmurs, rubs or gallops. No peripheral edema  GASTROINTESTINAL: Abdomen is soft, non-tender, not distended w/ normal bowel sounds.  GENITOURINARY: Bladder non tender, not distended  MUSCULOSKELETAL: pt has a small bony ridge/osteophyte on L patella NEUROLOGIC: Cranial nerves 2-12 grossly intact. Moves all extremities no tremor. PSYCHIATRIC: Mood and affect appropriate to situation, no behavioral issues  Patient Active Problem List   Diagnosis Date Noted  . Normocytic anemia   . Depressive disorder   . Hypertension   . Tinea corporis 10/15/2012  . Arch pain of left foot 10/15/2012  . Right ankle pain 03/04/2012  . Dementia 12/12/2011  . Weight loss 08/31/2011  . Vaginal odor 08/31/2011  . Repeated falls 03/12/2011   . Acid indigestion 06/15/2010  . FUNGAL DERMATITIS 03/14/2010  . ANEMIA, NORMOCYTIC 10/13/2009  . Mild dementia 10/13/2009  . DIZZINESS 10/13/2009  . GAIT DISTURBANCE 10/13/2009  . CORNS AND CALLOSITIES 09/20/2009  . ARTHRITIS 09/20/2009  . DIABETES MELLITUS II, UNCOMPLICATED 78/29/5621  . HYPERLIPIDEMIA 06/06/2006  . OBESITY, NOS 06/06/2006  . SCHIZOPHRENIA 06/06/2006  . DEPRESSIVE DISORDER, NOS 06/06/2006  . CATARACT 06/06/2006  . HYPERTENSION, BENIGN SYSTEMIC 06/06/2006  .  RHINITIS, ALLERGIC 06/06/2006  . INCONTINENCE, STRESS, FEMALE 06/06/2006    CBC    Component Value Date/Time   WBC 8.1 07/13/2012 0017   RBC 4.45 07/13/2012 0017   HGB 11.8* 07/13/2012 0017   HCT 36.2 07/13/2012 0017   PLT 272 07/13/2012 0017   MCV 81.3 07/13/2012 0017   LYMPHSABS 2.3 07/13/2012 0017   MONOABS 0.6 07/13/2012 0017   EOSABS 0.0 07/13/2012 0017   BASOSABS 0.1 07/13/2012 0017    CMP     Component Value Date/Time   NA 137 07/13/2012 0017   K 3.3* 07/13/2012 0017   CL 98 07/13/2012 0017   CO2 23 07/13/2012 0017   GLUCOSE 61* 07/13/2012 0017   BUN 35* 07/13/2012 0017   CREATININE 1.09 07/13/2012 0017   CREATININE 1.00 08/31/2011 1124   CALCIUM 9.2 07/13/2012 0017   PROT 6.8 07/13/2012 0017   ALBUMIN 3.5 07/13/2012 0017   AST 25 07/13/2012 0017   ALT 16 07/13/2012 0017   ALKPHOS 72 07/13/2012 0017   BILITOT 0.4 07/13/2012 0017   GFRNONAA 46* 07/13/2012 0017   GFRAA 53* 07/13/2012 0017    Assessment and Plan  ARTHRITIS Pt has a bony ridge L patella, arthritic osteophyte; doesn't require any specific treatment  HYPERTENSION, BENIGN SYSTEMIC BP at goal of < 150/90  DIABETES MELLITUS II, UNCOMPLICATED 24/40/10 UVO5D was 7.4;a little up but good control for age; ordered again today  HYPERLIPIDEMIA FLP was never ordered. Ordered it today   Mild dementia Pt is on aricept 10 mg qHS;doing well  FACIAL RASH- recurrent, red peeling, spontaneously resolves;appt with dermatology  Hennie Duos, MD

## 2013-06-30 NOTE — Progress Notes (Signed)
   Subjective:    Patient ID: Debra Tran, female    DOB: [birth year], 80 y.o.   MRN: [id number]  HPI Comments: "She needs her toenails cut"     Review of Systems no new changes or findings     Objective:   Physical Exam Okay objective findings vascular status intact pedal pulses DP postal for PT nonpalpable absent hair growth diminished skin texture and turgor epicritic and proprioceptive sensations diminished on Semmes Weinstein testing. Nails thick brittle crumbly friable 1 through 5 bilateral there is no open wounds ulcerations no secondary infection is mild digital contractures hammertoe deformities are noted. Patient wearing her diabetic shoes that fit and contour well has been having some difficulty with swellingmild + edema this may be secondary to dependency patient is a wheelchair with her feet down for long periods of time patient and daughter advised that the shoes causes swelling but the swelling causing shoes to get tight will monitor this over time suggested elevating the feet with possible.       Assessment & Plan:  Assessment diabetes with complications and peripheral neuropathy multiple dystrophic from criptotic nails debrided x10 the presence of diabetes and complications return for future palliative care and as-needed basis suggest 3 month followup next  Harriet Masson DPM

## 2013-06-30 NOTE — Assessment & Plan Note (Signed)
Pt has a bony ridge L patella, arthritic osteophyte; doesn't require any specific treatment

## 2013-07-01 DIAGNOSIS — I10 Essential (primary) hypertension: Secondary | ICD-10-CM | POA: Diagnosis not present

## 2013-07-01 DIAGNOSIS — D649 Anemia, unspecified: Secondary | ICD-10-CM | POA: Diagnosis not present

## 2013-07-01 DIAGNOSIS — F259 Schizoaffective disorder, unspecified: Secondary | ICD-10-CM | POA: Diagnosis not present

## 2013-07-01 DIAGNOSIS — G3184 Mild cognitive impairment, so stated: Secondary | ICD-10-CM | POA: Diagnosis not present

## 2013-07-01 DIAGNOSIS — F0280 Dementia in other diseases classified elsewhere without behavioral disturbance: Secondary | ICD-10-CM | POA: Diagnosis not present

## 2013-07-01 DIAGNOSIS — E785 Hyperlipidemia, unspecified: Secondary | ICD-10-CM | POA: Diagnosis not present

## 2013-07-01 DIAGNOSIS — IMO0001 Reserved for inherently not codable concepts without codable children: Secondary | ICD-10-CM | POA: Diagnosis not present

## 2013-07-01 DIAGNOSIS — E119 Type 2 diabetes mellitus without complications: Secondary | ICD-10-CM | POA: Diagnosis not present

## 2013-07-02 DIAGNOSIS — F259 Schizoaffective disorder, unspecified: Secondary | ICD-10-CM | POA: Diagnosis not present

## 2013-07-02 DIAGNOSIS — G3184 Mild cognitive impairment, so stated: Secondary | ICD-10-CM | POA: Diagnosis not present

## 2013-07-02 DIAGNOSIS — IMO0001 Reserved for inherently not codable concepts without codable children: Secondary | ICD-10-CM | POA: Diagnosis not present

## 2013-07-02 DIAGNOSIS — I10 Essential (primary) hypertension: Secondary | ICD-10-CM | POA: Diagnosis not present

## 2013-07-02 DIAGNOSIS — F0280 Dementia in other diseases classified elsewhere without behavioral disturbance: Secondary | ICD-10-CM | POA: Diagnosis not present

## 2013-07-03 DIAGNOSIS — G3184 Mild cognitive impairment, so stated: Secondary | ICD-10-CM | POA: Diagnosis not present

## 2013-07-03 DIAGNOSIS — IMO0001 Reserved for inherently not codable concepts without codable children: Secondary | ICD-10-CM | POA: Diagnosis not present

## 2013-07-03 DIAGNOSIS — I10 Essential (primary) hypertension: Secondary | ICD-10-CM | POA: Diagnosis not present

## 2013-07-03 DIAGNOSIS — F0280 Dementia in other diseases classified elsewhere without behavioral disturbance: Secondary | ICD-10-CM | POA: Diagnosis not present

## 2013-07-03 DIAGNOSIS — F259 Schizoaffective disorder, unspecified: Secondary | ICD-10-CM | POA: Diagnosis not present

## 2013-07-06 DIAGNOSIS — F259 Schizoaffective disorder, unspecified: Secondary | ICD-10-CM | POA: Diagnosis not present

## 2013-07-06 DIAGNOSIS — I10 Essential (primary) hypertension: Secondary | ICD-10-CM | POA: Diagnosis not present

## 2013-07-06 DIAGNOSIS — IMO0001 Reserved for inherently not codable concepts without codable children: Secondary | ICD-10-CM | POA: Diagnosis not present

## 2013-07-06 DIAGNOSIS — F0280 Dementia in other diseases classified elsewhere without behavioral disturbance: Secondary | ICD-10-CM | POA: Diagnosis not present

## 2013-07-06 DIAGNOSIS — G3184 Mild cognitive impairment, so stated: Secondary | ICD-10-CM | POA: Diagnosis not present

## 2013-07-07 DIAGNOSIS — F0280 Dementia in other diseases classified elsewhere without behavioral disturbance: Secondary | ICD-10-CM | POA: Diagnosis not present

## 2013-07-07 DIAGNOSIS — I10 Essential (primary) hypertension: Secondary | ICD-10-CM | POA: Diagnosis not present

## 2013-07-07 DIAGNOSIS — G3184 Mild cognitive impairment, so stated: Secondary | ICD-10-CM | POA: Diagnosis not present

## 2013-07-07 DIAGNOSIS — F259 Schizoaffective disorder, unspecified: Secondary | ICD-10-CM | POA: Diagnosis not present

## 2013-07-07 DIAGNOSIS — IMO0001 Reserved for inherently not codable concepts without codable children: Secondary | ICD-10-CM | POA: Diagnosis not present

## 2013-07-08 DIAGNOSIS — M159 Polyosteoarthritis, unspecified: Secondary | ICD-10-CM | POA: Diagnosis not present

## 2013-07-08 DIAGNOSIS — G3184 Mild cognitive impairment, so stated: Secondary | ICD-10-CM | POA: Diagnosis not present

## 2013-07-08 DIAGNOSIS — F259 Schizoaffective disorder, unspecified: Secondary | ICD-10-CM | POA: Diagnosis not present

## 2013-07-08 DIAGNOSIS — R1310 Dysphagia, unspecified: Secondary | ICD-10-CM | POA: Diagnosis not present

## 2013-07-08 DIAGNOSIS — E785 Hyperlipidemia, unspecified: Secondary | ICD-10-CM | POA: Diagnosis not present

## 2013-07-08 DIAGNOSIS — M1710 Unilateral primary osteoarthritis, unspecified knee: Secondary | ICD-10-CM | POA: Diagnosis not present

## 2013-07-08 DIAGNOSIS — E119 Type 2 diabetes mellitus without complications: Secondary | ICD-10-CM | POA: Diagnosis not present

## 2013-07-08 DIAGNOSIS — F0280 Dementia in other diseases classified elsewhere without behavioral disturbance: Secondary | ICD-10-CM | POA: Diagnosis not present

## 2013-07-08 DIAGNOSIS — D649 Anemia, unspecified: Secondary | ICD-10-CM | POA: Diagnosis not present

## 2013-07-08 DIAGNOSIS — IMO0001 Reserved for inherently not codable concepts without codable children: Secondary | ICD-10-CM | POA: Diagnosis not present

## 2013-07-08 DIAGNOSIS — I10 Essential (primary) hypertension: Secondary | ICD-10-CM | POA: Diagnosis not present

## 2013-07-09 ENCOUNTER — Encounter (INDEPENDENT_AMBULATORY_CARE_PROVIDER_SITE_OTHER): Payer: Medicare Other | Admitting: Ophthalmology

## 2013-07-09 DIAGNOSIS — F0280 Dementia in other diseases classified elsewhere without behavioral disturbance: Secondary | ICD-10-CM | POA: Diagnosis not present

## 2013-07-09 DIAGNOSIS — H3509 Other intraretinal microvascular abnormalities: Secondary | ICD-10-CM | POA: Diagnosis not present

## 2013-07-09 DIAGNOSIS — H3530 Unspecified macular degeneration: Secondary | ICD-10-CM

## 2013-07-09 DIAGNOSIS — G3184 Mild cognitive impairment, so stated: Secondary | ICD-10-CM | POA: Diagnosis not present

## 2013-07-09 DIAGNOSIS — I10 Essential (primary) hypertension: Secondary | ICD-10-CM

## 2013-07-09 DIAGNOSIS — H43819 Vitreous degeneration, unspecified eye: Secondary | ICD-10-CM

## 2013-07-09 DIAGNOSIS — F259 Schizoaffective disorder, unspecified: Secondary | ICD-10-CM | POA: Diagnosis not present

## 2013-07-09 DIAGNOSIS — H35039 Hypertensive retinopathy, unspecified eye: Secondary | ICD-10-CM | POA: Diagnosis not present

## 2013-07-09 DIAGNOSIS — IMO0001 Reserved for inherently not codable concepts without codable children: Secondary | ICD-10-CM | POA: Diagnosis not present

## 2013-07-10 DIAGNOSIS — G3184 Mild cognitive impairment, so stated: Secondary | ICD-10-CM | POA: Diagnosis not present

## 2013-07-10 DIAGNOSIS — IMO0001 Reserved for inherently not codable concepts without codable children: Secondary | ICD-10-CM | POA: Diagnosis not present

## 2013-07-10 DIAGNOSIS — F0280 Dementia in other diseases classified elsewhere without behavioral disturbance: Secondary | ICD-10-CM | POA: Diagnosis not present

## 2013-07-10 DIAGNOSIS — I10 Essential (primary) hypertension: Secondary | ICD-10-CM | POA: Diagnosis not present

## 2013-07-10 DIAGNOSIS — F259 Schizoaffective disorder, unspecified: Secondary | ICD-10-CM | POA: Diagnosis not present

## 2013-07-13 DIAGNOSIS — I10 Essential (primary) hypertension: Secondary | ICD-10-CM | POA: Diagnosis not present

## 2013-07-13 DIAGNOSIS — F0280 Dementia in other diseases classified elsewhere without behavioral disturbance: Secondary | ICD-10-CM | POA: Diagnosis not present

## 2013-07-13 DIAGNOSIS — IMO0001 Reserved for inherently not codable concepts without codable children: Secondary | ICD-10-CM | POA: Diagnosis not present

## 2013-07-13 DIAGNOSIS — F259 Schizoaffective disorder, unspecified: Secondary | ICD-10-CM | POA: Diagnosis not present

## 2013-07-13 DIAGNOSIS — G3184 Mild cognitive impairment, so stated: Secondary | ICD-10-CM | POA: Diagnosis not present

## 2013-07-14 ENCOUNTER — Other Ambulatory Visit: Payer: Self-pay | Admitting: Dermatology

## 2013-07-14 DIAGNOSIS — I10 Essential (primary) hypertension: Secondary | ICD-10-CM | POA: Diagnosis not present

## 2013-07-14 DIAGNOSIS — F0280 Dementia in other diseases classified elsewhere without behavioral disturbance: Secondary | ICD-10-CM | POA: Diagnosis not present

## 2013-07-14 DIAGNOSIS — G3184 Mild cognitive impairment, so stated: Secondary | ICD-10-CM | POA: Diagnosis not present

## 2013-07-14 DIAGNOSIS — IMO0001 Reserved for inherently not codable concepts without codable children: Secondary | ICD-10-CM | POA: Diagnosis not present

## 2013-07-14 DIAGNOSIS — D0460 Carcinoma in situ of skin of unspecified upper limb, including shoulder: Secondary | ICD-10-CM | POA: Diagnosis not present

## 2013-07-14 DIAGNOSIS — F259 Schizoaffective disorder, unspecified: Secondary | ICD-10-CM | POA: Diagnosis not present

## 2013-07-15 DIAGNOSIS — IMO0001 Reserved for inherently not codable concepts without codable children: Secondary | ICD-10-CM | POA: Diagnosis not present

## 2013-07-15 DIAGNOSIS — F259 Schizoaffective disorder, unspecified: Secondary | ICD-10-CM | POA: Diagnosis not present

## 2013-07-15 DIAGNOSIS — G3184 Mild cognitive impairment, so stated: Secondary | ICD-10-CM | POA: Diagnosis not present

## 2013-07-15 DIAGNOSIS — F0280 Dementia in other diseases classified elsewhere without behavioral disturbance: Secondary | ICD-10-CM | POA: Diagnosis not present

## 2013-07-15 DIAGNOSIS — I10 Essential (primary) hypertension: Secondary | ICD-10-CM | POA: Diagnosis not present

## 2013-07-16 ENCOUNTER — Other Ambulatory Visit: Payer: Self-pay

## 2013-07-16 DIAGNOSIS — F0280 Dementia in other diseases classified elsewhere without behavioral disturbance: Secondary | ICD-10-CM | POA: Diagnosis not present

## 2013-07-16 DIAGNOSIS — IMO0001 Reserved for inherently not codable concepts without codable children: Secondary | ICD-10-CM | POA: Diagnosis not present

## 2013-07-16 DIAGNOSIS — I10 Essential (primary) hypertension: Secondary | ICD-10-CM | POA: Diagnosis not present

## 2013-07-16 DIAGNOSIS — G3184 Mild cognitive impairment, so stated: Secondary | ICD-10-CM | POA: Diagnosis not present

## 2013-07-16 DIAGNOSIS — F259 Schizoaffective disorder, unspecified: Secondary | ICD-10-CM | POA: Diagnosis not present

## 2013-07-17 DIAGNOSIS — G3184 Mild cognitive impairment, so stated: Secondary | ICD-10-CM | POA: Diagnosis not present

## 2013-07-17 DIAGNOSIS — I10 Essential (primary) hypertension: Secondary | ICD-10-CM | POA: Diagnosis not present

## 2013-07-17 DIAGNOSIS — F0280 Dementia in other diseases classified elsewhere without behavioral disturbance: Secondary | ICD-10-CM | POA: Diagnosis not present

## 2013-07-17 DIAGNOSIS — F259 Schizoaffective disorder, unspecified: Secondary | ICD-10-CM | POA: Diagnosis not present

## 2013-07-17 DIAGNOSIS — IMO0001 Reserved for inherently not codable concepts without codable children: Secondary | ICD-10-CM | POA: Diagnosis not present

## 2013-07-20 DIAGNOSIS — G3184 Mild cognitive impairment, so stated: Secondary | ICD-10-CM | POA: Diagnosis not present

## 2013-07-20 DIAGNOSIS — F259 Schizoaffective disorder, unspecified: Secondary | ICD-10-CM | POA: Diagnosis not present

## 2013-07-20 DIAGNOSIS — I10 Essential (primary) hypertension: Secondary | ICD-10-CM | POA: Diagnosis not present

## 2013-07-20 DIAGNOSIS — F0280 Dementia in other diseases classified elsewhere without behavioral disturbance: Secondary | ICD-10-CM | POA: Diagnosis not present

## 2013-07-20 DIAGNOSIS — IMO0001 Reserved for inherently not codable concepts without codable children: Secondary | ICD-10-CM | POA: Diagnosis not present

## 2013-07-21 DIAGNOSIS — IMO0001 Reserved for inherently not codable concepts without codable children: Secondary | ICD-10-CM | POA: Diagnosis not present

## 2013-07-21 DIAGNOSIS — F259 Schizoaffective disorder, unspecified: Secondary | ICD-10-CM | POA: Diagnosis not present

## 2013-07-21 DIAGNOSIS — F0280 Dementia in other diseases classified elsewhere without behavioral disturbance: Secondary | ICD-10-CM | POA: Diagnosis not present

## 2013-07-21 DIAGNOSIS — I10 Essential (primary) hypertension: Secondary | ICD-10-CM | POA: Diagnosis not present

## 2013-07-21 DIAGNOSIS — G3184 Mild cognitive impairment, so stated: Secondary | ICD-10-CM | POA: Diagnosis not present

## 2013-07-22 DIAGNOSIS — F0280 Dementia in other diseases classified elsewhere without behavioral disturbance: Secondary | ICD-10-CM | POA: Diagnosis not present

## 2013-07-22 DIAGNOSIS — F259 Schizoaffective disorder, unspecified: Secondary | ICD-10-CM | POA: Diagnosis not present

## 2013-07-22 DIAGNOSIS — IMO0001 Reserved for inherently not codable concepts without codable children: Secondary | ICD-10-CM | POA: Diagnosis not present

## 2013-07-22 DIAGNOSIS — I10 Essential (primary) hypertension: Secondary | ICD-10-CM | POA: Diagnosis not present

## 2013-07-22 DIAGNOSIS — G3184 Mild cognitive impairment, so stated: Secondary | ICD-10-CM | POA: Diagnosis not present

## 2013-07-23 DIAGNOSIS — I10 Essential (primary) hypertension: Secondary | ICD-10-CM | POA: Diagnosis not present

## 2013-07-23 DIAGNOSIS — IMO0001 Reserved for inherently not codable concepts without codable children: Secondary | ICD-10-CM | POA: Diagnosis not present

## 2013-07-23 DIAGNOSIS — F259 Schizoaffective disorder, unspecified: Secondary | ICD-10-CM | POA: Diagnosis not present

## 2013-07-23 DIAGNOSIS — F0280 Dementia in other diseases classified elsewhere without behavioral disturbance: Secondary | ICD-10-CM | POA: Diagnosis not present

## 2013-07-23 DIAGNOSIS — G3184 Mild cognitive impairment, so stated: Secondary | ICD-10-CM | POA: Diagnosis not present

## 2013-07-24 DIAGNOSIS — IMO0001 Reserved for inherently not codable concepts without codable children: Secondary | ICD-10-CM | POA: Diagnosis not present

## 2013-07-24 DIAGNOSIS — G3184 Mild cognitive impairment, so stated: Secondary | ICD-10-CM | POA: Diagnosis not present

## 2013-07-24 DIAGNOSIS — F259 Schizoaffective disorder, unspecified: Secondary | ICD-10-CM | POA: Diagnosis not present

## 2013-07-24 DIAGNOSIS — I10 Essential (primary) hypertension: Secondary | ICD-10-CM | POA: Diagnosis not present

## 2013-07-24 DIAGNOSIS — F0280 Dementia in other diseases classified elsewhere without behavioral disturbance: Secondary | ICD-10-CM | POA: Diagnosis not present

## 2013-07-27 DIAGNOSIS — F29 Unspecified psychosis not due to a substance or known physiological condition: Secondary | ICD-10-CM | POA: Diagnosis not present

## 2013-07-27 DIAGNOSIS — F03918 Unspecified dementia, unspecified severity, with other behavioral disturbance: Secondary | ICD-10-CM | POA: Diagnosis not present

## 2013-07-27 DIAGNOSIS — F329 Major depressive disorder, single episode, unspecified: Secondary | ICD-10-CM | POA: Diagnosis not present

## 2013-07-27 DIAGNOSIS — F0391 Unspecified dementia with behavioral disturbance: Secondary | ICD-10-CM | POA: Diagnosis not present

## 2013-07-27 DIAGNOSIS — F3289 Other specified depressive episodes: Secondary | ICD-10-CM | POA: Diagnosis not present

## 2013-07-29 DIAGNOSIS — F0391 Unspecified dementia with behavioral disturbance: Secondary | ICD-10-CM | POA: Diagnosis not present

## 2013-07-29 DIAGNOSIS — F29 Unspecified psychosis not due to a substance or known physiological condition: Secondary | ICD-10-CM | POA: Diagnosis not present

## 2013-07-29 DIAGNOSIS — F329 Major depressive disorder, single episode, unspecified: Secondary | ICD-10-CM | POA: Diagnosis not present

## 2013-07-29 DIAGNOSIS — F3289 Other specified depressive episodes: Secondary | ICD-10-CM | POA: Diagnosis not present

## 2013-07-29 DIAGNOSIS — F03918 Unspecified dementia, unspecified severity, with other behavioral disturbance: Secondary | ICD-10-CM | POA: Diagnosis not present

## 2013-08-10 ENCOUNTER — Encounter (HOSPITAL_BASED_OUTPATIENT_CLINIC_OR_DEPARTMENT_OTHER): Payer: Medicare Other | Attending: Plastic Surgery

## 2013-08-10 DIAGNOSIS — Z79899 Other long term (current) drug therapy: Secondary | ICD-10-CM | POA: Insufficient documentation

## 2013-08-10 DIAGNOSIS — Z794 Long term (current) use of insulin: Secondary | ICD-10-CM | POA: Insufficient documentation

## 2013-08-10 DIAGNOSIS — E785 Hyperlipidemia, unspecified: Secondary | ICD-10-CM | POA: Diagnosis not present

## 2013-08-10 DIAGNOSIS — L98499 Non-pressure chronic ulcer of skin of other sites with unspecified severity: Secondary | ICD-10-CM | POA: Insufficient documentation

## 2013-08-10 DIAGNOSIS — I10 Essential (primary) hypertension: Secondary | ICD-10-CM | POA: Insufficient documentation

## 2013-08-10 DIAGNOSIS — E669 Obesity, unspecified: Secondary | ICD-10-CM | POA: Insufficient documentation

## 2013-08-10 DIAGNOSIS — E119 Type 2 diabetes mellitus without complications: Secondary | ICD-10-CM | POA: Insufficient documentation

## 2013-08-10 DIAGNOSIS — Z7982 Long term (current) use of aspirin: Secondary | ICD-10-CM | POA: Insufficient documentation

## 2013-08-11 NOTE — Consult Note (Signed)
NAMESNIGDHA, HOWSER              ACCOUNT NO.:  [id number]  MEDICAL RECORD NO.:  [id number]  LOCATION:  FOOT                         FACILITY:  [facility]  PHYSICIAN:  Irene Limbo, MD   DATE OF BIRTH:  10-19-1927  DATE OF CONSULTATION:  08/10/2013 DATE OF DISCHARGE:                                CONSULTATION   CHIEF COMPLAINT:  Buttock ulcerations.  HISTORY OF PRESENT ILLNESS:  The patient is an 80 year old female that is minimally ambulatory that is currently living in facility called [facility].  She is accompanied by her daughter today who reports that she sleeps in a regular hospital bed, and during the day time, she walks with the aid of a walker. However she does require assist with getting out of her wheelchair.  She spends the majority of her day in her wheelchair.  She does not have a Roho cushion.  This is her first pressure ulceration.  The patient is sensate in the area.  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  PAST MEDICAL HISTORY:  Includes paranoid schizophrenia, history of fungal dermatitis intertrigo, hyperlipidemia, type 2 diabetes mellitus, obesity, hypertension, anemia, macular degeneration.  PAST SURGICAL HISTORY:  Includes hysterectomy with bilateral salpingo- oophorectomy, cataract extraction.  MEDICATIONS:  Include aspirin, atorvastatin, calcium, Colace, donepezil, Lexapro, Flonase, Lantus, ketoconazole cream, metformin, multivitamin, nortriptyline, olanzapine, omeprazole, Pred-Forte ophthalmic, primidone, and vitamin C.  ALLERGIES:  Include ether.  LABORATORY:  Dated July 01, 2013, was obtained from [facility].  This includes white blood cell count of 6.6, hemoglobin 11.2, platelets of 201.  Hemoglobin A1c was 7.0.  We have ordered additional laboratories including prealbumin following today's visit.  PHYSICAL EXAMINATION:  GENERAL:  The  patient is alert and does respond appropriately to questioning. VITAL SIGNS:  Blood pressure is 153/54, pulse is 61, temperature is 97.4, blood glucose is 137.  Over her right buttocks, there is a stage II ulceration measured 0.5 x 0.1 x 0.1.  Over the left buttock, there is a stage II ulceration measured as 1 x 0.9 x 0.1, however the area for pressure injury stage I with dark discoloration of her buttocks is much larger over 10-15 cm in diameter.  ASSESSMENT:  Stage I and II ulceration of minimally ambulatory patient. We have written orders for a Roho cushion as well as low air loss mattress for home.  We wrote for additional laboratories including Prealbumin. The majority of our visit today was spent in counseling.  We discussed that the patient needs to reposition herself every 30 minutes as that is sufficient to develop ulceration. If she is unable to do this,  she will need the assistance of others to frequently reposition her.  We reviewed with the daughter that all of the areas of discoloration are secondary to pressure and she needs to be An advocate to ensure that the pressure relieving measures and frequent turning of the patient occur.  We discussed that increasing the amount of time the patient ambulates during the day is progress. However, even while the patient is sitting in her chair, she does need to reposition herself.  The patient does have enough strength to walk with a walker, so hopefully she can be encouraged to lift her bottom from the chair to relieve pressure more frequently.  We will plan for followup in 2 week's time to see the progress of the ordered studies.  We counseled the daughter that she should be able to see resolution in the discoloration of her buttocks as the pressure relieving methods are undertaken.  Twenty minutes was spent with the patient over half in counseling.          ______________________________ Irene Limbo, MD  MBA     BT/MEDQ  D:  08/10/2013  T:  08/11/2013  Job:  [id number]

## 2013-08-12 DIAGNOSIS — D649 Anemia, unspecified: Secondary | ICD-10-CM | POA: Diagnosis not present

## 2013-08-12 DIAGNOSIS — E785 Hyperlipidemia, unspecified: Secondary | ICD-10-CM | POA: Diagnosis not present

## 2013-08-12 DIAGNOSIS — I10 Essential (primary) hypertension: Secondary | ICD-10-CM | POA: Diagnosis not present

## 2013-08-24 DIAGNOSIS — E669 Obesity, unspecified: Secondary | ICD-10-CM | POA: Diagnosis not present

## 2013-08-24 DIAGNOSIS — L98499 Non-pressure chronic ulcer of skin of other sites with unspecified severity: Secondary | ICD-10-CM | POA: Diagnosis not present

## 2013-08-24 DIAGNOSIS — E119 Type 2 diabetes mellitus without complications: Secondary | ICD-10-CM | POA: Diagnosis not present

## 2013-08-24 DIAGNOSIS — E785 Hyperlipidemia, unspecified: Secondary | ICD-10-CM | POA: Diagnosis not present

## 2013-08-26 DIAGNOSIS — J811 Chronic pulmonary edema: Secondary | ICD-10-CM | POA: Diagnosis not present

## 2013-08-26 DIAGNOSIS — N390 Urinary tract infection, site not specified: Secondary | ICD-10-CM | POA: Diagnosis not present

## 2013-08-26 DIAGNOSIS — Z79899 Other long term (current) drug therapy: Secondary | ICD-10-CM | POA: Diagnosis not present

## 2013-08-26 NOTE — Progress Notes (Signed)
[facility]  NAME:  Debra Tran, Debra Tran                   ACCOUNT NO.:  MEDICAL RECORD NO.:  [id number]      DATE OF BIRTH:  [birth year]  PHYSICIAN:  Irene Limbo, MD    VISIT DATE:  08/24/2013                                  OFFICE VISIT   CHIEF COMPLAINT:  Follow up of buttock ulcerations.  HISTORY OF PRESENT ILLNESS:  The patient is an 80 year old female that is minimally ambulatory that is currently living in [facility].  She is accompanied by her daughter for followup of very large area of stage I pressure changes with a small stage II ulcers that were as noted at her last visit.  Low air loss and Roho cushion were ordered.  The patient's daughter reports that both of these have reportedly been ordered, but are still waiting to be delivered.  They note that the nurses state that there is no order to get the patient up every hour to relieve pressure from her bottom as was recommended at her last visit.  We will make this a formal order today.  Examination over her right buttock appears to be more of a skin tear over her left buttock.  There is a healed wound in the much larger area of discoloration consistent with stage I pressure injury has significantly improved.  There are no additional areas open and there is very minimal moisture.   ASSESSMENT:  We made again a formal order for getting the patient up hourly from her wheelchair while she is awake. We will continue with hydrocolloid for protection over bilateral buttocks, and will plan for followup in 1 month's time, per request of the patient's daughter to aid with any further orders required at her nursing facility.          ______________________________ Irene Limbo, MD     BT/MEDQ  D:  08/24/2013  T:  08/25/2013  Job:  [id number]

## 2013-09-08 ENCOUNTER — Ambulatory Visit (INDEPENDENT_AMBULATORY_CARE_PROVIDER_SITE_OTHER): Payer: Medicare Other

## 2013-09-08 VITALS — BP 161/96 | HR 68 | Resp 16

## 2013-09-08 DIAGNOSIS — E1149 Type 2 diabetes mellitus with other diabetic neurological complication: Secondary | ICD-10-CM | POA: Diagnosis not present

## 2013-09-08 DIAGNOSIS — E1140 Type 2 diabetes mellitus with diabetic neuropathy, unspecified: Secondary | ICD-10-CM

## 2013-09-08 DIAGNOSIS — M129 Arthropathy, unspecified: Secondary | ICD-10-CM

## 2013-09-08 DIAGNOSIS — L608 Other nail disorders: Secondary | ICD-10-CM | POA: Diagnosis not present

## 2013-09-08 DIAGNOSIS — E1142 Type 2 diabetes mellitus with diabetic polyneuropathy: Secondary | ICD-10-CM

## 2013-09-08 NOTE — Patient Instructions (Signed)
Diabetes and Foot Care Diabetes may cause you to have problems because of poor blood supply (circulation) to your feet and legs. This may cause the skin on your feet to become thinner, break easier, and heal more slowly. Your skin may become dry, and the skin may peel and crack. You may also have nerve damage in your legs and feet causing decreased feeling in them. You may not notice minor injuries to your feet that could lead to infections or more serious problems. Taking care of your feet is one of the most important things you can do for yourself.  HOME CARE INSTRUCTIONS  Wear shoes at all times, even in the house. Do not go barefoot. Bare feet are easily injured.  Check your feet daily for blisters, cuts, and redness. If you cannot see the bottom of your feet, use a mirror or ask someone for help.  Wash your feet with warm water (do not use hot water) and mild soap. Then pat your feet and the areas between your toes until they are completely dry. Do not soak your feet as this can dry your skin.  Apply a moisturizing lotion or petroleum jelly (that does not contain alcohol and is unscented) to the skin on your feet and to dry, brittle toenails. Do not apply lotion between your toes.  Trim your toenails straight across. Do not dig under them or around the cuticle. File the edges of your nails with an emery board or nail file.  Do not cut corns or calluses or try to remove them with medicine.  Wear clean socks or stockings every day. Make sure they are not too tight. Do not wear knee-high stockings since they may decrease blood flow to your legs.  Wear shoes that fit properly and have enough cushioning. To break in new shoes, wear them for just a few hours a day. This prevents you from injuring your feet. Always look in your shoes before you put them on to be sure there are no objects inside.  Do not cross your legs. This may decrease the blood flow to your feet.  If you find a minor scrape,  cut, or break in the skin on your feet, keep it and the skin around it clean and dry. These areas may be cleansed with mild soap and water. Do not cleanse the area with peroxide, alcohol, or iodine.  When you remove an adhesive bandage, be sure not to damage the skin around it.  If you have a wound, look at it several times a day to make sure it is healing.  Do not use heating pads or hot water bottles. They may burn your skin. If you have lost feeling in your feet or legs, you may not know it is happening until it is too late.  Make sure your health care provider performs a complete foot exam at least annually or more often if you have foot problems. Report any cuts, sores, or bruises to your health care provider immediately. SEEK MEDICAL CARE IF:   You have an injury that is not healing.  You have cuts or breaks in the skin.  You have an ingrown nail.  You notice redness on your legs or feet.  You feel burning or tingling in your legs or feet.  You have pain or cramps in your legs and feet.  Your legs or feet are numb.  Your feet always feel cold. SEEK IMMEDIATE MEDICAL CARE IF:   There is increasing redness,   swelling, or pain in or around a wound.  There is a red line that goes up your leg.  Pus is coming from a wound.  You develop a fever or as directed by your health care provider.  You notice a bad smell coming from an ulcer or wound. Document Released: 03/23/2000 Document Revised: 11/26/2012 Document Reviewed: 09/02/2012 [employer] Patient Information 2014 [employer]  

## 2013-09-08 NOTE — Progress Notes (Signed)
   Subjective:    Patient ID: Debra Tran, female    DOB: 11-24-1927, 80 y.o.   MRN: [id number]  HPI Comments: "She needs to get her toenails cut"     Review of Systems no new findings or significant systemic change     Objective:   Physical Exam Neurovascular status is intact and unchanged pedal pulses are palpable DP and PT plus one over 4 PT is somewhat thready diminished absent hair growth diminished skin texture noted neurologically epicritic and proprioceptive sensations intact although diminished on Semmes Weinstein testing to the digits and plantar forefoot and arch. Nails thick brittle crumbly friable 1 through 5 bilateral tender on palpation open wounds ulcerations no secondary infections.       Assessment & Plan:  Assessment diabetes with history peripheral neuropathy and angiopathy decreased epicritic sensations confirmed nails thick brittle crumbly friable dystrophic debrided x10 the presence of diabetes and complications return for future palliative diabetic foot nail care every 3 months as recommended next  Harriet Masson DPM

## 2013-09-21 ENCOUNTER — Encounter (HOSPITAL_BASED_OUTPATIENT_CLINIC_OR_DEPARTMENT_OTHER): Payer: Medicare Other | Attending: Plastic Surgery

## 2013-09-21 DIAGNOSIS — L8992 Pressure ulcer of unspecified site, stage 2: Secondary | ICD-10-CM | POA: Insufficient documentation

## 2013-09-21 DIAGNOSIS — L89309 Pressure ulcer of unspecified buttock, unspecified stage: Secondary | ICD-10-CM | POA: Insufficient documentation

## 2013-10-01 DIAGNOSIS — E1165 Type 2 diabetes mellitus with hyperglycemia: Secondary | ICD-10-CM | POA: Diagnosis not present

## 2013-10-01 DIAGNOSIS — E119 Type 2 diabetes mellitus without complications: Secondary | ICD-10-CM | POA: Diagnosis not present

## 2013-10-02 DIAGNOSIS — F329 Major depressive disorder, single episode, unspecified: Secondary | ICD-10-CM | POA: Diagnosis not present

## 2013-10-02 DIAGNOSIS — F3289 Other specified depressive episodes: Secondary | ICD-10-CM | POA: Diagnosis not present

## 2013-10-02 DIAGNOSIS — F03918 Unspecified dementia, unspecified severity, with other behavioral disturbance: Secondary | ICD-10-CM | POA: Diagnosis not present

## 2013-10-02 DIAGNOSIS — F0391 Unspecified dementia with behavioral disturbance: Secondary | ICD-10-CM | POA: Diagnosis not present

## 2013-10-02 DIAGNOSIS — F29 Unspecified psychosis not due to a substance or known physiological condition: Secondary | ICD-10-CM | POA: Diagnosis not present

## 2013-10-13 ENCOUNTER — Non-Acute Institutional Stay (SKILLED_NURSING_FACILITY): Payer: Medicare Other | Admitting: Internal Medicine

## 2013-10-13 DIAGNOSIS — D649 Anemia, unspecified: Secondary | ICD-10-CM | POA: Diagnosis not present

## 2013-10-13 DIAGNOSIS — I10 Essential (primary) hypertension: Secondary | ICD-10-CM | POA: Diagnosis not present

## 2013-10-13 DIAGNOSIS — F0390 Unspecified dementia without behavioral disturbance: Secondary | ICD-10-CM

## 2013-10-13 DIAGNOSIS — F3289 Other specified depressive episodes: Secondary | ICD-10-CM

## 2013-10-13 DIAGNOSIS — F329 Major depressive disorder, single episode, unspecified: Secondary | ICD-10-CM

## 2013-10-13 DIAGNOSIS — F209 Schizophrenia, unspecified: Secondary | ICD-10-CM

## 2013-10-13 DIAGNOSIS — F32A Depression, unspecified: Secondary | ICD-10-CM

## 2013-10-13 DIAGNOSIS — F03A0 Unspecified dementia, mild, without behavioral disturbance, psychotic disturbance, mood disturbance, and anxiety: Secondary | ICD-10-CM

## 2013-10-13 DIAGNOSIS — E785 Hyperlipidemia, unspecified: Secondary | ICD-10-CM

## 2013-10-13 NOTE — Progress Notes (Signed)
MRN: [id number] Name: Debra Tran  Sex: female Age: 80 y.o. DOB: 1927/08/30  [place] #: [facility] Facility/Room: 214A Level Of Care: SNF Provider: Inocencio Homes D Emergency Contacts: Extended Emergency Contact Information Primary Emergency Contact: Feldkamp,[name] Address: [address]          [place] Phone: [phone] Relation: Daughter  Code Status: FULL  Allergies: Ether  Chief Complaint  Patient presents with  . Medical Management of Chronic Issues    HPI: Patient is 80 y.o. female whose major problems are dementai and schizophrenia being seen for routine issues.  Past Medical History  Diagnosis Date  . Phobia   . Macular degeneration   . Narcissism   . Schizophrenia, paranoid   . Fungal dermatitis   . Hyperlipidemia   . Mild cognitive impairment   . Diabetes mellitus type 2 in obese   . Stress incontinence, female   . Allergic rhinitis   . Normocytic anemia   . Depressive disorder   . Hypertension     Past Surgical History  Procedure Laterality Date  . Cataract extraction  6 16 2006  . Cryotherapy  2 15 2006    facial AK  . Total abdominal hysterectomy w/ bilateral salpingoophorectomy      due to endometriosis  non malignant  . Hyperplastic   4 20 2006    AK shave biopsy  . Abdominal hysterectomy      due to fibroids      Medication List       This list is accurate as of: 10/13/13 11:59 PM.  Always use your most recent med list.               aspirin EC 81 MG tablet  Take 81 mg by mouth every morning.     atorvastatin 20 MG tablet  Commonly known as:  LIPITOR  Take 1 tablet (20 mg total) by mouth at bedtime.     CALCIUM 600-D 600-400 MG-UNIT per tablet  Generic drug:  Calcium Carbonate-Vitamin D  Take 1 tablet by mouth every morning.     clotrimazole 1 % cream  Commonly known as:  LOTRIMIN  Apply 1 application topically 2 (two) times daily.     Corn Cushions Pads  1 Device by Does not apply  route daily.     docusate sodium 100 MG capsule  Commonly known as:  COLACE  Take 100 mg by mouth 2 (two) times daily.     donepezil 5 MG tablet  Commonly known as:  ARICEPT  Take 10 mg by mouth at bedtime.     escitalopram 10 MG tablet  Commonly known as:  LEXAPRO  Take 20 mg by mouth at bedtime.     fluticasone 50 MCG/ACT nasal spray  Commonly known as:  FLONASE  Place 2 sprays into the nose every morning.     insulin glargine 100 UNIT/ML injection  Commonly known as:  LANTUS  Inject 10 Units into the skin at bedtime.     ketoconazole 2 % cream  Commonly known as:  NIZORAL  Apply 1 application topically 2 (two) times daily. Apply to groin and buttocks 2 times a day     metFORMIN 500 MG tablet  Commonly known as:  GLUCOPHAGE  Take 250 mg by mouth 2 (two) times daily with a meal.     multivitamin with minerals Tabs tablet  Take 1 tablet by mouth every morning.     nortriptyline 75 MG  capsule  Commonly known as:  PAMELOR  Take 75 mg by mouth at bedtime.     OLANZapine 5 MG tablet  Commonly known as:  ZYPREXA  Take 1 tablet (5 mg total) by mouth at bedtime.     omeprazole 20 MG capsule  Commonly known as:  PRILOSEC  Take 1 capsule (20 mg total) by mouth 2 (two) times daily.     prednisoLONE acetate 1 % ophthalmic suspension  Commonly known as:  PRED FORTE  1 drop 4 (four) times daily.     primidone 50 MG tablet  Commonly known as:  MYSOLINE  Take 25 mg by mouth at bedtime.     vitamin C 500 MG tablet  Commonly known as:  ASCORBIC ACID  Take 500 mg by mouth daily.        No orders of the defined types were placed in this encounter.    Immunization History  Administered Date(s) Administered  . Influenza Split 02/15/2011, 01/21/2012  . Influenza Whole 02/05/2007, 01/17/2010, 01/28/2013  . Pneumococcal Polysaccharide-23 04/10/1999  . Td 10/07/2001    History  Substance Use Topics  . Smoking status: Former Research scientist (life sciences)  . Smokeless tobacco: Not on file  .  Alcohol Use: No    Review of Systems  DATA OBTAINED: from patient; no c/o;limited by dementia and schizophrenia GENERAL: Feels well no fevers, fatigue, appetite changes SKIN: No itching, rash HEENT: No complaint RESPIRATORY: No cough, wheezing, SOB CARDIAC: No chest pain, palpitations, lower extremity edema  GI: No abdominal pain, No N/V/D or constipation, No heartburn or reflux  GU: No dysuria, frequency or urgency, or incontinence  MUSCULOSKELETAL: No unrelieved bone/joint pain NEUROLOGIC: No headache, dizziness or focal weakness PSYCHIATRIC:  Sleeps well.   Filed Vitals:   10/13/13 2316  BP: 144/65  Pulse: 70  Temp: 98.3 F (36.8 C)  Resp: 20    Physical Exam  GENERAL APPEARANCE: Alert, min conversant. Appropriately groomed. No acute distress  SKIN: No diaphoresis rash HEENT: Unremarkable RESPIRATORY: Breathing is even, unlabored. Lung sounds are clear   CARDIOVASCULAR: Heart RRR no murmurs, rubs or gallops. No peripheral edema  GASTROINTESTINAL: Abdomen is soft, non-tender, not distended w/ normal bowel sounds.  GENITOURINARY: Bladder non tender, not distended  MUSCULOSKELETAL: No abnormal joints or musculature NEUROLOGIC: Cranial nerves 2-12 grossly intact. Moves all extremities PSYCHIATRIC: odd affect, no behavioral issues  Patient Active Problem List   Diagnosis Date Noted  . Normocytic anemia   . Depressive disorder   . Hypertension   . Tinea corporis 10/15/2012  . Arch pain of left foot 10/15/2012  . Right ankle pain 03/04/2012  . Dementia 12/12/2011  . Weight loss 08/31/2011  . Vaginal odor 08/31/2011  . Repeated falls 03/12/2011  . Acid indigestion 06/15/2010  . FUNGAL DERMATITIS 03/14/2010  . ANEMIA, NORMOCYTIC 10/13/2009  . Mild dementia 10/13/2009  . DIZZINESS 10/13/2009  . GAIT DISTURBANCE 10/13/2009  . CORNS AND CALLOSITIES 09/20/2009  . ARTHRITIS 09/20/2009  . DIABETES MELLITUS II, UNCOMPLICATED 40/98/1191  . HYPERLIPIDEMIA 06/06/2006  .  OBESITY, NOS 06/06/2006  . SCHIZOPHRENIA 06/06/2006  . DEPRESSIVE DISORDER, NOS 06/06/2006  . CATARACT 06/06/2006  . HYPERTENSION, BENIGN SYSTEMIC 06/06/2006  . RHINITIS, ALLERGIC 06/06/2006  . INCONTINENCE, STRESS, FEMALE 06/06/2006     BMP  139, 4.3, 98, 27, 18.5/0.98 , 203    Assessment and Plan  HYPERTENSION, BENIGN SYSTEMIC BP in decent control on no meds  Mild dementia Maintaining on aricept. Pt has meds for psychosis  ANEMIA, NORMOCYTIC Stable 12.2/38.4;  Plt 235  SCHIZOPHRENIA Pt on zyprexa and mysoline and lexapro  HYPERLIPIDEMIA Lipitor 20 mg  Depressive disorder Maintained on lexapro and nortriptyline    Hennie Duos, MD

## 2013-10-18 ENCOUNTER — Encounter: Payer: Self-pay | Admitting: Internal Medicine

## 2013-10-18 NOTE — Assessment & Plan Note (Signed)
Stable 12.2/38.4; Plt 235

## 2013-10-18 NOTE — Assessment & Plan Note (Signed)
Maintaining on aricept. Pt has meds for psychosis

## 2013-10-18 NOTE — Assessment & Plan Note (Signed)
BP in decent control on no meds

## 2013-10-18 NOTE — Assessment & Plan Note (Signed)
Pt on zyprexa and mysoline and lexapro

## 2013-10-18 NOTE — Assessment & Plan Note (Signed)
Maintained on lexapro and nortriptyline

## 2013-10-18 NOTE — Assessment & Plan Note (Signed)
Lipitor 20mg

## 2013-10-19 ENCOUNTER — Encounter (HOSPITAL_BASED_OUTPATIENT_CLINIC_OR_DEPARTMENT_OTHER): Payer: Medicare Other | Attending: Plastic Surgery

## 2013-10-19 DIAGNOSIS — L89309 Pressure ulcer of unspecified buttock, unspecified stage: Secondary | ICD-10-CM | POA: Insufficient documentation

## 2013-10-19 DIAGNOSIS — L8991 Pressure ulcer of unspecified site, stage 1: Secondary | ICD-10-CM | POA: Insufficient documentation

## 2013-10-21 DIAGNOSIS — E785 Hyperlipidemia, unspecified: Secondary | ICD-10-CM | POA: Diagnosis not present

## 2013-10-21 DIAGNOSIS — D649 Anemia, unspecified: Secondary | ICD-10-CM | POA: Diagnosis not present

## 2013-10-21 DIAGNOSIS — I10 Essential (primary) hypertension: Secondary | ICD-10-CM | POA: Diagnosis not present

## 2013-10-21 DIAGNOSIS — E1165 Type 2 diabetes mellitus with hyperglycemia: Secondary | ICD-10-CM | POA: Diagnosis not present

## 2013-10-21 LAB — CBC AND DIFFERENTIAL
HCT: 35 % — AB (ref 36–46)
Hemoglobin: 11.6 g/dL — AB (ref 12.0–16.0)
Platelets: 249 10*3/uL (ref 150–399)
WBC: 8.1 10*3/mL

## 2013-10-21 LAB — BASIC METABOLIC PANEL
BUN: 19 mg/dL (ref 4–21)
Creatinine: 0.9 mg/dL (ref 0.5–1.1)
Glucose: 207 mg/dL
POTASSIUM: 4.2 mmol/L (ref 3.4–5.3)
SODIUM: 141 mmol/L (ref 137–147)

## 2013-10-21 LAB — LIPID PANEL
Cholesterol: 155 mg/dL (ref 0–200)
HDL: 56 mg/dL (ref 35–70)
LDL Cholesterol: 69 mg/dL
Triglycerides: 133 mg/dL (ref 40–160)

## 2013-10-21 LAB — HEMOGLOBIN A1C: Hgb A1c MFr Bld: 7.1 % — AB (ref 4.0–6.0)

## 2013-10-26 DIAGNOSIS — L8991 Pressure ulcer of unspecified site, stage 1: Secondary | ICD-10-CM | POA: Diagnosis not present

## 2013-10-26 DIAGNOSIS — L89309 Pressure ulcer of unspecified buttock, unspecified stage: Secondary | ICD-10-CM | POA: Diagnosis not present

## 2013-10-30 NOTE — Progress Notes (Signed)
[facility]  NAME:  Debra Tran, Debra Tran                   ACCOUNT NO.:  MEDICAL RECORD NO.:  [id number]      DATE OF BIRTH:  07-20-1927  PHYSICIAN:  Irene Limbo, MD    VISIT DATE:  10/26/2013                                  OFFICE VISIT   CHIEF COMPLAINT:  Followup of buttock ulceration.  HISTORY:  The patient is an 80 year old minimally ambulatory female who is living in [place].  She is accompanied by her daughter for followup of pressure ulceration of her buttock.  She presented with a very large area of stage I pressure ulcers.  We ordered a low air loss mattress and Roho cushion.  They reported that neither of these were ever obtained by facility. Her last wound care was hydrocolloid to prevent peri-wound maceration and collagen to the remaining open wound.  She has also in the interim developed a fungal-type infection in her right groin, which she has had in the past.  PHYSICAL EXAMINATION:  Reveals healed right buttock wound.  Vitals: Blood pressure is 154/79, pulse is 76, temperature is 97.9.  Over her right groin, she does have some area of rash consistent with yeast infection.  ASSESSMENT:  We will consider her buttock wound healed. Given her high risk and inability to obtain a Roho or low air loss mattress at her facility, recommend continued hydrocolloid and encouraged the daughter to examine the wound weekly herself.  She will return as needed for any concerns.  We did also recommend ketoconazole or Lotrimin to the right groin for 1 week's time.  We will plan discharge from the clinic.          ______________________________ Irene Limbo, MD     BT/MEDQ  D:  10/26/2013  T:  10/27/2013  Job:  [id number]

## 2013-11-11 DIAGNOSIS — F29 Unspecified psychosis not due to a substance or known physiological condition: Secondary | ICD-10-CM | POA: Diagnosis not present

## 2013-11-11 DIAGNOSIS — F0391 Unspecified dementia with behavioral disturbance: Secondary | ICD-10-CM | POA: Diagnosis not present

## 2013-11-11 DIAGNOSIS — F3289 Other specified depressive episodes: Secondary | ICD-10-CM | POA: Diagnosis not present

## 2013-11-11 DIAGNOSIS — F329 Major depressive disorder, single episode, unspecified: Secondary | ICD-10-CM | POA: Diagnosis not present

## 2013-11-11 DIAGNOSIS — F03918 Unspecified dementia, unspecified severity, with other behavioral disturbance: Secondary | ICD-10-CM | POA: Diagnosis not present

## 2013-12-08 ENCOUNTER — Ambulatory Visit: Payer: Medicare Other

## 2013-12-14 ENCOUNTER — Encounter: Payer: Self-pay | Admitting: Internal Medicine

## 2013-12-29 ENCOUNTER — Ambulatory Visit: Payer: Medicare Other

## 2013-12-31 DIAGNOSIS — F259 Schizoaffective disorder, unspecified: Secondary | ICD-10-CM | POA: Diagnosis not present

## 2013-12-31 DIAGNOSIS — F329 Major depressive disorder, single episode, unspecified: Secondary | ICD-10-CM | POA: Diagnosis not present

## 2013-12-31 DIAGNOSIS — F03918 Unspecified dementia, unspecified severity, with other behavioral disturbance: Secondary | ICD-10-CM | POA: Diagnosis not present

## 2013-12-31 DIAGNOSIS — F3289 Other specified depressive episodes: Secondary | ICD-10-CM | POA: Diagnosis not present

## 2013-12-31 DIAGNOSIS — F0391 Unspecified dementia with behavioral disturbance: Secondary | ICD-10-CM | POA: Diagnosis not present

## 2014-01-06 ENCOUNTER — Ambulatory Visit (INDEPENDENT_AMBULATORY_CARE_PROVIDER_SITE_OTHER): Payer: Medicare Other

## 2014-01-06 VITALS — BP 148/64 | HR 86 | Resp 12

## 2014-01-06 DIAGNOSIS — E1140 Type 2 diabetes mellitus with diabetic neuropathy, unspecified: Secondary | ICD-10-CM

## 2014-01-06 DIAGNOSIS — M129 Arthropathy, unspecified: Secondary | ICD-10-CM

## 2014-01-06 DIAGNOSIS — E1149 Type 2 diabetes mellitus with other diabetic neurological complication: Secondary | ICD-10-CM | POA: Diagnosis not present

## 2014-01-06 DIAGNOSIS — L608 Other nail disorders: Secondary | ICD-10-CM | POA: Diagnosis not present

## 2014-01-06 DIAGNOSIS — E1142 Type 2 diabetes mellitus with diabetic polyneuropathy: Secondary | ICD-10-CM

## 2014-01-06 NOTE — Progress Notes (Signed)
   Subjective:    Patient ID: GUDELIA EUGENE, female    DOB: 07-06-1927, 80 y.o.   MRN: [id number]  HPI  Toenails trim  Review of Systems no new findings or systemic changes noted     Objective:   Physical Exam Neurovascular status unchanged pulses DP and PT one over 4 capillary fill time 3 seconds absent hair growth and skin texture turgor decreased epicritic sensation confirmed plantar response DTRs not elicited nails thick brittle crumbly friable dystrophic most significant bilateral hallux on debridement left great toe treated nailbed with lumicain and Neosporin no active infection no discharge or drainage noted no open wounds or ulcers noted mild digital contractures hammertoe deformities noted mild arthropathy the MTP joints with reduced range of motion. Agent isn't inflammatory a wheelchair for transport.       Assessment & Plan:  Assessment diabetes with history peripheral neuropathy and angiopathy with decreased sensations confirmed thick brittle crumbly friable criptotic nails debrided x10 return for future palliative care in 3 months debrided nails and put Neosporin first left no pain or discomfort at this time. No signs of infection that  Harriet Masson DPM

## 2014-01-06 NOTE — Patient Instructions (Signed)
Diabetes and Foot Care Diabetes may cause you to have problems because of poor blood supply (circulation) to your feet and legs. This may cause the skin on your feet to become thinner, break easier, and heal more slowly. Your skin may become dry, and the skin may peel and crack. You may also have nerve damage in your legs and feet causing decreased feeling in them. You may not notice minor injuries to your feet that could lead to infections or more serious problems. Taking care of your feet is one of the most important things you can do for yourself.  HOME CARE INSTRUCTIONS  Wear shoes at all times, even in the house. Do not go barefoot. Bare feet are easily injured.  Check your feet daily for blisters, cuts, and redness. If you cannot see the bottom of your feet, use a mirror or ask someone for help.  Wash your feet with warm water (do not use hot water) and mild soap. Then pat your feet and the areas between your toes until they are completely dry. Do not soak your feet as this can dry your skin.  Apply a moisturizing lotion or petroleum jelly (that does not contain alcohol and is unscented) to the skin on your feet and to dry, brittle toenails. Do not apply lotion between your toes.  Trim your toenails straight across. Do not dig under them or around the cuticle. File the edges of your nails with an emery board or nail file.  Do not cut corns or calluses or try to remove them with medicine.  Wear clean socks or stockings every day. Make sure they are not too tight. Do not wear knee-high stockings since they may decrease blood flow to your legs.  Wear shoes that fit properly and have enough cushioning. To break in new shoes, wear them for just a few hours a day. This prevents you from injuring your feet. Always look in your shoes before you put them on to be sure there are no objects inside.  Do not cross your legs. This may decrease the blood flow to your feet.  If you find a minor scrape,  cut, or break in the skin on your feet, keep it and the skin around it clean and dry. These areas may be cleansed with mild soap and water. Do not cleanse the area with peroxide, alcohol, or iodine.  When you remove an adhesive bandage, be sure not to damage the skin around it.  If you have a wound, look at it several times a day to make sure it is healing.  Do not use heating pads or hot water bottles. They may burn your skin. If you have lost feeling in your feet or legs, you may not know it is happening until it is too late.  Make sure your health care provider performs a complete foot exam at least annually or more often if you have foot problems. Report any cuts, sores, or bruises to your health care provider immediately. SEEK MEDICAL CARE IF:   You have an injury that is not healing.  You have cuts or breaks in the skin.  You have an ingrown nail.  You notice redness on your legs or feet.  You feel burning or tingling in your legs or feet.  You have pain or cramps in your legs and feet.  Your legs or feet are numb.  Your feet always feel cold. SEEK IMMEDIATE MEDICAL CARE IF:   There is increasing redness,   swelling, or pain in or around a wound.  There is a red line that goes up your leg.  Pus is coming from a wound.  You develop a fever or as directed by your health care provider.  You notice a bad smell coming from an ulcer or wound. Document Released: 03/23/2000 Document Revised: 11/26/2012 Document Reviewed: 09/02/2012 ExitCare Patient Information 2015 [employer]. This information is not intended to replace advice given to you by your health care provider. Make sure you discuss any questions you have with your health care provider.  

## 2014-01-22 ENCOUNTER — Other Ambulatory Visit: Payer: Self-pay

## 2014-01-27 ENCOUNTER — Non-Acute Institutional Stay (SKILLED_NURSING_FACILITY): Payer: Medicare Other | Admitting: Internal Medicine

## 2014-01-27 ENCOUNTER — Encounter: Payer: Self-pay | Admitting: Internal Medicine

## 2014-01-27 DIAGNOSIS — F209 Schizophrenia, unspecified: Secondary | ICD-10-CM | POA: Diagnosis not present

## 2014-01-27 DIAGNOSIS — I10 Essential (primary) hypertension: Secondary | ICD-10-CM | POA: Diagnosis not present

## 2014-01-27 DIAGNOSIS — K30 Functional dyspepsia: Secondary | ICD-10-CM

## 2014-01-27 DIAGNOSIS — E119 Type 2 diabetes mellitus without complications: Secondary | ICD-10-CM

## 2014-01-27 DIAGNOSIS — R1013 Epigastric pain: Secondary | ICD-10-CM

## 2014-01-27 DIAGNOSIS — E1169 Type 2 diabetes mellitus with other specified complication: Secondary | ICD-10-CM

## 2014-01-27 DIAGNOSIS — E785 Hyperlipidemia, unspecified: Secondary | ICD-10-CM

## 2014-01-27 DIAGNOSIS — F0390 Unspecified dementia without behavioral disturbance: Secondary | ICD-10-CM | POA: Diagnosis not present

## 2014-01-27 MED ORDER — METFORMIN HCL 500 MG PO TABS: 1000.0000 mg | ORAL_TABLET | Freq: Two times a day (BID) | ORAL | Status: DC

## 2014-01-27 NOTE — Progress Notes (Signed)
Patient ID: Debra Tran, female   DOB: February 03, 1928, 80 y.o.   MRN: [id number]  Location:  [facility] Provider:  Rexene Edison. Mariea Clonts, D.O., C.M.D.  Code Status:  Full code   Chief Complaint  Patient presents with  . Medical Management of Chronic Issues    HPI:  80 yo white female long term care resident with schizoaffective disorder, dementia, DMII, htn, hyperlipidemia seen for medical mgt of chronic diseases.  She had no complaints.  She was wanting to get back to her bingo game.  Review of Systems:  Review of Systems  Constitutional: Negative for fever, chills and weight loss.  Respiratory: Negative for shortness of breath.   Cardiovascular: Negative for chest pain and leg swelling.  Gastrointestinal: Negative for constipation, blood in stool and melena.  Genitourinary: Negative for dysuria.  Musculoskeletal: Negative for falls.  Neurological: Negative for dizziness.  Endo/Heme/Allergies: Bruises/bleeds easily.  Psychiatric/Behavioral: Positive for depression and memory loss.    Medications: Patient's Medications  New Prescriptions   No medications on file  Previous Medications   ASPIRIN EC 81 MG TABLET    Take 81 mg by mouth every morning.    ATORVASTATIN (LIPITOR) 20 MG TABLET    Take 1 tablet (20 mg total) by mouth at bedtime.   CALCIUM CARBONATE-VITAMIN D (CALCIUM 600-D) 600-400 MG-UNIT PER TABLET    Take 1 tablet by mouth every morning.    DOCUSATE SODIUM (COLACE) 100 MG CAPSULE    Take 100 mg by mouth 2 (two) times daily.   DONEPEZIL (ARICEPT) 5 MG TABLET    Take 10 mg by mouth at bedtime.    ESCITALOPRAM (LEXAPRO) 10 MG TABLET    Take 20 mg by mouth at bedtime.    FLUTICASONE (FLONASE) 50 MCG/ACT NASAL SPRAY    Place 2 sprays into the nose every morning.    INSULIN GLARGINE (LANTUS) 100 UNIT/ML INJECTION    Inject 10 Units into the skin at bedtime.   INSULIN LISPRO (HUMALOG) 100 UNIT/ML INJECTION    Inject 5 Units into the skin 3 (three) times daily  before meals.   INSULIN LISPRO (HUMALOG) 100 UNIT/ML INJECTION    Inject 5 Units into the skin 3 (three) times daily before meals. As needed if CBG over 150 (in additional to standard 5 units meal coverage)   METFORMIN (GLUCOPHAGE) 500 MG TABLET    Take 500 mg by mouth 2 (two) times daily with a meal.    MULTIPLE VITAMIN (MULTIVITAMIN WITH MINERALS) TABS    Take 1 tablet by mouth every morning.   NORTRIPTYLINE (PAMELOR) 75 MG CAPSULE    Take 75 mg by mouth at bedtime.   OMEPRAZOLE (PRILOSEC) 20 MG CAPSULE    Take 1 capsule (20 mg total) by mouth 2 (two) times daily.   PRIMIDONE (MYSOLINE) 50 MG TABLET    Take 25 mg by mouth at bedtime.  Modified Medications   Modified Medication Previous Medication   OLANZAPINE (ZYPREXA) 5 MG TABLET OLANZapine (ZYPREXA) 5 MG tablet      Take 15 mg by mouth at bedtime.    Take 1 tablet (5 mg total) by mouth at bedtime.  Discontinued Medications   CLOTRIMAZOLE (LOTRIMIN) 1 % CREAM    Apply 1 application topically 2 (two) times daily.   FOOT CARE PRODUCTS (CORN CUSHIONS) PADS    1 Device by Does not apply route daily.   KETOCONAZOLE (NIZORAL) 2 % CREAM    Apply 1 application topically 2 (two) times daily.  Apply to groin and buttocks 2 times a day   PREDNISOLONE ACETATE (PRED FORTE) 1 % OPHTHALMIC SUSPENSION    1 drop 4 (four) times daily.   VITAMIN C (ASCORBIC ACID) 500 MG TABLET    Take 500 mg by mouth daily.    Physical Exam: Filed Vitals:   01/27/14 1152  BP: 132/76  Pulse: 76  Temp: 97.9 F (36.6 C)  Resp: 20  Height: 5' (1.524 m)  Weight: 184 lb (83.462 kg)  Physical Exam  Constitutional: She appears well-developed and well-nourished. No distress.  Obese white female  Cardiovascular: Normal rate, regular rhythm and normal heart sounds.   Pulmonary/Chest: Effort normal and breath sounds normal.  Musculoskeletal: Normal range of motion.  Neurological: She is alert.  Psychiatric:  Flat affect   Labs reviewed: Lab Results  Component Value Date     HGBA1C 7.1* 10/21/2013   10/21/13:  Rest abstracted, but Albumin 3.6, rest of liver panel normal  Assessment/Plan 1. Controlled diabetes mellitus type II without complication -increase metformin to max dose before further titrating insulin--if not effective any long, discontinue -f/u BMP and hba1c, urine microalbumin  2. Dementia, without behavioral disturbance -no changes needed, stable, still participating in activities  3. HYPERTENSION, BENIGN SYSTEMIC -bp at goal with current meds, cont to monitor  4. Schizophrenia, unspecified type -psych notes indicated schizoaffective disorder while epic chart says she's had ECT for her schizophrenia in the past  5. Hyperlipidemia associated with type 2 diabetes mellitus -lipids at goal on current therapy  6. Acid indigestion -cont omeprazole  Labs/tests ordered:  BMP and hba1c, urine microalbumin

## 2014-01-29 DIAGNOSIS — E1165 Type 2 diabetes mellitus with hyperglycemia: Secondary | ICD-10-CM | POA: Diagnosis not present

## 2014-01-29 DIAGNOSIS — Z79899 Other long term (current) drug therapy: Secondary | ICD-10-CM | POA: Diagnosis not present

## 2014-01-29 DIAGNOSIS — E118 Type 2 diabetes mellitus with unspecified complications: Secondary | ICD-10-CM | POA: Diagnosis not present

## 2014-01-29 DIAGNOSIS — E785 Hyperlipidemia, unspecified: Secondary | ICD-10-CM | POA: Diagnosis not present

## 2014-01-29 LAB — BASIC METABOLIC PANEL
BUN: 13 mg/dL (ref 4–21)
CREATININE: 0.8 mg/dL (ref 0.5–1.1)
GLUCOSE: 123 mg/dL
Potassium: 4.2 mmol/L (ref 3.4–5.3)
Sodium: 143 mmol/L (ref 137–147)

## 2014-01-29 LAB — HEMOGLOBIN A1C: HEMOGLOBIN A1C: 6.7 % — AB (ref 4.0–6.0)

## 2014-02-17 ENCOUNTER — Encounter: Payer: Self-pay | Admitting: Internal Medicine

## 2014-02-17 ENCOUNTER — Non-Acute Institutional Stay (SKILLED_NURSING_FACILITY): Payer: Medicare Other | Admitting: Internal Medicine

## 2014-02-17 DIAGNOSIS — R030 Elevated blood-pressure reading, without diagnosis of hypertension: Secondary | ICD-10-CM

## 2014-02-17 DIAGNOSIS — F32A Depression, unspecified: Secondary | ICD-10-CM

## 2014-02-17 DIAGNOSIS — F259 Schizoaffective disorder, unspecified: Secondary | ICD-10-CM | POA: Diagnosis not present

## 2014-02-17 DIAGNOSIS — F329 Major depressive disorder, single episode, unspecified: Secondary | ICD-10-CM | POA: Diagnosis not present

## 2014-02-17 DIAGNOSIS — F0390 Unspecified dementia without behavioral disturbance: Secondary | ICD-10-CM

## 2014-02-17 DIAGNOSIS — E119 Type 2 diabetes mellitus without complications: Secondary | ICD-10-CM

## 2014-02-17 DIAGNOSIS — IMO0001 Reserved for inherently not codable concepts without codable children: Secondary | ICD-10-CM

## 2014-02-17 NOTE — Progress Notes (Signed)
Patient ID: Debra Tran, female   DOB: Aug 21, 1927, 80 y.o.   MRN: [id number]   Place of Service: [facility]  Allergies  Allergen Reactions  . Ether Nausea And Vomiting    Code Status: Full Code  Goals of Care: Longevity/Long term care  Chief Complaint  Patient presents with  . Medical Management of Chronic Issues    DM2, dementia, depression, schizoaffective     HPI 80 y.o. female with PMH of DM2, dementia, depression, schizoaffective disorder among others is being seen for a routine visit. Weight stable. No recent falls or new skin concerns. No change in behaviors or functional status reported. No concern from nursing staff. No concerns verbalized from patient  Review of Systems Constitutional: Negative for fever and chills Eyes: Negative for eye pain. Positive for blindness in Left eye Cardiovascular: Negative for chest pain  Respiratory: Negative cough and shortness of breath Gastrointestinal: Negative for nausea and vomiting. Negative for abdominal pain, diarrhea and constipation.  Musculoskeletal: Negative for back pain, joint pain, and joint swelling  Neurological: Negative for dizziness and headache Skin: Negative for rash and wound.   Psychiatric: Negative for depression   Past Medical History  Diagnosis Date  . Phobia   . Macular degeneration   . Narcissism   . Schizophrenia, paranoid   . Fungal dermatitis   . Hyperlipidemia   . Mild cognitive impairment   . Diabetes mellitus type 2 in obese   . Stress incontinence, female   . Allergic rhinitis   . Normocytic anemia   . Depressive disorder   . Hypertension     Past Surgical History  Procedure Laterality Date  . Cataract extraction  6 16 2006  . Cryotherapy  2 15 2006    facial AK  . Total abdominal hysterectomy w/ bilateral salpingoophorectomy      due to endometriosis  non malignant  . Hyperplastic   4 20 2006    AK shave biopsy  . Abdominal hysterectomy      due to  fibroids    History   Social History  . Marital Status: Divorced    Spouse Name: N/A    Number of Children: N/A  . Years of Education: N/A   Occupational History  . Not on file.   Social History Main Topics  . Smoking status: Former Research scientist (life sciences)  . Smokeless tobacco: Not on file  . Alcohol Use: No  . Drug Use: No  . Sexual Activity: Not on file   Other Topics Concern  . Not on file   Social History Narrative   Lives in [facility] (assisted living), remotely smoked and chewed tobacco. Daughter([name])  drives her, independent of all ADL's      Medication List       This list is accurate as of: 02/17/14  4:47 PM.  Always use your most recent med list.               aspirin EC 81 MG tablet  Take 81 mg by mouth every morning.     atorvastatin 20 MG tablet  Commonly known as:  LIPITOR  Take 1 tablet (20 mg total) by mouth at bedtime.     CALCIUM 600-D 600-400 MG-UNIT per tablet  Generic drug:  Calcium Carbonate-Vitamin D  Take 1 tablet by mouth every morning.     docusate sodium 100 MG capsule  Commonly known as:  COLACE  Take 100 mg by mouth 2 (two) times daily.  donepezil 5 MG tablet  Commonly known as:  ARICEPT  Take 10 mg by mouth at bedtime.     escitalopram 10 MG tablet  Commonly known as:  LEXAPRO  Take 40 mg by mouth at bedtime.     fluticasone 50 MCG/ACT nasal spray  Commonly known as:  FLONASE  Place 2 sprays into the nose every morning.     insulin glargine 100 UNIT/ML injection  Commonly known as:  LANTUS  Inject 10 Units into the skin at bedtime.     insulin lispro 100 UNIT/ML injection  Commonly known as:  HUMALOG  Inject 5 Units into the skin 3 (three) times daily before meals.     insulin lispro 100 UNIT/ML injection  Commonly known as:  HUMALOG  Inject 5 Units into the skin 3 (three) times daily before meals. As needed if CBG over 150 (in additional to standard 5 units meal coverage)     ketotifen 0.025 % ophthalmic solution   Commonly known as:  ZADITOR  Place 1 drop into both eyes 2 (two) times daily.     metFORMIN 500 MG tablet  Commonly known as:  GLUCOPHAGE  Take 2 tablets (1,000 mg total) by mouth 2 (two) times daily with a meal.     multivitamin with minerals Tabs tablet  Take 1 tablet by mouth every morning.     NAMENDA XR 28 MG Cp24  Generic drug:  Memantine HCl ER  Take 28 mg by mouth daily.     nortriptyline 75 MG capsule  Commonly known as:  PAMELOR  Take 75 mg by mouth at bedtime.     OLANZapine 5 MG tablet  Commonly known as:  ZYPREXA  Take 15 mg by mouth at bedtime.     omeprazole 20 MG capsule  Commonly known as:  PRILOSEC  Take 1 capsule (20 mg total) by mouth 2 (two) times daily.     primidone 50 MG tablet  Commonly known as:  MYSOLINE  Take 25 mg by mouth at bedtime.        Physical Exam Filed Vitals:   02/17/14 1325  BP: 168/79  Pulse: 67  Temp: 97.6 F (36.4 C)  Resp: 18   Constitutional: WDWN elderly female in no acute distress. Conversant and pleasent  HEENT: Normocephalic and atraumatic. Right PRRL. EOM intact. No icterus. Oral mucosa moist. Posterior pharynx clear of any exudate or lesions.  Neck: Supple and nontender. No lymphadenopathy, masses, or thyromegaly. No JVD or carotid bruits. Cardiac: Normal S1, S2. RRR without appreciable murmurs, rubs, or gallops. Distal pulses intact. Trace pitting edema of BLE. Lungs: No respiratory distress. Breath sounds clear bilaterally without rales, rhonchi, or wheezes. Abdomen: Audible bowel sounds in all quadrants. Soft, nontender, nondistended. No palpable mass.  Musculoskeletal: Able to move all extremities. No joint erythema or tenderness.  Skin: Warm and dry. No rash noted. No erythema.  Neurological: Alert and oriented to person, place.  Psychiatric: Appropriate mood and affect.   Labs Reviewed CBC Latest Ref Rng 10/21/2013 07/13/2012 05/31/2012  WBC - 8.1 8.1 7.0  Hemoglobin 12.0 - 16.0 g/dL 11.6(A) 11.8(L) 11.2(L)   Hematocrit 36 - 46 % 35(A) 36.2 34.4(L)  Platelets 150 - 399 K/L 249 272 234    CMP     Component Value Date/Time   NA 143 01/29/2014   NA 137 07/13/2012 0017   K 4.2 01/29/2014   CL 98 07/13/2012 0017   CO2 23 07/13/2012 0017   GLUCOSE 61* 07/13/2012 0017  BUN 13 01/29/2014   BUN 35* 07/13/2012 0017   CREATININE 0.8 01/29/2014   CREATININE 1.09 07/13/2012 0017   CREATININE 1.00 08/31/2011 1124   CALCIUM 9.2 07/13/2012 0017   PROT 6.8 07/13/2012 0017   ALBUMIN 3.5 07/13/2012 0017   AST 25 07/13/2012 0017   ALT 16 07/13/2012 0017   ALKPHOS 72 07/13/2012 0017   BILITOT 0.4 07/13/2012 0017   GFRNONAA 46* 07/13/2012 0017   GFRAA 53* 07/13/2012 0017    Lab Results  Component Value Date   HGBA1C 6.7* 01/29/2014    Assessment & Plan 1. Dementia, without behavioral disturbance Stable. Continue namenda xr 28mg  daily, aricept 10mg  daily, and monitor for change in behaviors. Continue fall risk and pressure ulcer prophylaxis.   2. Depressive disorder Stable. Continue lexapro 40mg  daily and nortriptyline 40mg  daily at bedtime. Continue to monitor mood and f/u with psy  3. Controlled diabetes mellitus type II without complication Stable. Last a1c 6.7 on 01/29/14. CBGs range from 97-251, mostly 150s. Continue metformin 1000mg  bid, lantus 10 units daily, and mealtime insulin 5 units TID with meals and additional 5 units for CBGs > 150. No proteinuria. Continue low dose aspirin and statin for lipids. Eye and foot exam are up to date.   4. Schizoaffective disorder, unspecified type Stable. Continue olanzapine 15mg  daily and monitor. F/u with psy  5. Encounter for diabetic foot exam See documentation in health maintenance and quality metrics sections  6. Elevated BP  BP usually 110s to 130s/60s. Not on BP med. Continue to monitor BP for now. If consistently above 140/80, will consider adding low dose lisinopril.    Labs Ordered: TSH  Family/Staff Communication Plan of care  discuss with nursing staff. Nursing staff verbalize understanding and agree with plan of care. No additional questions or concerns reported.    Arthur Holms, MSN, AGNP-C [facility] [address] [place], [place] [address] [phone] [8am-5pm] After hours: [phone]

## 2014-02-19 DIAGNOSIS — E039 Hypothyroidism, unspecified: Secondary | ICD-10-CM | POA: Diagnosis not present

## 2014-02-19 DIAGNOSIS — E785 Hyperlipidemia, unspecified: Secondary | ICD-10-CM | POA: Diagnosis not present

## 2014-03-02 ENCOUNTER — Non-Acute Institutional Stay (SKILLED_NURSING_FACILITY): Payer: Medicare Other | Admitting: Internal Medicine

## 2014-03-02 ENCOUNTER — Encounter: Payer: Self-pay | Admitting: Internal Medicine

## 2014-03-02 DIAGNOSIS — R6889 Other general symptoms and signs: Secondary | ICD-10-CM | POA: Diagnosis not present

## 2014-03-02 DIAGNOSIS — R05 Cough: Secondary | ICD-10-CM

## 2014-03-02 DIAGNOSIS — R059 Cough, unspecified: Secondary | ICD-10-CM

## 2014-03-02 NOTE — Progress Notes (Signed)
MRN: [id number] Name: Debra Tran  Sex: female Age: 80 y.o. DOB: [birth year]  Ethete #: Karren Burly Facility/Room: 214 Level Of Care: SNF Provider: Inocencio Homes D Emergency Contacts: Extended Emergency Contact Information Primary Emergency Contact: Plain,[name] Address: [address]          [place] Phone: [phone] Relation: Daughter  Code Status: FULL  Allergies: Ether  Chief Complaint  Patient presents with  . Acute Visit    HPI: Patient is 80 y.o. female who nursing asked me to see for cough for several days and decreased activity.  Past Medical History  Diagnosis Date  . Phobia   . Macular degeneration   . Narcissism   . Schizophrenia, paranoid   . Fungal dermatitis   . Hyperlipidemia   . Mild cognitive impairment   . Diabetes mellitus type 2 in obese   . Stress incontinence, female   . Allergic rhinitis   . Normocytic anemia   . Depressive disorder   . Hypertension     Past Surgical History  Procedure Laterality Date  . Cataract extraction  6 16 2006  . Cryotherapy  2 15 2006    facial AK  . Total abdominal hysterectomy w/ bilateral salpingoophorectomy      due to endometriosis  non malignant  . Hyperplastic   4 20 2006    AK shave biopsy  . Abdominal hysterectomy      due to fibroids      Medication List       This list is accurate as of: 03/02/14  7:45 PM.  Always use your most recent med list.               aspirin EC 81 MG tablet  Take 81 mg by mouth every morning.     atorvastatin 20 MG tablet  Commonly known as:  LIPITOR  Take 1 tablet (20 mg total) by mouth at bedtime.     CALCIUM 600-D 600-400 MG-UNIT per tablet  Generic drug:  Calcium Carbonate-Vitamin D  Take 1 tablet by mouth every morning.     docusate sodium 100 MG capsule  Commonly known as:  COLACE  Take 100 mg by mouth 2 (two) times daily.     donepezil 5 MG tablet  Commonly known as:  ARICEPT  Take 10 mg by mouth at  bedtime.     escitalopram 10 MG tablet  Commonly known as:  LEXAPRO  Take 40 mg by mouth at bedtime.     fluticasone 50 MCG/ACT nasal spray  Commonly known as:  FLONASE  Place 2 sprays into the nose every morning.     insulin glargine 100 UNIT/ML injection  Commonly known as:  LANTUS  Inject 10 Units into the skin at bedtime.     insulin lispro 100 UNIT/ML injection  Commonly known as:  HUMALOG  Inject 5 Units into the skin 3 (three) times daily before meals.     insulin lispro 100 UNIT/ML injection  Commonly known as:  HUMALOG  Inject 5 Units into the skin 3 (three) times daily before meals. As needed if CBG over 150 (in additional to standard 5 units meal coverage)     ketotifen 0.025 % ophthalmic solution  Commonly known as:  ZADITOR  Place 1 drop into both eyes 2 (two) times daily.     metFORMIN 500 MG tablet  Commonly known as:  GLUCOPHAGE  Take 2 tablets (1,000 mg total) by mouth 2 (two) times  daily with a meal.     multivitamin with minerals Tabs tablet  Take 1 tablet by mouth every morning.     NAMENDA XR 28 MG Cp24  Generic drug:  Memantine HCl ER  Take 28 mg by mouth daily.     nortriptyline 75 MG capsule  Commonly known as:  PAMELOR  Take 75 mg by mouth at bedtime.     OLANZapine 5 MG tablet  Commonly known as:  ZYPREXA  Take 15 mg by mouth at bedtime.     omeprazole 20 MG capsule  Commonly known as:  PRILOSEC  Take 1 capsule (20 mg total) by mouth 2 (two) times daily.     primidone 50 MG tablet  Commonly known as:  MYSOLINE  Take 25 mg by mouth at bedtime.        No orders of the defined types were placed in this encounter.    Immunization History  Administered Date(s) Administered  . Influenza Split 02/15/2011, 01/21/2012  . Influenza Whole 02/05/2007, 01/17/2010, 01/28/2013  . Pneumococcal Polysaccharide-23 04/10/1999  . Td 10/07/2001    History  Substance Use Topics  . Smoking status: Former Research scientist (life sciences)  . Smokeless tobacco: Not on file   . Alcohol Use: No    Review of Systems  DATA OBTAINED: from patient, nurse GENERAL:  no fevers,  Not as active as usual for past 2-3 days SKIN: No itching, rash HEENT: No complaint RESPIRATORY: + cough, no wheezing,no  SOB CARDIAC: No chest pain, palpitations, lower extremity edema  GI: No abdominal pain, No N/V/D or constipation, No heartburn or reflux  GU: No dysuria, frequency or urgency, or incontinence  MUSCULOSKELETAL: No unrelieved bone/joint pain NEUROLOGIC: No headache, dizziness  PSYCHIATRIC: No overt anxiety or sadness  Filed Vitals:   03/02/14 1651  BP: 134/67  Pulse: 77  Temp: 97.5 F (36.4 C)  Resp: 18    Physical Exam  GENERAL APPEARANCE: Alert,min  conversant, No acute distress  SKIN: No diaphoresis, + pallor  HEENT: Unremarkable RESPIRATORY: Breathing is even, unlabored. Lung sounds are diffusely decreased, no wheeze, no rales CARDIOVASCULAR: Heart RRR no murmurs, rubs or gallops. No peripheral edema  GASTROINTESTINAL: Abdomen is soft, non-tender, not distended w/ normal bowel sounds.  GENITOURINARY: Bladder non tender, not distended  MUSCULOSKELETAL: No abnormal joints or musculature NEUROLOGIC: Cranial nerves 2-12 grossly intact PSYCHIATRIC: Mood and affect appropriate to situation, no behavioral issues  Patient Active Problem List   Diagnosis Date Noted  . Normocytic anemia   . Depressive disorder   . Hypertension   . Tinea corporis 10/15/2012  . Arch pain of left foot 10/15/2012  . Right ankle pain 03/04/2012  . Dementia 12/12/2011  . Weight loss 08/31/2011  . Vaginal odor 08/31/2011  . Repeated falls 03/12/2011  . Acid indigestion 06/15/2010  . FUNGAL DERMATITIS 03/14/2010  . ANEMIA, NORMOCYTIC 10/13/2009  . Mild dementia 10/13/2009  . DIZZINESS 10/13/2009  . GAIT DISTURBANCE 10/13/2009  . CORNS AND CALLOSITIES 09/20/2009  . ARTHRITIS 09/20/2009  . Controlled diabetes mellitus type II without complication 95/18/8416  . Hyperlipidemia  associated with type 2 diabetes mellitus 06/06/2006  . OBESITY, NOS 06/06/2006  . Schizophrenia 06/06/2006  . DEPRESSIVE DISORDER, NOS 06/06/2006  . CATARACT 06/06/2006  . HYPERTENSION, BENIGN SYSTEMIC 06/06/2006  . RHINITIS, ALLERGIC 06/06/2006  . INCONTINENCE, STRESS, FEMALE 06/06/2006    CBC    Component Value Date/Time   WBC 8.1 10/21/2013   WBC 8.1 07/13/2012 0017   RBC 4.45 07/13/2012 0017   HGB  11.6* 10/21/2013   HCT 35* 10/21/2013   PLT 249 10/21/2013   MCV 81.3 07/13/2012 0017   LYMPHSABS 2.3 07/13/2012 0017   MONOABS 0.6 07/13/2012 0017   EOSABS 0.0 07/13/2012 0017   BASOSABS 0.1 07/13/2012 0017    CMP     Component Value Date/Time   NA 143 01/29/2014   NA 137 07/13/2012 0017   K 4.2 01/29/2014   CL 98 07/13/2012 0017   CO2 23 07/13/2012 0017   GLUCOSE 61* 07/13/2012 0017   BUN 13 01/29/2014   BUN 35* 07/13/2012 0017   CREATININE 0.8 01/29/2014   CREATININE 1.09 07/13/2012 0017   CREATININE 1.00 08/31/2011 1124   CALCIUM 9.2 07/13/2012 0017   PROT 6.8 07/13/2012 0017   ALBUMIN 3.5 07/13/2012 0017   AST 25 07/13/2012 0017   ALT 16 07/13/2012 0017   ALKPHOS 72 07/13/2012 0017   BILITOT 0.4 07/13/2012 0017   GFRNONAA 46* 07/13/2012 0017   GFRAA 53* 07/13/2012 0017    Assessment and Plan  COUGH AND DECREASE IN ACTIVITY - may be the only signs/sx; CXR, U/A with C and S, CBC, BMP and will follow up  Hennie Duos, MD

## 2014-03-07 DIAGNOSIS — R222 Localized swelling, mass and lump, trunk: Secondary | ICD-10-CM | POA: Diagnosis not present

## 2014-03-09 ENCOUNTER — Non-Acute Institutional Stay (SKILLED_NURSING_FACILITY): Payer: Medicare Other | Admitting: Adult Health

## 2014-03-09 ENCOUNTER — Encounter: Payer: Self-pay | Admitting: Adult Health

## 2014-03-09 DIAGNOSIS — J984 Other disorders of lung: Secondary | ICD-10-CM

## 2014-03-09 DIAGNOSIS — R918 Other nonspecific abnormal finding of lung field: Secondary | ICD-10-CM

## 2014-03-09 NOTE — Progress Notes (Signed)
Patient ID: Debra Tran, female   DOB: 22-Dec-1927, 80 y.o.   MRN: [id number]  [facility]     Allergies  Allergen Reactions  . Ether Nausea And Vomiting       Chief Complaint  Patient presents with  . Acute Visit    follow up chest x-ray     HPI:  She had a chest x-ray performed due to cough. The x-ray shows a right lower lung mass. I have had an extensive discussion with her daughter regarding the results of the x-ray. She tells me that her mother has been coughing for an extended period of time. She tells me that her mother has been coughing for over a year with shortness of breath and wheezing present. She is interested in her mother getting a ct of the chest for further evaluation.    Past Medical History  Diagnosis Date  . Phobia   . Macular degeneration   . Narcissism   . Schizophrenia, paranoid   . Fungal dermatitis   . Hyperlipidemia   . Mild cognitive impairment   . Diabetes mellitus type 2 in obese   . Stress incontinence, female   . Allergic rhinitis   . Normocytic anemia   . Depressive disorder   . Hypertension     Past Surgical History  Procedure Laterality Date  . Cataract extraction  6 16 2006  . Cryotherapy  2 15 2006    facial AK  . Total abdominal hysterectomy w/ bilateral salpingoophorectomy      due to endometriosis  non malignant  . Hyperplastic   4 20 2006    AK shave biopsy  . Abdominal hysterectomy      due to fibroids    VITAL SIGNS BP 132/76 mmHg  Pulse 76  Ht 5' (1.524 m)  Wt 183 lb (83.008 kg)  BMI 35.74 kg/m2  SpO2 97%   Outpatient Encounter Prescriptions as of 03/09/2014  Medication Sig  . aspirin EC 81 MG tablet Take 81 mg by mouth every morning.   Marland Kitchen atorvastatin (LIPITOR) 20 MG tablet Take 1 tablet (20 mg total) by mouth at bedtime.  . Calcium Carbonate-Vitamin D (CALCIUM 600-D) 600-400 MG-UNIT per tablet Take 1 tablet by mouth every morning.   . docusate sodium (COLACE) 100 MG capsule Take 100 mg by mouth 2 (two)  times daily.  Marland Kitchen donepezil (ARICEPT) 5 MG tablet Take 10 mg by mouth at bedtime.   Marland Kitchen escitalopram (LEXAPRO) 10 MG tablet Take 40 mg by mouth at bedtime.   . fluticasone (FLONASE) 50 MCG/ACT nasal spray Place 2 sprays into the nose daily as needed.   . insulin glargine (LANTUS) 100 UNIT/ML injection Inject 10 Units into the skin at bedtime.  . insulin lispro (HUMALOG) 100 UNIT/ML injection Inject 5 Units into the skin 3 (three) times daily before meals.  . insulin lispro (HUMALOG) 100 UNIT/ML injection Inject 5 Units into the skin 3 (three) times daily before meals. As needed if CBG over 150 (in additional to standard 5 units meal coverage)  . ketotifen (ZADITOR) 0.025 % ophthalmic solution Place 1 drop into both eyes 2 (two) times daily.  . Memantine HCl ER (NAMENDA XR) 28 MG CP24 Take 28 mg by mouth daily.  . metFORMIN (GLUCOPHAGE) 500 MG tablet Take 2 tablets (1,000 mg total) by mouth 2 (two) times daily with a meal.  . Multiple Vitamin (MULTIVITAMIN WITH MINERALS) TABS Take 1 tablet by mouth every morning.  . nortriptyline (PAMELOR) 75 MG capsule Take  75 mg by mouth at bedtime.  Marland Kitchen OLANZapine (ZYPREXA) 5 MG tablet Take 15 mg by mouth at bedtime.  Marland Kitchen omeprazole (PRILOSEC) 20 MG capsule Take 1 capsule (20 mg total) by mouth 2 (two) times daily.  . primidone (MYSOLINE) 50 MG tablet Take 25 mg by mouth at bedtime.     SIGNIFICANT DIAGNOSTIC EXAMS  03-07-14: chest x-ray: right lower lobe mass (2.5 cm)   LABS REVIEWED:  10-21-13: chol 155; ldl 82; trig 133 01-29-14: glucose 123; bun 12.6; creat 0.79; k+4.2; na++ 143; hgb a1c 6.8;  Urine micro-albumin: 1.2  Review of Systems  Constitutional: Negative for malaise/fatigue.  Respiratory: Positive for cough and shortness of breath. Negative for sputum production and wheezing.   Cardiovascular: Negative for chest pain, palpitations and leg swelling.  Gastrointestinal: Negative for heartburn, abdominal pain and constipation.  Musculoskeletal:  Negative for myalgias and joint pain.  Skin: Negative.   Psychiatric/Behavioral: The patient is not nervous/anxious.      Physical Exam  Constitutional: No distress.  Over weight   Neck: Neck supple. No JVD present.  Cardiovascular: Normal rate, regular rhythm and intact distal pulses.   Respiratory: Effort normal. No respiratory distress. She has no wheezes.  Slight diminished right lower lung   GI: Soft. Bowel sounds are normal. She exhibits no distension. There is no tenderness.  Musculoskeletal: She exhibits no edema.  Is able to move all extremities   Neurological: She is alert.  Skin: Skin is warm and dry. She is not diaphoretic.       ASSESSMENT/ PLAN:  1. Right lower lung mass: after speaking with her daughter; and with [place]; will get a bmp in the am and will them setup a ct of the chest with contrast asap for further evaluation. Will have speech therapy see her for evaluation for possible dysphagia. Will monitor her status.   Time spent with patient 45 minutes.   Ok Edwards NP [facility]  Contact [phone] Monday through Friday 8am- 5pm  After hours call [phone]

## 2014-03-10 ENCOUNTER — Other Ambulatory Visit: Payer: Self-pay | Admitting: Internal Medicine

## 2014-03-10 DIAGNOSIS — IMO0002 Reserved for concepts with insufficient information to code with codable children: Secondary | ICD-10-CM

## 2014-03-10 DIAGNOSIS — E118 Type 2 diabetes mellitus with unspecified complications: Secondary | ICD-10-CM | POA: Diagnosis not present

## 2014-03-10 DIAGNOSIS — R229 Localized swelling, mass and lump, unspecified: Principal | ICD-10-CM

## 2014-03-10 DIAGNOSIS — E785 Hyperlipidemia, unspecified: Secondary | ICD-10-CM | POA: Diagnosis not present

## 2014-03-10 DIAGNOSIS — E1165 Type 2 diabetes mellitus with hyperglycemia: Secondary | ICD-10-CM | POA: Diagnosis not present

## 2014-03-10 DIAGNOSIS — R0602 Shortness of breath: Secondary | ICD-10-CM

## 2014-03-10 DIAGNOSIS — Z79899 Other long term (current) drug therapy: Secondary | ICD-10-CM | POA: Diagnosis not present

## 2014-03-15 DIAGNOSIS — F0391 Unspecified dementia with behavioral disturbance: Secondary | ICD-10-CM | POA: Diagnosis not present

## 2014-03-15 DIAGNOSIS — F329 Major depressive disorder, single episode, unspecified: Secondary | ICD-10-CM | POA: Diagnosis not present

## 2014-03-15 DIAGNOSIS — F259 Schizoaffective disorder, unspecified: Secondary | ICD-10-CM | POA: Diagnosis not present

## 2014-03-17 ENCOUNTER — Ambulatory Visit (HOSPITAL_COMMUNITY): Payer: Medicare Other

## 2014-03-19 ENCOUNTER — Ambulatory Visit (HOSPITAL_COMMUNITY)
Admission: RE | Admit: 2014-03-19 | Discharge: 2014-03-19 | Disposition: A | Discharge status: Home / Self Care | Payer: Medicare Other | Source: Ambulatory Visit | Attending: Internal Medicine | Admitting: Internal Medicine

## 2014-03-19 DIAGNOSIS — R0602 Shortness of breath: Secondary | ICD-10-CM | POA: Insufficient documentation

## 2014-03-19 MED ORDER — IOHEXOL 300 MG/ML  SOLN
80.0000 mL | Freq: Once | INTRAMUSCULAR | Status: AC | PRN
  Administered 2014-03-19: 80 mL via INTRAVENOUS

## 2014-03-22 ENCOUNTER — Non-Acute Institutional Stay (SKILLED_NURSING_FACILITY): Payer: Medicare Other | Admitting: Adult Health

## 2014-03-22 ENCOUNTER — Encounter: Payer: Self-pay | Admitting: Adult Health

## 2014-03-22 DIAGNOSIS — E785 Hyperlipidemia, unspecified: Secondary | ICD-10-CM

## 2014-03-22 DIAGNOSIS — F32A Depression, unspecified: Secondary | ICD-10-CM

## 2014-03-22 DIAGNOSIS — F209 Schizophrenia, unspecified: Secondary | ICD-10-CM

## 2014-03-22 DIAGNOSIS — F329 Major depressive disorder, single episode, unspecified: Secondary | ICD-10-CM

## 2014-03-22 DIAGNOSIS — E119 Type 2 diabetes mellitus without complications: Secondary | ICD-10-CM | POA: Diagnosis not present

## 2014-03-22 DIAGNOSIS — F0390 Unspecified dementia without behavioral disturbance: Secondary | ICD-10-CM

## 2014-03-22 DIAGNOSIS — E1169 Type 2 diabetes mellitus with other specified complication: Secondary | ICD-10-CM

## 2014-03-22 DIAGNOSIS — G250 Essential tremor: Secondary | ICD-10-CM | POA: Insufficient documentation

## 2014-03-22 NOTE — Progress Notes (Signed)
Patient ID: Debra Tran, female   DOB: June 05, 1927, 80 y.o.   MRN: [id number]  [facility]     Allergies  Allergen Reactions  . Ether Nausea And Vomiting       Chief Complaint  Patient presents with  . Medical Management of Chronic Issues    HPI:  She is a resident of this facility being seen for the management of her chronic illnesses. Overall her status is stable. The ct of the chest did not show a lung mass present. She is not voicing any complaints or concerns today. States that she is feeling good. There are no nursing concerns at this time.    Past Medical History  Diagnosis Date  . Phobia   . Macular degeneration   . Narcissism   . Schizophrenia, paranoid   . Fungal dermatitis   . Hyperlipidemia   . Mild cognitive impairment   . Diabetes mellitus type 2 in obese   . Stress incontinence, female   . Allergic rhinitis   . Normocytic anemia   . Depressive disorder   . Hypertension   . CATARACT 06/06/2006    Qualifier: Diagnosis of  By: Genene Churn MD, Janett Billow      Past Surgical History  Procedure Laterality Date  . Cataract extraction  6 16 2006  . Cryotherapy  2 15 2006    facial AK  . Total abdominal hysterectomy w/ bilateral salpingoophorectomy      due to endometriosis  non malignant  . Hyperplastic   4 20 2006    AK shave biopsy  . Abdominal hysterectomy      due to fibroids    VITAL SIGNS BP 132/76 mmHg  Pulse 76  Ht 5' (1.524 m)  Wt 183 lb (83.008 kg)  BMI 35.74 kg/m2   Outpatient Encounter Prescriptions as of 03/22/2014  Medication Sig  . aspirin EC 81 MG tablet Take 81 mg by mouth every morning.   Marland Kitchen atorvastatin (LIPITOR) 20 MG tablet Take 1 tablet (20 mg total) by mouth at bedtime.  . Calcium Carbonate-Vitamin D (CALCIUM 600-D) 600-400 MG-UNIT per tablet Take 1 tablet by mouth every morning.   . docusate sodium (COLACE) 100 MG capsule Take 100 mg by mouth 2 (two) times daily.  Marland Kitchen donepezil (ARICEPT) 5 MG tablet Take 10 mg by mouth at  bedtime.   Marland Kitchen escitalopram (LEXAPRO) 10 MG tablet Take 40 mg by mouth at bedtime.   . fluticasone (FLONASE) 50 MCG/ACT nasal spray Place 2 sprays into the nose daily as needed.   . insulin glargine (LANTUS) 100 UNIT/ML injection Inject 10 Units into the skin at bedtime.  . insulin lispro (HUMALOG) 100 UNIT/ML injection Inject 5 Units into the skin 3 (three) times daily before meals.  . insulin lispro (HUMALOG) 100 UNIT/ML injection Inject 5 Units into the skin 3 (three) times daily before meals. As needed if CBG over 150 (in additional to standard 5 units meal coverage)  . ketotifen (ZADITOR) 0.025 % ophthalmic solution Place 1 drop into both eyes 2 (two) times daily.  . Memantine HCl ER (NAMENDA XR) 28 MG CP24 Take 28 mg by mouth daily.  . metFORMIN (GLUCOPHAGE) 500 MG tablet Take 2 tablets (1,000 mg total) by mouth 2 (two) times daily with a meal.  . Multiple Vitamin (MULTIVITAMIN WITH MINERALS) TABS Take 1 tablet by mouth every morning.  . nortriptyline (PAMELOR) 75 MG capsule Take 75 mg by mouth at bedtime.  Marland Kitchen OLANZapine (ZYPREXA) 5 MG tablet Take 15 mg by  mouth at bedtime.  Marland Kitchen omeprazole (PRILOSEC) 20 MG capsule Take 1 capsule (20 mg total) by mouth 2 (two) times daily.  . primidone (MYSOLINE) 50 MG tablet Take 25 mg by mouth at bedtime.     SIGNIFICANT DIAGNOSTIC EXAMS  03-07-14: chest x-ray: right lower lobe mass (2.5 cm)   03-19-14: ct of chest: No parenchymal mass is noted within the lungs. No acute abnormality noted.   LABS REVIEWED:  10-21-13: chol 155; ldl 82; trig 133 01-29-14: glucose 123; bun 12.6; creat 0.79; k+4.2; na++ 143; hgb a1c 6.8;  Urine micro-albumin: 1.2 02-19-14: tsh 2.66  03-10-14: wbc 8.1; hgb 11.7; hct 39.8; mcv 86.6; plt 243; glucose 176; bun 12.6; creat 0.74; k+4.3; na++143     ROS  Constitutional: Negative for malaise/fatigue.  Respiratory: negative for cough or shortness of breath  Cardiovascular: Negative for chest pain, palpitations and leg  swelling.  Gastrointestinal: Negative for heartburn, abdominal pain and constipation.  Musculoskeletal: Negative for myalgias and joint pain.  Skin: Negative.   Psychiatric/Behavioral: The patient is not nervous/anxious.    Physical Exam  Constitutional: No distress.  Over weight   Neck: Neck supple. No JVD present.  Cardiovascular: Normal rate, regular rhythm and intact distal pulses.   Respiratory: Effort normal. No respiratory distress. She has no wheezes.  Slight diminished right lower lung   GI: Soft. Bowel sounds are normal. She exhibits no distension. There is no tenderness.  Musculoskeletal: She exhibits no edema.  Is able to move all extremities   Neurological: She is alert.  Skin: Skin is warm and dry. She is not diaphoretic   ASSESSMENT/ PLAN:  1. Dementia without behavior disturbance; is without change will continue aricept 10 mg daily and namenda xr 28 mg daily will not make changes will monitor   2. Tremor is stable will continue primidone 25 mg nightly   3. Dyslipidemia: will continue lipitor 20 mg daily ldl is 82  4. Diabetes is stable is taking  lantus 10 units nightly; will continue humalog 5 units prior to meals with an additional 3 units for cbg >=150; metformin 1 gm twice daily her hgb a1c is 6.8. Will stop the humalog and will monitor her status.   5. [name]: will continue prilosec 20 mg twice daily   6. Constipation: will continue colace twice daily   7. Depression: will continue lexapro 10 mg nightly and pamelor 75 mg nightly and will monitor   8. Schizophrenia: is stable will continue zyprexa 15 mg nightly and will monitor      Ok Edwards NP [facility]  Contact [phone] Monday through Friday 8am- 5pm  After hours call [phone]

## 2014-04-06 ENCOUNTER — Other Ambulatory Visit: Payer: Medicare Other

## 2014-04-06 ENCOUNTER — Ambulatory Visit (INDEPENDENT_AMBULATORY_CARE_PROVIDER_SITE_OTHER): Payer: Medicare Other

## 2014-04-06 DIAGNOSIS — E1140 Type 2 diabetes mellitus with diabetic neuropathy, unspecified: Secondary | ICD-10-CM | POA: Diagnosis not present

## 2014-04-06 DIAGNOSIS — L603 Nail dystrophy: Secondary | ICD-10-CM

## 2014-04-06 DIAGNOSIS — M79673 Pain in unspecified foot: Secondary | ICD-10-CM

## 2014-04-06 NOTE — Progress Notes (Signed)
   Subjective:    Patient ID: Debra Tran, female    DOB: 29-Jun-1927, 80 y.o.   MRN: [id number]  HPI patient presents this time for diabetic foot and nail care    Review of Systems no new findings or systemic changes noted     Objective:   Physical Exam Neurovascular status is unchanged DP and PT one over 4 bilateral 3 second cap refill time there is decreased sensation of the forefoot digits on Semmes Weinstein testing nails thick Crumley brittle friable discolored with pain tenderness on palpation and debridement no open wounds no ulcers no secondary infections. Mild arthropathy the great toe joints are noted no other changes patient is in a wheelchair nonambulatory.       Assessment & Plan:  Assessment diabetes history peripheral neuropathy and angiopathy with abnormal sensation thick brittle crumbly friable criptotic nails 10 debrided and the presence of diabetes and complication and symptomology. No open wounds or ulcers are noted recheck in 3 months for palliative care  Harriet Masson DPM

## 2014-04-09 DIAGNOSIS — D649 Anemia, unspecified: Secondary | ICD-10-CM | POA: Diagnosis not present

## 2014-04-09 DIAGNOSIS — E669 Obesity, unspecified: Secondary | ICD-10-CM | POA: Diagnosis not present

## 2014-04-09 DIAGNOSIS — Z029 Encounter for administrative examinations, unspecified: Secondary | ICD-10-CM | POA: Diagnosis not present

## 2014-04-09 DIAGNOSIS — L603 Nail dystrophy: Secondary | ICD-10-CM | POA: Diagnosis not present

## 2014-04-09 DIAGNOSIS — N393 Stress incontinence (female) (male): Secondary | ICD-10-CM | POA: Diagnosis not present

## 2014-04-09 DIAGNOSIS — G2401 Drug induced subacute dyskinesia: Secondary | ICD-10-CM | POA: Diagnosis not present

## 2014-04-09 DIAGNOSIS — Z7982 Long term (current) use of aspirin: Secondary | ICD-10-CM | POA: Diagnosis not present

## 2014-04-09 DIAGNOSIS — L989 Disorder of the skin and subcutaneous tissue, unspecified: Secondary | ICD-10-CM | POA: Diagnosis not present

## 2014-04-09 DIAGNOSIS — D520 Dietary folate deficiency anemia: Secondary | ICD-10-CM | POA: Diagnosis not present

## 2014-04-09 DIAGNOSIS — I2510 Atherosclerotic heart disease of native coronary artery without angina pectoris: Secondary | ICD-10-CM | POA: Diagnosis not present

## 2014-04-09 DIAGNOSIS — E785 Hyperlipidemia, unspecified: Secondary | ICD-10-CM | POA: Diagnosis not present

## 2014-04-09 DIAGNOSIS — D485 Neoplasm of uncertain behavior of skin: Secondary | ICD-10-CM | POA: Diagnosis not present

## 2014-04-09 DIAGNOSIS — L98411 Non-pressure chronic ulcer of buttock limited to breakdown of skin: Secondary | ICD-10-CM | POA: Diagnosis not present

## 2014-04-09 DIAGNOSIS — T148 Other injury of unspecified body region: Secondary | ICD-10-CM | POA: Diagnosis not present

## 2014-04-09 DIAGNOSIS — E119 Type 2 diabetes mellitus without complications: Secondary | ICD-10-CM | POA: Diagnosis not present

## 2014-04-09 DIAGNOSIS — M1991 Primary osteoarthritis, unspecified site: Secondary | ICD-10-CM | POA: Diagnosis not present

## 2014-04-09 DIAGNOSIS — R51 Headache: Secondary | ICD-10-CM | POA: Diagnosis not present

## 2014-04-09 DIAGNOSIS — R350 Frequency of micturition: Secondary | ICD-10-CM | POA: Diagnosis not present

## 2014-04-09 DIAGNOSIS — H3531 Nonexudative age-related macular degeneration: Secondary | ICD-10-CM | POA: Diagnosis not present

## 2014-04-09 DIAGNOSIS — Z1231 Encounter for screening mammogram for malignant neoplasm of breast: Secondary | ICD-10-CM | POA: Diagnosis not present

## 2014-04-09 DIAGNOSIS — R531 Weakness: Secondary | ICD-10-CM | POA: Diagnosis present

## 2014-04-09 DIAGNOSIS — B351 Tinea unguium: Secondary | ICD-10-CM | POA: Diagnosis not present

## 2014-04-09 DIAGNOSIS — M2041 Other hammer toe(s) (acquired), right foot: Secondary | ICD-10-CM | POA: Diagnosis not present

## 2014-04-09 DIAGNOSIS — R262 Difficulty in walking, not elsewhere classified: Secondary | ICD-10-CM | POA: Diagnosis not present

## 2014-04-09 DIAGNOSIS — Z7951 Long term (current) use of inhaled steroids: Secondary | ICD-10-CM | POA: Diagnosis not present

## 2014-04-09 DIAGNOSIS — F329 Major depressive disorder, single episode, unspecified: Secondary | ICD-10-CM | POA: Diagnosis not present

## 2014-04-09 DIAGNOSIS — F0390 Unspecified dementia without behavioral disturbance: Secondary | ICD-10-CM | POA: Diagnosis not present

## 2014-04-09 DIAGNOSIS — L570 Actinic keratosis: Secondary | ICD-10-CM | POA: Diagnosis not present

## 2014-04-09 DIAGNOSIS — Z8619 Personal history of other infectious and parasitic diseases: Secondary | ICD-10-CM | POA: Diagnosis not present

## 2014-04-09 DIAGNOSIS — L89309 Pressure ulcer of unspecified buttock, unspecified stage: Secondary | ICD-10-CM | POA: Diagnosis not present

## 2014-04-09 DIAGNOSIS — H3530 Unspecified macular degeneration: Secondary | ICD-10-CM | POA: Diagnosis not present

## 2014-04-09 DIAGNOSIS — Z8262 Family history of osteoporosis: Secondary | ICD-10-CM | POA: Diagnosis not present

## 2014-04-09 DIAGNOSIS — H43813 Vitreous degeneration, bilateral: Secondary | ICD-10-CM | POA: Diagnosis not present

## 2014-04-09 DIAGNOSIS — H3532 Exudative age-related macular degeneration: Secondary | ICD-10-CM | POA: Diagnosis not present

## 2014-04-09 DIAGNOSIS — F200 Paranoid schizophrenia: Secondary | ICD-10-CM | POA: Diagnosis not present

## 2014-04-09 DIAGNOSIS — M533 Sacrococcygeal disorders, not elsewhere classified: Secondary | ICD-10-CM | POA: Diagnosis not present

## 2014-04-09 DIAGNOSIS — M79676 Pain in unspecified toe(s): Secondary | ICD-10-CM | POA: Diagnosis not present

## 2014-04-09 DIAGNOSIS — L03115 Cellulitis of right lower limb: Secondary | ICD-10-CM | POA: Diagnosis not present

## 2014-04-09 DIAGNOSIS — R1310 Dysphagia, unspecified: Secondary | ICD-10-CM | POA: Diagnosis not present

## 2014-04-09 DIAGNOSIS — M79671 Pain in right foot: Secondary | ICD-10-CM | POA: Diagnosis not present

## 2014-04-09 DIAGNOSIS — G250 Essential tremor: Secondary | ICD-10-CM | POA: Diagnosis not present

## 2014-04-09 DIAGNOSIS — F259 Schizoaffective disorder, unspecified: Secondary | ICD-10-CM | POA: Diagnosis not present

## 2014-04-09 DIAGNOSIS — K5900 Constipation, unspecified: Secondary | ICD-10-CM | POA: Diagnosis not present

## 2014-04-09 DIAGNOSIS — D518 Other vitamin B12 deficiency anemias: Secondary | ICD-10-CM | POA: Diagnosis not present

## 2014-04-09 DIAGNOSIS — E1159 Type 2 diabetes mellitus with other circulatory complications: Secondary | ICD-10-CM | POA: Diagnosis not present

## 2014-04-09 DIAGNOSIS — M150 Primary generalized (osteo)arthritis: Secondary | ICD-10-CM | POA: Diagnosis not present

## 2014-04-09 DIAGNOSIS — D049 Carcinoma in situ of skin, unspecified: Secondary | ICD-10-CM | POA: Diagnosis not present

## 2014-04-09 DIAGNOSIS — B958 Unspecified staphylococcus as the cause of diseases classified elsewhere: Secondary | ICD-10-CM | POA: Diagnosis not present

## 2014-04-09 DIAGNOSIS — E1169 Type 2 diabetes mellitus with other specified complication: Secondary | ICD-10-CM | POA: Diagnosis not present

## 2014-04-09 DIAGNOSIS — K219 Gastro-esophageal reflux disease without esophagitis: Secondary | ICD-10-CM | POA: Diagnosis not present

## 2014-04-09 DIAGNOSIS — M6281 Muscle weakness (generalized): Secondary | ICD-10-CM | POA: Diagnosis not present

## 2014-04-09 DIAGNOSIS — E039 Hypothyroidism, unspecified: Secondary | ICD-10-CM | POA: Diagnosis not present

## 2014-04-09 DIAGNOSIS — F0391 Unspecified dementia with behavioral disturbance: Secondary | ICD-10-CM | POA: Diagnosis not present

## 2014-04-09 DIAGNOSIS — Z79899 Other long term (current) drug therapy: Secondary | ICD-10-CM | POA: Diagnosis not present

## 2014-04-09 DIAGNOSIS — R293 Abnormal posture: Secondary | ICD-10-CM | POA: Diagnosis not present

## 2014-04-09 DIAGNOSIS — Z87891 Personal history of nicotine dependence: Secondary | ICD-10-CM | POA: Diagnosis not present

## 2014-04-09 DIAGNOSIS — Z8742 Personal history of other diseases of the female genital tract: Secondary | ICD-10-CM | POA: Diagnosis not present

## 2014-04-09 DIAGNOSIS — F209 Schizophrenia, unspecified: Secondary | ICD-10-CM | POA: Diagnosis not present

## 2014-04-09 DIAGNOSIS — H35033 Hypertensive retinopathy, bilateral: Secondary | ICD-10-CM | POA: Diagnosis not present

## 2014-04-09 DIAGNOSIS — Z862 Personal history of diseases of the blood and blood-forming organs and certain disorders involving the immune mechanism: Secondary | ICD-10-CM | POA: Diagnosis not present

## 2014-04-09 DIAGNOSIS — M8580 Other specified disorders of bone density and structure, unspecified site: Secondary | ICD-10-CM | POA: Diagnosis not present

## 2014-04-09 DIAGNOSIS — H269 Unspecified cataract: Secondary | ICD-10-CM | POA: Diagnosis not present

## 2014-04-09 DIAGNOSIS — M79674 Pain in right toe(s): Secondary | ICD-10-CM | POA: Diagnosis not present

## 2014-04-09 DIAGNOSIS — M2040 Other hammer toe(s) (acquired), unspecified foot: Secondary | ICD-10-CM | POA: Diagnosis not present

## 2014-04-09 DIAGNOSIS — M154 Erosive (osteo)arthritis: Secondary | ICD-10-CM | POA: Diagnosis not present

## 2014-04-09 DIAGNOSIS — E559 Vitamin D deficiency, unspecified: Secondary | ICD-10-CM | POA: Diagnosis not present

## 2014-04-09 DIAGNOSIS — E118 Type 2 diabetes mellitus with unspecified complications: Secondary | ICD-10-CM | POA: Diagnosis not present

## 2014-04-09 DIAGNOSIS — M79675 Pain in left toe(s): Secondary | ICD-10-CM | POA: Diagnosis not present

## 2014-04-09 DIAGNOSIS — Z794 Long term (current) use of insulin: Secondary | ICD-10-CM | POA: Diagnosis not present

## 2014-04-09 DIAGNOSIS — E1140 Type 2 diabetes mellitus with diabetic neuropathy, unspecified: Secondary | ICD-10-CM | POA: Diagnosis not present

## 2014-04-09 DIAGNOSIS — I10 Essential (primary) hypertension: Secondary | ICD-10-CM | POA: Diagnosis not present

## 2014-04-09 DIAGNOSIS — M2042 Other hammer toe(s) (acquired), left foot: Secondary | ICD-10-CM | POA: Diagnosis not present

## 2014-04-09 DIAGNOSIS — J301 Allergic rhinitis due to pollen: Secondary | ICD-10-CM | POA: Diagnosis not present

## 2014-04-21 ENCOUNTER — Non-Acute Institutional Stay (SKILLED_NURSING_FACILITY): Payer: Medicare Other | Admitting: Adult Health

## 2014-04-21 DIAGNOSIS — G250 Essential tremor: Secondary | ICD-10-CM | POA: Diagnosis not present

## 2014-04-21 DIAGNOSIS — F329 Major depressive disorder, single episode, unspecified: Secondary | ICD-10-CM | POA: Diagnosis not present

## 2014-04-21 DIAGNOSIS — E1169 Type 2 diabetes mellitus with other specified complication: Secondary | ICD-10-CM | POA: Diagnosis not present

## 2014-04-21 DIAGNOSIS — K219 Gastro-esophageal reflux disease without esophagitis: Secondary | ICD-10-CM

## 2014-04-21 DIAGNOSIS — F209 Schizophrenia, unspecified: Secondary | ICD-10-CM | POA: Diagnosis not present

## 2014-04-21 DIAGNOSIS — E119 Type 2 diabetes mellitus without complications: Secondary | ICD-10-CM | POA: Diagnosis not present

## 2014-04-21 DIAGNOSIS — E785 Hyperlipidemia, unspecified: Secondary | ICD-10-CM

## 2014-04-21 DIAGNOSIS — F0390 Unspecified dementia without behavioral disturbance: Secondary | ICD-10-CM | POA: Diagnosis not present

## 2014-04-21 DIAGNOSIS — F32A Depression, unspecified: Secondary | ICD-10-CM

## 2014-05-01 ENCOUNTER — Non-Acute Institutional Stay (SKILLED_NURSING_FACILITY): Payer: Medicare Other | Admitting: Internal Medicine

## 2014-05-01 DIAGNOSIS — J301 Allergic rhinitis due to pollen: Secondary | ICD-10-CM | POA: Diagnosis not present

## 2014-05-01 DIAGNOSIS — F0390 Unspecified dementia without behavioral disturbance: Secondary | ICD-10-CM | POA: Diagnosis not present

## 2014-05-01 DIAGNOSIS — T148XXA Other injury of unspecified body region, initial encounter: Secondary | ICD-10-CM

## 2014-05-01 DIAGNOSIS — T148 Other injury of unspecified body region: Secondary | ICD-10-CM | POA: Diagnosis not present

## 2014-05-01 NOTE — Progress Notes (Signed)
Patient ID: Debra Tran, female   DOB: [birth year], 81 y.o.   MRN: [id number]    [place]    Place of Service:   SNF    Allergies  Allergen Reactions  . Ether Nausea And Vomiting    Chief Complaint  Patient presents with  . Acute Visit    rash; cough    HPI:  81 yo female long term resident seen today for above. She reports a bruise on her right hand x unknown duration. No known trauma. She also has dry cough, rhinorrhea and sore throat that began on yesterday. No f/c, insect bites, CP, SOB, HA or dizziness.  Medications: Patient's Medications  New Prescriptions   No medications on file  Previous Medications   ASPIRIN EC 81 MG TABLET    Take 81 mg by mouth every morning.    ATORVASTATIN (LIPITOR) 20 MG TABLET    Take 1 tablet (20 mg total) by mouth at bedtime.   CALCIUM CARBONATE-VITAMIN D (CALCIUM 600-D) 600-400 MG-UNIT PER TABLET    Take 1 tablet by mouth every morning.    DOCUSATE SODIUM (COLACE) 100 MG CAPSULE    Take 100 mg by mouth 2 (two) times daily.   DONEPEZIL (ARICEPT) 5 MG TABLET    Take 10 mg by mouth at bedtime.    ESCITALOPRAM (LEXAPRO) 10 MG TABLET    Take 40 mg by mouth at bedtime.    FLUTICASONE (FLONASE) 50 MCG/ACT NASAL SPRAY    Place 2 sprays into the nose daily as needed.    INSULIN GLARGINE (LANTUS) 100 UNIT/ML INJECTION    Inject 10 Units into the skin at bedtime.   KETOTIFEN (ZADITOR) 0.025 % OPHTHALMIC SOLUTION    Place 1 drop into both eyes 2 (two) times daily.   MEMANTINE HCL ER (NAMENDA XR) 28 MG CP24    Take 28 mg by mouth daily.   METFORMIN (GLUCOPHAGE) 500 MG TABLET    Take 2 tablets (1,000 mg total) by mouth 2 (two) times daily with a meal.   MULTIPLE VITAMIN (MULTIVITAMIN WITH MINERALS) TABS    Take 1 tablet by mouth every morning.   NORTRIPTYLINE (PAMELOR) 75 MG CAPSULE    Take 75 mg by mouth at bedtime.   OLANZAPINE (ZYPREXA) 5 MG TABLET    Take 15 mg by mouth at bedtime.   OMEPRAZOLE (PRILOSEC) 20 MG CAPSULE     Take 1 capsule (20 mg total) by mouth 2 (two) times daily.   PRIMIDONE (MYSOLINE) 50 MG TABLET    Take 25 mg by mouth at bedtime.  Modified Medications   No medications on file  Discontinued Medications   No medications on file     Review of Systems  As above. All other systems reviewed are negative. She does have a hx of dementia Filed Vitals:   04/29/14 2049  BP: 132/76  Pulse: 76  Temp: 97.9 F (36.6 C)   There is no weight on file to calculate BMI.  Physical Exam   CONSTITUTIONAL: Looks well in NAD. Awake and alert HEENT: PERRLA. Oropharynx clear and without exudate NECK: Supple. Nontender. No palpable cervical or supraclavicular lymph nodes. CVS: Regular rate without murmur, gallop or rub. LUNGS: CTA b/l no wheezing, rales or rhonchi. MUSC/SKIN: FROM right thumb. Dorsolateral ecchymosis of right thumb. No vesicular formation. No d/c PSYCH: Affect, behavior and mood normal  Labs reviewed: Nursing Home on 02/17/2014  Component Date Value Ref Range Status  . Glucose 01/29/2014 123  Final  . BUN 01/29/2014 13  4 - 21 mg/dL Final  . Creatinine 01/29/2014 0.8  0.5 - 1.1 mg/dL Final  . Potassium 01/29/2014 4.2  3.4 - 5.3 mmol/L Final  . Sodium 01/29/2014 143  137 - 147 mmol/L Final  . Hgb A1c MFr Bld 01/29/2014 6.7* 4.0 - 6.0 % Final     Assessment/Plan   ICD-9-CM ICD-10-CM   1. Allergic rhinitis due to pollen 477.0 J30.1   2. Contusion 924.9 T14.8    dorsal right hand  3. Dementia, without behavioral disturbance 294.20 F03.90    --start zyrtec 10mg  daily for seasonal allergy  --continue flonase as ordered  --reassurance given for hand contusion  --continue all other medications and tx plan as ordered   [facility] S. Perlie Gold  [facility] [address] [place], [place] [address] [phone] Office (Wednesdays and Fridays 8 AM - 5 PM) [phone] Cell (Monday-Friday 8 AM - 5 PM)

## 2014-05-02 ENCOUNTER — Encounter: Payer: Self-pay | Admitting: Internal Medicine

## 2014-05-07 DIAGNOSIS — F0391 Unspecified dementia with behavioral disturbance: Secondary | ICD-10-CM | POA: Diagnosis not present

## 2014-05-07 DIAGNOSIS — F329 Major depressive disorder, single episode, unspecified: Secondary | ICD-10-CM | POA: Diagnosis not present

## 2014-05-07 DIAGNOSIS — F259 Schizoaffective disorder, unspecified: Secondary | ICD-10-CM | POA: Diagnosis not present

## 2014-05-10 DIAGNOSIS — M533 Sacrococcygeal disorders, not elsewhere classified: Secondary | ICD-10-CM | POA: Diagnosis not present

## 2014-05-10 DIAGNOSIS — F209 Schizophrenia, unspecified: Secondary | ICD-10-CM | POA: Diagnosis not present

## 2014-05-10 DIAGNOSIS — L603 Nail dystrophy: Secondary | ICD-10-CM | POA: Diagnosis not present

## 2014-05-10 DIAGNOSIS — E1140 Type 2 diabetes mellitus with diabetic neuropathy, unspecified: Secondary | ICD-10-CM | POA: Diagnosis not present

## 2014-05-10 DIAGNOSIS — F329 Major depressive disorder, single episode, unspecified: Secondary | ICD-10-CM | POA: Diagnosis not present

## 2014-05-10 DIAGNOSIS — L570 Actinic keratosis: Secondary | ICD-10-CM | POA: Diagnosis not present

## 2014-05-10 DIAGNOSIS — H3532 Exudative age-related macular degeneration: Secondary | ICD-10-CM | POA: Diagnosis not present

## 2014-05-10 DIAGNOSIS — E118 Type 2 diabetes mellitus with unspecified complications: Secondary | ICD-10-CM | POA: Diagnosis not present

## 2014-05-10 DIAGNOSIS — E1169 Type 2 diabetes mellitus with other specified complication: Secondary | ICD-10-CM | POA: Diagnosis not present

## 2014-05-10 DIAGNOSIS — Z1231 Encounter for screening mammogram for malignant neoplasm of breast: Secondary | ICD-10-CM | POA: Diagnosis not present

## 2014-05-10 DIAGNOSIS — D518 Other vitamin B12 deficiency anemias: Secondary | ICD-10-CM | POA: Diagnosis not present

## 2014-05-10 DIAGNOSIS — D049 Carcinoma in situ of skin, unspecified: Secondary | ICD-10-CM | POA: Diagnosis not present

## 2014-05-10 DIAGNOSIS — Z87891 Personal history of nicotine dependence: Secondary | ICD-10-CM | POA: Diagnosis not present

## 2014-05-10 DIAGNOSIS — M79674 Pain in right toe(s): Secondary | ICD-10-CM | POA: Diagnosis not present

## 2014-05-10 DIAGNOSIS — Z029 Encounter for administrative examinations, unspecified: Secondary | ICD-10-CM | POA: Diagnosis not present

## 2014-05-10 DIAGNOSIS — M79676 Pain in unspecified toe(s): Secondary | ICD-10-CM | POA: Diagnosis not present

## 2014-05-10 DIAGNOSIS — E559 Vitamin D deficiency, unspecified: Secondary | ICD-10-CM | POA: Diagnosis not present

## 2014-05-10 DIAGNOSIS — R350 Frequency of micturition: Secondary | ICD-10-CM | POA: Diagnosis not present

## 2014-05-10 DIAGNOSIS — K219 Gastro-esophageal reflux disease without esophagitis: Secondary | ICD-10-CM | POA: Diagnosis not present

## 2014-05-10 DIAGNOSIS — M154 Erosive (osteo)arthritis: Secondary | ICD-10-CM | POA: Diagnosis not present

## 2014-05-10 DIAGNOSIS — H35033 Hypertensive retinopathy, bilateral: Secondary | ICD-10-CM | POA: Diagnosis not present

## 2014-05-10 DIAGNOSIS — E119 Type 2 diabetes mellitus without complications: Secondary | ICD-10-CM | POA: Diagnosis not present

## 2014-05-10 DIAGNOSIS — Z8619 Personal history of other infectious and parasitic diseases: Secondary | ICD-10-CM | POA: Diagnosis not present

## 2014-05-10 DIAGNOSIS — I2510 Atherosclerotic heart disease of native coronary artery without angina pectoris: Secondary | ICD-10-CM | POA: Diagnosis not present

## 2014-05-10 DIAGNOSIS — M1991 Primary osteoarthritis, unspecified site: Secondary | ICD-10-CM | POA: Diagnosis not present

## 2014-05-10 DIAGNOSIS — Z8742 Personal history of other diseases of the female genital tract: Secondary | ICD-10-CM | POA: Diagnosis not present

## 2014-05-10 DIAGNOSIS — F200 Paranoid schizophrenia: Secondary | ICD-10-CM | POA: Diagnosis not present

## 2014-05-10 DIAGNOSIS — G250 Essential tremor: Secondary | ICD-10-CM | POA: Diagnosis not present

## 2014-05-10 DIAGNOSIS — M79675 Pain in left toe(s): Secondary | ICD-10-CM | POA: Diagnosis not present

## 2014-05-10 DIAGNOSIS — B958 Unspecified staphylococcus as the cause of diseases classified elsewhere: Secondary | ICD-10-CM | POA: Diagnosis not present

## 2014-05-10 DIAGNOSIS — M2042 Other hammer toe(s) (acquired), left foot: Secondary | ICD-10-CM | POA: Diagnosis not present

## 2014-05-10 DIAGNOSIS — D485 Neoplasm of uncertain behavior of skin: Secondary | ICD-10-CM | POA: Diagnosis not present

## 2014-05-10 DIAGNOSIS — D520 Dietary folate deficiency anemia: Secondary | ICD-10-CM | POA: Diagnosis not present

## 2014-05-10 DIAGNOSIS — H3531 Nonexudative age-related macular degeneration: Secondary | ICD-10-CM | POA: Diagnosis not present

## 2014-05-10 DIAGNOSIS — E785 Hyperlipidemia, unspecified: Secondary | ICD-10-CM | POA: Diagnosis not present

## 2014-05-10 DIAGNOSIS — F0390 Unspecified dementia without behavioral disturbance: Secondary | ICD-10-CM | POA: Diagnosis not present

## 2014-05-10 DIAGNOSIS — F0391 Unspecified dementia with behavioral disturbance: Secondary | ICD-10-CM | POA: Diagnosis not present

## 2014-05-10 DIAGNOSIS — R262 Difficulty in walking, not elsewhere classified: Secondary | ICD-10-CM | POA: Diagnosis not present

## 2014-05-10 DIAGNOSIS — M2041 Other hammer toe(s) (acquired), right foot: Secondary | ICD-10-CM | POA: Diagnosis not present

## 2014-05-10 DIAGNOSIS — E1159 Type 2 diabetes mellitus with other circulatory complications: Secondary | ICD-10-CM | POA: Diagnosis not present

## 2014-05-10 DIAGNOSIS — L989 Disorder of the skin and subcutaneous tissue, unspecified: Secondary | ICD-10-CM | POA: Diagnosis not present

## 2014-05-10 DIAGNOSIS — R293 Abnormal posture: Secondary | ICD-10-CM | POA: Diagnosis not present

## 2014-05-10 DIAGNOSIS — G2401 Drug induced subacute dyskinesia: Secondary | ICD-10-CM | POA: Diagnosis not present

## 2014-05-10 DIAGNOSIS — I10 Essential (primary) hypertension: Secondary | ICD-10-CM | POA: Diagnosis not present

## 2014-05-10 DIAGNOSIS — L98411 Non-pressure chronic ulcer of buttock limited to breakdown of skin: Secondary | ICD-10-CM | POA: Diagnosis not present

## 2014-05-10 DIAGNOSIS — Z79899 Other long term (current) drug therapy: Secondary | ICD-10-CM | POA: Diagnosis not present

## 2014-05-10 DIAGNOSIS — E669 Obesity, unspecified: Secondary | ICD-10-CM | POA: Diagnosis not present

## 2014-05-10 DIAGNOSIS — Z862 Personal history of diseases of the blood and blood-forming organs and certain disorders involving the immune mechanism: Secondary | ICD-10-CM | POA: Diagnosis not present

## 2014-05-10 DIAGNOSIS — D649 Anemia, unspecified: Secondary | ICD-10-CM | POA: Diagnosis not present

## 2014-05-10 DIAGNOSIS — Z7982 Long term (current) use of aspirin: Secondary | ICD-10-CM | POA: Diagnosis not present

## 2014-05-10 DIAGNOSIS — K5900 Constipation, unspecified: Secondary | ICD-10-CM | POA: Diagnosis not present

## 2014-05-10 DIAGNOSIS — H3530 Unspecified macular degeneration: Secondary | ICD-10-CM | POA: Diagnosis not present

## 2014-05-10 DIAGNOSIS — M79671 Pain in right foot: Secondary | ICD-10-CM | POA: Diagnosis not present

## 2014-05-10 DIAGNOSIS — E039 Hypothyroidism, unspecified: Secondary | ICD-10-CM | POA: Diagnosis not present

## 2014-05-10 DIAGNOSIS — H269 Unspecified cataract: Secondary | ICD-10-CM | POA: Diagnosis not present

## 2014-05-10 DIAGNOSIS — B351 Tinea unguium: Secondary | ICD-10-CM | POA: Diagnosis not present

## 2014-05-10 DIAGNOSIS — R1310 Dysphagia, unspecified: Secondary | ICD-10-CM | POA: Diagnosis not present

## 2014-05-10 DIAGNOSIS — M2040 Other hammer toe(s) (acquired), unspecified foot: Secondary | ICD-10-CM | POA: Diagnosis not present

## 2014-05-10 DIAGNOSIS — Z7951 Long term (current) use of inhaled steroids: Secondary | ICD-10-CM | POA: Diagnosis not present

## 2014-05-10 DIAGNOSIS — L89309 Pressure ulcer of unspecified buttock, unspecified stage: Secondary | ICD-10-CM | POA: Diagnosis not present

## 2014-05-10 DIAGNOSIS — Z794 Long term (current) use of insulin: Secondary | ICD-10-CM | POA: Diagnosis not present

## 2014-05-10 DIAGNOSIS — L03115 Cellulitis of right lower limb: Secondary | ICD-10-CM | POA: Diagnosis not present

## 2014-05-10 DIAGNOSIS — H43813 Vitreous degeneration, bilateral: Secondary | ICD-10-CM | POA: Diagnosis not present

## 2014-05-10 DIAGNOSIS — M6281 Muscle weakness (generalized): Secondary | ICD-10-CM | POA: Diagnosis not present

## 2014-05-10 DIAGNOSIS — R51 Headache: Secondary | ICD-10-CM | POA: Diagnosis not present

## 2014-05-10 DIAGNOSIS — M150 Primary generalized (osteo)arthritis: Secondary | ICD-10-CM | POA: Diagnosis not present

## 2014-05-10 DIAGNOSIS — Z8262 Family history of osteoporosis: Secondary | ICD-10-CM | POA: Diagnosis not present

## 2014-05-10 DIAGNOSIS — F259 Schizoaffective disorder, unspecified: Secondary | ICD-10-CM | POA: Diagnosis not present

## 2014-05-10 DIAGNOSIS — R531 Weakness: Secondary | ICD-10-CM | POA: Diagnosis present

## 2014-05-10 DIAGNOSIS — N393 Stress incontinence (female) (male): Secondary | ICD-10-CM | POA: Diagnosis not present

## 2014-05-10 DIAGNOSIS — M8580 Other specified disorders of bone density and structure, unspecified site: Secondary | ICD-10-CM | POA: Diagnosis not present

## 2014-05-11 ENCOUNTER — Encounter: Payer: Self-pay | Admitting: Adult Health

## 2014-05-11 DIAGNOSIS — K219 Gastro-esophageal reflux disease without esophagitis: Secondary | ICD-10-CM | POA: Insufficient documentation

## 2014-05-11 NOTE — Progress Notes (Signed)
Patient ID: Debra Tran, female   DOB: 1928/01/23, 81 y.o.   MRN: [id number]  [facility]     Allergies  Allergen Reactions  . Ether Nausea And Vomiting       Chief Complaint  Patient presents with  . Medical Management of Chronic Issues    HPI:  She is a long term resident of this facility being seen for the management of her chronic illnesses. Overall her status remains stable. She is not voicing any complaints or concerns. There are no nursing concerns being voiced.    Past Medical History  Diagnosis Date  . Phobia   . Macular degeneration   . Narcissism   . Schizophrenia, paranoid   . Fungal dermatitis   . Hyperlipidemia   . Mild cognitive impairment   . Diabetes mellitus type 2 in obese   . Stress incontinence, female   . Allergic rhinitis   . Normocytic anemia   . Depressive disorder   . Hypertension   . CATARACT 06/06/2006    Qualifier: Diagnosis of  By: Genene Churn MD, Janett Billow      Past Surgical History  Procedure Laterality Date  . Cataract extraction  6 16 2006  . Cryotherapy  2 15 2006    facial AK  . Total abdominal hysterectomy w/ bilateral salpingoophorectomy      due to endometriosis  non malignant  . Hyperplastic   4 20 2006    AK shave biopsy  . Abdominal hysterectomy      due to fibroids    VITAL SIGNS BP 129/69 mmHg  Pulse 70  Ht 5' (1.524 m)  Wt 183 lb (83.008 kg)  BMI 35.74 kg/m2  SpO2 97%   Outpatient Encounter Prescriptions as of 04/21/2014  Medication Sig  . aspirin EC 81 MG tablet Take 81 mg by mouth every morning.   Marland Kitchen atorvastatin (LIPITOR) 20 MG tablet Take 1 tablet (20 mg total) by mouth at bedtime.  . Calcium Carbonate-Vitamin D (CALCIUM 600-D) 600-400 MG-UNIT per tablet Take 1 tablet by mouth every morning.   . docusate sodium (COLACE) 100 MG capsule Take 100 mg by mouth 2 (two) times daily.  Marland Kitchen donepezil (ARICEPT) 5 MG tablet Take 10 mg by mouth at bedtime.   Marland Kitchen escitalopram (LEXAPRO) 10 MG tablet Take 40 mg by mouth at  bedtime.   . fluticasone (FLONASE) 50 MCG/ACT nasal spray Place 2 sprays into the nose daily as needed.   . insulin glargine (LANTUS) 100 UNIT/ML injection Inject 10 Units into the skin at bedtime.  Marland Kitchen ketotifen (ZADITOR) 0.025 % ophthalmic solution Place 1 drop into both eyes 2 (two) times daily.  . Memantine HCl ER (NAMENDA XR) 28 MG CP24 Take 28 mg by mouth daily.  . metFORMIN (GLUCOPHAGE) 500 MG tablet Take 2 tablets (1,000 mg total) by mouth 2 (two) times daily with a meal.  . Multiple Vitamin (MULTIVITAMIN WITH MINERALS) TABS Take 1 tablet by mouth every morning.  . nortriptyline (PAMELOR) 75 MG capsule Take 75 mg by mouth at bedtime.  Marland Kitchen OLANZapine (ZYPREXA) 5 MG tablet Take 15 mg by mouth at bedtime.  Marland Kitchen omeprazole (PRILOSEC) 20 MG capsule Take 1 capsule (20 mg total) by mouth 2 (two) times daily.  . primidone (MYSOLINE) 50 MG tablet Take 25 mg by mouth at bedtime.     SIGNIFICANT DIAGNOSTIC EXAMS  03-07-14: chest x-ray: right lower lobe mass (2.5 cm)   03-19-14: ct of chest: No parenchymal mass is noted within the lungs. No acute  abnormality noted.   LABS REVIEWED:  10-21-13: chol 155; ldl 82; trig 133 01-29-14: glucose 123; bun 12.6; creat 0.79; k+4.2; na++ 143; hgb a1c 6.8;  Urine micro-albumin: 1.2 02-19-14: tsh 2.66  03-10-14: wbc 8.1; hgb 11.7; hct 39.8; mcv 86.6; plt 243; glucose 176; bun 12.6; creat 0.74; k+4.3; na++143       ROS Constitutional: Negative for malaise/fatigue.  Respiratory: negative for cough or shortness of breath  Cardiovascular: Negative for chest pain, palpitations and leg swelling.  Gastrointestinal: Negative for heartburn, abdominal pain and constipation.  Musculoskeletal: Negative for myalgias and joint pain.  Skin: Negative.   Psychiatric/Behavioral: The patient is not nervous/anxious   Physical Exam Constitutional: No distress.  Over weight   Neck: Neck supple. No JVD present.  Cardiovascular: Normal rate, regular rhythm and intact  distal pulses.   Respiratory: Effort normal. Lungs clear  No respiratory distress. Marland Kitchen  GI: Soft. Bowel sounds are normal. She exhibits no distension. There is no tenderness.  Musculoskeletal: She exhibits no edema.  Is able to move all extremities   Neurological: She is alert.  Skin: Skin is warm and dry. She is not diaphoretic   ASSESSMENT/ PLAN:  1. Dementia without behavior disturbance; is without change will continue aricept 10 mg daily and namenda xr 28 mg daily will not make changes will monitor   2. Tremor is stable will continue primidone 25 mg nightly   3. Dyslipidemia: will continue lipitor 20 mg daily ldl is 82  4. Diabetes is stable is taking  lantus 10 units nightly; will continue humalog 5 units prior to meals with an additional 3 units for cbg >=150; metformin 1 gm twice daily her hgb a1c is 6.8. Will stop the humalog and will monitor her status.   5. [name]: will continue prilosec 20 mg twice daily   6. Constipation: will continue colace twice daily   7. Depression: will continue lexapro 10 mg nightly and pamelor 75 mg nightly and will monitor   8. Schizophrenia: is stable will continue zyprexa 15 mg nightly and will monitor      Ok Edwards NP [facility]  Contact [phone] Monday through Friday 8am- 5pm  After hours call [phone]

## 2014-05-20 ENCOUNTER — Non-Acute Institutional Stay (SKILLED_NURSING_FACILITY): Payer: Medicare Other | Admitting: Adult Health

## 2014-05-20 DIAGNOSIS — F329 Major depressive disorder, single episode, unspecified: Secondary | ICD-10-CM | POA: Diagnosis not present

## 2014-05-20 DIAGNOSIS — E785 Hyperlipidemia, unspecified: Secondary | ICD-10-CM | POA: Diagnosis not present

## 2014-05-20 DIAGNOSIS — E119 Type 2 diabetes mellitus without complications: Secondary | ICD-10-CM | POA: Diagnosis not present

## 2014-05-20 DIAGNOSIS — F209 Schizophrenia, unspecified: Secondary | ICD-10-CM

## 2014-05-20 DIAGNOSIS — G250 Essential tremor: Secondary | ICD-10-CM

## 2014-05-20 DIAGNOSIS — E1169 Type 2 diabetes mellitus with other specified complication: Secondary | ICD-10-CM | POA: Diagnosis not present

## 2014-05-20 DIAGNOSIS — K5909 Other constipation: Secondary | ICD-10-CM

## 2014-05-20 DIAGNOSIS — F0390 Unspecified dementia without behavioral disturbance: Secondary | ICD-10-CM | POA: Diagnosis not present

## 2014-05-20 DIAGNOSIS — K219 Gastro-esophageal reflux disease without esophagitis: Secondary | ICD-10-CM | POA: Diagnosis not present

## 2014-05-20 DIAGNOSIS — F32A Depression, unspecified: Secondary | ICD-10-CM

## 2014-05-20 DIAGNOSIS — K5900 Constipation, unspecified: Secondary | ICD-10-CM

## 2014-05-21 DIAGNOSIS — F0391 Unspecified dementia with behavioral disturbance: Secondary | ICD-10-CM | POA: Diagnosis not present

## 2014-05-21 DIAGNOSIS — F259 Schizoaffective disorder, unspecified: Secondary | ICD-10-CM | POA: Diagnosis not present

## 2014-05-21 DIAGNOSIS — F329 Major depressive disorder, single episode, unspecified: Secondary | ICD-10-CM | POA: Diagnosis not present

## 2014-05-21 LAB — LIPID PANEL
CHOLESTEROL: 158 mg/dL (ref 0–200)
HDL: 45 mg/dL (ref 35–70)
LDL Cholesterol: 80 mg/dL
Triglycerides: 169 mg/dL — AB (ref 40–160)

## 2014-05-21 LAB — BASIC METABOLIC PANEL
BUN: 14 mg/dL (ref 4–21)
CREATININE: 0.8 mg/dL (ref <=1.1)
Glucose: 226 mg/dL
POTASSIUM: 4.4 mmol/L (ref 3.4–5.3)
SODIUM: 140 mmol/L (ref 137–147)

## 2014-05-21 LAB — HEPATIC FUNCTION PANEL
ALT: 16 U/L (ref 7–35)
AST: 18 U/L (ref 13–35)

## 2014-06-02 DIAGNOSIS — F259 Schizoaffective disorder, unspecified: Secondary | ICD-10-CM | POA: Diagnosis not present

## 2014-06-02 DIAGNOSIS — F0391 Unspecified dementia with behavioral disturbance: Secondary | ICD-10-CM | POA: Diagnosis not present

## 2014-06-02 DIAGNOSIS — F329 Major depressive disorder, single episode, unspecified: Secondary | ICD-10-CM | POA: Diagnosis not present

## 2014-06-16 ENCOUNTER — Non-Acute Institutional Stay (SKILLED_NURSING_FACILITY): Payer: Medicare Other | Admitting: Adult Health

## 2014-06-16 DIAGNOSIS — E785 Hyperlipidemia, unspecified: Secondary | ICD-10-CM | POA: Diagnosis not present

## 2014-06-16 DIAGNOSIS — E119 Type 2 diabetes mellitus without complications: Secondary | ICD-10-CM | POA: Diagnosis not present

## 2014-06-16 DIAGNOSIS — E1169 Type 2 diabetes mellitus with other specified complication: Secondary | ICD-10-CM | POA: Diagnosis not present

## 2014-06-16 DIAGNOSIS — F32A Depression, unspecified: Secondary | ICD-10-CM

## 2014-06-16 DIAGNOSIS — F209 Schizophrenia, unspecified: Secondary | ICD-10-CM | POA: Diagnosis not present

## 2014-06-16 DIAGNOSIS — G250 Essential tremor: Secondary | ICD-10-CM | POA: Diagnosis not present

## 2014-06-16 DIAGNOSIS — F329 Major depressive disorder, single episode, unspecified: Secondary | ICD-10-CM

## 2014-06-16 DIAGNOSIS — F0390 Unspecified dementia without behavioral disturbance: Secondary | ICD-10-CM

## 2014-06-21 DIAGNOSIS — E1159 Type 2 diabetes mellitus with other circulatory complications: Secondary | ICD-10-CM | POA: Diagnosis not present

## 2014-06-21 DIAGNOSIS — B351 Tinea unguium: Secondary | ICD-10-CM | POA: Diagnosis not present

## 2014-06-21 DIAGNOSIS — R262 Difficulty in walking, not elsewhere classified: Secondary | ICD-10-CM | POA: Diagnosis not present

## 2014-06-21 DIAGNOSIS — M154 Erosive (osteo)arthritis: Secondary | ICD-10-CM | POA: Diagnosis not present

## 2014-06-21 DIAGNOSIS — M79676 Pain in unspecified toe(s): Secondary | ICD-10-CM | POA: Diagnosis not present

## 2014-07-03 ENCOUNTER — Encounter: Payer: Self-pay | Admitting: Adult Health

## 2014-07-03 DIAGNOSIS — K5909 Other constipation: Secondary | ICD-10-CM | POA: Insufficient documentation

## 2014-07-03 NOTE — Progress Notes (Signed)
Patient ID: Debra Tran, female   DOB: Jan 26, 1928, 81 y.o.   MRN: [id number]  [facility]     Allergies  Allergen Reactions  . Ether Nausea And Vomiting       Chief Complaint  Patient presents with  . Medical Management of Chronic Issues    HPI:  She is a long term resident of this facility being seen for the management of her chronic illnesses.overall she remains stable. She states that she is feeling good and does not have any complaints. There are no nursing concerns at this time.    Past Medical History  Diagnosis Date  . Phobia   . Macular degeneration   . Narcissism   . Schizophrenia, paranoid   . Fungal dermatitis   . Hyperlipidemia   . Mild cognitive impairment   . Diabetes mellitus type 2 in obese   . Stress incontinence, female   . Allergic rhinitis   . Normocytic anemia   . Depressive disorder   . Hypertension   . CATARACT 06/06/2006    Qualifier: Diagnosis of  By: Genene Churn MD, Janett Billow      Past Surgical History  Procedure Laterality Date  . Cataract extraction  6 16 2006  . Cryotherapy  2 15 2006    facial AK  . Total abdominal hysterectomy w/ bilateral salpingoophorectomy      due to endometriosis  non malignant  . Hyperplastic   4 20 2006    AK shave biopsy  . Abdominal hysterectomy      due to fibroids    VITAL SIGNS BP 130/71 mmHg  Pulse 76  Ht 4\' 10"  (1.473 m)  Wt 180 lb (81.647 kg)  BMI 37.63 kg/m2  SpO2 98%   Outpatient Encounter Prescriptions as of 05/20/2014  Medication Sig  . aspirin EC 81 MG tablet Take 81 mg by mouth every morning.   Marland Kitchen atorvastatin (LIPITOR) 20 MG tablet Take 1 tablet (20 mg total) by mouth at bedtime.  . Calcium Carbonate-Vitamin D (CALCIUM 600-D) 600-400 MG-UNIT per tablet Take 1 tablet by mouth every morning.   . docusate sodium (COLACE) 100 MG capsule Take 100 mg by mouth 2 (two) times daily.  Marland Kitchen donepezil (ARICEPT) 5 MG tablet Take 10 mg by mouth at bedtime.   Marland Kitchen escitalopram (LEXAPRO) 10 MG tablet Take  40 mg by mouth at bedtime.   . fluticasone (FLONASE) 50 MCG/ACT nasal spray Place 2 sprays into the nose daily as needed.   . insulin glargine (LANTUS) 100 UNIT/ML injection Inject 10 Units into the skin at bedtime.  Marland Kitchen ketotifen (ZADITOR) 0.025 % ophthalmic solution Place 1 drop into both eyes 2 (two) times daily.  . Memantine HCl ER (NAMENDA XR) 28 MG CP24 Take 28 mg by mouth daily.  . metFORMIN (GLUCOPHAGE) 500 MG tablet Take 2 tablets (1,000 mg total) by mouth 2 (two) times daily with a meal.  . Multiple Vitamin (MULTIVITAMIN WITH MINERALS) TABS Take 1 tablet by mouth every morning.  . nortriptyline (PAMELOR) 75 MG capsule Take 75 mg by mouth at bedtime.  Marland Kitchen OLANZapine (ZYPREXA) 5 MG tablet Take 15 mg by mouth at bedtime.  Marland Kitchen omeprazole (PRILOSEC) 20 MG capsule Take 1 capsule (20 mg total) by mouth 2 (two) times daily.  . primidone (MYSOLINE) 50 MG tablet Take 25 mg by mouth at bedtime.     SIGNIFICANT DIAGNOSTIC EXAMS   03-07-14: chest x-ray: right lower lobe mass (2.5 cm)   03-19-14: ct of chest: No parenchymal mass is  noted within the lungs. No acute abnormality noted.   LABS REVIEWED:  10-21-13: chol 155; ldl 82; trig 133 01-29-14: glucose 123; bun 12.6; creat 0.79; k+4.2; na++ 143; hgb a1c 6.8;  Urine micro-albumin: 1.2 02-19-14: tsh 2.66  03-10-14: wbc 8.1; hgb 11.7; hct 39.8; mcv 86.6; plt 243; glucose 176; bun 12.6; creat 0.74; k+4.3; na++143     ROS Constitutional: Negative for malaise/fatigue.  Respiratory: negative for cough or shortness of breath  Cardiovascular: Negative for chest pain, palpitations and leg swelling.  Gastrointestinal: Negative for heartburn, abdominal pain and constipation.  Musculoskeletal: Negative for myalgias and joint pain.  Skin: Negative.   Psychiatric/Behavioral: The patient is not nervous/anxious   Physical Exam Constitutional: No distress.  Over weight   Neck: Neck supple. No JVD present.  Cardiovascular: Normal rate, regular  rhythm and intact distal pulses.   Respiratory: Effort normal. Lungs clear  No respiratory distress. Marland Kitchen  GI: Soft. Bowel sounds are normal. She exhibits no distension. There is no tenderness.  Musculoskeletal: She exhibits no edema.  Is able to move all extremities   Neurological: She is alert.  Skin: Skin is warm and dry. She is not diaphoretic     ASSESSMENT/ PLAN:  1. Dementia without behavior disturbance; is without change will continue aricept 10 mg daily and namenda xr 28 mg daily will not make changes will monitor   2. Tremor is stable will continue primidone 25 mg nightly   3. Dyslipidemia: will continue lipitor 20 mg daily ldl is 82  4. Diabetes is stable is taking  lantus 10 units nightly; will continue humalog 5 units prior to meals with an additional 3 units for cbg >=150; metformin 1 gm twice daily her hgb a1c is 6.8. Will stop the humalog and will monitor her status.   5. [name]: will continue prilosec 20 mg twice daily   6. Constipation: will continue colace twice daily   7. Depression: will continue lexapro 10 mg nightly and pamelor 75 mg nightly and will monitor   8. Schizophrenia: is stable will continue zyprexa 15 mg nightly and will monitor   9. Macular degeneration: will contine maxitrol oint to right eye four times daily; zaditor to both eyes twice daily     Will check cmp lipids; hgb a1c; will place her on the eye doctor and foot doctor lists.    Ok Edwards NP [facility]  Contact [phone] Monday through Friday 8am- 5pm  After hours call [phone]

## 2014-07-13 ENCOUNTER — Ambulatory Visit (INDEPENDENT_AMBULATORY_CARE_PROVIDER_SITE_OTHER): Payer: Medicare Other | Admitting: Podiatry

## 2014-07-13 DIAGNOSIS — L603 Nail dystrophy: Secondary | ICD-10-CM

## 2014-07-13 DIAGNOSIS — M79673 Pain in unspecified foot: Secondary | ICD-10-CM

## 2014-07-13 NOTE — Progress Notes (Signed)
   Need to get toenails trimmed and my left heel has been hurting real bad      Review of Systems     Objective:   Physical Exam: Vital signs are stable he is alert and oriented. Pulses are palpable bilateral. His nails are thick yellow dystrophic clinically mycotic and painful palpation. His hammertoe deformities 2 through 5 bilateral. Reactive hyperkeratosis dorsal aspect of the PIPJ second left and reactive porokeratotic lesions plantar aspect of the left foot.        Assessment & Plan:  Assessment: Pain in limb secondary to onychomycosis and reactive hyperkeratosis bilateral.  Plan: Debridement of nails 1 through 5 bilateral is cover service secondary to pain. she was scanned for diabetic shoes.

## 2014-07-15 ENCOUNTER — Non-Acute Institutional Stay (SKILLED_NURSING_FACILITY): Payer: Medicare Other | Admitting: Internal Medicine

## 2014-07-15 DIAGNOSIS — M533 Sacrococcygeal disorders, not elsewhere classified: Secondary | ICD-10-CM

## 2014-07-15 DIAGNOSIS — I10 Essential (primary) hypertension: Secondary | ICD-10-CM | POA: Diagnosis not present

## 2014-07-15 DIAGNOSIS — L989 Disorder of the skin and subcutaneous tissue, unspecified: Secondary | ICD-10-CM

## 2014-07-15 DIAGNOSIS — F0390 Unspecified dementia without behavioral disturbance: Secondary | ICD-10-CM | POA: Diagnosis not present

## 2014-07-15 DIAGNOSIS — G250 Essential tremor: Secondary | ICD-10-CM | POA: Diagnosis not present

## 2014-07-15 DIAGNOSIS — F259 Schizoaffective disorder, unspecified: Secondary | ICD-10-CM

## 2014-07-15 DIAGNOSIS — E119 Type 2 diabetes mellitus without complications: Secondary | ICD-10-CM

## 2014-07-15 DIAGNOSIS — F209 Schizophrenia, unspecified: Secondary | ICD-10-CM

## 2014-07-15 NOTE — Progress Notes (Signed)
Patient ID: Debra Tran, female   DOB: 1928-03-31, 81 y.o.   MRN: [id number]   07/15/14  [facility]    Place of Service: SNF (31)   Allergies  Allergen Reactions  . Ether Nausea And Vomiting    Chief Complaint  Patient presents with  . Medical Management of Chronic Issues    HPI:  81 yo female long term resident seen today for f/u. Daughter is present and is c/a pt's facial skin lesions. She is scheduled to see dermatology at the end of the month. She has a skin tear on sacral area and reports pain. Wound care is following. No nursing issues. No recent falls. Pt is a poor historian due to dementia and schizophrenia. Hx obtained form daughter and chart  CBG 120 today. No low BS reactions. She is taking lantus and metformin. She takes a statin and ASA tx.  Dementia stable on aricept and namenda xr  Lexapro, pamelor and olanzapine controls schizophrenia and depression.  Seasonal allergy sx's controlled with zyrtec  She continues to f/u with eye specialist due to macular degeneration  Primidone controls tremors   FULL CODE   Past Medical History  Diagnosis Date  . Phobia   . Macular degeneration   . Narcissism   . Schizophrenia, paranoid   . Fungal dermatitis   . Hyperlipidemia   . Mild cognitive impairment   . Diabetes mellitus type 2 in obese   . Stress incontinence, female   . Allergic rhinitis   . Normocytic anemia   . Depressive disorder   . Hypertension   . CATARACT 06/06/2006    Qualifier: Diagnosis of  By: Genene Churn MD, Janett Billow     Past Surgical History  Procedure Laterality Date  . Cataract extraction  6 16 2006  . Cryotherapy  2 15 2006    facial AK  . Total abdominal hysterectomy w/ bilateral salpingoophorectomy      due to endometriosis  non malignant  . Hyperplastic   4 20 2006    AK shave biopsy  . Abdominal hysterectomy      due to fibroids   History   Social History  . Marital Status: Divorced    Spouse  Name: N/A  . Number of Children: N/A  . Years of Education: N/A   Occupational History  . Not on file.   Social History Main Topics  . Smoking status: Former Research scientist (life sciences)  . Smokeless tobacco: Not on file  . Alcohol Use: No  . Drug Use: No  . Sexual Activity: Not on file   Other Topics Concern  . Not on file   Social History Narrative   Lives in [facility] (assisted living), remotely smoked and chewed tobacco. Daughter([name])  drives her, independent of all ADL's    Medications: Patient's Medications  New Prescriptions   No medications on file  Previous Medications   ASPIRIN EC 81 MG TABLET    Take 81 mg by mouth every morning. For heart disease   ATORVASTATIN (LIPITOR) 20 MG TABLET    Take 1 tablet (20 mg total) by mouth at bedtime.   CALCIUM CARBONATE-VITAMIN D (CALCIUM 600-D) 600-400 MG-UNIT PER TABLET    Take 1 tablet by mouth every morning.    CETIRIZINE (ZYRTEC) 10 MG TABLET    Take 10 mg by mouth daily.   DOCUSATE SODIUM (COLACE) 100 MG CAPSULE    Take 100 mg by mouth 2 (two) times daily. For constipation   DONEPEZIL (ARICEPT) 10  MG TABLET    Take 10 mg by mouth at bedtime.   ESCITALOPRAM (LEXAPRO) 10 MG TABLET    Take 30 mg by mouth at bedtime. For depression   FLUTICASONE (FLONASE) 50 MCG/ACT NASAL SPRAY    Place 2 sprays into the nose daily as needed.    HYPROMELLOSE 0.4 % SOLN    Place 1 drop into both eyes every 8 (eight) hours as needed (dry eyes).   INSULIN GLARGINE (LANTUS) 100 UNIT/ML INJECTION    Inject 10 Units into the skin at bedtime. For diabetes   KETOTIFEN (ZADITOR) 0.025 % OPHTHALMIC SOLUTION    Place 1 drop into both eyes 2 (two) times daily.   MEMANTINE HCL ER (NAMENDA XR) 28 MG CP24    Take 28 mg by mouth daily. For dementia   METFORMIN (GLUCOPHAGE) 500 MG TABLET    Take 2 tablets (1,000 mg total) by mouth 2 (two) times daily with a meal.   MULTIPLE VITAMIN (MULTIVITAMIN WITH MINERALS) TABS    Take 1 tablet by mouth every morning.    NEOMYCIN-POLYMYXIN B-DEXAMETHASONE (MAXITROL) 3.5-10000-0.1 OINT    Place 1 application into the right eye 3 (three) times daily.   NORTRIPTYLINE (PAMELOR) 75 MG CAPSULE    Take 75 mg by mouth at bedtime. Depressive disorder   OLANZAPINE (ZYPREXA) 5 MG TABLET    Take 15 mg by mouth at bedtime.   OMEPRAZOLE (PRILOSEC) 20 MG CAPSULE    Take 1 capsule (20 mg total) by mouth 2 (two) times daily.   PRIMIDONE (MYSOLINE) 50 MG TABLET    Take 50 mg by mouth at bedtime. For tremors  Modified Medications   No medications on file  Discontinued Medications   DONEPEZIL (ARICEPT) 5 MG TABLET    Take 10 mg by mouth at bedtime. For dementia     Review of Systems  Unable to perform ROS: Dementia    Filed Vitals:   07/15/14 1536  BP: 124/74  Pulse: 72  Temp: 97.4 F (36.3 C)  SpO2: 98%   There is no weight on file to calculate BMI.  Physical Exam  Constitutional: She appears well-developed and well-nourished.  Sitting in w/c in NAD  HENT:  Mouth/Throat: Oropharynx is clear and moist. No oropharyngeal exudate.  Eyes: Pupils are equal, round, and reactive to light. No scleral icterus.  Neck: Neck supple. Carotid bruit is not present. No tracheal deviation present. No thyromegaly present.  Cardiovascular: Normal rate, regular rhythm, intact distal pulses and normal pulses.  Exam reveals no gallop and no friction rub.   Murmur heard.  Systolic murmur is present with a grade of 1/6  Trace LE edema b/l. No calf TTP  Pulmonary/Chest: Effort normal and breath sounds normal. No stridor. No respiratory distress. She has no wheezes. She has no rales.  Abdominal: Soft. Bowel sounds are normal. She exhibits no distension and no mass. There is no tenderness. There is no rebound and no guarding.  Lymphadenopathy:    She has no cervical adenopathy.  Neurological: She is alert.  Skin: Skin is warm and dry. No rash noted.     Psychiatric: She has a normal mood and affect. Her behavior is normal.      Labs reviewed: No visits with results within 3 Month(s) from this visit. Latest known visit with results is:  Nursing Home on 02/17/2014  Component Date Value Ref Range Status  . Glucose 01/29/2014 123   Final  . BUN 01/29/2014 13  4 - 21 mg/dL Final  .  Creatinine 01/29/2014 0.8  0.5 - 1.1 mg/dL Final  . Potassium 01/29/2014 4.2  3.4 - 5.3 mmol/L Final  . Sodium 01/29/2014 143  137 - 147 mmol/L Final  . Hgb A1c MFr Bld 01/29/2014 6.7* 4.0 - 6.0 % Final     Assessment/Plan   ICD-9-CM ICD-10-CM   1. Skin lesions 709.9 L98.9    as described above  2. Dementia, without behavioral disturbance stable; cont meds 294.20 F03.90   3. Schizophrenia, unspecified type - stable; cont  meds 295.90 F20.9   4. Controlled diabetes mellitus type II without complication - stable; cont meds 250.00 E11.9   5. Schizoaffective disorder, unspecified type - stable; cont meds 295.70 F25.9   6. HYPERTENSION, BENIGN SYSTEMIC - diet controlled 401.1 I10   7. Essential tremor - stable 333.1 G25.0   8.      Sacral pain due to skin tear  --PT to look into w/c cushion for better comfort  --continue wound care  --continue meds as ordered  --f/u with specialists as scheduled  --A1c q 6 mos to follow diabetes  --will follow   [place]. Perlie Gold  [facility] [address] [place], [place] [address] [phone] Office (Wednesdays and Fridays 8 AM - 5 PM) [phone] Cell (Monday-Friday 8 AM - 5 PM)

## 2014-07-21 ENCOUNTER — Ambulatory Visit (INDEPENDENT_AMBULATORY_CARE_PROVIDER_SITE_OTHER): Payer: Medicare Other | Admitting: Ophthalmology

## 2014-07-21 DIAGNOSIS — H3531 Nonexudative age-related macular degeneration: Secondary | ICD-10-CM

## 2014-07-21 DIAGNOSIS — H43813 Vitreous degeneration, bilateral: Secondary | ICD-10-CM

## 2014-07-21 DIAGNOSIS — H35033 Hypertensive retinopathy, bilateral: Secondary | ICD-10-CM | POA: Diagnosis not present

## 2014-07-21 DIAGNOSIS — I10 Essential (primary) hypertension: Secondary | ICD-10-CM

## 2014-07-21 DIAGNOSIS — H3532 Exudative age-related macular degeneration: Secondary | ICD-10-CM | POA: Diagnosis not present

## 2014-07-21 HISTORY — PX: OTHER SURGICAL HISTORY: SHX169

## 2014-07-23 NOTE — Progress Notes (Signed)
Patient ID: Debra Tran, female   DOB: 1927-06-28, 81 y.o.   MRN: [id number]  [facility]     Allergies  Allergen Reactions  . Ether Nausea And Vomiting       Chief Complaint  Patient presents with  . Medical Management of Chronic Issues    HPI:  She is a long term resident of this facility being seen for the management of her chronic illnesses. Overall her status remains stable. She states that she is feeling good and is not voicing any complaints or concerns. There are no nursing concerns today.    Past Medical History  Diagnosis Date  . Phobia   . Macular degeneration   . Narcissism   . Schizophrenia, paranoid   . Fungal dermatitis   . Hyperlipidemia   . Mild cognitive impairment   . Diabetes mellitus type 2 in obese   . Stress incontinence, female   . Allergic rhinitis   . Normocytic anemia   . Depressive disorder   . Hypertension   . CATARACT 06/06/2006    Qualifier: Diagnosis of  By: Genene Churn MD, Janett Billow      Past Surgical History  Procedure Laterality Date  . Cataract extraction  6 16 2006  . Cryotherapy  2 15 2006    facial AK  . Total abdominal hysterectomy w/ bilateral salpingoophorectomy      due to endometriosis  non malignant  . Hyperplastic   4 20 2006    AK shave biopsy  . Abdominal hysterectomy      due to fibroids    VITAL SIGNS BP 126/70 mmHg  Pulse 76  Ht 4\' 10"  (1.473 m)  Wt 180 lb (81.647 kg)  BMI 37.63 kg/m2  SpO2 98%   Outpatient Encounter Prescriptions as of 06/16/2014  Medication Sig  . aspirin EC 81 MG tablet Take 81 mg by mouth every morning.   Marland Kitchen atorvastatin (LIPITOR) 20 MG tablet Take 1 tablet (20 mg total) by mouth at bedtime.  . Calcium Carbonate-Vitamin D (CALCIUM 600-D) 600-400 MG-UNIT per tablet Take 1 tablet by mouth every morning.   . docusate sodium (COLACE) 100 MG capsule Take 100 mg by mouth 2 (two) times daily.  Marland Kitchen donepezil (ARICEPT) 5 MG tablet Take 10 mg by mouth at bedtime.   Marland Kitchen escitalopram (LEXAPRO) 10 MG  tablet Take 40 mg by mouth at bedtime.   . fluticasone (FLONASE) 50 MCG/ACT nasal spray Place 2 sprays into the nose daily as needed.   . insulin glargine (LANTUS) 100 UNIT/ML injection Inject 10 Units into the skin at bedtime.  Marland Kitchen ketotifen (ZADITOR) 0.025 % ophthalmic solution Place 1 drop into both eyes 2 (two) times daily.  . Memantine HCl ER (NAMENDA XR) 28 MG CP24 Take 28 mg by mouth daily.  . metFORMIN (GLUCOPHAGE) 500 MG tablet Take 2 tablets (1,000 mg total) by mouth 2 (two) times daily with a meal.  . Multiple Vitamin (MULTIVITAMIN WITH MINERALS) TABS Take 1 tablet by mouth every morning.  . nortriptyline (PAMELOR) 75 MG capsule Take 75 mg by mouth at bedtime.  Marland Kitchen OLANZapine (ZYPREXA) 5 MG tablet Take 15 mg by mouth at bedtime.  Marland Kitchen omeprazole (PRILOSEC) 20 MG capsule Take 1 capsule (20 mg total) by mouth 2 (two) times daily.  . primidone (MYSOLINE) 50 MG tablet Take 25 mg by mouth at bedtime.     SIGNIFICANT DIAGNOSTIC EXAMS   03-07-14: chest x-ray: right lower lobe mass (2.5 cm)   03-19-14: ct of chest: No parenchymal  mass is noted within the lungs. No acute abnormality noted.   LABS REVIEWED:  10-21-13: chol 155; ldl 82; trig 133 01-29-14: glucose 123; bun 12.6; creat 0.79; k+4.2; na++ 143; hgb a1c 6.8;  Urine micro-albumin: 1.2 02-19-14: tsh 2.66  03-10-14: wbc 8.1; hgb 11.7; hct 39.8; mcv 86.6; plt 243; glucose 176; bun 12.6; creat 0.74; k+4.3; na++143  05-21-14: glucose 226; bun 13.7; creat 0.76; k+4.4; na++140; liver normal albumin 3.8; hgb a1c 7.0 chol 158; ldl 80; trig 169; hdl 45;       ROS Constitutional: Negative for malaise/fatigue.  Respiratory: negative for cough or shortness of breath  Cardiovascular: Negative for chest pain, palpitations and leg swelling.  Gastrointestinal: Negative for heartburn, abdominal pain and constipation.  Musculoskeletal: Negative for myalgias and joint pain.  Skin: Negative.   Psychiatric/Behavioral: The patient is not  nervous/anxious   Physical Exam Constitutional: No distress.  Over weight   Neck: Neck supple. No JVD present.  Cardiovascular: Normal rate, regular rhythm and intact distal pulses.   Respiratory: Effort normal. Lungs clear  No respiratory distress. Marland Kitchen  GI: Soft. Bowel sounds are normal. She exhibits no distension. There is no tenderness.  Musculoskeletal: She exhibits no edema.  Is able to move all extremities   Neurological: She is alert.  Skin: Skin is warm and dry. She is not diaphoretic     ASSESSMENT/ PLAN:   1. Dementia without behavior disturbance; is without change will continue aricept 10 mg daily and namenda xr 28 mg daily will not make changes will monitor   2. Tremor is stable will continue primidone 25 mg nightly   3. Dyslipidemia: will continue lipitor 20 mg daily ldl is 80  4. Diabetes is stable is taking  lantus 10 units nightly; metformin 1 gm twice daily her hgb a1c is 7.0.  and will monitor her status.   5. [name]: will continue prilosec 20 mg twice daily   6. Constipation: will continue colace twice daily   7. Depression: will continue lexapro 10 mg nightly and pamelor 75 mg nightly and will monitor   8. Schizophrenia: is stable will continue zyprexa 15 mg nightly and will monitor   9. Macular degeneration: will contine maxitrol oint to right eye four times daily; zaditor to both eyes twice daily    Ok Edwards NP [facility]  Contact [phone] Monday through Friday 8am- 5pm  After hours call [phone]

## 2014-07-26 DIAGNOSIS — F0391 Unspecified dementia with behavioral disturbance: Secondary | ICD-10-CM | POA: Diagnosis not present

## 2014-07-26 DIAGNOSIS — F259 Schizoaffective disorder, unspecified: Secondary | ICD-10-CM | POA: Diagnosis not present

## 2014-07-26 DIAGNOSIS — F329 Major depressive disorder, single episode, unspecified: Secondary | ICD-10-CM | POA: Diagnosis not present

## 2014-08-03 DIAGNOSIS — Z029 Encounter for administrative examinations, unspecified: Secondary | ICD-10-CM | POA: Diagnosis not present

## 2014-08-10 ENCOUNTER — Encounter: Payer: Self-pay | Admitting: *Deleted

## 2014-08-10 ENCOUNTER — Non-Acute Institutional Stay (SKILLED_NURSING_FACILITY): Payer: Medicare Other | Admitting: Adult Health

## 2014-08-10 DIAGNOSIS — F329 Major depressive disorder, single episode, unspecified: Secondary | ICD-10-CM | POA: Diagnosis not present

## 2014-08-10 DIAGNOSIS — F209 Schizophrenia, unspecified: Secondary | ICD-10-CM | POA: Diagnosis not present

## 2014-08-10 DIAGNOSIS — E785 Hyperlipidemia, unspecified: Secondary | ICD-10-CM | POA: Diagnosis not present

## 2014-08-10 DIAGNOSIS — E119 Type 2 diabetes mellitus without complications: Secondary | ICD-10-CM

## 2014-08-10 DIAGNOSIS — G250 Essential tremor: Secondary | ICD-10-CM

## 2014-08-10 DIAGNOSIS — F32A Depression, unspecified: Secondary | ICD-10-CM

## 2014-08-10 DIAGNOSIS — F0390 Unspecified dementia without behavioral disturbance: Secondary | ICD-10-CM

## 2014-08-10 DIAGNOSIS — K5900 Constipation, unspecified: Secondary | ICD-10-CM

## 2014-08-10 DIAGNOSIS — E1169 Type 2 diabetes mellitus with other specified complication: Secondary | ICD-10-CM | POA: Diagnosis not present

## 2014-08-10 DIAGNOSIS — K5909 Other constipation: Secondary | ICD-10-CM

## 2014-08-10 DIAGNOSIS — K219 Gastro-esophageal reflux disease without esophagitis: Secondary | ICD-10-CM | POA: Diagnosis not present

## 2014-08-14 ENCOUNTER — Emergency Department (HOSPITAL_COMMUNITY)
Admission: EM | Admit: 2014-08-14 | Discharge: 2014-08-15 | Disposition: A | Discharge status: Home / Self Care | Payer: Medicare Other | Attending: Emergency Medicine | Admitting: Emergency Medicine

## 2014-08-14 ENCOUNTER — Encounter (HOSPITAL_COMMUNITY): Payer: Self-pay | Admitting: *Deleted

## 2014-08-14 ENCOUNTER — Emergency Department (HOSPITAL_COMMUNITY): Payer: Medicare Other

## 2014-08-14 DIAGNOSIS — E119 Type 2 diabetes mellitus without complications: Secondary | ICD-10-CM | POA: Diagnosis not present

## 2014-08-14 DIAGNOSIS — L89309 Pressure ulcer of unspecified buttock, unspecified stage: Secondary | ICD-10-CM | POA: Diagnosis not present

## 2014-08-14 DIAGNOSIS — L8990 Pressure ulcer of unspecified site, unspecified stage: Secondary | ICD-10-CM

## 2014-08-14 DIAGNOSIS — Z7951 Long term (current) use of inhaled steroids: Secondary | ICD-10-CM | POA: Insufficient documentation

## 2014-08-14 DIAGNOSIS — R531 Weakness: Secondary | ICD-10-CM | POA: Diagnosis not present

## 2014-08-14 DIAGNOSIS — L98411 Non-pressure chronic ulcer of buttock limited to breakdown of skin: Secondary | ICD-10-CM | POA: Diagnosis not present

## 2014-08-14 DIAGNOSIS — M6281 Muscle weakness (generalized): Secondary | ICD-10-CM | POA: Diagnosis not present

## 2014-08-14 DIAGNOSIS — E669 Obesity, unspecified: Secondary | ICD-10-CM | POA: Diagnosis not present

## 2014-08-14 DIAGNOSIS — Z87891 Personal history of nicotine dependence: Secondary | ICD-10-CM | POA: Insufficient documentation

## 2014-08-14 DIAGNOSIS — Z7982 Long term (current) use of aspirin: Secondary | ICD-10-CM | POA: Insufficient documentation

## 2014-08-14 DIAGNOSIS — Z8742 Personal history of other diseases of the female genital tract: Secondary | ICD-10-CM | POA: Insufficient documentation

## 2014-08-14 DIAGNOSIS — I10 Essential (primary) hypertension: Secondary | ICD-10-CM | POA: Diagnosis not present

## 2014-08-14 DIAGNOSIS — Z794 Long term (current) use of insulin: Secondary | ICD-10-CM | POA: Insufficient documentation

## 2014-08-14 DIAGNOSIS — R51 Headache: Secondary | ICD-10-CM | POA: Insufficient documentation

## 2014-08-14 DIAGNOSIS — H269 Unspecified cataract: Secondary | ICD-10-CM | POA: Insufficient documentation

## 2014-08-14 DIAGNOSIS — F329 Major depressive disorder, single episode, unspecified: Secondary | ICD-10-CM | POA: Insufficient documentation

## 2014-08-14 DIAGNOSIS — Z862 Personal history of diseases of the blood and blood-forming organs and certain disorders involving the immune mechanism: Secondary | ICD-10-CM | POA: Insufficient documentation

## 2014-08-14 DIAGNOSIS — Z79899 Other long term (current) drug therapy: Secondary | ICD-10-CM | POA: Insufficient documentation

## 2014-08-14 DIAGNOSIS — R350 Frequency of micturition: Secondary | ICD-10-CM | POA: Insufficient documentation

## 2014-08-14 DIAGNOSIS — Z8619 Personal history of other infectious and parasitic diseases: Secondary | ICD-10-CM | POA: Insufficient documentation

## 2014-08-14 DIAGNOSIS — F200 Paranoid schizophrenia: Secondary | ICD-10-CM | POA: Insufficient documentation

## 2014-08-14 DIAGNOSIS — E785 Hyperlipidemia, unspecified: Secondary | ICD-10-CM | POA: Insufficient documentation

## 2014-08-14 LAB — URINALYSIS, ROUTINE W REFLEX MICROSCOPIC
Glucose, UA: NEGATIVE mg/dL
Hgb urine dipstick: NEGATIVE
Ketones, ur: NEGATIVE mg/dL
Leukocytes, UA: NEGATIVE
NITRITE: NEGATIVE
PROTEIN: NEGATIVE mg/dL
Specific Gravity, Urine: 1.026 (ref 1.005–1.030)
UROBILINOGEN UA: 1 mg/dL (ref 0.0–1.0)
pH: 5.5 (ref 5.0–8.0)

## 2014-08-14 LAB — CBC WITH DIFFERENTIAL/PLATELET
BASOS PCT: 0 % (ref 0–1)
Basophils Absolute: 0 10*3/uL (ref 0.0–0.1)
Eosinophils Absolute: 0.1 10*3/uL (ref 0.0–0.7)
Eosinophils Relative: 1 % (ref 0–5)
HEMATOCRIT: 39.5 % (ref 36.0–46.0)
HEMOGLOBIN: 12 g/dL (ref 12.0–15.0)
LYMPHS ABS: 2.8 10*3/uL (ref 0.7–4.0)
LYMPHS PCT: 27 % (ref 12–46)
MCH: 26.3 pg (ref 26.0–34.0)
MCHC: 30.4 g/dL (ref 30.0–36.0)
MCV: 86.6 fL (ref 78.0–100.0)
MONO ABS: 0.6 10*3/uL (ref 0.1–1.0)
Monocytes Relative: 6 % (ref 3–12)
NEUTROS ABS: 6.7 10*3/uL (ref 1.7–7.7)
Neutrophils Relative %: 66 % (ref 43–77)
Platelets: 268 10*3/uL (ref 150–400)
RBC: 4.56 MIL/uL (ref 3.87–5.11)
RDW: 13.9 % (ref 11.5–15.5)
WBC: 10.3 10*3/uL (ref 4.0–10.5)

## 2014-08-14 LAB — COMPREHENSIVE METABOLIC PANEL
ALT: 19 U/L (ref 14–54)
AST: 24 U/L (ref 15–41)
Albumin: 3.6 g/dL (ref 3.5–5.0)
Alkaline Phosphatase: 85 U/L (ref 38–126)
Anion gap: 6 (ref 5–15)
BILIRUBIN TOTAL: 0.5 mg/dL (ref 0.3–1.2)
BUN: 18 mg/dL (ref 6–20)
CHLORIDE: 102 mmol/L (ref 101–111)
CO2: 31 mmol/L (ref 22–32)
Calcium: 9.1 mg/dL (ref 8.9–10.3)
Creatinine, Ser: 0.76 mg/dL (ref 0.44–1.00)
GFR calc Af Amer: >60 mL/min (ref >60)
GFR calc non Af Amer: >60 mL/min (ref >60)
Glucose, Bld: 125 mg/dL — ABNORMAL HIGH (ref 70–99)
Potassium: 4.6 mmol/L (ref 3.5–5.1)
Sodium: 139 mmol/L (ref 135–145)
Total Protein: 7.2 g/dL (ref 6.5–8.1)

## 2014-08-14 NOTE — ED Notes (Signed)
Pt arrived via EMS from [facility] home,  She has generalized weakness, headache and urinary frequency with odor for two days,  Pt is poor historian she has dementia at one point she will say her head hurts then says it doesn't,  Pt is alert and oriented per norm at [facility] [address]

## 2014-08-14 NOTE — ED Provider Notes (Signed)
CSN: [id number]     Arrival date & time 08/14/14  2019 History   First MD Initiated Contact with Patient 08/14/14 2030     Chief Complaint  Patient presents with  . Weakness  . Headache  . Urinary Frequency     Patient is a 81 y.o. female presenting with weakness, headaches, and frequency. The history is provided by the patient, a relative and the nursing home. No language interpreter was used.  Weakness Associated symptoms include headaches.  Headache Associated symptoms: weakness   Urinary Frequency Associated symptoms include headaches.   [name] presents for evaluation of headache.  Level V caveat due to dementia.  She states she had a headache earlier but not now.  At [facility] she was noted to have an elevated BP and low oxygen prior to arrival.  She has a chronic cough. No reports of recent head injury, fever, chest pain, vomiting, abdominal pain.  She does have some pain on her buttocks and hx/o wound in that area.    Per nursing home report patient had a blood pressure of 347 systolic and her oxygen level was around 90. Daughter is concerned due to skin breakdown on her backside.  Past Medical History  Diagnosis Date  . Phobia   . Macular degeneration   . Narcissism   . Schizophrenia, paranoid   . Fungal dermatitis   . Hyperlipidemia   . Mild cognitive impairment   . Diabetes mellitus type 2 in obese   . Stress incontinence, female   . Allergic rhinitis   . Normocytic anemia   . Depressive disorder   . Hypertension   . CATARACT 06/06/2006    Qualifier: Diagnosis of  By: Genene Churn MD, Janett Billow     Past Surgical History  Procedure Laterality Date  . Cataract extraction  6 16 2006  . Cryotherapy  2 15 2006    facial AK  . Total abdominal hysterectomy w/ bilateral salpingoophorectomy      due to endometriosis  non malignant  . Hyperplastic   4 20 2006    AK shave biopsy  . Abdominal hysterectomy      due to fibroids   Family History  Problem Relation Age  of Onset  . Heart disease Mother   . Stroke Mother   . Heart disease Father   . Stroke Father   . Cancer Cousin     liver and colon   History  Substance Use Topics  . Smoking status: Former Research scientist (life sciences)  . Smokeless tobacco: Not on file  . Alcohol Use: No   OB History    No data available     Review of Systems  Genitourinary: Positive for frequency.  Neurological: Positive for weakness and headaches.  All other systems reviewed and are negative.     Allergies  Ether  Home Medications   Prior to Admission medications   Medication Sig Start Date End Date Taking? Authorizing Provider  aspirin EC 81 MG tablet Take 81 mg by mouth every morning. For heart disease   Yes Historical Provider, MD  Calcium Carbonate-Vitamin D (CALCIUM 600-D) 600-400 MG-UNIT per tablet Take 1 tablet by mouth every morning.    Yes Historical Provider, MD  cetirizine (ZYRTEC) 10 MG tablet Take 10 mg by mouth daily.   Yes Historical Provider, MD  donepezil (ARICEPT) 10 MG tablet Take 10 mg by mouth at bedtime.   Yes Historical Provider, MD  atorvastatin (LIPITOR) 20 MG tablet Take 1 tablet (20 mg total)  by mouth at bedtime. 08/24/11   Clovis Cao, MD  docusate sodium (COLACE) 100 MG capsule Take 100 mg by mouth 2 (two) times daily. For constipation    Historical Provider, MD  escitalopram (LEXAPRO) 10 MG tablet Take 30 mg by mouth at bedtime. For depression 07/26/14   Clovis Cao, MD  fluticasone (FLONASE) 50 MCG/ACT nasal spray Place 2 sprays into the nose daily as needed.     Historical Provider, MD  insulin glargine (LANTUS) 100 UNIT/ML injection Inject 10 Units into the skin at bedtime. For diabetes    Historical Provider, MD  ketotifen (ZADITOR) 0.025 % ophthalmic solution Place 1 drop into both eyes 2 (two) times daily.    Historical Provider, MD  Memantine HCl ER (NAMENDA XR) 28 MG CP24 Take 28 mg by mouth daily. For dementia    Historical Provider, MD  metFORMIN (GLUCOPHAGE) 500 MG tablet Take 2  tablets (1,000 mg total) by mouth 2 (two) times daily with a meal. 01/27/14   Tiffany L Reed, DO  Multiple Vitamin (MULTIVITAMIN WITH MINERALS) TABS Take 1 tablet by mouth every morning.    Historical Provider, MD  nortriptyline (PAMELOR) 75 MG capsule Take 75 mg by mouth at bedtime. Depressive disorder    Historical Provider, MD  OLANZapine (ZYPREXA) 5 MG tablet Take 15 mg by mouth at bedtime. 06/25/12   Clovis Cao, MD  omeprazole (PRILOSEC) 20 MG capsule Take 1 capsule (20 mg total) by mouth 2 (two) times daily. Patient taking differently: Take 20 mg by mouth 2 (two) times daily. For reflux 02/05/12   Clovis Cao, MD  primidone (MYSOLINE) 50 MG tablet Take 50 mg by mouth at bedtime. For tremors    Historical Provider, MD   BP 178/71 mmHg  Pulse 80  Temp(Src) 98.7 F (37.1 C) (Oral)  Resp 18  SpO2 95% Physical Exam  Constitutional: She appears well-developed and well-nourished.  HENT:  Head: Normocephalic and atraumatic.  Eyes: Pupils are equal, round, and reactive to light.  Cardiovascular: Normal rate and regular rhythm.   No murmur heard. Pulmonary/Chest: Effort normal and breath sounds normal. No respiratory distress.  Abdominal: Soft. There is no tenderness. There is no rebound and no guarding.  Genitourinary:  Erythema to sacrum/buttocks, 1-2 cm area of skin breakdown and maceration  Musculoskeletal: She exhibits no edema or tenderness.  Neurological: She is alert.  Disoriented to place and time, generalized weakness. No facial asymmetry  Skin: Skin is warm and dry.  Psychiatric:  Flat affect  Nursing note and vitals reviewed.   ED Course  Procedures (including critical care time) Labs Review Labs Reviewed  URINALYSIS, ROUTINE W REFLEX MICROSCOPIC - Abnormal; Notable for the following:    Color, Urine AMBER (*)    Bilirubin Urine SMALL (*)    All other components within normal limits  COMPREHENSIVE METABOLIC PANEL - Abnormal; Notable for the following:     Glucose, Bld 125 (*)    All other components within normal limits  URINE CULTURE  CBC WITH DIFFERENTIAL/PLATELET    Imaging Review Dg Chest 2 View  08/14/2014   CLINICAL DATA:  Generalized weakness, headache, and urinary frequency for 2 days.  EXAM: CHEST  2 VIEW  COMPARISON:  Chest CT 03/19/2014 and radiograph 07/13/2012  FINDINGS: The cardiac silhouette is upper limits of normal to mildly enlarged in size. Thoracic aorta is mildly tortuous. The lungs are clear. No pleural effusion or pneumothorax is identified. No acute osseous abnormality is seen.  IMPRESSION:  No active cardiopulmonary disease.   Electronically Signed   By: Logan Bores   On: 08/14/2014 23:25   Ct Head Wo Contrast  08/14/2014   CLINICAL DATA:  Weakness, headache, in urinary frequency.  EXAM: CT HEAD WITHOUT CONTRAST  TECHNIQUE: Contiguous axial images were obtained from the base of the skull through the vertex without intravenous contrast.  COMPARISON:  07/13/2012  FINDINGS: Slightly asymmetric CSF overlying the left frontal lobe is unchanged and may represent mildly asymmetric atrophy. There is no evidence of acute cortical infarct, intracranial hemorrhage, mass, or midline shift. There is mild global cerebral atrophy. Periventricular white-matter hypodensities are unchanged consistent with mild chronic small vessel ischemic disease.  Prior bilateral cataract extraction is noted. Small right mastoid effusion is noted. Paranasal sinuses are clear.  IMPRESSION: 1. No evidence of acute intracranial abnormality. 2. Chronic small vessel ischemic disease and cerebral atrophy.   Electronically Signed   By: Logan Bores   On: 08/14/2014 23:28     EKG Interpretation None      MDM   Final diagnoses:  Decubitus skin ulcer  Essential hypertension    Patient here from nursing facility for evaluation of high blood pressure and intermittent headache, now resolved. Patient is hypertensive but no evidence of hypertensive urgency. The  history and presentation is not consistent with subarachnoid hemorrhage, meningitis, CVA. Her daughter patient is at her baseline in the emergency department. There is skin breakdown on her sacrum this does not appear to be infected currently. Recommend dressing care to the area and making sure the patient is not sitting in wet undergarments. Discussed PCP follow-up and return precautions.    Quintella Reichert, MD 08/15/14 Benancio Deeds

## 2014-08-14 NOTE — ED Notes (Signed)
Bed: YO43 Expected date:  Expected time:  Means of arrival:  Comments: EMS 81yo F, gen weakness

## 2014-08-14 NOTE — ED Notes (Signed)
Pt is in xray

## 2014-08-14 NOTE — ED Notes (Signed)
Pt was drenched in dark strong smelling urine when she arrived,  She also stated her bottom hurt,  Patch on bottom removed due to being wet with urine,  Pt cleaned and dry brief placed on pt for comfort.,  Cream applied to pt's bottom for moisture relief and soothing of soreness

## 2014-08-15 LAB — URINE CULTURE
COLONY COUNT: NO GROWTH
CULTURE: NO GROWTH

## 2014-08-15 NOTE — ED Notes (Signed)
Pt's daughter hesitated to return pt to   Facility bc she stated her pressure was high but the pt's pressure has been consistently same since arrival and has a history of hypertension

## 2014-08-15 NOTE — ED Notes (Signed)
[facility] at bedside to pick up pt.  Daughter is also at bedside awaiting for her Mother to be transported back to facility,  Daughter at one time had wanted to take pt but states unable to lift pt and I [facility] not get [facility] at [facility] to make [facility] aware of [facility],  Pt's daughter stated she would hold [place] liable if she left with her mother and an accident happened or she couldn't get her into facility.  Pt's daughter aware that she is liable for any injury, Tiffany RN charge aware and speaking with family member

## 2014-08-15 NOTE — ED Notes (Signed)
[facility] called to transport pt back to [facility]

## 2014-08-15 NOTE — ED Notes (Signed)
Pt had urinated,  She was cleaned and dressed to go back to [facility]

## 2014-08-15 NOTE — Discharge Instructions (Signed)
Please continue your current medications as directed.  Ms. Scogin has skin breakdown on her backside and will need frequent changing if she urinates - please check her regularly.  Apply a dressing to the area with skin break down.     Pressure Ulcer A pressure ulcer is a sore that has formed from the breakdown of skin and exposure of deeper layers of tissue. It develops in areas of the body where there is unrelieved pressure. Pressure ulcers are usually found over a bony area, such as the shoulder blades, spine, lower back, hips, knees, ankles, and heels. Pressure ulcers vary in severity. Your health care provider may determine the severity (stage) of your pressure ulcer. The stages include:  Stage I--The skin is red, and when the skin is pressed, it stays red.  Stage II--The top layer of skin is gone, and there is a shallow, pink ulcer.  Stage III--The ulcer becomes deeper, and it is more difficult to see the whole wound. Also, there may be yellow or brown parts, as well as pink and red parts.  Stage IV--The ulcer may be deep and red, pink, brown, white, or yellow. Bone or muscle may be seen.  Unstageable pressure ulcer--The ulcer is covered almost completely with black, brown, or yellow tissue. It is not known how deep the ulcer is or what stage it is until this covering comes off.  Suspected deep tissue injury--A person's skin can be injured from pressure or pulling on the skin when his or her position is changed. The skin appears purple or maroon. There may not be an opening in the skin, but there could be a blood-filled blister. This deep tissue injury is often difficult to see in people with darker skin tones. The site may open and become deeper in time. However, early interventions will help the area heal and may prevent the area from opening. CAUSES  Pressure ulcers are caused by pressure against the skin that limits the flow of blood to the skin and nearby tissues. There are many risk  factors that can lead to pressure sores. RISK FACTORS  Decreased ability to move.  Decreased ability to feel pain or discomfort.  Excessive skin moisture from urine, stool, sweat, or secretions.  Poor nutrition.  Dehydration.  Tobacco, drug, or alcohol abuse.  Having someone pull on bedsheets that are under you, such as when health care workers are changing your position in a hospital bed.  Obesity.  Increased adult age.  Hospitalization in a critical care unit for longer than 4 days with use of medical devices.  Prolonged use of medical devices.  Critical illness.  Anemia.  Traumatic brain injury.  Spinal cord injury.  Stroke.  Diabetes.  Poor blood glucose control.  Low blood pressure (hypotension).  Low oxygen levels.  Medicines that reduce blood flow.  Infection. DIAGNOSIS  Your health care provider will diagnose your pressure ulcer based on its appearance. The health care provider may determine the stage of your pressure ulcer as well. Tests may be done to check for infection, to assess your circulation, or to check for other diseases, such as diabetes. TREATMENT  Treatment of your pressure ulcer begins with determining what stage the ulcer is in. Your treatment team may include your health care provider, a wound care specialist, a nutritionist, a physical therapist, and a Psychologist, sport and exercise. Possible treatments may include:   Moving or repositioning every 1-2 hours.  Using beds or mattresses to shift your body weight and pressure points frequently.  Improving your diet.  Cleaning and bandaging (dressing) the open wound.  Giving antibiotic medicines.  Removing damaged tissue.  Surgery and sometimes skin grafts. HOME CARE INSTRUCTIONS  If you were hospitalized, follow the care plan that was started in the hospital.  Avoid staying in the same position for more than 2 hours. Use padding, devices, or mattresses to cushion your pressure points as directed by  your health care provider.  Eat a well-balanced diet. Take nutritional supplements and vitamins as directed by your health care provider.  Keep all follow-up appointments.  Only take over-the-counter or prescription medicines for pain, fever, or discomfort as directed by your health care provider. SEEK MEDICAL CARE IF:   Your pressure ulcer is not improving.  You do not know how to care for your pressure ulcer.  You notice other areas of redness on your skin.  You have a fever. SEEK IMMEDIATE MEDICAL CARE IF:   You have increasing redness, swelling, or pain in your pressure ulcer.  You notice pus coming from your pressure ulcer.  You notice a bad smell coming from the wound or dressing.  Your pressure ulcer opens up again. Document Released: 03/26/2005 Document Revised: 03/31/2013 Document Reviewed: 12/01/2012 [facility] Patient Information 2015 [place], [place]. This information is not intended to replace advice given to you by your health care provider. Make sure you discuss any questions you have with your health care provider.

## 2014-08-17 DIAGNOSIS — F259 Schizoaffective disorder, unspecified: Secondary | ICD-10-CM | POA: Diagnosis not present

## 2014-08-17 DIAGNOSIS — F0391 Unspecified dementia with behavioral disturbance: Secondary | ICD-10-CM | POA: Diagnosis not present

## 2014-08-17 DIAGNOSIS — F329 Major depressive disorder, single episode, unspecified: Secondary | ICD-10-CM | POA: Diagnosis not present

## 2014-08-18 ENCOUNTER — Ambulatory Visit (INDEPENDENT_AMBULATORY_CARE_PROVIDER_SITE_OTHER): Payer: Medicare Other | Admitting: Ophthalmology

## 2014-08-18 DIAGNOSIS — H35033 Hypertensive retinopathy, bilateral: Secondary | ICD-10-CM

## 2014-08-18 DIAGNOSIS — H3532 Exudative age-related macular degeneration: Secondary | ICD-10-CM

## 2014-08-18 DIAGNOSIS — I10 Essential (primary) hypertension: Secondary | ICD-10-CM | POA: Diagnosis not present

## 2014-08-18 DIAGNOSIS — H3531 Nonexudative age-related macular degeneration: Secondary | ICD-10-CM | POA: Diagnosis not present

## 2014-08-18 DIAGNOSIS — H43813 Vitreous degeneration, bilateral: Secondary | ICD-10-CM | POA: Diagnosis not present

## 2014-08-19 DIAGNOSIS — M8580 Other specified disorders of bone density and structure, unspecified site: Secondary | ICD-10-CM | POA: Diagnosis not present

## 2014-08-19 DIAGNOSIS — Z1231 Encounter for screening mammogram for malignant neoplasm of breast: Secondary | ICD-10-CM | POA: Diagnosis not present

## 2014-08-19 DIAGNOSIS — Z8262 Family history of osteoporosis: Secondary | ICD-10-CM | POA: Diagnosis not present

## 2014-08-19 LAB — HM DEXA SCAN

## 2014-08-19 LAB — HM MAMMOGRAPHY: HM MAMMO: NEGATIVE

## 2014-08-26 ENCOUNTER — Other Ambulatory Visit: Payer: Self-pay | Admitting: *Deleted

## 2014-09-02 ENCOUNTER — Encounter: Payer: Self-pay | Admitting: Internal Medicine

## 2014-09-09 ENCOUNTER — Non-Acute Institutional Stay (SKILLED_NURSING_FACILITY): Payer: Medicare Other | Admitting: Adult Health

## 2014-09-09 DIAGNOSIS — F0390 Unspecified dementia without behavioral disturbance: Secondary | ICD-10-CM | POA: Diagnosis not present

## 2014-09-09 DIAGNOSIS — K219 Gastro-esophageal reflux disease without esophagitis: Secondary | ICD-10-CM | POA: Diagnosis not present

## 2014-09-09 DIAGNOSIS — E785 Hyperlipidemia, unspecified: Secondary | ICD-10-CM

## 2014-09-09 DIAGNOSIS — E1169 Type 2 diabetes mellitus with other specified complication: Secondary | ICD-10-CM

## 2014-09-09 DIAGNOSIS — G2401 Drug induced subacute dyskinesia: Secondary | ICD-10-CM

## 2014-09-09 DIAGNOSIS — G250 Essential tremor: Secondary | ICD-10-CM

## 2014-09-09 DIAGNOSIS — E119 Type 2 diabetes mellitus without complications: Secondary | ICD-10-CM | POA: Diagnosis not present

## 2014-09-09 DIAGNOSIS — F209 Schizophrenia, unspecified: Secondary | ICD-10-CM

## 2014-09-14 ENCOUNTER — Encounter: Payer: Self-pay | Admitting: Adult Health

## 2014-09-14 NOTE — Progress Notes (Signed)
Patient ID: Debra Tran, female   DOB: 04-05-1928, 81 y.o.   MRN: [id number]  [facility]     Allergies  Allergen Reactions  . Ether Nausea And Vomiting       Chief Complaint  Patient presents with  . Annual Exam    HPI:  She is a long term resident of this facility being seen for her annual exam. Overall there is little change in her status over the past several months. She is not voicing any complaints or concerns. There are no nursing concerns at this time.    Past Medical History  Diagnosis Date  . Phobia   . Macular degeneration   . Narcissism   . Schizophrenia, paranoid   . Fungal dermatitis   . Hyperlipidemia   . Mild cognitive impairment   . Diabetes mellitus type 2 in obese   . Stress incontinence, female   . Allergic rhinitis   . Normocytic anemia   . Depressive disorder   . Hypertension   . CATARACT 06/06/2006    Qualifier: Diagnosis of  By: Genene Churn MD, Janett Billow     Past Surgical History  Procedure Laterality Date  . Cataract extraction  6 16 2006  . Cryotherapy  2 15 2006    facial AK  . Total abdominal hysterectomy w/ bilateral salpingoophorectomy      due to endometriosis  non malignant  . Hyperplastic   4 20 2006    AK shave biopsy  . Abdominal hysterectomy      due to fibroids  . Intraocular injection  [id number]     VITAL SIGNS BP 132/72 mmHg  Pulse 66  Ht 4\' 10"  (1.473 m)  Wt 177 lb (80.287 kg)  BMI 37.00 kg/m2  SpO2 97%   Outpatient Encounter Prescriptions as of 08/10/2014  Medication Sig  . aspirin EC 81 MG tablet Take 81 mg by mouth every morning. For heart disease  . atorvastatin (LIPITOR) 20 MG tablet Take 1 tablet (20 mg total) by mouth at bedtime.  . Calcium Carbonate-Vitamin D (CALCIUM 600-D) 600-400 MG-UNIT per tablet Take 1 tablet by mouth every morning.   . docusate sodium (COLACE) 100 MG capsule Take 100 mg by mouth 2 (two) times daily. For constipation  . escitalopram (LEXAPRO) 10 MG tablet Take 30 mg by mouth at  bedtime. For depression  . fluticasone (FLONASE) 50 MCG/ACT nasal spray Place 2 sprays into the nose daily as needed.   . insulin glargine (LANTUS) 100 UNIT/ML injection Inject 10 Units into the skin at bedtime. For diabetes  . ketotifen (ZADITOR) 0.025 % ophthalmic solution Place 1 drop into both eyes 2 (two) times daily.  . Memantine HCl ER (NAMENDA XR) 28 MG CP24 Take 28 mg by mouth daily. For dementia  . metFORMIN (GLUCOPHAGE) 500 MG tablet Take 2 tablets (1,000 mg total) by mouth 2 (two) times daily with a meal.  . Multiple Vitamin (MULTIVITAMIN WITH MINERALS) TABS Take 1 tablet by mouth every morning.  . nortriptyline (PAMELOR) 75 MG capsule Take 75 mg by mouth at bedtime. Depressive disorder  . OLANZapine (ZYPREXA) 5 MG tablet Take 15 mg by mouth at bedtime.  Marland Kitchen omeprazole (PRILOSEC) 20 MG capsule Take 1 capsule (20 mg total) by mouth 2 (two) times daily. (Patient taking differently: Take 20 mg by mouth 2 (two) times daily. For reflux)  . primidone (MYSOLINE) 50 MG tablet Take 50 mg by mouth at bedtime. For tremors      SIGNIFICANT DIAGNOSTIC EXAMS  03-07-14: chest  x-ray: right lower lobe mass (2.5 cm)   03-19-14: ct of chest: No parenchymal mass is noted within the lungs. No acute abnormality noted.   LABS REVIEWED:  10-21-13: chol 155; ldl 82; trig 133 01-29-14: glucose 123; bun 12.6; creat 0.79; k+4.2; na++ 143; hgb a1c 6.8;  Urine micro-albumin: 1.2 02-19-14: tsh 2.66  03-10-14: wbc 8.1; hgb 11.7; hct 39.8; mcv 86.6; plt 243; glucose 176; bun 12.6; creat 0.74; k+4.3; na++143  05-21-14: glucose 226; bun 13.7; creat 0.76; k+4.4; na++140; liver normal albumin 3.8; hgb a1c 7.0 chol 158; ldl 80; trig 169; hdl 45;     Review of Systems  Constitutional: Negative for malaise/fatigue.  Respiratory: Negative for cough and shortness of breath.   Cardiovascular: Negative for chest pain, palpitations and leg swelling.  Gastrointestinal: Negative for heartburn, abdominal pain and  constipation.  Musculoskeletal: Negative for myalgias and joint pain.  Skin: Negative.   Neurological: Negative for headaches.  Psychiatric/Behavioral: The patient is not nervous/anxious.      Physical Exam  Constitutional: No distress.  Overweight   Neck: Neck supple. No JVD present. No thyromegaly present.  Cardiovascular: Normal rate, regular rhythm and intact distal pulses.   Respiratory: Effort normal and breath sounds normal. No respiratory distress.  GI: Soft. Bowel sounds are normal. She exhibits no distension. There is no tenderness.  Genitourinary:  Breast exam normal   Musculoskeletal: She exhibits no edema.  Is able to move all extremities   Neurological: She is alert.  Skin: Skin is warm and dry. She is not diaphoretic.       ASSESSMENT/ PLAN:   1. Dementia without behavior disturbance; is without change will continue aricept 10 mg daily and namenda xr 28 mg daily will not make changes will monitor   2. Tremor is stable will continue primidone 50 mg nightly   3. Dyslipidemia: will continue lipitor 20 mg daily ldl is 80  4. Diabetes is stable is taking  lantus 10 units nightly; metformin 1 gm twice daily her hgb a1c is 7.0.  and will monitor her status.   5. [name]: will continue prilosec 20 mg twice daily   6. Constipation: will continue colace twice daily   7. Depression: will continue lexapro 10 mg nightly and pamelor 25 mg nightly and will monitor   8. Schizophrenia: is stable will continue zyprexa 15 mg nightly and will monitor   Is followed by psych services.   9. Macular degeneration: will contine maxitrol oint to right eye four times daily; zaditor to both eyes twice daily   Will setup a screening mammogram; dexa scan  Will check tsh vit d; cbc; vit b12; folate  She is not appropriate for a colonoscopy     Ok Edwards NP [facility]  Contact [phone] Monday through Friday 8am- 5pm  After hours call [phone]

## 2014-09-15 ENCOUNTER — Encounter: Payer: Self-pay | Admitting: Adult Health

## 2014-09-15 DIAGNOSIS — G2401 Drug induced subacute dyskinesia: Secondary | ICD-10-CM | POA: Insufficient documentation

## 2014-09-15 MED ORDER — DOCUSATE SODIUM 100 MG PO CAPS: 100.0000 mg | ORAL_CAPSULE | Freq: Every day | ORAL | Status: DC

## 2014-09-15 MED ORDER — BENZTROPINE MESYLATE 1 MG PO TABS: 0.5000 mg | ORAL_TABLET | Freq: Two times a day (BID) | ORAL | Status: DC

## 2014-09-15 NOTE — Progress Notes (Signed)
Patient ID: Debra Tran, female   DOB: 08-25-1927, 81 y.o.   MRN: [id number]  [facility]     Allergies  Allergen Reactions  . Ether Nausea And Vomiting       Chief Complaint  Patient presents with  . Medical Management of Chronic Issues    HPI:  She is a long term resident of this facility being seen for the management of her chronic illnesses. Overall she has not had a significant change in her status. Her daughter and I have spoken at great length regarding her medications. She feels as though her mother does not actually have an essential tremor; but rather an effect of long term use of antipsychotics.   Past Medical History  Diagnosis Date  . Phobia   . Macular degeneration   . Narcissism   . Schizophrenia, paranoid   . Fungal dermatitis   . Hyperlipidemia   . Mild cognitive impairment   . Diabetes mellitus type 2 in obese   . Stress incontinence, female   . Allergic rhinitis   . Normocytic anemia   . Depressive disorder   . Hypertension   . CATARACT 06/06/2006    Qualifier: Diagnosis of  By: Genene Churn MD, Janett Billow      Past Surgical History  Procedure Laterality Date  . Cataract extraction  6 16 2006  . Cryotherapy  2 15 2006    facial AK  . Total abdominal hysterectomy w/ bilateral salpingoophorectomy      due to endometriosis  non malignant  . Hyperplastic   4 20 2006    AK shave biopsy  . Abdominal hysterectomy      due to fibroids  . Intraocular injection  [id number]    VITAL SIGNS BP 127/70 mmHg  Pulse 66  Ht 4\' 10"  (1.473 m)  Wt 172 lb (78.019 kg)  BMI 35.96 kg/m2  SpO2 97%   Outpatient Encounter Prescriptions as of 09/09/2014  Medication Sig  . aspirin EC 81 MG tablet Take 81 mg by mouth every morning. For heart disease  . atorvastatin (LIPITOR) 20 MG tablet Take 1 tablet (20 mg total) by mouth at bedtime.  . Calcium Carbonate-Vitamin D (CALCIUM 600-D) 600-400 MG-UNIT per tablet Take 1 tablet by mouth every morning.   . cetirizine (ZYRTEC)  10 MG tablet Take 10 mg by mouth daily.  Marland Kitchen docusate sodium (COLACE) 100 MG capsule Take 100 mg by mouth 2 (two) times daily. For constipation  . donepezil (ARICEPT) 10 MG tablet Take 10 mg by mouth at bedtime.  Marland Kitchen escitalopram (LEXAPRO) 10 MG tablet Take 30 mg by mouth at bedtime. For depression  . glipiZIDE (GLUCOTROL) 10 MG tablet Take 10 mg by mouth daily before breakfast.  . insulin glargine (LANTUS) 100 UNIT/ML injection Inject 10 Units into the skin at bedtime. For diabetes  . ketotifen (ZADITOR) 0.025 % ophthalmic solution Place 1 drop into both eyes 2 (two) times daily.  . Memantine HCl ER (NAMENDA XR) 28 MG CP24 Take 28 mg by mouth daily. For dementia  . Multiple Vitamin (MULTIVITAMIN WITH MINERALS) TABS Take 1 tablet by mouth every morning.  . neomycin-polymyxin b-dexamethasone (MAXITROL) 3.5-10000-0.1 OINT Place 1 application into the right eye 3 (three) times daily.  . nortriptyline (PAMELOR) 75 MG capsule Take 25 mg by mouth at bedtime. Depressive disorder  . OLANZapine (ZYPREXA) 5 MG tablet Take 15 mg by mouth at bedtime.  Marland Kitchen omeprazole (PRILOSEC) 20 MG capsule Take 1 capsule (20 mg total) by mouth 2 (two) times  daily. (Patient taking differently: Take 20 mg by mouth daily. For reflux)  . Polyvinyl Alcohol (LIQUID TEARS OP) Apply 1 drop to eye every 8 (eight) hours as needed. To both eyes  . primidone (MYSOLINE) 50 MG tablet Take 50 mg by mouth at bedtime. For tremors     SIGNIFICANT DIAGNOSTIC EXAMS  03-07-14: chest x-ray: right lower lobe mass (2.5 cm)   03-19-14: ct of chest: No parenchymal mass is noted within the lungs. No acute abnormality noted.   LABS REVIEWED:  10-21-13: chol 155; ldl 82; trig 133 01-29-14: glucose 123; bun 12.6; creat 0.79; k+4.2; na++ 143; hgb a1c 6.8;  Urine micro-albumin: 1.2 02-19-14: tsh 2.66  03-10-14: wbc 8.1; hgb 11.7; hct 39.8; mcv 86.6; plt 243; glucose 176; bun 12.6; creat 0.74; k+4.3; na++143  05-21-14: glucose 226; bun 13.7; creat  0.76; k+4.4; na++140; liver normal albumin 3.8; hgb a1c 7.0 chol 158; ldl 80; trig 169; hdl 45;  08-11-14: wbc 7.1; hgb 11.8; hct 39.4; mcv 85.7; plt 240; tsh 3.23; vit b12: 246; folate 18.1; vit d 24.21      ROS Constitutional: Negative for malaise/fatigue.  Respiratory: Negative for cough and shortness of breath.   Cardiovascular: Negative for chest pain, palpitations and leg swelling.  Gastrointestinal: Negative for heartburn, abdominal pain and constipation.  Musculoskeletal: Negative for myalgias and joint pain.  Skin: Negative.   Neurological: Negative for headaches.  Psychiatric/Behavioral: The patient is not nervous/anxious.      Physical Exam Constitutional: No distress.  Overweight   Neck: Neck supple. No JVD present. No thyromegaly present.  Cardiovascular: Normal rate, regular rhythm and intact distal pulses.   Respiratory: Effort normal and breath sounds normal. No respiratory distress.  GI: Soft. Bowel sounds are normal. She exhibits no distension. There is no tenderness.  Musculoskeletal: She exhibits no edema.  Is able to move all extremities Has abnormal tongue movement when asked to stick out tongue and has lower lip tremor   Neurological: She is alert.  Skin: Skin is warm and dry. She is not diaphoretic.        ASSESSMENT/ PLAN:  1. Dementia without behavior disturbance; is without change will continue aricept 10 mg daily and namenda xr 28 mg daily will not make changes will monitor   2. Tremor: she does not have a resting tremor present she does have abnormal tongue movement and lip tremor. Will lower her primidone to 25 mg nightly for 5 days then stop.   3. Dyslipidemia: will continue lipitor 20 mg daily ldl is 80  4. Diabetes is stable is taking  lantus 10 units nightly; glipizide 10 mg daily  her hgb a1c is 7.0.  and will monitor her status.   5. [name]: will continue prilosec 20 mg  daily   6. Constipation: will change colace to daily   7.  Depression: will continue lexapro 10 mg nightly and pamelor 25 mg nightly and will monitor   8. Schizophrenia: is stable will continue zyprexa 15 mg nightly and will monitor   Is followed by psych services.  Will begin her on cogentin 0.5 mg nightly for tardive dyskinesia and will monitor   9. Macular degeneration: will contine  zaditor to both eyes twice daily   Will begin vit b12 100 mcg daily and vit d 1000 units daily for deficiencies   Will check cmp and hgb a1c      Ok Edwards NP [facility]  Contact [phone] Monday through Friday 8am- 5pm  After hours  call [phone]

## 2014-09-28 ENCOUNTER — Ambulatory Visit (INDEPENDENT_AMBULATORY_CARE_PROVIDER_SITE_OTHER): Payer: Medicare Other | Admitting: Podiatry

## 2014-09-28 ENCOUNTER — Encounter: Payer: Self-pay | Admitting: Podiatry

## 2014-09-28 DIAGNOSIS — E1140 Type 2 diabetes mellitus with diabetic neuropathy, unspecified: Secondary | ICD-10-CM | POA: Diagnosis not present

## 2014-09-28 DIAGNOSIS — M2040 Other hammer toe(s) (acquired), unspecified foot: Secondary | ICD-10-CM

## 2014-09-28 DIAGNOSIS — L603 Nail dystrophy: Secondary | ICD-10-CM

## 2014-09-28 NOTE — Patient Instructions (Signed)
Diabetes and Foot Care Diabetes may cause you to have problems because of poor blood supply (circulation) to your feet and legs. This may cause the skin on your feet to become thinner, break easier, and heal more slowly. Your skin may become dry, and the skin may peel and crack. You may also have nerve damage in your legs and feet causing decreased feeling in them. You may not notice minor injuries to your feet that could lead to infections or more serious problems. Taking care of your feet is one of the most important things you can do for yourself.  HOME CARE INSTRUCTIONS  Wear shoes at all times, even in the house. Do not go barefoot. Bare feet are easily injured.  Check your feet daily for blisters, cuts, and redness. If you cannot see the bottom of your feet, use a mirror or ask someone for help.  Wash your feet with warm water (do not use hot water) and mild soap. Then pat your feet and the areas between your toes until they are completely dry. Do not soak your feet as this can dry your skin.  Apply a moisturizing lotion or petroleum jelly (that does not contain alcohol and is unscented) to the skin on your feet and to dry, brittle toenails. Do not apply lotion between your toes.  Trim your toenails straight across. Do not dig under them or around the cuticle. File the edges of your nails with an emery board or nail file.  Do not cut corns or calluses or try to remove them with medicine.  Wear clean socks or stockings every day. Make sure they are not too tight. Do not wear knee-high stockings since they may decrease blood flow to your legs.  Wear shoes that fit properly and have enough cushioning. To break in new shoes, wear them for just a few hours a day. This prevents you from injuring your feet. Always look in your shoes before you put them on to be sure there are no objects inside.  Do not cross your legs. This may decrease the blood flow to your feet.  If you find a minor scrape,  cut, or break in the skin on your feet, keep it and the skin around it clean and dry. These areas may be cleansed with mild soap and water. Do not cleanse the area with peroxide, alcohol, or iodine.  When you remove an adhesive bandage, be sure not to damage the skin around it.  If you have a wound, look at it several times a day to make sure it is healing.  Do not use heating pads or hot water bottles. They may burn your skin. If you have lost feeling in your feet or legs, you may not know it is happening until it is too late.  Make sure your health care provider performs a complete foot exam at least annually or more often if you have foot problems. Report any cuts, sores, or bruises to your health care provider immediately. SEEK MEDICAL CARE IF:   You have an injury that is not healing.  You have cuts or breaks in the skin.  You have an ingrown nail.  You notice redness on your legs or feet.  You feel burning or tingling in your legs or feet.  You have pain or cramps in your legs and feet.  Your legs or feet are numb.  Your feet always feel cold. SEEK IMMEDIATE MEDICAL CARE IF:   There is increasing redness,   swelling, or pain in or around a wound.  There is a red line that goes up your leg.  Pus is coming from a wound.  You develop a fever or as directed by your health care provider.  You notice a bad smell coming from an ulcer or wound. Document Released: 03/23/2000 Document Revised: 11/26/2012 Document Reviewed: 09/02/2012 ExitCare Patient Information 2015 [employer]. This information is not intended to replace advice given to you by your health care provider. Make sure you discuss any questions you have with your health care provider.  

## 2014-09-28 NOTE — Progress Notes (Signed)
Patient ID: Debra Tran, female   DOB: 1927/05/22, 81 y.o.   MRN: [id number]  Subjective: This patient presents with daughter requesting debridement of painful toenails and for dispensing of diabetic shoes  Objective: Distal right hallux is dry callused area with small amount of bleeding within the callus.  no surrounding erythema, edema, warmth about the bleeding callus. The toenails elongated, brittle, incurvated, discolored 6-10  Diabetic shoes dispensed today do not fit satisfactorily  Assessment: Symptomatic onychomycoses 6-10 Assessment satisfactory fit of diabetic shoes  Plan: Debridement toenails 10 without any bleeding Patient and daughter were advised to apply topical anabiotic ointment and Band-Aid to the distal right hallux daily until the eschar sloughs  Replacement larger diabetic shoes ordered for patient today  Reappoint 3 months or upon receipt of replacement diabetic shoes

## 2014-09-29 ENCOUNTER — Telehealth: Payer: Self-pay | Admitting: *Deleted

## 2014-09-29 NOTE — Telephone Encounter (Addendum)
Requested orders of 09/28/2014.  Copied Dr. Phoebe Perch orders to [facility] form and faxed.

## 2014-10-06 ENCOUNTER — Encounter (INDEPENDENT_AMBULATORY_CARE_PROVIDER_SITE_OTHER): Payer: Medicare Other | Admitting: Ophthalmology

## 2014-10-06 DIAGNOSIS — I10 Essential (primary) hypertension: Secondary | ICD-10-CM | POA: Diagnosis not present

## 2014-10-06 DIAGNOSIS — H3532 Exudative age-related macular degeneration: Secondary | ICD-10-CM | POA: Diagnosis not present

## 2014-10-06 DIAGNOSIS — H3531 Nonexudative age-related macular degeneration: Secondary | ICD-10-CM

## 2014-10-06 DIAGNOSIS — H43813 Vitreous degeneration, bilateral: Secondary | ICD-10-CM | POA: Diagnosis not present

## 2014-10-06 DIAGNOSIS — H35033 Hypertensive retinopathy, bilateral: Secondary | ICD-10-CM

## 2014-10-12 ENCOUNTER — Ambulatory Visit: Payer: Medicare Other

## 2014-10-12 ENCOUNTER — Ambulatory Visit: Payer: Medicare Other | Admitting: Podiatry

## 2014-10-21 ENCOUNTER — Non-Acute Institutional Stay (SKILLED_NURSING_FACILITY): Payer: Medicare Other | Admitting: Adult Health

## 2014-10-21 DIAGNOSIS — K219 Gastro-esophageal reflux disease without esophagitis: Secondary | ICD-10-CM | POA: Diagnosis not present

## 2014-10-21 DIAGNOSIS — K5900 Constipation, unspecified: Secondary | ICD-10-CM | POA: Diagnosis not present

## 2014-10-21 DIAGNOSIS — E1169 Type 2 diabetes mellitus with other specified complication: Secondary | ICD-10-CM | POA: Diagnosis not present

## 2014-10-21 DIAGNOSIS — F0390 Unspecified dementia without behavioral disturbance: Secondary | ICD-10-CM

## 2014-10-21 DIAGNOSIS — E785 Hyperlipidemia, unspecified: Secondary | ICD-10-CM

## 2014-10-21 DIAGNOSIS — G2401 Drug induced subacute dyskinesia: Secondary | ICD-10-CM | POA: Diagnosis not present

## 2014-10-21 DIAGNOSIS — K5909 Other constipation: Secondary | ICD-10-CM

## 2014-10-21 DIAGNOSIS — F209 Schizophrenia, unspecified: Secondary | ICD-10-CM | POA: Diagnosis not present

## 2014-10-21 DIAGNOSIS — I10 Essential (primary) hypertension: Secondary | ICD-10-CM

## 2014-10-27 ENCOUNTER — Ambulatory Visit (INDEPENDENT_AMBULATORY_CARE_PROVIDER_SITE_OTHER): Payer: Medicare Other | Admitting: Podiatry

## 2014-10-27 ENCOUNTER — Encounter: Payer: Self-pay | Admitting: Podiatry

## 2014-10-27 DIAGNOSIS — E1159 Type 2 diabetes mellitus with other circulatory complications: Secondary | ICD-10-CM

## 2014-10-27 DIAGNOSIS — M2040 Other hammer toe(s) (acquired), unspecified foot: Secondary | ICD-10-CM

## 2014-10-27 DIAGNOSIS — M2041 Other hammer toe(s) (acquired), right foot: Secondary | ICD-10-CM

## 2014-10-27 DIAGNOSIS — M2042 Other hammer toe(s) (acquired), left foot: Secondary | ICD-10-CM

## 2014-10-27 DIAGNOSIS — E1140 Type 2 diabetes mellitus with diabetic neuropathy, unspecified: Secondary | ICD-10-CM

## 2014-10-27 NOTE — Patient Instructions (Signed)

## 2014-10-27 NOTE — Progress Notes (Signed)
   Subjective:    Patient ID: Debra Tran, female    DOB: Feb 05, 1928, 81 y.o.   MRN: [id number]  HPI  PUO AND INSTRUCTION     TAN [place] STRAP BROWN SIZE 7 WITH 3 PAIR CUSTOM INSERTS  Patient presents for dispensing of diabetic shoes and custom molded inserts  Review of Systems     Objective:   Physical Exam  Diabetic shoes TAN [place] STRAP BROWN SIZE 7 WITH 3 PAIR CUSTOM INSERTS fit satisfactorily     Assessment & Plan:   Assessment: Satisfactory fit of diabetic shoes 2 with custom multilaminated insoles 6 Indications for shoes are diabetic neuropathy and hammertoes  Plan: Dispensing of diabetic shoes and wearing instruction provided  Keep next scheduled visit for debridement

## 2014-10-28 DIAGNOSIS — F329 Major depressive disorder, single episode, unspecified: Secondary | ICD-10-CM | POA: Diagnosis not present

## 2014-10-28 DIAGNOSIS — F259 Schizoaffective disorder, unspecified: Secondary | ICD-10-CM | POA: Diagnosis not present

## 2014-10-28 DIAGNOSIS — F0391 Unspecified dementia with behavioral disturbance: Secondary | ICD-10-CM | POA: Diagnosis not present

## 2014-11-08 DIAGNOSIS — F329 Major depressive disorder, single episode, unspecified: Secondary | ICD-10-CM | POA: Diagnosis not present

## 2014-11-08 DIAGNOSIS — N393 Stress incontinence (female) (male): Secondary | ICD-10-CM | POA: Diagnosis not present

## 2014-11-08 DIAGNOSIS — H35033 Hypertensive retinopathy, bilateral: Secondary | ICD-10-CM | POA: Diagnosis not present

## 2014-11-08 DIAGNOSIS — F209 Schizophrenia, unspecified: Secondary | ICD-10-CM | POA: Diagnosis not present

## 2014-11-08 DIAGNOSIS — E119 Type 2 diabetes mellitus without complications: Secondary | ICD-10-CM | POA: Diagnosis not present

## 2014-11-08 DIAGNOSIS — H3531 Nonexudative age-related macular degeneration: Secondary | ICD-10-CM | POA: Diagnosis not present

## 2014-11-08 DIAGNOSIS — E1169 Type 2 diabetes mellitus with other specified complication: Secondary | ICD-10-CM | POA: Diagnosis not present

## 2014-11-08 DIAGNOSIS — E118 Type 2 diabetes mellitus with unspecified complications: Secondary | ICD-10-CM | POA: Diagnosis not present

## 2014-11-08 DIAGNOSIS — I2510 Atherosclerotic heart disease of native coronary artery without angina pectoris: Secondary | ICD-10-CM | POA: Diagnosis not present

## 2014-11-08 DIAGNOSIS — L03115 Cellulitis of right lower limb: Secondary | ICD-10-CM | POA: Diagnosis not present

## 2014-11-08 DIAGNOSIS — Z794 Long term (current) use of insulin: Secondary | ICD-10-CM | POA: Diagnosis not present

## 2014-11-08 DIAGNOSIS — B351 Tinea unguium: Secondary | ICD-10-CM | POA: Diagnosis not present

## 2014-11-08 DIAGNOSIS — Z79899 Other long term (current) drug therapy: Secondary | ICD-10-CM | POA: Diagnosis not present

## 2014-11-08 DIAGNOSIS — M79671 Pain in right foot: Secondary | ICD-10-CM | POA: Diagnosis not present

## 2014-11-08 DIAGNOSIS — G2401 Drug induced subacute dyskinesia: Secondary | ICD-10-CM | POA: Diagnosis not present

## 2014-11-08 DIAGNOSIS — H3532 Exudative age-related macular degeneration: Secondary | ICD-10-CM | POA: Diagnosis not present

## 2014-11-08 DIAGNOSIS — D049 Carcinoma in situ of skin, unspecified: Secondary | ICD-10-CM | POA: Diagnosis not present

## 2014-11-08 DIAGNOSIS — I10 Essential (primary) hypertension: Secondary | ICD-10-CM | POA: Diagnosis not present

## 2014-11-08 DIAGNOSIS — F0391 Unspecified dementia with behavioral disturbance: Secondary | ICD-10-CM | POA: Diagnosis not present

## 2014-11-08 DIAGNOSIS — F259 Schizoaffective disorder, unspecified: Secondary | ICD-10-CM | POA: Diagnosis not present

## 2014-11-08 DIAGNOSIS — K5900 Constipation, unspecified: Secondary | ICD-10-CM | POA: Diagnosis not present

## 2014-11-08 DIAGNOSIS — E785 Hyperlipidemia, unspecified: Secondary | ICD-10-CM | POA: Diagnosis not present

## 2014-11-08 DIAGNOSIS — D485 Neoplasm of uncertain behavior of skin: Secondary | ICD-10-CM | POA: Diagnosis not present

## 2014-11-08 DIAGNOSIS — L570 Actinic keratosis: Secondary | ICD-10-CM | POA: Diagnosis not present

## 2014-11-08 DIAGNOSIS — E1140 Type 2 diabetes mellitus with diabetic neuropathy, unspecified: Secondary | ICD-10-CM | POA: Diagnosis not present

## 2014-11-08 DIAGNOSIS — R293 Abnormal posture: Secondary | ICD-10-CM | POA: Diagnosis not present

## 2014-11-08 DIAGNOSIS — M150 Primary generalized (osteo)arthritis: Secondary | ICD-10-CM | POA: Diagnosis not present

## 2014-11-08 DIAGNOSIS — M1991 Primary osteoarthritis, unspecified site: Secondary | ICD-10-CM | POA: Diagnosis not present

## 2014-11-08 DIAGNOSIS — L603 Nail dystrophy: Secondary | ICD-10-CM | POA: Diagnosis not present

## 2014-11-08 DIAGNOSIS — H43813 Vitreous degeneration, bilateral: Secondary | ICD-10-CM | POA: Diagnosis not present

## 2014-11-08 DIAGNOSIS — F0390 Unspecified dementia without behavioral disturbance: Secondary | ICD-10-CM | POA: Diagnosis not present

## 2014-11-08 DIAGNOSIS — H3530 Unspecified macular degeneration: Secondary | ICD-10-CM | POA: Diagnosis not present

## 2014-11-08 DIAGNOSIS — K219 Gastro-esophageal reflux disease without esophagitis: Secondary | ICD-10-CM | POA: Diagnosis not present

## 2014-11-08 DIAGNOSIS — G250 Essential tremor: Secondary | ICD-10-CM | POA: Diagnosis not present

## 2014-11-29 ENCOUNTER — Non-Acute Institutional Stay (SKILLED_NURSING_FACILITY): Payer: Medicare Other | Admitting: Internal Medicine

## 2014-11-29 ENCOUNTER — Encounter: Payer: Self-pay | Admitting: Internal Medicine

## 2014-11-29 DIAGNOSIS — F0390 Unspecified dementia without behavioral disturbance: Secondary | ICD-10-CM

## 2014-11-29 DIAGNOSIS — F209 Schizophrenia, unspecified: Secondary | ICD-10-CM | POA: Diagnosis not present

## 2014-11-29 DIAGNOSIS — E785 Hyperlipidemia, unspecified: Secondary | ICD-10-CM | POA: Diagnosis not present

## 2014-11-29 DIAGNOSIS — G2401 Drug induced subacute dyskinesia: Secondary | ICD-10-CM

## 2014-11-29 DIAGNOSIS — G250 Essential tremor: Secondary | ICD-10-CM | POA: Diagnosis not present

## 2014-11-29 DIAGNOSIS — I10 Essential (primary) hypertension: Secondary | ICD-10-CM

## 2014-11-29 DIAGNOSIS — F329 Major depressive disorder, single episode, unspecified: Secondary | ICD-10-CM | POA: Diagnosis not present

## 2014-11-29 DIAGNOSIS — E1169 Type 2 diabetes mellitus with other specified complication: Secondary | ICD-10-CM

## 2014-11-29 DIAGNOSIS — F32A Depression, unspecified: Secondary | ICD-10-CM

## 2014-11-29 DIAGNOSIS — N393 Stress incontinence (female) (male): Secondary | ICD-10-CM

## 2014-11-29 DIAGNOSIS — E119 Type 2 diabetes mellitus without complications: Secondary | ICD-10-CM | POA: Diagnosis not present

## 2014-11-29 NOTE — Progress Notes (Signed)
Patient ID: Debra Tran, female   DOB: April 15, 1927, 80 y.o.   MRN: [id number]    DATE: 11/29/14  Location:  [facility]    Place of Service: SNF [phone])   Extended Emergency Contact Information Primary Emergency Contact: Allbee,[name] Address: [address]          [place], [place] [address] [name] of [place] Phone: [phone] Relation: Daughter  Advanced Directive information  FULL CODE  Chief Complaint  Patient presents with  . Medical Management of Chronic Issues    HPI:  81 yo female long term resident seen today for f/u. She c/o difficulty falling asleep. No nursing issues. No falls. She is a poor historian due to psych d/o/dementia. Hx obtained from chart  Paranoid schizophrenia/depression/dementia - dementia stable on aricept/namenda XR; mood stable on zyprexa, lexapro, pamelor. She takes cogentin for tardive dyskinesia. she is followed by psych services  DM II - insulin dependent. She takes glipizide. Last A1c 7%. No low BS reactions   Hyperlipidemia- takes lipitor. Last LDL 80  HTN - BP controlled with diet  Stress incontinence - stable. Uses adult briefs  Tremor - stable off primidone  GERD - sx's stable on prilosec   Constipation - stable on daily colace  Macular degeneration - stable on  zaditor   Past Medical History  Diagnosis Date  . Phobia   . Macular degeneration   . Narcissism   . Schizophrenia, paranoid   . Fungal dermatitis   . Hyperlipidemia   . Mild cognitive impairment   . Diabetes mellitus type 2 in obese   . Stress incontinence, female   . Allergic rhinitis   . Normocytic anemia   . Depressive disorder   . Hypertension   . CATARACT 06/06/2006    Qualifier: Diagnosis of  By: Genene Churn MD, Janett Billow      Past Surgical History  Procedure Laterality Date  . Cataract extraction  6 16 2006  . Cryotherapy  2 15 2006    facial AK  . Total abdominal hysterectomy w/ bilateral salpingoophorectomy      due to  endometriosis  non malignant  . Hyperplastic   4 20 2006    AK shave biopsy  . Abdominal hysterectomy      due to fibroids  . Intraocular injection  [id number]    Patient Care Team: Gildardo Cranker, DO as PCP - General (Internal Medicine) Hayden Pedro, MD as Attending Physician (Ophthalmology) Gerlene Fee, NP as Nurse Practitioner (Nurse Practitioner)  Social History   Social History  . Marital Status: Divorced    Spouse Name: N/A  . Number of Children: N/A  . Years of Education: N/A   Occupational History  . Not on file.   Social History Main Topics  . Smoking status: Former Research scientist (life sciences)  . Smokeless tobacco: Not on file  . Alcohol Use: No  . Drug Use: No  . Sexual Activity: Not on file   Other Topics Concern  . Not on file   Social History Narrative   Lives in [facility] (assisted living), remotely smoked and chewed tobacco. Daughter([name])  drives her, independent of all ADL's     reports that she has quit smoking. She does not have any smokeless tobacco history on file. She reports that she does not drink alcohol or use illicit drugs.  Immunization History  Administered Date(s) Administered  . Influenza Split 02/15/2011, 01/21/2012  . Influenza Whole 02/05/2007, 01/17/2010, 01/28/2013  . Pneumococcal Polysaccharide-23 04/10/1999  .  Td 10/07/2001    Allergies  Allergen Reactions  . Ether Nausea And Vomiting    Medications: Patient's Medications  New Prescriptions   No medications on file  Previous Medications   ASPIRIN EC 81 MG TABLET    Take 81 mg by mouth every morning. For heart disease   ATORVASTATIN (LIPITOR) 20 MG TABLET    Take 1 tablet (20 mg total) by mouth at bedtime.   BENZTROPINE (COGENTIN) 1 MG TABLET    Take 0.5 tablets (0.5 mg total) by mouth 2 (two) times daily.   CALCIUM CARBONATE-VITAMIN D (CALCIUM 600-D) 600-400 MG-UNIT PER TABLET    Take 1 tablet by mouth every morning.    CETIRIZINE (ZYRTEC) 10 MG TABLET    Take 10 mg by mouth  daily.   DOCUSATE SODIUM (COLACE) 100 MG CAPSULE    Take 1 capsule (100 mg total) by mouth daily. For constipation   DONEPEZIL (ARICEPT) 10 MG TABLET    Take 10 mg by mouth at bedtime.   ESCITALOPRAM (LEXAPRO) 10 MG TABLET    Take 30 mg by mouth at bedtime. For depression   GLIPIZIDE (GLUCOTROL) 10 MG TABLET    Take 10 mg by mouth daily before breakfast.   INSULIN GLARGINE (LANTUS) 100 UNIT/ML INJECTION    Inject 10 Units into the skin at bedtime. For diabetes   KETOTIFEN (ZADITOR) 0.025 % OPHTHALMIC SOLUTION    Place 1 drop into both eyes 2 (two) times daily.   MEMANTINE HCL ER (NAMENDA XR) 28 MG CP24    Take 28 mg by mouth daily. For dementia   MULTIPLE VITAMIN (MULTIVITAMIN WITH MINERALS) TABS    Take 1 tablet by mouth every morning.   NEOMYCIN-POLYMYXIN B-DEXAMETHASONE (MAXITROL) 3.5-10000-0.1 OINT    Place 1 application into the right eye 3 (three) times daily.   NORTRIPTYLINE (PAMELOR) 75 MG CAPSULE    Take 25 mg by mouth at bedtime. Depressive disorder   OLANZAPINE (ZYPREXA) 5 MG TABLET    Take 15 mg by mouth at bedtime.   OMEPRAZOLE (PRILOSEC) 20 MG CAPSULE    Take 1 capsule (20 mg total) by mouth 2 (two) times daily.   POLYVINYL ALCOHOL (LIQUID TEARS OP)    Apply 1 drop to eye every 8 (eight) hours as needed. To both eyes  Modified Medications   No medications on file  Discontinued Medications   No medications on file    Review of Systems  Unable to perform ROS: Dementia    Filed Vitals:   11/29/14 2114  BP: 100/55  Pulse: 80  Temp: 97.2 F (36.2 C)  Weight: 170 lb (77.111 kg)  SpO2: 96%   Body mass index is 35.54 kg/(m^2).  Physical Exam  Constitutional: She appears well-developed.  Sitting in w/c in NAD, frail appearing  HENT:  Mouth/Throat: Oropharynx is clear and moist. No oropharyngeal exudate.  Eyes: Pupils are equal, round, and reactive to light. No scleral icterus.  Neck: Neck supple. Carotid bruit is not present. No tracheal deviation present. No thyromegaly  present.  Cardiovascular: Normal rate, regular rhythm and intact distal pulses.  Exam reveals no gallop and no friction rub.   Murmur (1/6 SEM) heard. No LE edema b/l. no calf TTP.   Pulmonary/Chest: Effort normal and breath sounds normal. No stridor. No respiratory distress. She has no wheezes. She has no rales.  Abdominal: Soft. Bowel sounds are normal. She exhibits no distension and no mass. There is no hepatomegaly. There is no tenderness. There is no rebound  and no guarding.  Musculoskeletal: She exhibits edema.  Lymphadenopathy:    She has no cervical adenopathy.  Neurological: She is alert. She displays no tremor.  Skin: Skin is warm and dry. No rash noted.  B/l 5th toe calluses but no ulcerations. (+) redness, NT  Psychiatric: She has a normal mood and affect. Her behavior is normal.     Labs reviewed: Nursing Home on 11/29/2014  Component Date Value Ref Range Status  . HM Mammogram 08/19/2014 negative   Final    No results found.   Assessment/Plan   ICD-9-CM ICD-10-CM   1. Schizophrenia, unspecified type 295.90 F20.9   2. Dementia, without behavioral disturbance 294.20 F03.90   3. HYPERTENSION, BENIGN SYSTEMIC 401.1 I10   4. Depressive disorder 311 F32.9   5. Essential tremor 333.1 G25.0   6. Controlled diabetes mellitus type II without complication 485.46 E70.3   7. Stress incontinence 625.6 N39.3   8. Hyperlipidemia associated with type 2 diabetes mellitus 250.80 E11.69    272.4 E78.5   9. Tardive dyskinesia 333.85 G24.01     --check CMP, lipid panel and A1c in Oct 2016  --continue meds as ordered  --cont nutritional supplements as ordered  --f/u with podiatry as scheduled  --PT/OT/ST as indicated  --will follow  Mellody Masri S. Perlie Gold  [facility] [address] [place],  [address] [phone] Cell (Monday-Friday 8 AM - 5 PM) [phone] After 5 PM and follow prompts

## 2014-12-01 DIAGNOSIS — F329 Major depressive disorder, single episode, unspecified: Secondary | ICD-10-CM | POA: Diagnosis not present

## 2014-12-01 DIAGNOSIS — F259 Schizoaffective disorder, unspecified: Secondary | ICD-10-CM | POA: Diagnosis not present

## 2014-12-01 DIAGNOSIS — F0391 Unspecified dementia with behavioral disturbance: Secondary | ICD-10-CM | POA: Diagnosis not present

## 2014-12-08 DIAGNOSIS — L570 Actinic keratosis: Secondary | ICD-10-CM | POA: Diagnosis not present

## 2014-12-08 DIAGNOSIS — D049 Carcinoma in situ of skin, unspecified: Secondary | ICD-10-CM | POA: Diagnosis not present

## 2014-12-08 DIAGNOSIS — D485 Neoplasm of uncertain behavior of skin: Secondary | ICD-10-CM | POA: Diagnosis not present

## 2014-12-14 ENCOUNTER — Encounter: Payer: Self-pay | Admitting: Podiatry

## 2014-12-14 ENCOUNTER — Ambulatory Visit (INDEPENDENT_AMBULATORY_CARE_PROVIDER_SITE_OTHER): Payer: Medicare Other | Admitting: Podiatry

## 2014-12-14 VITALS — BP 142/69 | HR 72 | Temp 97.6°F | Resp 12

## 2014-12-14 DIAGNOSIS — E1140 Type 2 diabetes mellitus with diabetic neuropathy, unspecified: Secondary | ICD-10-CM

## 2014-12-14 DIAGNOSIS — L03115 Cellulitis of right lower limb: Secondary | ICD-10-CM

## 2014-12-14 MED ORDER — CEPHALEXIN 500 MG PO CAPS: 500.0000 mg | ORAL_CAPSULE | Freq: Two times a day (BID) | ORAL | Status: DC

## 2014-12-14 NOTE — Progress Notes (Signed)
   Subjective:    Patient ID: Debra Tran, female    DOB: [birth year], 81 y.o.   MRN: [id number]  HPI   This patient presents with her daughter who states that the last 2 weeks her mother was treated for a wound overlying the medial first MPJ area with gauze dressings and topical antibiotic ointment. She says that she believes these may have developed since she began wearing the replacement diabetic shoes that were dispensed on 10/27/2014   Review of Systems  Musculoskeletal: Positive for joint swelling and gait problem.  Skin: Positive for color change.       Objective:   Physical Exam  Patient seems responsive with her daughter present in the treatment room answering some of the questions for her  Vascular: DP and PT pulses 1/4 bilaterally Capillary reflex immediate bilaterally  Neurological: Decreased sensation to 10 g monofilament wire intact bilaterally Ankle reflexes were equal reactive bilaterally  Dermatological: The medial right first MPJ has crusted area without open wound. There is a low-grade erythema that extends approximately 4 cm around this area. There is no warmth or active drainage or malodor noted. Left medial first MPJ has eschar with several millimeters erythema surrounding the joint. There is no active drainage or open wounds  Musculoskeletal: HAV deformities bilaterally        Assessment & Plan:   Assessment: Decreased pedal pulses suggestive of peripheral arterial disease Decrease in size sensation suggestive peripheral neuropathy  Probable shoe rub over first MPJ bilaterally with cellulitis right first MPJ. Left first MPJ has eschar with minimal inflammation  Plan: Discontinue wearing diabetic shoes and wear either hospital sock or slippers Rx cephalexin 500 mg by mouth twice a day 7 days Apply triple antibiotic ointment to bunion joints first MPJ right and left daily and cover with gauze  Reappoint 7 days

## 2014-12-14 NOTE — Patient Instructions (Signed)
Stop stop wearing diabetic shoes on the right and left feet Wear hospital socks or slippers on the right and left feet Apply triple antibiotic ointment to the bunion joints right and left daily and cover with gauze Begin taking cephalexin 500 mg capsules one twice a day 7 days

## 2014-12-21 ENCOUNTER — Encounter: Payer: Self-pay | Admitting: Adult Health

## 2014-12-21 NOTE — Progress Notes (Signed)
Patient ID: Debra Tran, female   DOB: 1927/05/18, 81 y.o.   MRN: [id number]    Facility: [facility]      Allergies  Allergen Reactions  . Ether Nausea And Vomiting    Chief Complaint  Patient presents with  . Medical Management of Chronic Issues    HPI:  She is a long term resident of this facility being seen for the management of her chronic illnesses. Overall there is little change in her status. She is not voicing any complaints or concerns at this time. There are no nursing concerns at this time.    Past Medical History  Diagnosis Date  . Phobia   . Macular degeneration   . Narcissism   . Schizophrenia, paranoid   . Fungal dermatitis   . Hyperlipidemia   . Mild cognitive impairment   . Diabetes mellitus type 2 in obese   . Stress incontinence, female   . Allergic rhinitis   . Normocytic anemia   . Depressive disorder   . Hypertension   . CATARACT 06/06/2006    Qualifier: Diagnosis of  By: Genene Churn MD, Janett Billow      Past Surgical History  Procedure Laterality Date  . Cataract extraction  6 16 2006  . Cryotherapy  2 15 2006    facial AK  . Total abdominal hysterectomy w/ bilateral salpingoophorectomy      due to endometriosis  non malignant  . Hyperplastic   4 20 2006    AK shave biopsy  . Abdominal hysterectomy      due to fibroids  . Intraocular injection  [id number]    VITAL SIGNS BP 156/73 mmHg  Pulse 81  Ht 4\' 10"  (1.473 m)  Wt 172 lb (78.019 kg)  BMI 35.96 kg/m2  SpO2 98%  Patient's Medications  New Prescriptions   No medications on file  Previous Medications   ASPIRIN EC 81 MG TABLET    Take 81 mg by mouth every morning. For heart disease   ATORVASTATIN (LIPITOR) 20 MG TABLET    Take 1 tablet (20 mg total) by mouth at bedtime.   BENZTROPINE (COGENTIN) 1 MG TABLET Take 0.5 mg nightly    CALCIUM CARBONATE-VITAMIN D (CALCIUM 600-D) 600-400 MG-UNIT PER TABLET    Take 1 tablet by mouth every morning.    CETIRIZINE (ZYRTEC) 10 MG  TABLET    Take 10 mg by mouth daily.   DOCUSATE SODIUM (COLACE) 100 MG CAPSULE    Take 1 capsule (100 mg total) by mouth daily. For constipation   DONEPEZIL (ARICEPT) 10 MG TABLET    Take 10 mg by mouth at bedtime.   ESCITALOPRAM (LEXAPRO) 10 MG TABLET    Take 30 mg by mouth at bedtime. For depression   GLIPIZIDE (GLUCOTROL) 10 MG TABLET    Take 10 mg by mouth daily before breakfast.   INSULIN GLARGINE (LANTUS) 100 UNIT/ML INJECTION    Inject 10 Units into the skin at bedtime. For diabetes   KETOTIFEN (ZADITOR) 0.025 % OPHTHALMIC SOLUTION    Place 1 drop into both eyes 2 (two) times daily.   MEMANTINE HCL ER (NAMENDA XR) 28 MG CP24    Take 28 mg by mouth daily. For dementia   MULTIPLE VITAMIN (MULTIVITAMIN WITH MINERALS) TABS    Take 1 tablet by mouth every morning.   NEOMYCIN-POLYMYXIN B-DEXAMETHASONE (MAXITROL) 3.5-10000-0.1 OINT    Place 1 application into the right eye 3 (three) times daily.   NORTRIPTYLINE (PAMELOR) 75 MG CAPSULE  Take 25 mg by mouth at bedtime. Depressive disorder   OLANZAPINE (ZYPREXA) 5 MG TABLET    Take 15 mg by mouth at bedtime.   OMEPRAZOLE (PRILOSEC) 20 MG CAPSULE    Take 1 capsule (20 mg total) by mouth 2 (two) times daily.   POLYVINYL ALCOHOL (LIQUID TEARS OP)    Apply 1 drop to eye every 8 (eight) hours as needed. To both eyes  Modified Medications   No medications on file  Discontinued Medications   No medications on file     SIGNIFICANT DIAGNOSTIC EXAMS  03-07-14: chest x-ray: right lower lobe mass (2.5 cm)   03-19-14: ct of chest: No parenchymal mass is noted within the lungs. No acute abnormality noted.   LABS REVIEWED:  01-29-14: glucose 123; bun 12.6; creat 0.79; k+4.2; na++ 143; hgb a1c 6.8;  Urine micro-albumin: 1.2 02-19-14: tsh 2.66  03-10-14: wbc 8.1; hgb 11.7; hct 39.8; mcv 86.6; plt 243; glucose 176; bun 12.6; creat 0.74; k+4.3; na++143  05-21-14: glucose 226; bun 13.7; creat 0.76; k+4.4; na++140; liver normal albumin 3.8; hgb a1c 7.0  chol 158; ldl 80; trig 169; hdl 45;  08-11-14: wbc 7.1; hgb 11.8; hct 39.4; mcv 85.7; plt 240; tsh 3.23; vit b12: 246; folate 18.1; vit d 24.21  09-09-14: glucose 137; bun 12.4; creat 0.74; k+3.9; na++142; liver normal albumin 3.5; hgb a1c 6.7      Review of Systems Constitutional: Negative for malaise/fatigue.  Respiratory: Negative for cough and shortness of breath.   Cardiovascular: Negative for chest pain, palpitations and leg swelling.  Gastrointestinal: Negative for heartburn, abdominal pain and constipation.  Musculoskeletal: Negative for myalgias and joint pain.  Skin: Negative.   Neurological: Negative for headaches.  Psychiatric/Behavioral: The patient is not nervous/anxious.     Physical Exam Constitutional: No distress.  Overweight   Neck: Neck supple. No JVD present. No thyromegaly present.  Cardiovascular: Normal rate, regular rhythm and intact distal pulses.   Respiratory: Effort normal and breath sounds normal. No respiratory distress.  GI: Soft. Bowel sounds are normal. She exhibits no distension. There is no tenderness.  Musculoskeletal: She exhibits no edema.  Is able to move all extremities Has abnormal tongue movement when asked to stick out tongue and has lower lip tremor; this is improved    Neurological: She is alert.  Skin: Skin is warm and dry. She is not diaphoretic.     ASSESSMENT/ PLAN:  1. Dementia without behavior disturbance; is without change will continue aricept 10 mg daily and namenda xr 28 mg daily will not make changes will monitor   2. Tremor: she does not have a resting tremor present she does have abnormal tongue movement and lip tremor. Will continue cogentin 0.5 mg nightly   3. Dyslipidemia: will continue lipitor 20 mg daily ldl is 80  4. Diabetes is stable is taking  lantus 10 units nightly; glipizide 10 mg daily  her hgb a1c is 7.0.  and will monitor her status.   5. Debra Tran: will continue prilosec 20 mg  daily   6. Constipation: will  change colace to daily   7. Depression: will continue lexapro 10 mg nightly and pamelor 25 mg nightly and will monitor   8. Schizophrenia: is stable will continue zyprexa 15 mg nightly and will monitor   Is followed by psych services.  Will begin her on cogentin 0.5 mg nightly for tardive dyskinesia and will monitor   9. Macular degeneration: will contine  zaditor to both eyes twice daily  Will have nursing check blood pressure twice daily for one week and report.       Ok Edwards NP [facility]  Contact [phone] Monday through Friday 8am- 5pm  After hours call [phone]

## 2014-12-22 ENCOUNTER — Ambulatory Visit (INDEPENDENT_AMBULATORY_CARE_PROVIDER_SITE_OTHER): Payer: Medicare Other | Admitting: Podiatry

## 2014-12-22 ENCOUNTER — Encounter: Payer: Self-pay | Admitting: Podiatry

## 2014-12-22 VITALS — BP 160/78 | HR 69 | Temp 98.1°F | Resp 12

## 2014-12-22 DIAGNOSIS — E1140 Type 2 diabetes mellitus with diabetic neuropathy, unspecified: Secondary | ICD-10-CM | POA: Diagnosis not present

## 2014-12-22 DIAGNOSIS — L03115 Cellulitis of right lower limb: Secondary | ICD-10-CM

## 2014-12-22 NOTE — Progress Notes (Signed)
   Subjective:    Patient ID: Debra Tran, female    DOB: 18-Oct-1927, 81 y.o.   MRN: [id number]  HPI This patient presents with her daughter for follow-up visit for low-grade cellulitis and friction irritation of the first MPJ associated with shoe rub from newly dispensed diabetic shoes. There is a history of wearing the diabetic shoes without any socks. Patient was placed on cephalexin 500 mg twice a day 7 days and the visit of 12/14/2014. Also, patient is applying triple antibiotic ointment to the first MPJ daily and covering with gauze. She has discontinued wearing diabetic shoes and wearing either socks or slippers   Review of Systems     Objective:   Physical Exam  Daughters responding to questioning The first MPJs have underlying bunions with mild crusting and eschars over the area. There is no surrounding erythema, edema or active drainage noted in the first MPJ bilaterally    Assessment & Plan:   Assessment: Improving low-grade cellulitis in friction rub area over the first MPJ bilaterally  Plan: Continue to apply topical antibiotic ointment and dressings over the first MPJ daily until a scab sloughs skin appears normal Do not wear recently dispensed diabetic shoes Continue wearing socks or slippers Also, will reevaluate the sizing of the recently dispensed diabetic shoes. The question is whether patient wore without socks which resulted in friction rub, or the shoes are too small  Reappoint for schedule visit for nail debridement

## 2014-12-22 NOTE — Patient Instructions (Signed)
Complete the remaining antibiotic prescribed on the visit of 12/14/2014 Continue applying topical antibiotic ointment daily to the bunion joints right and left feet and cover with gauze until the scabs sloughs and skin appears normal Continue wearing soft slippers or hospital socks until replacement diabetic shoes are available

## 2014-12-29 ENCOUNTER — Ambulatory Visit (INDEPENDENT_AMBULATORY_CARE_PROVIDER_SITE_OTHER): Payer: Medicare Other | Admitting: Podiatry

## 2014-12-29 ENCOUNTER — Encounter: Payer: Self-pay | Admitting: Podiatry

## 2014-12-29 ENCOUNTER — Encounter (INDEPENDENT_AMBULATORY_CARE_PROVIDER_SITE_OTHER): Payer: Medicare Other | Admitting: Ophthalmology

## 2014-12-29 DIAGNOSIS — E1140 Type 2 diabetes mellitus with diabetic neuropathy, unspecified: Secondary | ICD-10-CM

## 2014-12-29 DIAGNOSIS — L603 Nail dystrophy: Secondary | ICD-10-CM

## 2014-12-29 NOTE — Patient Instructions (Signed)
Continue apply topical antibiotic ointment daily to the right and left great toe joints and cover with gauze pads until healed Continue to wear soft slippers or hospital socks Do not wear diabetic shoes

## 2014-12-29 NOTE — Progress Notes (Signed)
Patient ID: Debra Tran, female   DOB: 07-Jan-1928, 81 y.o.   MRN: [id number]  Subjective: This patient presents for scheduled visit for debridement of toenails. She is also had visits for low-grade friction irritations of the first MPJ with low-grade cellulitis. She is currently applying topical antibiotic ointment dressings to the first MPJ daily. These areas started after dispensing diabetic shoes and she was instructed not to wear these any further  Objective: Patient seated in a wheelchair not able to transfer The medial right first MPJ has inflamed skin over the right first MPJ without any active drainage or malodor or warmth Left first MPJ has a eschar with low-grade erythema Bilateral HAV's The toenails are incurvated, dystrophic, elongated 6-10  Assessment: Resolving low-grade friction rub cellulitis first MPJ bilaterally Incurvated toenails 6-10 Diabetic peripheral neuropathy  Plan: Debride incurvated toenails 6-10 mechanically electing without any bleeding Maintain topical antibiotic ointment and gauze dressing the first MPJ bilaterally Maintain slippers are surgical socks on right and left feet Reappoint every 3 months for nail debridement  Reappoint at next scheduled visit  for follow-up on diabetic shoes

## 2014-12-30 ENCOUNTER — Encounter (INDEPENDENT_AMBULATORY_CARE_PROVIDER_SITE_OTHER): Payer: Medicare Other | Admitting: Ophthalmology

## 2014-12-30 DIAGNOSIS — H43813 Vitreous degeneration, bilateral: Secondary | ICD-10-CM | POA: Diagnosis not present

## 2014-12-30 DIAGNOSIS — H3532 Exudative age-related macular degeneration: Secondary | ICD-10-CM

## 2014-12-30 DIAGNOSIS — I10 Essential (primary) hypertension: Secondary | ICD-10-CM

## 2014-12-30 DIAGNOSIS — H35033 Hypertensive retinopathy, bilateral: Secondary | ICD-10-CM

## 2014-12-30 DIAGNOSIS — H3531 Nonexudative age-related macular degeneration: Secondary | ICD-10-CM | POA: Diagnosis not present

## 2015-01-18 ENCOUNTER — Ambulatory Visit: Payer: Medicare Other | Admitting: Podiatry

## 2015-01-27 ENCOUNTER — Non-Acute Institutional Stay (SKILLED_NURSING_FACILITY): Payer: Medicare Other | Admitting: Adult Health

## 2015-01-27 DIAGNOSIS — F209 Schizophrenia, unspecified: Secondary | ICD-10-CM

## 2015-01-27 DIAGNOSIS — E1169 Type 2 diabetes mellitus with other specified complication: Secondary | ICD-10-CM

## 2015-01-27 DIAGNOSIS — K5909 Other constipation: Secondary | ICD-10-CM

## 2015-01-27 DIAGNOSIS — Z794 Long term (current) use of insulin: Secondary | ICD-10-CM | POA: Diagnosis not present

## 2015-01-27 DIAGNOSIS — E119 Type 2 diabetes mellitus without complications: Secondary | ICD-10-CM | POA: Diagnosis not present

## 2015-01-27 DIAGNOSIS — K5900 Constipation, unspecified: Secondary | ICD-10-CM

## 2015-01-27 DIAGNOSIS — E785 Hyperlipidemia, unspecified: Secondary | ICD-10-CM

## 2015-01-27 DIAGNOSIS — K219 Gastro-esophageal reflux disease without esophagitis: Secondary | ICD-10-CM

## 2015-01-27 DIAGNOSIS — G2401 Drug induced subacute dyskinesia: Secondary | ICD-10-CM | POA: Diagnosis not present

## 2015-01-27 DIAGNOSIS — I10 Essential (primary) hypertension: Secondary | ICD-10-CM | POA: Diagnosis not present

## 2015-01-27 DIAGNOSIS — F0390 Unspecified dementia without behavioral disturbance: Secondary | ICD-10-CM | POA: Diagnosis not present

## 2015-01-27 NOTE — Progress Notes (Signed)
Patient ID: Debra Tran, female   DOB: 03/06/1928, 81 y.o.   MRN: [id number]    Facility: [facility]      Allergies  Allergen Reactions  . Ether Nausea And Vomiting    Chief Complaint  Patient presents with  . Medical Management of Chronic Issues    HPI:  She is a long term resident of this facility being seen for the management of her chronic illnesses. Overall there is little change in her status. She tells me that she is doing well and is feeling good. There are no nursing concerns at this time.     Past Medical History  Diagnosis Date  . Phobia   . Macular degeneration   . Narcissism   . Schizophrenia, paranoid   . Fungal dermatitis   . Hyperlipidemia   . Mild cognitive impairment   . Diabetes mellitus type 2 in obese   . Stress incontinence, female   . Allergic rhinitis   . Normocytic anemia   . Depressive disorder   . Hypertension   . CATARACT 06/06/2006    Qualifier: Diagnosis of  By: Genene Churn MD, Janett Billow      Past Surgical History  Procedure Laterality Date  . Cataract extraction  6 16 2006  . Cryotherapy  2 15 2006    facial AK  . Total abdominal hysterectomy w/ bilateral salpingoophorectomy      due to endometriosis  non malignant  . Hyperplastic   4 20 2006    AK shave biopsy  . Abdominal hysterectomy      due to fibroids  . Intraocular injection  [id number]    VITAL SIGNS BP 148/72 mmHg  Pulse 78  Ht 4\' 10"  (1.473 m)  Wt 169 lb (76.658 kg)  BMI 35.33 kg/m2  SpO2 96%  Patient's Medications  New Prescriptions   No medications on file  Previous Medications   ASPIRIN EC 81 MG TABLET    Take 81 mg by mouth every morning. For heart disease   ATORVASTATIN (LIPITOR) 20 MG TABLET    Take 1 tablet (20 mg total) by mouth at bedtime.   BENZTROPINE (COGENTIN) 1 MG TABLET    Take 0.5 tablets (0.5 mg total) by mouth 2 (two) times daily.   CALCIUM CARBONATE-VITAMIN D (CALCIUM 600-D) 600-400 MG-UNIT PER TABLET    Take 1 tablet by mouth  every morning.    CETIRIZINE (ZYRTEC) 10 MG TABLET    Take 10 mg by mouth daily.   CHOLECALCIFEROL (VITAMIN D) 1000 UNITS TABLET    Take 1,000 Units by mouth daily.   CYANOCOBALAMIN 1000 MCG TABLET    Take 100 mcg by mouth daily.   DOCUSATE SODIUM (COLACE) 100 MG CAPSULE    Take 1 capsule (100 mg total) by mouth daily. For constipation   DONEPEZIL (ARICEPT) 10 MG TABLET    Take 10 mg by mouth at bedtime.   ESCITALOPRAM (LEXAPRO) 10 MG TABLET    Take 20 mg by mouth at bedtime. For depression   GLIPIZIDE (GLUCOTROL) 10 MG TABLET    Take 10 mg by mouth 2 (two) times daily before a meal.    INSULIN GLARGINE (LANTUS) 100 UNIT/ML INJECTION    Inject 10 Units into the skin at bedtime. For diabetes   KETOTIFEN (ZADITOR) 0.025 % OPHTHALMIC SOLUTION    Place 1 drop into both eyes 2 (two) times daily.   MEMANTINE HCL ER (NAMENDA XR) 28 MG CP24    Take 28 mg by mouth daily.  For dementia   MULTIPLE VITAMIN (MULTIVITAMIN WITH MINERALS) TABS    Take 1 tablet by mouth every morning.   NEOMYCIN-POLYMYXIN B-DEXAMETHASONE (MAXITROL) 3.5-10000-0.1 OINT    Place 1 application into the right eye 3 (three) times daily.   NORTRIPTYLINE (PAMELOR) 75 MG CAPSULE    Take 25 mg by mouth at bedtime. Depressive disorder   OLANZAPINE (ZYPREXA) 5 MG TABLET    Take 15 mg by mouth at bedtime.   OMEPRAZOLE (PRILOSEC) 20 MG CAPSULE    Take 1 capsule (20 mg total) by mouth 2 (two) times daily.   POLYVINYL ALCOHOL (LIQUID TEARS OP)    Apply 1 drop to eye every 8 (eight) hours as needed. To both eyes  Modified Medications   No medications on file  Discontinued Medications     SIGNIFICANT DIAGNOSTIC EXAMS   03-07-14: chest x-ray: right lower lobe mass (2.5 cm)   03-19-14: ct of chest: No parenchymal mass is noted within the lungs. No acute abnormality Noted.  08-19-14: mammogram: scattered fibroglandular tissue   12-30-14: right eye intraocular injection    LABS REVIEWED:  01-29-14: glucose 123; bun 12.6; creat 0.79;  k+4.2; na++ 143; hgb a1c 6.8;  Urine micro-albumin: 1.2 02-19-14: tsh 2.66  03-10-14: wbc 8.1; hgb 11.7; hct 39.8; mcv 86.6; plt 243; glucose 176; bun 12.6; creat 0.74; k+4.3; na++143  05-21-14: glucose 226; bun 13.7; creat 0.76; k+4.4; na++140; liver normal albumin 3.8; hgb a1c 7.0 chol 158; ldl 80; trig 169; hdl 45;  08-11-14: wbc 7.1; hgb 11.8; hct 39.4; mcv 85.7; plt 240; tsh 3.23; vit b12: 246; folate 18.1; vit d 24.21  09-09-14: glucose 137; bun 12.4; creat 0.74; k+3.9; na++142; liver normal albumin 3.5; hgb a1c 6.7  01-10-15: glucose 200; bun 14.0; creat 0.87; k+ 4.2; na++ 143; liver normal albumin 3.5; hgb a1c 6.7  Chol 147; ldl 54; trig 248; hdl 44      Review of Systems Constitutional: Negative for malaise/fatigue.  Respiratory: Negative for cough and shortness of breath.   Cardiovascular: Negative for chest pain, palpitations and leg swelling.  Gastrointestinal: Negative for heartburn, abdominal pain and constipation.  Musculoskeletal: Negative for myalgias and joint pain.  Skin: Negative.   Neurological: Negative for headaches.  Psychiatric/Behavioral: The patient is not nervous/anxious.       Physical Exam Constitutional: No distress.  Overweight   Neck: Neck supple. No JVD present. No thyromegaly present.  Cardiovascular: Normal rate, regular rhythm and intact distal pulses.   Respiratory: Effort normal and breath sounds normal. No respiratory distress.  GI: Soft. Bowel sounds are normal. She exhibits no distension. There is no tenderness.  Musculoskeletal: She exhibits no edema.  Is able to move all extremities Has slight abnormal tongue movement  Neurological: She is alert.  Skin: Skin is warm and dry. She is not diaphoretic.       ASSESSMENT/ PLAN:  1. Dementia without behavior disturbance; is without change will continue aricept 10 mg daily and namenda xr 28 mg daily will not make changes will monitor   Her current weight is 169 pounds.   2. Tremor: she does  not have a resting tremor present she does have abnormal tongue movement and lip tremor. Will continue cogentin 0.5 mg nightly   3. Dyslipidemia: will continue lipitor 20 mg daily ldl is 54  4. Diabetes is stable is taking  lantus 10 units nightly; glipizide 10 mg  Twice daily  her hgb a1c is 6.7.  and will monitor her status.   5.  [name]: will continue prilosec 20 mg  daily   6. Constipation: will continue colace daily    7. Depression: will continue lexapro 20 mg nightly and pamelor 25 mg nightly and will monitor   8. Schizophrenia: is stable will continue zyprexa 15 mg nightly and will monitor   Is followed by psych services.  Will begin her on cogentin 0.5 mg nightly for tardive dyskinesia and will monitor   9. Macular degeneration: will contine  zaditor to both eyes twice daily     Ok Edwards NP [facility]  Contact [phone] Monday through Friday 8am- 5pm  After hours call [phone]

## 2015-02-02 DIAGNOSIS — F259 Schizoaffective disorder, unspecified: Secondary | ICD-10-CM | POA: Diagnosis not present

## 2015-02-02 DIAGNOSIS — F329 Major depressive disorder, single episode, unspecified: Secondary | ICD-10-CM | POA: Diagnosis not present

## 2015-02-02 DIAGNOSIS — F0391 Unspecified dementia with behavioral disturbance: Secondary | ICD-10-CM | POA: Diagnosis not present

## 2015-02-15 ENCOUNTER — Ambulatory Visit: Payer: Medicare Other | Admitting: Podiatry

## 2015-02-15 ENCOUNTER — Encounter: Payer: Self-pay | Admitting: Adult Health

## 2015-02-15 ENCOUNTER — Non-Acute Institutional Stay (SKILLED_NURSING_FACILITY): Payer: Medicare Other | Admitting: Adult Health

## 2015-02-15 DIAGNOSIS — L853 Xerosis cutis: Secondary | ICD-10-CM

## 2015-02-15 DIAGNOSIS — L89152 Pressure ulcer of sacral region, stage 2: Secondary | ICD-10-CM | POA: Diagnosis not present

## 2015-02-15 NOTE — Progress Notes (Signed)
Patient ID: Debra Tran, female   DOB: 18-Apr-1927, 81 y.o.   MRN: [id number]    Facility: [facility]      Allergies  Allergen Reactions  . Ether Nausea And Vomiting    Chief Complaint  Patient presents with  . Acute Visit    family concerns     HPI:  Her family is concerned about her having dry skin on both arms and is concerned about her "sacral wound". She is not voicing any complaints and denies itching or pain on her arms.   Past Medical History  Diagnosis Date  . Phobia   . Macular degeneration   . Narcissism   . Schizophrenia, paranoid ([place])   . Fungal dermatitis   . Hyperlipidemia   . Mild cognitive impairment   . Diabetes mellitus type 2 in obese ([place])   . Stress incontinence, female   . Allergic rhinitis   . Normocytic anemia   . Depressive disorder   . Hypertension   . CATARACT 06/06/2006    Qualifier: Diagnosis of  By: Genene Churn MD, Janett Billow      Past Surgical History  Procedure Laterality Date  . Cataract extraction  6 16 2006  . Cryotherapy  2 15 2006    facial AK  . Total abdominal hysterectomy w/ bilateral salpingoophorectomy      due to endometriosis  non malignant  . Hyperplastic   4 20 2006    AK shave biopsy  . Abdominal hysterectomy      due to fibroids  . Intraocular injection  [id number]    VITAL SIGNS BP 138/80 mmHg  Pulse 76  Ht 4\' 10"  (1.473 m)  Wt 169 lb (76.658 kg)  BMI 35.33 kg/m2  Patient's Medications  New Prescriptions   No medications on file  Previous Medications   ASPIRIN EC 81 MG TABLET    Take 81 mg by mouth every morning. For heart disease   ATORVASTATIN (LIPITOR) 20 MG TABLET    Take 1 tablet (20 mg total) by mouth at bedtime.   BENZTROPINE (COGENTIN) 1 MG TABLET    Take 0.5 tablets (0.5 mg total) by mouth 2 (two) times daily.   CALCIUM CARBONATE-VITAMIN D (CALCIUM 600-D) 600-400 MG-UNIT PER TABLET    Take 1 tablet by mouth every morning.    CETIRIZINE (ZYRTEC) 10 MG TABLET    Take 10 mg by mouth  daily.   CHOLECALCIFEROL (VITAMIN D) 1000 UNITS TABLET    Take 1,000 Units by mouth daily.   CYANOCOBALAMIN 1000 MCG TABLET    Take 100 mcg by mouth daily.   DOCUSATE SODIUM (COLACE) 100 MG CAPSULE    Take 1 capsule (100 mg total) by mouth daily. For constipation   DONEPEZIL (ARICEPT) 10 MG TABLET    Take 10 mg by mouth at bedtime.   ESCITALOPRAM (LEXAPRO) 10 MG TABLET    Take 20 mg by mouth at bedtime. For depression   GLIPIZIDE (GLUCOTROL) 10 MG TABLET    Take 10 mg by mouth 2 (two) times daily before a meal.    INSULIN GLARGINE (LANTUS) 100 UNIT/ML INJECTION    Inject 10 Units into the skin at bedtime. For diabetes   KETOTIFEN (ZADITOR) 0.025 % OPHTHALMIC SOLUTION    Place 1 drop into both eyes 2 (two) times daily.   MEMANTINE HCL ER (NAMENDA XR) 28 MG CP24    Take 28 mg by mouth daily. For dementia   MULTIPLE VITAMIN (MULTIVITAMIN WITH MINERALS) TABS  Take 1 tablet by mouth every morning.   NEOMYCIN-POLYMYXIN B-DEXAMETHASONE (MAXITROL) 3.5-10000-0.1 OINT    Place 1 application into the right eye 3 (three) times daily.   NORTRIPTYLINE (PAMELOR) 75 MG CAPSULE    Take 25 mg by mouth at bedtime. Depressive disorder   OLANZAPINE (ZYPREXA) 5 MG TABLET    Take 15 mg by mouth at bedtime.   OMEPRAZOLE (PRILOSEC) 20 MG CAPSULE    Take 1 capsule (20 mg total) by mouth 2 (two) times daily.   POLYVINYL ALCOHOL (LIQUID TEARS OP)    Apply 1 drop to eye every 8 (eight) hours as needed. To both eyes  Modified Medications   No medications on file  Discontinued Medications   No medications on file     SIGNIFICANT DIAGNOSTIC EXAMS  03-07-14: chest x-ray: right lower lobe mass (2.5 cm)   03-19-14: ct of chest: No parenchymal mass is noted within the lungs. No acute abnormality Noted.  08-19-14: mammogram: scattered fibroglandular tissue   12-30-14: right eye intraocular injection    LABS REVIEWED:  01-29-14: glucose 123; bun 12.6; creat 0.79; k+4.2; na++ 143; hgb a1c 6.8;  Urine micro-albumin:  1.2 02-19-14: tsh 2.66  03-10-14: wbc 8.1; hgb 11.7; hct 39.8; mcv 86.6; plt 243; glucose 176; bun 12.6; creat 0.74; k+4.3; na++143  05-21-14: glucose 226; bun 13.7; creat 0.76; k+4.4; na++140; liver normal albumin 3.8; hgb a1c 7.0 chol 158; ldl 80; trig 169; hdl 45;  08-11-14: wbc 7.1; hgb 11.8; hct 39.4; mcv 85.7; plt 240; tsh 3.23; vit b12: 246; folate 18.1; vit d 24.21  09-09-14: glucose 137; bun 12.4; creat 0.74; k+3.9; na++142; liver normal albumin 3.5; hgb a1c 6.7  01-10-15: glucose 200; bun 14.0; creat 0.87; k+ 4.2; na++ 143; liver normal albumin 3.5; hgb a1c 6.7  Chol 147; ldl 54; trig 248; hdl 44     Review of Systems Constitutional: Negative for malaise/fatigue.  Respiratory: Negative for cough and shortness of breath.   Cardiovascular: Negative for chest pain, palpitations and leg swelling.  Gastrointestinal: Negative for heartburn, abdominal pain and constipation.  Musculoskeletal: Negative for myalgias and joint pain.  Skin: Negative.   Neurological: Negative for headaches.  Psychiatric/Behavioral: The patient is not nervous/anxious.     Physical Exam Constitutional: No distress.  Overweight   Neck: Neck supple. No JVD present. No thyromegaly present.  Cardiovascular: Normal rate, regular rhythm and intact distal pulses.   Respiratory: Effort normal and breath sounds normal. No respiratory distress.  GI: Soft. Bowel sounds are normal. She exhibits no distension. There is no tenderness.  Musculoskeletal: She exhibits no edema.  Is able to move all extremities Has slight abnormal tongue movement  Neurological: She is alert.  Skin: Skin is warm and dry. She is not diaphoretic.  Bilateral arms with flaky dry skin. No open lesions present Sacral area has a pinpoint stage II open area present will use calazyme to buttocks to protect skin       ASSESSMENT/ PLAN:  Dry skin Stage II sacral ulcer Will use triamcinolone to her bilateral arms daily Will continue treatment to  her sacrum per facility protocol      Ok Edwards NP [facility]  Contact [phone] Monday through Friday 8am- 5pm  After hours call [phone]

## 2015-02-18 ENCOUNTER — Ambulatory Visit: Payer: Medicare Other

## 2015-02-18 ENCOUNTER — Encounter: Payer: Self-pay | Admitting: Podiatry

## 2015-02-18 ENCOUNTER — Ambulatory Visit (INDEPENDENT_AMBULATORY_CARE_PROVIDER_SITE_OTHER): Payer: Medicare Other | Admitting: Podiatry

## 2015-02-18 VITALS — BP 146/77 | HR 79 | Resp 12

## 2015-02-18 DIAGNOSIS — M2011 Hallux valgus (acquired), right foot: Secondary | ICD-10-CM

## 2015-02-18 DIAGNOSIS — R238 Other skin changes: Secondary | ICD-10-CM

## 2015-02-18 DIAGNOSIS — E1140 Type 2 diabetes mellitus with diabetic neuropathy, unspecified: Secondary | ICD-10-CM | POA: Diagnosis not present

## 2015-02-18 DIAGNOSIS — L989 Disorder of the skin and subcutaneous tissue, unspecified: Secondary | ICD-10-CM

## 2015-02-18 NOTE — Patient Instructions (Signed)
You can stop putting antibiotic ointment over the bunion sites.  Wear tube form pads over the pads at all times when wearing shoes. You need to wear a soft slipper or shoe that does not put pressure on your bunions.  If there is any further skin breakdown to call the office immediately.  Monitor for any signs/symptoms of infection. Call the office immediately if any occur or go directly to the emergency room. Call with any questions/concerns.

## 2015-02-24 ENCOUNTER — Encounter: Payer: Self-pay | Admitting: Adult Health

## 2015-02-24 ENCOUNTER — Non-Acute Institutional Stay (SKILLED_NURSING_FACILITY): Payer: Medicare Other | Admitting: Adult Health

## 2015-02-24 DIAGNOSIS — E1169 Type 2 diabetes mellitus with other specified complication: Secondary | ICD-10-CM | POA: Diagnosis not present

## 2015-02-24 DIAGNOSIS — I10 Essential (primary) hypertension: Secondary | ICD-10-CM | POA: Diagnosis not present

## 2015-02-24 DIAGNOSIS — E785 Hyperlipidemia, unspecified: Secondary | ICD-10-CM | POA: Diagnosis not present

## 2015-02-24 DIAGNOSIS — K5900 Constipation, unspecified: Secondary | ICD-10-CM | POA: Diagnosis not present

## 2015-02-24 DIAGNOSIS — F209 Schizophrenia, unspecified: Secondary | ICD-10-CM

## 2015-02-24 DIAGNOSIS — Z794 Long term (current) use of insulin: Secondary | ICD-10-CM | POA: Diagnosis not present

## 2015-02-24 DIAGNOSIS — K219 Gastro-esophageal reflux disease without esophagitis: Secondary | ICD-10-CM

## 2015-02-24 DIAGNOSIS — E119 Type 2 diabetes mellitus without complications: Secondary | ICD-10-CM | POA: Diagnosis not present

## 2015-02-24 DIAGNOSIS — K5909 Other constipation: Secondary | ICD-10-CM

## 2015-02-24 DIAGNOSIS — F0390 Unspecified dementia without behavioral disturbance: Secondary | ICD-10-CM

## 2015-02-24 HISTORY — DX: Essential (primary) hypertension: I10

## 2015-02-24 MED ORDER — INSULIN GLARGINE 100 UNIT/ML ~~LOC~~ SOLN: 5.0000 [IU] | Freq: Every day | SUBCUTANEOUS | Status: DC

## 2015-02-24 MED ORDER — INSULIN LISPRO 100 UNIT/ML ~~LOC~~ SOLN: 3.0000 [IU] | Freq: Two times a day (BID) | SUBCUTANEOUS | Status: DC

## 2015-02-24 NOTE — Progress Notes (Signed)
Patient ID: Debra Tran, female   DOB: March 20, 1928, 81 y.o.   MRN: [id number]   Facility:  [facility]      Allergies  Allergen Reactions  . Ether Nausea And Vomiting    Chief Complaint  Patient presents with  . Medical Management of Chronic Issues    HPI:  She is a long term resident of this facility being seen for the management of her chronic illnesses. There is no significant change in her status. She tells me that she is feeling good and is not voicing any concerns or complaints. There are no nursing concerns at this time.    Past Medical History  Diagnosis Date  . Phobia   . Macular degeneration   . Narcissism   . Schizophrenia, paranoid ([place])   . Fungal dermatitis   . Hyperlipidemia   . Mild cognitive impairment   . Diabetes mellitus type 2 in obese ([place])   . Stress incontinence, female   . Allergic rhinitis   . Normocytic anemia   . Depressive disorder   . Hypertension   . CATARACT 06/06/2006    Qualifier: Diagnosis of  By: Genene Churn MD, Janett Billow      Past Surgical History  Procedure Laterality Date  . Cataract extraction  6 16 2006  . Cryotherapy  2 15 2006    facial AK  . Total abdominal hysterectomy w/ bilateral salpingoophorectomy      due to endometriosis  non malignant  . Hyperplastic   4 20 2006    AK shave biopsy  . Abdominal hysterectomy      due to fibroids  . Intraocular injection  [id number]    VITAL SIGNS BP 140/78 mmHg  Pulse 70  Ht 4\' 10"  (1.473 m)  Wt 171 lb (77.565 kg)  BMI 35.75 kg/m2  Patient's Medications  New Prescriptions   No medications on file  Previous Medications   ASPIRIN EC 81 MG TABLET    Take 81 mg by mouth every morning. For heart disease   ATORVASTATIN (LIPITOR) 20 MG TABLET    Take 1 tablet (20 mg total) by mouth at bedtime.   BENZTROPINE (COGENTIN) 1 MG TABLET    Take 0.5 tablets (0.5 mg total) by mouth 2 (two) times daily.   CALCIUM CARBONATE-VITAMIN D (CALCIUM 600-D) 600-400 MG-UNIT PER TABLET    Take 1 tablet  by mouth every morning.    CETIRIZINE (ZYRTEC) 10 MG TABLET    Take 10 mg by mouth daily.   CHOLECALCIFEROL (VITAMIN D) 1000 UNITS TABLET    Take 1,000 Units by mouth daily.   CYANOCOBALAMIN 1000 MCG TABLET    Take 100 mcg by mouth daily.   DOCUSATE SODIUM (COLACE) 100 MG CAPSULE    Take 1 capsule (100 mg total) by mouth daily. For constipation   DONEPEZIL (ARICEPT) 10 MG TABLET    Take 10 mg by mouth at bedtime.   ESCITALOPRAM (LEXAPRO) 10 MG TABLET    Take 20 mg by mouth at bedtime. For depression   GLIPIZIDE (GLUCOTROL) 10 MG TABLET    Take 10 mg by mouth 2 (two) times daily before a meal.    INSULIN GLARGINE (LANTUS) 100 UNIT/ML INJECTION    Inject 10 Units into the skin at bedtime. For diabetes   KETOTIFEN (ZADITOR) 0.025 % OPHTHALMIC SOLUTION    Place 1 drop into both eyes 2 (two) times daily.   MEMANTINE HCL ER (NAMENDA XR) 28 MG CP24    Take 28 mg by mouth daily.  For dementia   MULTIPLE VITAMIN (MULTIVITAMIN WITH MINERALS) TABS    Take 1 tablet by mouth every morning.   NEOMYCIN-POLYMYXIN B-DEXAMETHASONE (MAXITROL) 3.5-10000-0.1 OINT    Place 1 application into the right eye 3 (three) times daily.   NORTRIPTYLINE (PAMELOR) 75 MG CAPSULE    Take 25 mg by mouth at bedtime. Depressive disorder   OLANZAPINE (ZYPREXA) 5 MG TABLET    Take 15 mg by mouth at bedtime.   OMEPRAZOLE (PRILOSEC) 20 MG CAPSULE    Take 1 capsule (20 mg total) by mouth 2 (two) times daily.   POLYVINYL ALCOHOL (LIQUID TEARS OP)    Apply 1 drop to eye every 8 (eight) hours as needed. To both eyes  Modified Medications   No medications on file  Discontinued Medications   No medications on file     SIGNIFICANT DIAGNOSTIC EXAMS   03-07-14: chest x-ray: right lower lobe mass (2.5 cm)   03-19-14: ct of chest: No parenchymal mass is noted within the lungs. No acute abnormality Noted.  08-19-14: mammogram: scattered fibroglandular tissue   12-30-14: right eye intraocular injection    LABS REVIEWED:  01-29-14:  glucose 123; bun 12.6; creat 0.79; k+4.2; na++ 143; hgb a1c 6.8;  Urine micro-albumin: 1.2 02-19-14: tsh 2.66  03-10-14: wbc 8.1; hgb 11.7; hct 39.8; mcv 86.6; plt 243; glucose 176; bun 12.6; creat 0.74; k+4.3; na++143  05-21-14: glucose 226; bun 13.7; creat 0.76; k+4.4; na++140; liver normal albumin 3.8; hgb a1c 7.0 chol 158; ldl 80; trig 169; hdl 45;  08-11-14: wbc 7.1; hgb 11.8; hct 39.4; mcv 85.7; plt 240; tsh 3.23; vit b12: 246; folate 18.1; vit d 24.21  09-09-14: glucose 137; bun 12.4; creat 0.74; k+3.9; na++142; liver normal albumin 3.5; hgb a1c 6.7  01-10-15: glucose 200; bun 14.0; creat 0.87; k+ 4.2; na++ 143; liver normal albumin 3.5; hgb a1c 6.7  Chol 147; ldl 54; trig 248; hdl 44      Review of Systems Constitutional: Negative for malaise/fatigue.  Respiratory: Negative for cough and shortness of breath.   Cardiovascular: Negative for chest pain, palpitations and leg swelling.  Gastrointestinal: Negative for heartburn, abdominal pain and constipation.  Musculoskeletal: Negative for myalgias and joint pain.  Skin: Negative.   Neurological: Negative for headaches.  Psychiatric/Behavioral: The patient is not nervous/anxious.       Physical Exam Constitutional: No distress.  Overweight   Neck: Neck supple. No JVD present. No thyromegaly present.  Cardiovascular: Normal rate, regular rhythm and intact distal pulses.   Respiratory: Effort normal and breath sounds normal. No respiratory distress.  GI: Soft. Bowel sounds are normal. She exhibits no distension. There is no tenderness.  Musculoskeletal: She exhibits no edema.  Is able to move all extremities Has slight abnormal tongue movement  Neurological: She is alert.  Skin: Skin is warm and dry. She is not diaphoretic.       ASSESSMENT/ PLAN:  1. Dementia without behavior disturbance; is without change will continue aricept 10 mg daily and namenda xr 28 mg daily will not make changes will monitor   Her current weight is  171 pounds.   2. Tremor: she does not have a resting tremor present she does have abnormal tongue movement and lip tremor. Will continue cogentin 0.5 mg nightly   3. Dyslipidemia: will continue lipitor 20 mg daily ldl is 54  4. Diabetes her morning cbg's have been less than 100;   is taking   glipizide 10 mg  twice daily  Will lower her  lantus to 5 units; will begin humalog 3 units with lunch and supper for cbg >=200; her hgb a1c is 6.7.  and will monitor her status.   5. [name]: will continue prilosec 20 mg  daily   6. Constipation: will continue colace daily    7. Depression: will continue lexapro 20 mg nightly and pamelor 25 mg nightly and will monitor   8. Schizophrenia: is stable will continue zyprexa 15 mg nightly and will monitor   Is followed by psych services.  Will continue cogentin 0.5 mg nightly for tardive dyskinesia and will monitor  She requires this current dose of zyprexa at this time; as she is emotionally stable; lower her dose could adversely affect her quality of life and the benefits of this medication outweigh the risks.   9. Macular degeneration: will contine  zaditor to both eyes twice daily will continue maxitrol to right eye three times dail       Ok Edwards NP [facility]  Contact [phone] Monday through Friday 8am- 5pm  After hours call [phone]

## 2015-02-25 DIAGNOSIS — E118 Type 2 diabetes mellitus with unspecified complications: Secondary | ICD-10-CM | POA: Diagnosis not present

## 2015-02-25 NOTE — Progress Notes (Signed)
Patient ID: Debra Tran, female   DOB: 1927-04-28, 81 y.o.   MRN: [id number]  Subjective: 81 year old female presents the office they with her daughter for follow-up evaluation of redness along the bunion on the right side. The patient's daughter states of this previously and ulceration to this area which is healed however there is redness overlying the prominent bunion. They've been applying Neosporin over the area daily. She recently is also picked up shoes however she would like to talk to Debra Tran as the shoes were not fitting appropriately. She believes it this may what started the wound. She has since changes shoes which seems to help. No other complaints at this time.  Objective: AAO 3, NAD DP/PT pulses 1/4, CRT less than 3 seconds Protective sensation decreased with Simms Weinstein monofilament There is a prominent bunion deformity present bilaterally at the right side worse than left. There is erythema on the medial aspect of the first metatarsal head from irritation in shoe gear. There is no open lesion or any drainage. There is no tenderness to palpation over the area at this time. There is no increase in warmth, ascending cellulitis, fluctuance, crepitus, malodor, drainage/purulence. The erythema appears to be more irritation from shoes as opposed to infection. No other open lesions or pre-ulcerative lesions. No pain with calf compression, swelling, warmth, erythema.  Assessment: 81 year old female right prominent bunion deformity with pre-ulcerative lesion  Plan: -Treatment options discussed including all alternatives, risks, and complications -At this time appears that there is no ulceration present in the erythema is likely due to irritation in shoe gear. Dispensed offloading pads to help protect the area and set her shoes. Also recommended shoe gear changes. She has discontinued wearing the shoes that she believes has started the wound and this seems to help as well. Continue to  monitor closely for any skin breakdown. The patient a changes to call the office immediately.Monitor for any clinical signs or symptoms of infection and directed to call the office immediately should any occur or go to the ER. -Follow-up in 4 weeks or sooner if any problems arise. In the meantime, encouraged to call the office with any questions, concerns, change in symptoms. Routine care as well next appointment.   Celesta Gentile, DPM

## 2015-03-30 ENCOUNTER — Non-Acute Institutional Stay (SKILLED_NURSING_FACILITY): Payer: Medicare Other | Admitting: Adult Health

## 2015-03-30 DIAGNOSIS — E1169 Type 2 diabetes mellitus with other specified complication: Secondary | ICD-10-CM

## 2015-03-30 DIAGNOSIS — E785 Hyperlipidemia, unspecified: Secondary | ICD-10-CM

## 2015-03-30 DIAGNOSIS — F0390 Unspecified dementia without behavioral disturbance: Secondary | ICD-10-CM

## 2015-03-30 DIAGNOSIS — Z794 Long term (current) use of insulin: Secondary | ICD-10-CM

## 2015-03-30 DIAGNOSIS — I10 Essential (primary) hypertension: Secondary | ICD-10-CM

## 2015-03-30 DIAGNOSIS — F32A Depression, unspecified: Secondary | ICD-10-CM

## 2015-03-30 DIAGNOSIS — F209 Schizophrenia, unspecified: Secondary | ICD-10-CM | POA: Diagnosis not present

## 2015-03-30 DIAGNOSIS — E119 Type 2 diabetes mellitus without complications: Secondary | ICD-10-CM

## 2015-03-30 DIAGNOSIS — G2401 Drug induced subacute dyskinesia: Secondary | ICD-10-CM | POA: Diagnosis not present

## 2015-03-30 DIAGNOSIS — K219 Gastro-esophageal reflux disease without esophagitis: Secondary | ICD-10-CM

## 2015-03-30 DIAGNOSIS — F329 Major depressive disorder, single episode, unspecified: Secondary | ICD-10-CM

## 2015-03-30 DIAGNOSIS — K5900 Constipation, unspecified: Secondary | ICD-10-CM

## 2015-03-30 DIAGNOSIS — K5909 Other constipation: Secondary | ICD-10-CM

## 2015-04-06 ENCOUNTER — Ambulatory Visit (INDEPENDENT_AMBULATORY_CARE_PROVIDER_SITE_OTHER): Payer: Medicare Other | Admitting: Podiatry

## 2015-04-06 ENCOUNTER — Encounter: Payer: Self-pay | Admitting: Podiatry

## 2015-04-06 VITALS — BP 135/98 | HR 69 | Resp 12

## 2015-04-06 DIAGNOSIS — R238 Other skin changes: Secondary | ICD-10-CM

## 2015-04-06 DIAGNOSIS — M2011 Hallux valgus (acquired), right foot: Secondary | ICD-10-CM | POA: Diagnosis not present

## 2015-04-06 DIAGNOSIS — L989 Disorder of the skin and subcutaneous tissue, unspecified: Secondary | ICD-10-CM

## 2015-04-06 NOTE — Progress Notes (Signed)
   Subjective:    Patient ID: Debra Tran, female    DOB: 21-Dec-1927, 81 y.o.   MRN: [id number]  HPI This patient presents again today with her daughter present to treatment room to evaluate local redness along the bunion right first MPJ. Nursing home is applying a protective pad over the area. Patient had history of wearing diabetic shoes and area, however discontinue wearing the shoes when the redness resulted.   Review of Systems  Cardiovascular: Positive for leg swelling.  Musculoskeletal: Positive for joint swelling.       Objective:   Physical Exam Patient is delayed in responding to questioning  Vascular: DP and PT pulses 1/4 bilaterally Capillary reflex delay bilaterally  Neurological: Decreased sensation to 10 g monofilament wire bilaterally  Dermatological: Local erythema with scaling over medial first MPJ. There is no drainage, warmth or open wounds in this area  Musculoskeletal: HAV deformity right         Assessment & Plan:   Assessment: HAV deformity right Inflamed skin over right first MPJ without any obviously clinical sign of infection  Plan: I dispensed to foam for patient and her daughter to apply over right first MPJ on a daily basis and wear a slipper or sock over the area. Also at discharge are suggested to patient's daughter that she could rubbed area with some Vaseline before applying the to foam ASIS daughter advised to monitor area return as needed Reappoint at patient's request

## 2015-04-06 NOTE — Patient Instructions (Signed)
Place to foam over the right great toe joint daily until all the redness and inflammation resolves. Wear slippers are soft sock only on right foot  Gean Birchwood DPM 04/06/2015    Diabetes and Foot Care Diabetes may cause you to have problems because of poor blood supply (circulation) to your feet and legs. This may cause the skin on your feet to become thinner, break easier, and heal more slowly. Your skin may become dry, and the skin may peel and crack. You may also have nerve damage in your legs and feet causing decreased feeling in them. You may not notice minor injuries to your feet that could lead to infections or more serious problems. Taking care of your feet is one of the most important things you can do for yourself.  HOME CARE INSTRUCTIONS  Wear shoes at all times, even in the house. Do not go barefoot. Bare feet are easily injured.  Check your feet daily for blisters, cuts, and redness. If you cannot see the bottom of your feet, use a mirror or ask someone for help.  Wash your feet with warm water (do not use hot water) and mild soap. Then pat your feet and the areas between your toes until they are completely dry. Do not soak your feet as this can dry your skin.  Apply a moisturizing lotion or petroleum jelly (that does not contain alcohol and is unscented) to the skin on your feet and to dry, brittle toenails. Do not apply lotion between your toes.  Trim your toenails straight across. Do not dig under them or around the cuticle. File the edges of your nails with an emery board or nail file.  Do not cut corns or calluses or try to remove them with medicine.  Wear clean socks or stockings every day. Make sure they are not too tight. Do not wear knee-high stockings since they may decrease blood flow to your legs.  Wear shoes that fit properly and have enough cushioning. To break in new shoes, wear them for just a few hours a day. This prevents you from injuring your feet.  Always look in your shoes before you put them on to be sure there are no objects inside.  Do not cross your legs. This may decrease the blood flow to your feet.  If you find a minor scrape, cut, or break in the skin on your feet, keep it and the skin around it clean and dry. These areas may be cleansed with mild soap and water. Do not cleanse the area with peroxide, alcohol, or iodine.  When you remove an adhesive bandage, be sure not to damage the skin around it.  If you have a wound, look at it several times a day to make sure it is healing.  Do not use heating pads or hot water bottles. They may burn your skin. If you have lost feeling in your feet or legs, you may not know it is happening until it is too late.  Make sure your health care provider performs a complete foot exam at least annually or more often if you have foot problems. Report any cuts, sores, or bruises to your health care provider immediately. SEEK MEDICAL CARE IF:   You have an injury that is not healing.  You have cuts or breaks in the skin.  You have an ingrown nail.  You notice redness on your legs or feet.  You feel burning or tingling in your legs or feet.  You have pain or cramps in your legs and feet.  Your legs or feet are numb.  Your feet always feel cold. SEEK IMMEDIATE MEDICAL CARE IF:   There is increasing redness, swelling, or pain in or around a wound.  There is a red line that goes up your leg.  Pus is coming from a wound.  You develop a fever or as directed by your health care provider.  You notice a bad smell coming from an ulcer or wound.   This information is not intended to replace advice given to you by your health care provider. Make sure you discuss any questions you have with your health care provider.   Document Released: 03/23/2000 Document Revised: 11/26/2012 Document Reviewed: 09/02/2012 Elsevier Interactive Patient Education 2016 [employer] Inc[employer]  

## 2015-04-17 ENCOUNTER — Encounter: Payer: Self-pay | Admitting: Adult Health

## 2015-04-17 NOTE — Progress Notes (Signed)
Patient ID: Debra Tran, female   DOB: 22-May-1927, 82 y.o.   MRN: [id number]   Facility:  [facility]      Allergies  Allergen Reactions  . Ether Nausea And Vomiting    Chief Complaint  Patient presents with  . Medical Management of Chronic Issues    HPI:  She is a long term resident of this facility being seen for the management of her chronic illnesses. Overall there is little change in her status. Her current weight is 173 pounds. She is not voicing any complaints or concerns today. There are no nursing concerns today.    Past Medical History  Diagnosis Date  . Phobia   . Macular degeneration   . Narcissism   . Schizophrenia, paranoid ([place])   . Fungal dermatitis   . Hyperlipidemia   . Mild cognitive impairment   . Diabetes mellitus type 2 in obese ([place])   . Stress incontinence, female   . Allergic rhinitis   . Normocytic anemia   . Depressive disorder   . Hypertension   . CATARACT 06/06/2006    Qualifier: Diagnosis of  By: Genene Churn MD, Janett Billow      Past Surgical History  Procedure Laterality Date  . Cataract extraction  6 16 2006  . Cryotherapy  2 15 2006    facial AK  . Total abdominal hysterectomy w/ bilateral salpingoophorectomy      due to endometriosis  non malignant  . Hyperplastic   4 20 2006    AK shave biopsy  . Abdominal hysterectomy      due to fibroids  . Intraocular injection  [id number]    VITAL SIGNS BP 140/65 mmHg  Pulse 85  Ht 4\' 10"  (1.473 m)  Wt 173 lb (78.472 kg)  BMI 36.17 kg/m2  Patient's Medications  New Prescriptions   No medications on file  Previous Medications   ASPIRIN EC 81 MG TABLET    Take 81 mg by mouth every morning. For heart disease   ATORVASTATIN (LIPITOR) 20 MG TABLET    Take 1 tablet (20 mg total) by mouth at bedtime.   BENZTROPINE (COGENTIN) 1 MG TABLET    Take 0.5 tablets (0.5 mg total) by mouth 2 (two) times daily.   CALCIUM CARBONATE-VITAMIN D (CALCIUM 600-D) 600-400 MG-UNIT PER TABLET    Take 1 tablet by  mouth every morning.    CETIRIZINE (ZYRTEC) 10 MG TABLET    Take 10 mg by mouth daily.   CHOLECALCIFEROL (VITAMIN D) 1000 UNITS TABLET    Take 1,000 Units by mouth daily.   CYANOCOBALAMIN 1000 MCG TABLET    Take 100 mcg by mouth daily.   DOCUSATE SODIUM (COLACE) 100 MG CAPSULE    Take 1 capsule (100 mg total) by mouth daily. For constipation   DONEPEZIL (ARICEPT) 10 MG TABLET    Take 10 mg by mouth at bedtime.   ESCITALOPRAM (LEXAPRO) 10 MG TABLET    Take 20 mg by mouth at bedtime. For depression   GLIPIZIDE (GLUCOTROL) 10 MG TABLET    Take 5 mg by mouth 2 (two) times daily before a meal.    INSULIN GLARGINE (LANTUS) 100 UNIT/ML INJECTION    Inject 0.05 mLs (5 Units total) into the skin at bedtime. For diabetes   INSULIN LISPRO (HUMALOG) 100 UNIT/ML INJECTION    Inject 0.03 mLs (3 Units total) into the skin 2 (two) times daily with a meal. Give with lunch and supper for cbg >=200   MEMANTINE HCL ER (  NAMENDA XR) 28 MG CP24    Take 28 mg by mouth daily. For dementia   MULTIPLE VITAMIN (MULTIVITAMIN WITH MINERALS) TABS    Take 1 tablet by mouth every morning.   NEOMYCIN-POLYMYXIN B-DEXAMETHASONE (MAXITROL) 3.5-10000-0.1 OINT    Place 1 application into the right eye 3 (three) times daily.   NORTRIPTYLINE (PAMELOR) 10 MG CAPSULE    Take 10 mg by mouth at bedtime.   OLANZAPINE (ZYPREXA) 5 MG TABLET    Take 15 mg by mouth at bedtime.   OMEPRAZOLE (PRILOSEC) 20 MG CAPSULE    Take 1 capsule (20 mg total) by mouth 2 (two) times daily.   POLYVINYL ALCOHOL (LIQUID TEARS OP)    Apply 1 drop to eye every 8 (eight) hours as needed. To both eyes  Modified Medications   No medications on file  Discontinued Medications     SIGNIFICANT DIAGNOSTIC EXAMS    03-07-14: chest x-ray: right lower lobe mass (2.5 cm)   03-19-14: ct of chest: No parenchymal mass is noted within the lungs. No acute abnormality Noted.  08-19-14: mammogram: scattered fibroglandular tissue   12-30-14: right eye intraocular injection     LABS REVIEWED:  05-21-14: glucose 226; bun 13.7; creat 0.76; k+4.4; na++140; liver normal albumin 3.8; hgb a1c 7.0 chol 158; ldl 80; trig 169; hdl 45;  08-11-14: wbc 7.1; hgb 11.8; hct 39.4; mcv 85.7; plt 240; tsh 3.23; vit b12: 246; folate 18.1; vit d 24.21  09-09-14: glucose 137; bun 12.4; creat 0.74; k+3.9; na++142; liver normal albumin 3.5; hgb a1c 6.7  01-10-15: glucose 200; bun 14.0; creat 0.87; k+ 4.2; na++ 143; liver normal albumin 3.5; hgb a1c 6.7  Chol 147; ldl 54; trig 248; hdl 44  02-25-15: urine micro-albumin <1.2      Review of Systems Constitutional: Negative for malaise/fatigue.  Respiratory: Negative for cough and shortness of breath.   Cardiovascular: Negative for chest pain, palpitations and leg swelling.  Gastrointestinal: Negative for heartburn, abdominal pain and constipation.  Musculoskeletal: Negative for myalgias and joint pain.  Skin: Negative.   Neurological: Negative for headaches.  Psychiatric/Behavioral: The patient is not nervous/anxious.       Physical Exam Constitutional: No distress.  Overweight   Neck: Neck supple. No JVD present. No thyromegaly present.  Cardiovascular: Normal rate, regular rhythm and intact distal pulses.   Respiratory: Effort normal and breath sounds normal. No respiratory distress.  GI: Soft. Bowel sounds are normal. She exhibits no distension. There is no tenderness.  Musculoskeletal: She exhibits no edema.  Is able to move all extremities Has very little abnormal tongue movement  Neurological: She is alert.  Skin: Skin is warm and dry. She is not diaphoretic.       ASSESSMENT/ PLAN:  1. Dementia without behavior disturbance; is without change will continue aricept 10 mg daily and namenda xr 28 mg daily will not make changes will monitor   Her current weight is 173 pounds.   2. Tremor: she does not have a resting tremor present she does have abnormal tongue movement and lip tremor. Will continue cogentin 0.5 mg  nightly   3. Dyslipidemia: will continue lipitor 20 mg daily ldl is 54  4. Diabetes  Will continue lantus  5 units; will continue humalog 3 units with lunch and supper for cbg >=200; will continue glipizide 5 mg twice daily her hgb a1c is 6.7.  and will monitor her status.   5. [name]: will continue prilosec 20 mg  daily   6. Constipation:  will continue colace daily    7. Depression: will continue lexapro 20 mg nightly and pamelor 10 mg nightly and will monitor   8. Schizophrenia: is stable will continue zyprexa 15 mg nightly and will monitor   Is followed by psych services.  Will continue cogentin 0.5 mg nightly for tardive dyskinesia and will monitor  She requires this current dose of zyprexa at this time; as she is emotionally stable; lower her dose could adversely affect her quality of life and the benefits of this medication outweigh the risks.   9. Macular degeneration: will contine  maxitrol to right eye three times daily         Ok Edwards NP [facility]  Contact [phone] Monday through Friday 8am- 5pm  After hours call [phone]

## 2015-04-19 DIAGNOSIS — F259 Schizoaffective disorder, unspecified: Secondary | ICD-10-CM | POA: Diagnosis not present

## 2015-04-19 DIAGNOSIS — F329 Major depressive disorder, single episode, unspecified: Secondary | ICD-10-CM | POA: Diagnosis not present

## 2015-04-19 DIAGNOSIS — F0391 Unspecified dementia with behavioral disturbance: Secondary | ICD-10-CM | POA: Diagnosis not present

## 2015-04-20 ENCOUNTER — Encounter (INDEPENDENT_AMBULATORY_CARE_PROVIDER_SITE_OTHER): Payer: Medicare Other | Admitting: Ophthalmology

## 2015-04-20 DIAGNOSIS — H43813 Vitreous degeneration, bilateral: Secondary | ICD-10-CM | POA: Diagnosis not present

## 2015-04-20 DIAGNOSIS — E113313 Type 2 diabetes mellitus with moderate nonproliferative diabetic retinopathy with macular edema, bilateral: Secondary | ICD-10-CM

## 2015-04-20 DIAGNOSIS — E11311 Type 2 diabetes mellitus with unspecified diabetic retinopathy with macular edema: Secondary | ICD-10-CM

## 2015-04-20 DIAGNOSIS — H353231 Exudative age-related macular degeneration, bilateral, with active choroidal neovascularization: Secondary | ICD-10-CM | POA: Diagnosis not present

## 2015-04-20 DIAGNOSIS — H35033 Hypertensive retinopathy, bilateral: Secondary | ICD-10-CM | POA: Diagnosis not present

## 2015-04-20 DIAGNOSIS — I10 Essential (primary) hypertension: Secondary | ICD-10-CM

## 2015-04-26 DIAGNOSIS — E119 Type 2 diabetes mellitus without complications: Secondary | ICD-10-CM | POA: Diagnosis not present

## 2015-04-26 DIAGNOSIS — B351 Tinea unguium: Secondary | ICD-10-CM | POA: Diagnosis not present

## 2015-04-26 DIAGNOSIS — M79671 Pain in right foot: Secondary | ICD-10-CM | POA: Diagnosis not present

## 2015-04-26 DIAGNOSIS — I2510 Atherosclerotic heart disease of native coronary artery without angina pectoris: Secondary | ICD-10-CM | POA: Diagnosis not present

## 2015-05-09 ENCOUNTER — Non-Acute Institutional Stay (SKILLED_NURSING_FACILITY): Payer: Medicare Other | Admitting: Internal Medicine

## 2015-05-09 ENCOUNTER — Encounter: Payer: Self-pay | Admitting: Internal Medicine

## 2015-05-09 DIAGNOSIS — F32A Depression, unspecified: Secondary | ICD-10-CM

## 2015-05-09 DIAGNOSIS — G2401 Drug induced subacute dyskinesia: Secondary | ICD-10-CM | POA: Diagnosis not present

## 2015-05-09 DIAGNOSIS — F209 Schizophrenia, unspecified: Secondary | ICD-10-CM | POA: Diagnosis not present

## 2015-05-09 DIAGNOSIS — F329 Major depressive disorder, single episode, unspecified: Secondary | ICD-10-CM

## 2015-05-09 NOTE — Assessment & Plan Note (Signed)
is stable will continue zyprexa 15 mg nightly and will monitor Is followed by psych services.Cogentin has ben increased to 1 mg nightly for tardive dyskinesia and will monitor She requires this current dose of zyprexa at this time; as she is emotionally stable; lower her dose could adversely affect her quality of life and the benefits of this medication outweigh the risks.

## 2015-05-09 NOTE — Assessment & Plan Note (Signed)
Will inc cogentin fro 0.5 mg qHS to 1 mg qHS

## 2015-05-09 NOTE — Progress Notes (Signed)
MRN: [id number] Name: Debra Tran  Sex: female Age: 82 y.o. DOB: 07/08/1927  [place] #:  Facility/Room: Level Of Care: SNF Provider: Inocencio Homes D Emergency Contacts: Extended Emergency Contact Information Primary Emergency Contact: [name],[name] Address: [address]          [place], [place] [address] [name] of [place] Phone: [phone] Relation: Daughter  Code Status:   Allergies: Ether  Chief Complaint  Patient presents with  . Medical Management of Chronic Issues    HPI: Patient is 82 y.o. female who   Past Medical History  Diagnosis Date  . Phobia   . Macular degeneration   . Narcissism   . Schizophrenia, paranoid ([place])   . Fungal dermatitis   . Hyperlipidemia   . Mild cognitive impairment   . Diabetes mellitus type 2 in obese ([place])   . Stress incontinence, female   . Allergic rhinitis   . Normocytic anemia   . Depressive disorder   . Hypertension   . CATARACT 06/06/2006    Qualifier: Diagnosis of  By: Genene Churn MD, Janett Billow      Past Surgical History  Procedure Laterality Date  . Cataract extraction  6 16 2006  . Cryotherapy  2 15 2006    facial AK  . Total abdominal hysterectomy w/ bilateral salpingoophorectomy      due to endometriosis  non malignant  . Hyperplastic   4 20 2006    AK shave biopsy  . Abdominal hysterectomy      due to fibroids  . Intraocular injection  [id number]      Medication List       This list is accurate as of: 05/09/15 12:50 PM.  Always use your most recent med list.               aspirin EC 81 MG tablet  Take 81 mg by mouth every morning. For heart disease     atorvastatin 20 MG tablet  Commonly known as:  LIPITOR  Take 1 tablet (20 mg total) by mouth at bedtime.     benztropine 1 MG tablet  Commonly known as:  COGENTIN  Take 0.5 tablets (0.5 mg total) by mouth 2 (two) times daily.     CALCIUM 600-D 600-400 MG-UNIT tablet  Generic drug:  Calcium Carbonate-Vitamin D  Take 1 tablet by mouth every  morning.     cetirizine 10 MG tablet  Commonly known as:  ZYRTEC  Take 10 mg by mouth daily.     cholecalciferol 1000 units tablet  Commonly known as:  VITAMIN D  Take 1,000 Units by mouth daily.     cyanocobalamin 1000 MCG tablet  Take 100 mcg by mouth daily.     docusate sodium 100 MG capsule  Commonly known as:  COLACE  Take 1 capsule (100 mg total) by mouth daily. For constipation     donepezil 10 MG tablet  Commonly known as:  ARICEPT  Take 10 mg by mouth at bedtime.     escitalopram 10 MG tablet  Commonly known as:  LEXAPRO  Take 20 mg by mouth at bedtime. For depression     glipiZIDE 10 MG tablet  Commonly known as:  GLUCOTROL  Take 5 mg by mouth 2 (two) times daily before a meal.     insulin glargine 100 UNIT/ML injection  Commonly known as:  LANTUS  Inject 0.05 mLs (5 Units total) into the skin at bedtime. For diabetes     insulin lispro 100 UNIT/ML  injection  Commonly known as:  HUMALOG  Inject 0.03 mLs (3 Units total) into the skin 2 (two) times daily with a meal. Give with lunch and supper for cbg >=200     LIQUID TEARS OP  Apply 1 drop to eye every 8 (eight) hours as needed. To both eyes     multivitamin with minerals Tabs tablet  Take 1 tablet by mouth every morning.     NAMENDA XR 28 MG Cp24 24 hr capsule  Generic drug:  memantine  Take 28 mg by mouth daily. For dementia     neomycin-polymyxin b-dexamethasone 3.5-10000-0.1 Oint  Commonly known as:  MAXITROL  Place 1 application into the right eye 3 (three) times daily.     nortriptyline 10 MG capsule  Commonly known as:  PAMELOR  Take 10 mg by mouth at bedtime.     OLANZapine 5 MG tablet  Commonly known as:  ZYPREXA  Take 15 mg by mouth at bedtime.     omeprazole 20 MG capsule  Commonly known as:  PRILOSEC  Take 1 capsule (20 mg total) by mouth 2 (two) times daily.        No orders of the defined types were placed in this encounter.    Immunization History  Administered Date(s)  Administered  . Influenza Split 02/15/2011, 01/21/2012  . Influenza Whole 02/05/2007, 01/17/2010, 01/28/2013  . Influenza-Unspecified 06/04/2014  . Pneumococcal Polysaccharide-23 04/10/1999  . Td 10/07/2001    Social History  Substance Use Topics  . Smoking status: Former Research scientist (life sciences)  . Smokeless tobacco: Not on file  . Alcohol Use: No    Review of Systems  DATA OBTAINED: from daughter GENERAL:  no fevers, fatigue, appetite changes SKIN: No itching, rash HEENT: No complaint RESPIRATORY: No cough, wheezing, SOB CARDIAC: No chest pain, palpitations, lower extremity edema  GI: No abdominal pain, No N/V/D or constipation, No heartburn or reflux  GU: No dysuria, frequency or urgency, or incontinence  MUSCULOSKELETAL: No unrelieved bone/joint pain NEUROLOGIC: No headache, dizziness; she is having more inv mouth movements than prior  PSYCHIATRIC: No overt anxiety or sadness  Filed Vitals:   05/09/15 1237  BP: 138/71  Pulse: 93  Temp: 97.6 F (36.4 C)  Resp: 18    Physical Exam  GENERAL APPEARANCE: Alert, min conversant, No acute distress  SKIN: No diaphoresis rash, or wounds HEENT: Unremarkable RESPIRATORY: Breathing is even, unlabored. Lung sounds are clear   CARDIOVASCULAR: Heart RRR no murmurs, rubs or gallops. No peripheral edema  GASTROINTESTINAL: Abdomen is soft, non-tender, not distended w/ normal bowel sounds.  GENITOURINARY: Bladder non tender, not distended  MUSCULOSKELETAL: No abnormal joints or musculature NEUROLOGIC: Cranial nerves 2-12 grossly intact. Moves all extremities PSYCHIATRIC: flat, no behavioral issues  Patient Active Problem List   Diagnosis Date Noted  . Essential hypertension, benign 02/24/2015  . Stress incontinence 11/29/2014  . Tardive dyskinesia 09/15/2014  . Chronic constipation 07/03/2014  . GERD (gastroesophageal reflux disease) 05/11/2014  . Depressive disorder   . Hypertension   . Dementia 12/12/2011  . ARTHRITIS 09/20/2009  .  Controlled diabetes mellitus type II without complication ([place]) [id number]  . Hyperlipidemia associated with type 2 diabetes mellitus ([place]) 06/06/2006  . Schizophrenia ([place]) 06/06/2006  . Allergic rhinitis 06/06/2006    CBC    Component Value Date/Time   WBC 10.3 08/14/2014 2136   WBC 8.1 10/21/2013   RBC 4.56 08/14/2014 2136   HGB 12.0 08/14/2014 2136   HCT 39.5 08/14/2014 2136   PLT 268 08/14/2014  2136   MCV 86.6 08/14/2014 2136   LYMPHSABS 2.8 08/14/2014 2136   MONOABS 0.6 08/14/2014 2136   EOSABS 0.1 08/14/2014 2136   BASOSABS 0.0 08/14/2014 2136    CMP     Component Value Date/Time   NA 139 08/14/2014 2136   NA 140 05/21/2014   K 4.6 08/14/2014 2136   CL 102 08/14/2014 2136   CO2 31 08/14/2014 2136   GLUCOSE 125* 08/14/2014 2136   BUN 18 08/14/2014 2136   BUN 14 05/21/2014   CREATININE 0.76 08/14/2014 2136   CREATININE 0.8 05/21/2014   CREATININE 1.00 08/31/2011 1124   CALCIUM 9.1 08/14/2014 2136   PROT 7.2 08/14/2014 2136   ALBUMIN 3.6 08/14/2014 2136   AST 24 08/14/2014 2136   ALT 19 08/14/2014 2136   ALKPHOS 85 08/14/2014 2136   BILITOT 0.5 08/14/2014 2136   GFRNONAA >60 08/14/2014 2136   GFRAA >60 08/14/2014 2136    Assessment and Plan  Tardive dyskinesia Will inc cogentin fro 0.5 mg qHS to 1 mg qHS  Schizophrenia is stable will continue zyprexa 15 mg nightly and will monitor Is followed by psych services.Cogentin has ben increased to 1 mg nightly for tardive dyskinesia and will monitor She requires this current dose of zyprexa at this time; as she is emotionally stable; lower her dose could adversely affect her quality of life and the benefits of this medication outweigh the risks.   Depressive disorder Appears stable on lexapro 20 mg qHS and pamelor 10 mg q HS    Hennie Duos, MD

## 2015-05-09 NOTE — Assessment & Plan Note (Signed)
Appears stable on lexapro 20 mg qHS and pamelor 10 mg q HS

## 2015-05-20 DIAGNOSIS — R1311 Dysphagia, oral phase: Secondary | ICD-10-CM | POA: Diagnosis not present

## 2015-05-20 DIAGNOSIS — F0391 Unspecified dementia with behavioral disturbance: Secondary | ICD-10-CM | POA: Diagnosis not present

## 2015-05-23 DIAGNOSIS — R1311 Dysphagia, oral phase: Secondary | ICD-10-CM | POA: Diagnosis not present

## 2015-05-23 DIAGNOSIS — F0391 Unspecified dementia with behavioral disturbance: Secondary | ICD-10-CM | POA: Diagnosis not present

## 2015-05-24 DIAGNOSIS — R1311 Dysphagia, oral phase: Secondary | ICD-10-CM | POA: Diagnosis not present

## 2015-05-24 DIAGNOSIS — F0391 Unspecified dementia with behavioral disturbance: Secondary | ICD-10-CM | POA: Diagnosis not present

## 2015-05-25 DIAGNOSIS — F0391 Unspecified dementia with behavioral disturbance: Secondary | ICD-10-CM | POA: Diagnosis not present

## 2015-05-25 DIAGNOSIS — R1311 Dysphagia, oral phase: Secondary | ICD-10-CM | POA: Diagnosis not present

## 2015-05-26 ENCOUNTER — Other Ambulatory Visit (HOSPITAL_COMMUNITY): Payer: Self-pay | Admitting: Internal Medicine

## 2015-05-26 DIAGNOSIS — R1311 Dysphagia, oral phase: Secondary | ICD-10-CM

## 2015-05-26 DIAGNOSIS — F0391 Unspecified dementia with behavioral disturbance: Secondary | ICD-10-CM | POA: Diagnosis not present

## 2015-05-27 DIAGNOSIS — R1311 Dysphagia, oral phase: Secondary | ICD-10-CM | POA: Diagnosis not present

## 2015-05-27 DIAGNOSIS — F0391 Unspecified dementia with behavioral disturbance: Secondary | ICD-10-CM | POA: Diagnosis not present

## 2015-05-30 DIAGNOSIS — R1311 Dysphagia, oral phase: Secondary | ICD-10-CM | POA: Diagnosis not present

## 2015-05-30 DIAGNOSIS — F0391 Unspecified dementia with behavioral disturbance: Secondary | ICD-10-CM | POA: Diagnosis not present

## 2015-05-31 DIAGNOSIS — R1311 Dysphagia, oral phase: Secondary | ICD-10-CM | POA: Diagnosis not present

## 2015-05-31 DIAGNOSIS — F0391 Unspecified dementia with behavioral disturbance: Secondary | ICD-10-CM | POA: Diagnosis not present

## 2015-06-01 ENCOUNTER — Ambulatory Visit (HOSPITAL_COMMUNITY)
Admission: RE | Admit: 2015-06-01 | Discharge: 2015-06-01 | Disposition: A | Discharge status: Home / Self Care | Payer: Medicare Other | Source: Ambulatory Visit | Attending: Internal Medicine | Admitting: Internal Medicine

## 2015-06-01 DIAGNOSIS — M1990 Unspecified osteoarthritis, unspecified site: Secondary | ICD-10-CM | POA: Insufficient documentation

## 2015-06-01 DIAGNOSIS — R1310 Dysphagia, unspecified: Secondary | ICD-10-CM | POA: Diagnosis not present

## 2015-06-01 DIAGNOSIS — F259 Schizoaffective disorder, unspecified: Secondary | ICD-10-CM | POA: Diagnosis not present

## 2015-06-01 DIAGNOSIS — R1311 Dysphagia, oral phase: Secondary | ICD-10-CM

## 2015-06-01 DIAGNOSIS — E785 Hyperlipidemia, unspecified: Secondary | ICD-10-CM | POA: Diagnosis not present

## 2015-06-01 DIAGNOSIS — K219 Gastro-esophageal reflux disease without esophagitis: Secondary | ICD-10-CM | POA: Insufficient documentation

## 2015-06-01 DIAGNOSIS — F0390 Unspecified dementia without behavioral disturbance: Secondary | ICD-10-CM | POA: Diagnosis not present

## 2015-06-01 DIAGNOSIS — E119 Type 2 diabetes mellitus without complications: Secondary | ICD-10-CM | POA: Diagnosis not present

## 2015-06-01 DIAGNOSIS — F329 Major depressive disorder, single episode, unspecified: Secondary | ICD-10-CM | POA: Diagnosis not present

## 2015-06-01 DIAGNOSIS — H3530 Unspecified macular degeneration: Secondary | ICD-10-CM | POA: Diagnosis not present

## 2015-06-01 DIAGNOSIS — F0391 Unspecified dementia with behavioral disturbance: Secondary | ICD-10-CM | POA: Diagnosis not present

## 2015-06-01 NOTE — Progress Notes (Signed)
MBSS complete. Full report under Imaging in Chart Review. Recommend Dysphagia 1 (puree) diet, nectar thick liquids, meds crushed in puree.    Titus Mould, Student-SLP

## 2015-06-02 DIAGNOSIS — F0391 Unspecified dementia with behavioral disturbance: Secondary | ICD-10-CM | POA: Diagnosis not present

## 2015-06-02 DIAGNOSIS — R1311 Dysphagia, oral phase: Secondary | ICD-10-CM | POA: Diagnosis not present

## 2015-06-03 DIAGNOSIS — R1311 Dysphagia, oral phase: Secondary | ICD-10-CM | POA: Diagnosis not present

## 2015-06-03 DIAGNOSIS — F0391 Unspecified dementia with behavioral disturbance: Secondary | ICD-10-CM | POA: Diagnosis not present

## 2015-06-06 ENCOUNTER — Non-Acute Institutional Stay (SKILLED_NURSING_FACILITY): Payer: Medicare Other | Admitting: Internal Medicine

## 2015-06-06 ENCOUNTER — Encounter: Payer: Self-pay | Admitting: Internal Medicine

## 2015-06-06 DIAGNOSIS — K219 Gastro-esophageal reflux disease without esophagitis: Secondary | ICD-10-CM | POA: Diagnosis not present

## 2015-06-06 DIAGNOSIS — F0391 Unspecified dementia with behavioral disturbance: Secondary | ICD-10-CM | POA: Diagnosis not present

## 2015-06-06 DIAGNOSIS — K5909 Other constipation: Secondary | ICD-10-CM

## 2015-06-06 DIAGNOSIS — K5900 Constipation, unspecified: Secondary | ICD-10-CM | POA: Diagnosis not present

## 2015-06-06 DIAGNOSIS — R1311 Dysphagia, oral phase: Secondary | ICD-10-CM | POA: Diagnosis not present

## 2015-06-06 DIAGNOSIS — J301 Allergic rhinitis due to pollen: Secondary | ICD-10-CM

## 2015-06-06 NOTE — Progress Notes (Signed)
MRN: [id number] Name: Debra Tran  Sex: female Age: 82 y.o. DOB: [birth year]  [place] #: Karren Burly Facility/Room: Level Of Care: SNF Provider: Inocencio Homes D Emergency Contacts: Extended Emergency Contact Information Primary Emergency Contact: Weathington,[name] Address: [address]          [place], [place] [address] [name] of [place] Phone: [phone] Relation: Daughter  Code Status:   Allergies: Ether  Chief Complaint  Patient presents with  . Medical Management of Chronic Issues    HPI: Patient is 82 y.o. female who is being seen for routine issues of AR, GERD, and constipation.   Past Medical History  Diagnosis Date  . Phobia   . Macular degeneration   . Narcissism   . Schizophrenia, paranoid ([place])   . Fungal dermatitis   . Hyperlipidemia   . Mild cognitive impairment   . Diabetes mellitus type 2 in obese ([place])   . Stress incontinence, female   . Allergic rhinitis   . Normocytic anemia   . Depressive disorder   . Hypertension   . CATARACT 06/06/2006    Qualifier: Diagnosis of  By: Genene Churn MD, Janett Billow      Past Surgical History  Procedure Laterality Date  . Cataract extraction  6 16 2006  . Cryotherapy  2 15 2006    facial AK  . Total abdominal hysterectomy w/ bilateral salpingoophorectomy      due to endometriosis  non malignant  . Hyperplastic   4 20 2006    AK shave biopsy  . Abdominal hysterectomy      due to fibroids  . Intraocular injection  [id number]      Medication List       This list is accurate as of: 06/06/15 11:59 PM.  Always use your most recent med list.               aspirin EC 81 MG tablet  Take 81 mg by mouth every morning. For heart disease     atorvastatin 20 MG tablet  Commonly known as:  LIPITOR  Take 1 tablet (20 mg total) by mouth at bedtime.     benztropine 1 MG tablet  Commonly known as:  COGENTIN  Take 0.5 tablets (0.5 mg total) by mouth 2 (two) times daily.     CALCIUM 600-D 600-400 MG-UNIT tablet   Generic drug:  Calcium Carbonate-Vitamin D  Take 1 tablet by mouth every morning.     cetirizine 10 MG tablet  Commonly known as:  ZYRTEC  Take 10 mg by mouth daily.     cholecalciferol 1000 units tablet  Commonly known as:  VITAMIN D  Take 1,000 Units by mouth daily.     cyanocobalamin 1000 MCG tablet  Take 100 mcg by mouth daily.     docusate sodium 100 MG capsule  Commonly known as:  COLACE  Take 1 capsule (100 mg total) by mouth daily. For constipation     donepezil 10 MG tablet  Commonly known as:  ARICEPT  Take 10 mg by mouth at bedtime.     escitalopram 10 MG tablet  Commonly known as:  LEXAPRO  Take 20 mg by mouth at bedtime. For depression     glipiZIDE 10 MG tablet  Commonly known as:  GLUCOTROL  Take 5 mg by mouth 2 (two) times daily before a meal.     insulin glargine 100 UNIT/ML injection  Commonly known as:  LANTUS  Inject 0.05 mLs (5 Units total) into the skin at  bedtime. For diabetes     insulin lispro 100 UNIT/ML injection  Commonly known as:  HUMALOG  Inject 0.03 mLs (3 Units total) into the skin 2 (two) times daily with a meal. Give with lunch and supper for cbg >=200     LIQUID TEARS OP  Apply 1 drop to eye every 8 (eight) hours as needed. To both eyes     multivitamin with minerals Tabs tablet  Take 1 tablet by mouth every morning.     NAMENDA XR 28 MG Cp24 24 hr capsule  Generic drug:  memantine  Take 28 mg by mouth daily. For dementia     neomycin-polymyxin b-dexamethasone 3.5-10000-0.1 Oint  Commonly known as:  MAXITROL  Place 1 application into the right eye 3 (three) times daily.     nortriptyline 10 MG capsule  Commonly known as:  PAMELOR  Take 10 mg by mouth at bedtime.     OLANZapine 5 MG tablet  Commonly known as:  ZYPREXA  Take 15 mg by mouth at bedtime.     omeprazole 20 MG capsule  Commonly known as:  PRILOSEC  Take 1 capsule (20 mg total) by mouth 2 (two) times daily.        No orders of the defined types were  placed in this encounter.    Immunization History  Administered Date(s) Administered  . Influenza Split 02/15/2011, 01/21/2012  . Influenza Whole 02/05/2007, 01/17/2010, 01/28/2013  . Influenza-Unspecified 06/04/2014  . Pneumococcal Polysaccharide-23 04/10/1999  . Td 10/07/2001    Social History  Substance Use Topics  . Smoking status: Former Research scientist (life sciences)  . Smokeless tobacco: Not on file  . Alcohol Use: No    Review of Systems  UTO 2/2 dementia; nursing without concerns   Filed Vitals:   06/06/15 1525  BP: 126/76  Pulse: 65  Temp: 97.7 F (36.5 C)  Resp: 18    Physical Exam  GENERAL APPEARANCE: Alert, No acute distress  SKIN: No diaphoresis rash HEENT: Unremarkable RESPIRATORY: Breathing is even, unlabored. Lung sounds are clear   CARDIOVASCULAR: Heart RRR no murmurs, rubs or gallops. 1+ peripheral edema  GASTROINTESTINAL: Abdomen is soft, non-tender, not distended w/ normal bowel sounds.  GENITOURINARY: Bladder non tender, not distended  MUSCULOSKELETAL: No abnormal joints or musculature NEUROLOGIC: Cranial nerves 2-12 grossly intact. Moves all extremities PSYCHIATRIC: dementia, no behavioral issues  Patient Active Problem List   Diagnosis Date Noted  . Essential hypertension, benign 02/24/2015  . Stress incontinence 11/29/2014  . Tardive dyskinesia 09/15/2014  . Chronic constipation 07/03/2014  . GERD (gastroesophageal reflux disease) 05/11/2014  . Depressive disorder   . Hypertension   . Dementia 12/12/2011  . ARTHRITIS 09/20/2009  . Controlled diabetes mellitus type II without complication ([place]) [id number]  . Hyperlipidemia associated with type 2 diabetes mellitus ([place]) 06/06/2006  . Schizophrenia ([place]) 06/06/2006  . Allergic rhinitis 06/06/2006    CBC    Component Value Date/Time   WBC 10.3 08/14/2014 2136   WBC 8.1 10/21/2013   RBC 4.56 08/14/2014 2136   HGB 12.0 08/14/2014 2136   HCT 39.5 08/14/2014 2136   PLT 268 08/14/2014 2136   MCV 86.6  08/14/2014 2136   LYMPHSABS 2.8 08/14/2014 2136   MONOABS 0.6 08/14/2014 2136   EOSABS 0.1 08/14/2014 2136   BASOSABS 0.0 08/14/2014 2136    CMP     Component Value Date/Time   NA 139 08/14/2014 2136   NA 140 05/21/2014   K 4.6 08/14/2014 2136   CL 102 08/14/2014 2136  CO2 31 08/14/2014 2136   GLUCOSE 125* 08/14/2014 2136   BUN 18 08/14/2014 2136   BUN 14 05/21/2014   CREATININE 0.76 08/14/2014 2136   CREATININE 0.8 05/21/2014   CREATININE 1.00 08/31/2011 1124   CALCIUM 9.1 08/14/2014 2136   PROT 7.2 08/14/2014 2136   ALBUMIN 3.6 08/14/2014 2136   AST 24 08/14/2014 2136   ALT 19 08/14/2014 2136   ALKPHOS 85 08/14/2014 2136   BILITOT 0.5 08/14/2014 2136   GFRNONAA >60 08/14/2014 2136   GFRAA >60 08/14/2014 2136    Assessment and Plan  Allergic rhinitis Controlled; continue chronic zyrtec 10 mg daily  GERD (gastroesophageal reflux disease) Chronic and stable; cont prilosec 20 mg BID  Chronic constipation Stable on colace daily ;cont colace and monitor DX    Hennie Duos, MD

## 2015-06-07 DIAGNOSIS — F0391 Unspecified dementia with behavioral disturbance: Secondary | ICD-10-CM | POA: Diagnosis not present

## 2015-06-07 DIAGNOSIS — R1311 Dysphagia, oral phase: Secondary | ICD-10-CM | POA: Diagnosis not present

## 2015-06-08 DIAGNOSIS — F0391 Unspecified dementia with behavioral disturbance: Secondary | ICD-10-CM | POA: Diagnosis not present

## 2015-06-08 DIAGNOSIS — R1311 Dysphagia, oral phase: Secondary | ICD-10-CM | POA: Diagnosis not present

## 2015-06-09 DIAGNOSIS — R1311 Dysphagia, oral phase: Secondary | ICD-10-CM | POA: Diagnosis not present

## 2015-06-09 DIAGNOSIS — F0391 Unspecified dementia with behavioral disturbance: Secondary | ICD-10-CM | POA: Diagnosis not present

## 2015-06-10 DIAGNOSIS — F0391 Unspecified dementia with behavioral disturbance: Secondary | ICD-10-CM | POA: Diagnosis not present

## 2015-06-10 DIAGNOSIS — R1311 Dysphagia, oral phase: Secondary | ICD-10-CM | POA: Diagnosis not present

## 2015-06-12 ENCOUNTER — Encounter: Payer: Self-pay | Admitting: Internal Medicine

## 2015-06-12 NOTE — Assessment & Plan Note (Signed)
Chronic and stable; cont prilosec 20 mg BID

## 2015-06-12 NOTE — Assessment & Plan Note (Signed)
Stable on colace daily ;cont colace and monitor DX

## 2015-06-12 NOTE — Assessment & Plan Note (Signed)
Controlled; continue chronic zyrtec 10 mg daily

## 2015-06-13 DIAGNOSIS — F0391 Unspecified dementia with behavioral disturbance: Secondary | ICD-10-CM | POA: Diagnosis not present

## 2015-06-13 DIAGNOSIS — R1311 Dysphagia, oral phase: Secondary | ICD-10-CM | POA: Diagnosis not present

## 2015-06-14 DIAGNOSIS — R1311 Dysphagia, oral phase: Secondary | ICD-10-CM | POA: Diagnosis not present

## 2015-06-14 DIAGNOSIS — F0391 Unspecified dementia with behavioral disturbance: Secondary | ICD-10-CM | POA: Diagnosis not present

## 2015-06-15 DIAGNOSIS — F0391 Unspecified dementia with behavioral disturbance: Secondary | ICD-10-CM | POA: Diagnosis not present

## 2015-06-15 DIAGNOSIS — R1311 Dysphagia, oral phase: Secondary | ICD-10-CM | POA: Diagnosis not present

## 2015-06-16 DIAGNOSIS — R1311 Dysphagia, oral phase: Secondary | ICD-10-CM | POA: Diagnosis not present

## 2015-06-16 DIAGNOSIS — F0391 Unspecified dementia with behavioral disturbance: Secondary | ICD-10-CM | POA: Diagnosis not present

## 2015-06-30 DIAGNOSIS — F0391 Unspecified dementia with behavioral disturbance: Secondary | ICD-10-CM | POA: Diagnosis not present

## 2015-06-30 DIAGNOSIS — F329 Major depressive disorder, single episode, unspecified: Secondary | ICD-10-CM | POA: Diagnosis not present

## 2015-06-30 DIAGNOSIS — F259 Schizoaffective disorder, unspecified: Secondary | ICD-10-CM | POA: Diagnosis not present

## 2015-07-11 ENCOUNTER — Non-Acute Institutional Stay (SKILLED_NURSING_FACILITY): Payer: Medicare Other | Admitting: Adult Health

## 2015-07-11 ENCOUNTER — Encounter: Payer: Self-pay | Admitting: Adult Health

## 2015-07-11 DIAGNOSIS — K5900 Constipation, unspecified: Secondary | ICD-10-CM | POA: Diagnosis not present

## 2015-07-11 DIAGNOSIS — E785 Hyperlipidemia, unspecified: Secondary | ICD-10-CM

## 2015-07-11 DIAGNOSIS — G2401 Drug induced subacute dyskinesia: Secondary | ICD-10-CM

## 2015-07-11 DIAGNOSIS — E1169 Type 2 diabetes mellitus with other specified complication: Secondary | ICD-10-CM

## 2015-07-11 DIAGNOSIS — Z794 Long term (current) use of insulin: Secondary | ICD-10-CM

## 2015-07-11 DIAGNOSIS — F209 Schizophrenia, unspecified: Secondary | ICD-10-CM | POA: Diagnosis not present

## 2015-07-11 DIAGNOSIS — E119 Type 2 diabetes mellitus without complications: Secondary | ICD-10-CM

## 2015-07-11 DIAGNOSIS — K5909 Other constipation: Secondary | ICD-10-CM

## 2015-07-11 DIAGNOSIS — I10 Essential (primary) hypertension: Secondary | ICD-10-CM

## 2015-07-11 DIAGNOSIS — F0390 Unspecified dementia without behavioral disturbance: Secondary | ICD-10-CM | POA: Diagnosis not present

## 2015-07-11 DIAGNOSIS — K219 Gastro-esophageal reflux disease without esophagitis: Secondary | ICD-10-CM | POA: Diagnosis not present

## 2015-07-12 DIAGNOSIS — E119 Type 2 diabetes mellitus without complications: Secondary | ICD-10-CM | POA: Diagnosis not present

## 2015-07-12 DIAGNOSIS — E785 Hyperlipidemia, unspecified: Secondary | ICD-10-CM | POA: Diagnosis not present

## 2015-07-12 LAB — LIPID PANEL
CHOLESTEROL: 135 mg/dL (ref 0–200)
HDL: 45 mg/dL (ref 35–70)
TRIGLYCERIDES: 130 mg/dL (ref 40–160)

## 2015-07-12 LAB — HEPATIC FUNCTION PANEL
ALT: 11 U/L (ref 7–35)
AST: 16 U/L (ref 13–35)
Alkaline Phosphatase: 83 U/L (ref 25–125)
Bilirubin, Total: 0.2 mg/dL

## 2015-07-12 LAB — BASIC METABOLIC PANEL
BUN: 13 mg/dL (ref 4–21)
CREATININE: 0.7 mg/dL (ref <=1.1)
Glucose: 94 mg/dL
Potassium: 4.4 mmol/L (ref 3.4–5.3)
SODIUM: 141 mmol/L (ref 137–147)

## 2015-07-12 LAB — CBC AND DIFFERENTIAL
HEMATOCRIT: 38 % (ref 36–46)
Hemoglobin: 12.7 g/dL (ref 12.0–16.0)
Platelets: 229 10*3/uL (ref 150–399)
WBC: 8.2 10^3/mL

## 2015-07-13 ENCOUNTER — Ambulatory Visit (INDEPENDENT_AMBULATORY_CARE_PROVIDER_SITE_OTHER): Payer: Medicare Other | Admitting: Podiatry

## 2015-07-13 ENCOUNTER — Encounter: Payer: Self-pay | Admitting: Podiatry

## 2015-07-13 DIAGNOSIS — M79674 Pain in right toe(s): Secondary | ICD-10-CM

## 2015-07-13 DIAGNOSIS — B351 Tinea unguium: Secondary | ICD-10-CM

## 2015-07-13 DIAGNOSIS — M79675 Pain in left toe(s): Secondary | ICD-10-CM | POA: Diagnosis not present

## 2015-07-13 NOTE — Patient Instructions (Signed)
Continue wear socks and slippers on the right and left feet Apply skin lotion daily to the right and left feet Wear tubefoam  over right great toe joint daily  Gean Birchwood DPM [facility]   Diabetes and Foot Care Diabetes may cause you to have problems because of poor blood supply (circulation) to your feet and legs. This may cause the skin on your feet to become thinner, break easier, and heal more slowly. Your skin may become dry, and the skin may peel and crack. You may also have nerve damage in your legs and feet causing decreased feeling in them. You may not notice minor injuries to your feet that could lead to infections or more serious problems. Taking care of your feet is one of the most important things you can do for yourself.  HOME CARE INSTRUCTIONS  Wear shoes at all times, even in the house. Do not go barefoot. Bare feet are easily injured.  Check your feet daily for blisters, cuts, and redness. If you cannot see the bottom of your feet, use a mirror or ask someone for help.  Wash your feet with warm water (do not use hot water) and mild soap. Then pat your feet and the areas between your toes until they are completely dry. Do not soak your feet as this can dry your skin.  Apply a moisturizing lotion or petroleum jelly (that does not contain alcohol and is unscented) to the skin on your feet and to dry, brittle toenails. Do not apply lotion between your toes.  Trim your toenails straight across. Do not dig under them or around the cuticle. File the edges of your nails with an emery board or nail file.  Do not cut corns or calluses or try to remove them with medicine.  Wear clean socks or stockings every day. Make sure they are not too tight. Do not wear knee-high stockings since they may decrease blood flow to your legs.  Wear shoes that fit properly and have enough cushioning. To break in new shoes, wear them for just a few hours a day. This prevents you from  injuring your feet. Always look in your shoes before you put them on to be sure there are no objects inside.  Do not cross your legs. This may decrease the blood flow to your feet.  If you find a minor scrape, cut, or break in the skin on your feet, keep it and the skin around it clean and dry. These areas may be cleansed with mild soap and water. Do not cleanse the area with peroxide, alcohol, or iodine.  When you remove an adhesive bandage, be sure not to damage the skin around it.  If you have a wound, look at it several times a day to make sure it is healing.  Do not use heating pads or hot water bottles. They may burn your skin. If you have lost feeling in your feet or legs, you may not know it is happening until it is too late.  Make sure your health care provider performs a complete foot exam at least annually or more often if you have foot problems. Report any cuts, sores, or bruises to your health care provider immediately. SEEK MEDICAL CARE IF:   You have an injury that is not healing.  You have cuts or breaks in the skin.  You have an ingrown nail.  You notice redness on your legs or feet.  You feel burning or tingling  in your legs or feet.  You have pain or cramps in your legs and feet.  Your legs or feet are numb.  Your feet always feel cold. SEEK IMMEDIATE MEDICAL CARE IF:   There is increasing redness, swelling, or pain in or around a wound.  There is a red line that goes up your leg.  Pus is coming from a wound.  You develop a fever or as directed by your health care provider.  You notice a bad smell coming from an ulcer or wound.   This information is not intended to replace advice given to you by your health care provider. Make sure you discuss any questions you have with your health care provider.   Document Released: 03/23/2000 Document Revised: 11/26/2012 Document Reviewed: 09/02/2012 Elsevier Interactive Patient Education [employer].

## 2015-07-14 NOTE — Progress Notes (Signed)
Patient ID: Debra Tran, female   DOB: 01-27-1928, 82 y.o.   MRN: [id number]  Subjective: This patient presents today with her daughter present to treatment room and is requesting debridement of her mother's thickened and elongated toenails. Patient has history of friction lesion right first MPJ overlying bunion deformity that had a history of skin breakdown the area associated with diabetic shoes. Patient no longer wears a diabetic shoes and wears slippers or socks only  Objective: Patient has difficulty responding to questions  Vascular: DP and PT pulses 1/4 bilaterally Capillary reflex delayed bilaterally  Dermatological: No open skin lesions bilaterally Eschar dorsal right hallux and distal left toe HAV right without open lesions overlying the bunion The toenails are elongated, brittle, deformed and tender when palpated 6-10  Dry skin bilaterally The skin and skeletal: HAV deformity right with bunion  Assessment: Symptomatic onychomycoses 6-10 HAV right  Plan: Debridement toenails 6-10 mechanically an electrical without any bleeding Continue wearing socks or slippers Okay to place tubefoam over right first MPJ and a tube foam was dispensed today Apply skin lotion to feet daily  Reappoint 3 months

## 2015-07-18 ENCOUNTER — Non-Acute Institutional Stay (SKILLED_NURSING_FACILITY): Payer: Medicare Other | Admitting: Internal Medicine

## 2015-07-18 ENCOUNTER — Encounter: Payer: Self-pay | Admitting: Internal Medicine

## 2015-07-18 DIAGNOSIS — F259 Schizoaffective disorder, unspecified: Secondary | ICD-10-CM | POA: Diagnosis not present

## 2015-07-18 DIAGNOSIS — H109 Unspecified conjunctivitis: Secondary | ICD-10-CM | POA: Diagnosis not present

## 2015-07-18 DIAGNOSIS — C4490 Unspecified malignant neoplasm of skin, unspecified: Secondary | ICD-10-CM | POA: Diagnosis not present

## 2015-07-18 DIAGNOSIS — F329 Major depressive disorder, single episode, unspecified: Secondary | ICD-10-CM | POA: Diagnosis not present

## 2015-07-18 DIAGNOSIS — F0391 Unspecified dementia with behavioral disturbance: Secondary | ICD-10-CM | POA: Diagnosis not present

## 2015-07-18 NOTE — Assessment & Plan Note (Signed)
a search through the chart yeilded a note on a note pad paper that she was seen 08/03/2014 for pre -Ca and low risk skin CA not melanoma by [facility] by Dr Marge Duncans and she was to return if growth/blood/bother pt which has occurred; I have wriiten a consult for pt to be set up

## 2015-07-18 NOTE — Progress Notes (Signed)
-MRN: [id number] Name: Debra Tran  Sex: female Age: 82 y.o. DOB: July 21, 1927  [place] #:  Facility/Room:[facility] Level Of Care: SNF Provider: Inocencio Homes MD Emergency Contacts: Extended Emergency Contact Information Primary Emergency Contact: Petitfrere,[name] Address: [address]          [place], [place] [address] [name] of [place] Phone: [phone] Relation: Daughter  Code Status:   Allergies: Ether  Chief Complaint  Patient presents with  . Acute Visit    HPI: Patient is 82 y.o. female who the psych nurse asked me to see. She has some places on her face that psych is not sure whether they are psychogenic, in which case she will inc rease her zyprexa, or something else. She wonders if they could be zoster. Pt admits that they itch but don't hurt. The nurses don't know her but [place] who does says those places have been on her face for a while on the R, the one on the left is new.    Past Medical History  Diagnosis Date  . Phobia   . Macular degeneration   . Narcissism   . Schizophrenia, paranoid ([place])   . Fungal dermatitis   . Hyperlipidemia   . Mild cognitive impairment   . Diabetes mellitus type 2 in obese ([place])   . Stress incontinence, female   . Allergic rhinitis   . Normocytic anemia   . Depressive disorder   . Hypertension   . CATARACT 06/06/2006    Qualifier: Diagnosis of  By: Genene Churn MD, Janett Billow      Past Surgical History  Procedure Laterality Date  . Cataract extraction  6 16 2006  . Cryotherapy  2 15 2006    facial AK  . Total abdominal hysterectomy w/ bilateral salpingoophorectomy      due to endometriosis  non malignant  . Hyperplastic   4 20 2006    AK shave biopsy  . Abdominal hysterectomy      due to fibroids  . Intraocular injection  [id number]      Medication List       This list is accurate as of: 07/18/15  9:18 PM.  Always use your most recent med list.               acetaminophen 325 MG tablet  Commonly known as:   TYLENOL  Take 650 mg by mouth every 4 (four) hours as needed.     aspirin EC 81 MG tablet  Take 81 mg by mouth every morning. For heart disease     atorvastatin 20 MG tablet  Commonly known as:  LIPITOR  Take 1 tablet (20 mg total) by mouth at bedtime.     benztropine 1 MG tablet  Commonly known as:  COGENTIN  Take 1 mg by mouth at bedtime.     CALCIUM 600-D 600-400 MG-UNIT tablet  Generic drug:  Calcium Carbonate-Vitamin D  Take 1 tablet by mouth every morning.     cetirizine 10 MG tablet  Commonly known as:  ZYRTEC  Take 10 mg by mouth daily.     cholecalciferol 1000 units tablet  Commonly known as:  VITAMIN D  Take 1,000 Units by mouth daily.     cyanocobalamin 1000 MCG tablet  Take 100 mcg by mouth daily.     docusate sodium 100 MG capsule  Commonly known as:  COLACE  Take 1 capsule (100 mg total) by mouth daily. For constipation     donepezil 10 MG tablet  Commonly  known as:  ARICEPT  Take 10 mg by mouth at bedtime.     escitalopram 10 MG tablet  Commonly known as:  LEXAPRO  Take 20 mg by mouth at bedtime. For depression     glipiZIDE 10 MG tablet  Commonly known as:  GLUCOTROL  Take 5 mg by mouth 2 (two) times daily before a meal.     Hypromellose 0.4 % Soln  Place 1 drop into both eyes 3 (three) times daily.     INSULIN LISPRO [place]  Inject into the skin. Inject 3 units subcutaneous;y BID for DM type 2. Inject 3 units Humalog sq BID with lunch and supper.     insulin lispro 100 UNIT/ML injection  Commonly known as:  HUMALOG  Inject 0.03 mLs (3 Units total) into the skin 2 (two) times daily with a meal. Give with lunch and supper for cbg >=200     LEVEMIR FLEXPEN 100 UNIT/ML Pen  Generic drug:  Insulin Detemir  Inject 5 Units into the skin at bedtime.     multivitamin with minerals Tabs tablet  Take 1 tablet by mouth every morning.     NAMENDA XR 28 MG Cp24 24 hr capsule  Generic drug:  memantine  Take 28 mg by mouth daily. For dementia      neomycin-polymyxin b-dexamethasone 3.5-10000-0.1 Oint  Commonly known as:  MAXITROL  Place 1 application into the right eye 3 (three) times daily.     nortriptyline 10 MG capsule  Commonly known as:  PAMELOR  Take 10 mg by mouth at bedtime.     NUTRITIONAL SUPPLEMENT PO  Take by mouth. HSG Puree texture, Nectar consistency     OLANZapine 5 MG tablet  Commonly known as:  ZYPREXA  Take 15 mg by mouth at bedtime.     omeprazole 20 MG capsule  Commonly known as:  PRILOSEC  Take 1 capsule (20 mg total) by mouth 2 (two) times daily.     triamcinolone 0.025 % ointment  Commonly known as:  KENALOG  Apply 1 application topically daily. Apply to arms for skin integrity     ZADITOR OP  Place 1 drop into both eyes 2 (two) times daily.        No orders of the defined types were placed in this encounter.    Immunization History  Administered Date(s) Administered  . Influenza Split 02/15/2011, 01/21/2012  . Influenza Whole 02/05/2007, 01/17/2010, 01/28/2013  . Influenza-Unspecified 06/04/2014, 05/17/2015  . Pneumococcal Polysaccharide-23 04/10/1999  . Td 10/07/2001    Social History  Substance Use Topics  . Smoking status: Former Research scientist (life sciences)  . Smokeless tobacco: Not on file  . Alcohol Use: No    Review of Systems UTO from pt except a few yes or nos, best hx came from [place] as per HPI   Filed Vitals:   07/18/15 1526  BP: 158/53  Pulse: 67  Temp: 97.6 F (36.4 C)  Resp: 20    Physical Exam  GENERAL APPEARANCE: Alert,nmin conversant, No acute distress  SKIN: No diaphoresis round plaques on slt red base crusty and scaley on R forehead and next to R eye, 3 total and One to left of L eye, same look HEENT: B conjunctiva are red and with drainage RESPIRATORY: Breathing is even, unlabored. Lung sounds are clear   CARDIOVASCULAR: Heart RRR no murmurs, rubs or gallops. No peripheral edema  GASTROINTESTINAL: Abdomen is soft, non-tender, not distended w/ normal bowel sounds.   GENITOURINARY: Bladder non tender, not distended  MUSCULOSKELETAL: No abnormal joints or musculature NEUROLOGIC: Cranial nerves 2-12 grossly intact. Moves all extremities PSYCHIATRIC: dementia, no behavioral issues  Patient Active Problem List   Diagnosis Date Noted  . Nonmelanoma skin cancer 07/18/2015  . Essential hypertension, benign 02/24/2015  . Stress incontinence 11/29/2014  . Tardive dyskinesia 09/15/2014  . Chronic constipation 07/03/2014  . GERD (gastroesophageal reflux disease) 05/11/2014  . Depressive disorder   . Hypertension   . Dementia 12/12/2011  . ARTHRITIS 09/20/2009  . Controlled diabetes mellitus type II without complication ([place]) [id number]  . Hyperlipidemia associated with type 2 diabetes mellitus ([place]) 06/06/2006  . Schizophrenia ([place]) 06/06/2006  . Allergic rhinitis 06/06/2006    CBC    Component Value Date/Time   WBC 8.2 07/12/2015   WBC 10.3 08/14/2014 2136   RBC 4.56 08/14/2014 2136   HGB 12.7 07/12/2015   HCT 38 07/12/2015   PLT 229 07/12/2015   MCV 86.6 08/14/2014 2136   LYMPHSABS 2.8 08/14/2014 2136   MONOABS 0.6 08/14/2014 2136   EOSABS 0.1 08/14/2014 2136   BASOSABS 0.0 08/14/2014 2136    CMP     Component Value Date/Time   NA 141 07/12/2015   NA 139 08/14/2014 2136   K 4.4 07/12/2015   CL 102 08/14/2014 2136   CO2 31 08/14/2014 2136   GLUCOSE 125* 08/14/2014 2136   BUN 13 07/12/2015   BUN 18 08/14/2014 2136   CREATININE 0.7 07/12/2015   CREATININE 0.76 08/14/2014 2136   CREATININE 1.00 08/31/2011 1124   CALCIUM 9.1 08/14/2014 2136   PROT 7.2 08/14/2014 2136   ALBUMIN 3.6 08/14/2014 2136   AST 16 07/12/2015   ALT 11 07/12/2015   ALKPHOS 83 07/12/2015   BILITOT 0.5 08/14/2014 2136   GFRNONAA >60 08/14/2014 2136   GFRAA >60 08/14/2014 2136    Assessment and Plan  Nonmelanoma skin cancer a search through the chart yeilded a note on a note pad paper that she was seen 08/03/2014 for pre -Ca and low risk skin CA not  melanoma by [facility] by Dr Marge Duncans and she was to return if growth/blood/bother pt which has occurred; I have wriiten a consult for pt to be set up  B CONJUNCTIVITIS - ordered garamycin drops QID b eyes for 10 days  Time spent > 25 min;> 50% of time with patient was spent reviewing records, labs, tests and studies, counseling and developing plan of care  Inocencio Homes, MD

## 2015-07-29 NOTE — Progress Notes (Signed)
Patient ID: Debra Tran, female   DOB: 01/06/1928, 82 y.o.   MRN: [id number]    Facility:  [facility]       Allergies  Allergen Reactions  . Ether Nausea And Vomiting    Chief Complaint  Patient presents with  . Medical Management of Chronic Issues    Follow up    HPI:  She is a long term resident of this facility being seen for the management of her chronic illnesses. Overall there is little change in her status. She is not voicing any concerns or complaints at this time. There are no nursing conerns at this time.   Past Medical History  Diagnosis Date  . Phobia   . Macular degeneration   . Narcissism   . Schizophrenia, paranoid ([place])   . Fungal dermatitis   . Hyperlipidemia   . Mild cognitive impairment   . Diabetes mellitus type 2 in obese ([place])   . Stress incontinence, female   . Allergic rhinitis   . Normocytic anemia   . Depressive disorder   . Hypertension   . CATARACT 06/06/2006    Qualifier: Diagnosis of  By: Genene Churn MD, Janett Billow      Past Surgical History  Procedure Laterality Date  . Cataract extraction  6 16 2006  . Cryotherapy  2 15 2006    facial AK  . Total abdominal hysterectomy w/ bilateral salpingoophorectomy      due to endometriosis  non malignant  . Hyperplastic   4 20 2006    AK shave biopsy  . Abdominal hysterectomy      due to fibroids  . Intraocular injection  [id number]    VITAL SIGNS BP 133/89 mmHg  Pulse 65  Temp(Src) 97.4 F (36.3 C) (Oral)  Resp 18  Ht 4\' 9"  (1.448 m)  Wt 168 lb (76.204 kg)  BMI 36.34 kg/m2  SpO2 97%  Patient's Medications  New Prescriptions   No medications on file  Previous Medications   ACETAMINOPHEN (TYLENOL) 325 MG TABLET    Take 650 mg by mouth every 4 (four) hours as needed.   ASPIRIN EC 81 MG TABLET    Take 81 mg by mouth every morning. For heart disease   ATORVASTATIN (LIPITOR) 20 MG TABLET    Take 1 tablet (20 mg total) by mouth at bedtime.   BENZTROPINE (COGENTIN) 1 MG TABLET    Take 1  mg by mouth at bedtime.   CALCIUM CARBONATE-VITAMIN D (CALCIUM 600-D) 600-400 MG-UNIT PER TABLET    Take 1 tablet by mouth every morning.    CETIRIZINE (ZYRTEC) 10 MG TABLET    Take 10 mg by mouth daily.   CHOLECALCIFEROL (VITAMIN D) 1000 UNITS TABLET    Take 1,000 Units by mouth daily.   CYANOCOBALAMIN 1000 MCG TABLET    Take 100 mcg by mouth daily.   DOCUSATE SODIUM (COLACE) 100 MG CAPSULE    Take 1 capsule (100 mg total) by mouth daily. For constipation   DONEPEZIL (ARICEPT) 10 MG TABLET    Take 10 mg by mouth at bedtime.   ESCITALOPRAM (LEXAPRO) 10 MG TABLET    Take 20 mg by mouth at bedtime. For depression   GLIPIZIDE (GLUCOTROL) 10 MG TABLET    Take 5 mg by mouth 2 (two) times daily before a meal.    HYPROMELLOSE 0.4 % SOLN    Place 1 drop into both eyes 3 (three) times daily.   INSULIN DETEMIR (LEVEMIR FLEXPEN) 100 UNIT/ML PEN  Inject 5 Units into the skin at bedtime.   INSULIN LISPRO (HUMALOG) 100 UNIT/ML INJECTION    Inject 0.03 mLs (3 Units total) into the skin 2 (two) times daily with a meal. Give with lunch and supper for cbg >=200   INSULIN LISPRO [place]    Inject into the skin. Inject 3 units subcutaneous;y BID for DM type 2. Inject 3 units Humalog sq BID with lunch and supper.   KETOTIFEN FUMARATE (ZADITOR OP)    Place 1 drop into both eyes 2 (two) times daily.   MEMANTINE HCL ER (NAMENDA XR) 28 MG CP24    Take 28 mg by mouth daily. For dementia   MULTIPLE VITAMIN (MULTIVITAMIN WITH MINERALS) TABS    Take 1 tablet by mouth every morning.   NEOMYCIN-POLYMYXIN B-DEXAMETHASONE (MAXITROL) 3.5-10000-0.1 OINT    Place 1 application into the right eye 3 (three) times daily.   NORTRIPTYLINE (PAMELOR) 10 MG CAPSULE    Take 10 mg by mouth at bedtime.   NUTRITIONAL SUPPLEMENTS (NUTRITIONAL SUPPLEMENT PO)    Take by mouth. HSG Puree texture, Nectar consistency   OLANZAPINE (ZYPREXA) 5 MG TABLET    Take 15 mg by mouth at bedtime.   OMEPRAZOLE (PRILOSEC) 20 MG CAPSULE    Take 1 capsule (20 mg  total) by mouth 2 (two) times daily.   TRIAMCINOLONE (KENALOG) 0.025 % OINTMENT    Apply 1 application topically daily. Apply to arms for skin integrity  Modified Medications   No medications on file  Discontinued Medications     SIGNIFICANT DIAGNOSTIC EXAMS   03-07-14: chest x-ray: right lower lobe mass (2.5 cm)   03-19-14: ct of chest: No parenchymal mass is noted within the lungs. No acute abnormality Noted.  08-19-14: mammogram: scattered fibroglandular tissue   12-30-14: right eye intraocular injection    LABS REVIEWED:  08-11-14: wbc 7.1; hgb 11.8; hct 39.4; mcv 85.7; plt 240; tsh 3.23; vit b12: 246; folate 18.1; vit d 24.21  09-09-14: glucose 137; bun 12.4; creat 0.74; k+3.9; na++142; liver normal albumin 3.5; hgb a1c 6.7  01-10-15: glucose 200; bun 14.0; creat 0.87; k+ 4.2; na++ 143; liver normal albumin 3.5; hgb a1c 6.7  Chol 147; ldl 54; trig 248; hdl 44  02-25-15: urine micro-albumin <1.2      Review of Systems Constitutional: Negative for malaise/fatigue.  Respiratory: Negative for cough and shortness of breath.   Cardiovascular: Negative for chest pain, palpitations and leg swelling.  Gastrointestinal: Negative for heartburn, abdominal pain and constipation.  Musculoskeletal: Negative for myalgias and joint pain.  Skin: Negative.   Neurological: Negative for headaches.  Psychiatric/Behavioral: The patient is not nervous/anxious.       Physical Exam Constitutional: No distress.  Overweight   Neck: Neck supple. No JVD present. No thyromegaly present.  Cardiovascular: Normal rate, regular rhythm and intact distal pulses.   Respiratory: Effort normal and breath sounds normal. No respiratory distress.  GI: Soft. Bowel sounds are normal. She exhibits no distension. There is no tenderness.  Musculoskeletal: She exhibits no edema.  Is able to move all extremities Has very little abnormal tongue movement  Neurological: She is alert.  Skin: Skin is warm and dry.  She is not diaphoretic.       ASSESSMENT/ PLAN:  1. Dementia without behavior disturbance; is without change will continue aricept 10 mg daily and namenda xr 28 mg daily will not make changes will monitor   Her current weight is 173 pounds.   2. Tremor: she does not have  a resting tremor present she does have abnormal tongue movement and lip tremor. Will continue cogentin 0.5 mg nightly   3. Dyslipidemia: will continue lipitor 20 mg daily ldl is 54  4. Diabetes  Will continue lantus  5 units; will continue humalog 3 units with lunch and supper for cbg >=200; will continue glipizide 5 mg twice daily her hgb a1c is 6.7.  and will monitor her status.   5. [name]: will continue prilosec 20 mg twice   daily   6. Constipation: will continue colace daily    7. Depression: will continue lexapro 20 mg nightly and pamelor 10 mg nightly and will monitor   8. Schizophrenia: is stable will continue zyprexa 15 mg nightly and will monitor   Is followed by psych services.  Will continue cogentin 0.5 mg nightly for tardive dyskinesia and will monitor  She requires this current dose of zyprexa at this time; as she is emotionally stable; lower her dose could adversely affect her quality of life and the benefits of this medication outweigh the risks.   9. Macular degeneration: will contine  maxitrol to right eye three times daily and zaditor to both eyes twice daily    Will check cbc; cmp; lipids; hgb a1c     Ok Edwards NP [facility]  Contact [phone] Monday through Friday 8am- 5pm  After hours call [phone]

## 2015-08-08 ENCOUNTER — Encounter: Payer: Self-pay | Admitting: Internal Medicine

## 2015-08-08 ENCOUNTER — Non-Acute Institutional Stay (SKILLED_NURSING_FACILITY): Payer: Medicare Other | Admitting: Internal Medicine

## 2015-08-08 DIAGNOSIS — Z794 Long term (current) use of insulin: Secondary | ICD-10-CM | POA: Diagnosis not present

## 2015-08-08 DIAGNOSIS — E119 Type 2 diabetes mellitus without complications: Secondary | ICD-10-CM | POA: Diagnosis not present

## 2015-08-08 DIAGNOSIS — F259 Schizoaffective disorder, unspecified: Secondary | ICD-10-CM | POA: Diagnosis not present

## 2015-08-08 DIAGNOSIS — F0390 Unspecified dementia without behavioral disturbance: Secondary | ICD-10-CM | POA: Diagnosis not present

## 2015-08-08 DIAGNOSIS — F209 Schizophrenia, unspecified: Secondary | ICD-10-CM

## 2015-08-08 NOTE — Progress Notes (Signed)
MRN: [id number] Name: Debra Tran  Sex: female Age: 82 y.o. DOB: 1927/07/23  [place] #: Karren Burly Facility/Room:225 Level Of Care: SNF Provider: Inocencio Homes MD  Emergency Contacts: Extended Emergency Contact Information Primary Emergency Contact: Zuch,[name] Address: [address]          [place], [place] [address] [name] of [place] Phone: [phone] Relation: Daughter  Code Status:   Allergies: Ether  Chief Complaint  Patient presents with  . Medical Management of Chronic Issues    HPI: Patient is 82 y.o. female who is being seen for routine issues of dementia, schizophrenia and DM2.  Past Medical History  Diagnosis Date  . Phobia   . Macular degeneration   . Narcissism   . Schizophrenia, paranoid ([place])   . Fungal dermatitis   . Hyperlipidemia   . Mild cognitive impairment   . Diabetes mellitus type 2 in obese ([place])   . Stress incontinence, female   . Allergic rhinitis   . Normocytic anemia   . Depressive disorder   . Hypertension   . CATARACT 06/06/2006    Qualifier: Diagnosis of  By: Genene Churn MD, Janett Billow      Past Surgical History  Procedure Laterality Date  . Cataract extraction  6 16 2006  . Cryotherapy  2 15 2006    facial AK  . Total abdominal hysterectomy w/ bilateral salpingoophorectomy      due to endometriosis  non malignant  . Hyperplastic   4 20 2006    AK shave biopsy  . Abdominal hysterectomy      due to fibroids  . Intraocular injection  [id number]      Medication List       This list is accurate as of: 08/08/15 11:59 PM.  Always use your most recent med list.               acetaminophen 325 MG tablet  Commonly known as:  TYLENOL  Take 650 mg by mouth every 4 (four) hours as needed.     aspirin EC 81 MG tablet  Take 81 mg by mouth every morning. For heart disease     atorvastatin 20 MG tablet  Commonly known as:  LIPITOR  Take 1 tablet (20 mg total) by mouth at bedtime.     benztropine 1 MG tablet  Commonly  known as:  COGENTIN  Take 1 mg by mouth at bedtime.     CALCIUM 600-D 600-400 MG-UNIT tablet  Generic drug:  Calcium Carbonate-Vitamin D  Take 1 tablet by mouth every morning.     cetirizine 10 MG tablet  Commonly known as:  ZYRTEC  Take 10 mg by mouth daily.     cholecalciferol 1000 units tablet  Commonly known as:  VITAMIN D  Take 1,000 Units by mouth daily.     cyanocobalamin 1000 MCG tablet  Take 100 mcg by mouth daily.     docusate sodium 100 MG capsule  Commonly known as:  COLACE  Take 1 capsule (100 mg total) by mouth daily. For constipation     donepezil 10 MG tablet  Commonly known as:  ARICEPT  Take 10 mg by mouth at bedtime.     escitalopram 10 MG tablet  Commonly known as:  LEXAPRO  Take 20 mg by mouth at bedtime. For depression     glipiZIDE 10 MG tablet  Commonly known as:  GLUCOTROL  Take 5 mg by mouth 2 (two) times daily before a meal.     Hypromellose  0.4 % Soln  Place 1 drop into both eyes 3 (three) times daily.     INSULIN LISPRO [place]  Inject into the skin. Inject 3 units subcutaneous;y BID for DM type 2. Inject 3 units Humalog sq BID with lunch and supper.     insulin lispro 100 UNIT/ML injection  Commonly known as:  HUMALOG  Inject 0.03 mLs (3 Units total) into the skin 2 (two) times daily with a meal. Give with lunch and supper for cbg >=200     LEVEMIR FLEXPEN 100 UNIT/ML Pen  Generic drug:  Insulin Detemir  Inject 5 Units into the skin at bedtime.     multivitamin with minerals Tabs tablet  Take 1 tablet by mouth every morning.     NAMENDA XR 28 MG Cp24 24 hr capsule  Generic drug:  memantine  Take 28 mg by mouth daily. For dementia     neomycin-polymyxin b-dexamethasone 3.5-10000-0.1 Oint  Commonly known as:  MAXITROL  Place 1 application into the right eye 3 (three) times daily.     nortriptyline 10 MG capsule  Commonly known as:  PAMELOR  Take 10 mg by mouth at bedtime.     NUTRITIONAL SUPPLEMENT PO  Take by mouth. HSG Puree  texture, Nectar consistency     OLANZapine 5 MG tablet  Commonly known as:  ZYPREXA  Take 15 mg by mouth at bedtime.     omeprazole 20 MG capsule  Commonly known as:  PRILOSEC  Take 1 capsule (20 mg total) by mouth 2 (two) times daily.     triamcinolone 0.025 % ointment  Commonly known as:  KENALOG  Apply 1 application topically daily. Apply to arms for skin integrity     ZADITOR OP  Place 1 drop into both eyes 2 (two) times daily.        No orders of the defined types were placed in this encounter.    Immunization History  Administered Date(s) Administered  . Influenza Split 02/15/2011, 01/21/2012  . Influenza Whole 02/05/2007, 01/17/2010, 01/28/2013  . Influenza-Unspecified 06/04/2014, 05/17/2015  . Pneumococcal Polysaccharide-23 04/10/1999  . Td 10/07/2001    Social History  Substance Use Topics  . Smoking status: Former Research scientist (life sciences)  . Smokeless tobacco: Not on file  . Alcohol Use: No    Review of Systems  DATA OBTAINED: from patient, nurse GENERAL:  no fevers, fatigue, appetite changes SKIN: No itching, rash HEENT: No complaint RESPIRATORY: No cough, wheezing, SOB CARDIAC: No chest pain, palpitations, lower extremity edema  GI: No abdominal pain, No N/V/D or constipation, No heartburn or reflux  GU: No dysuria, frequency or urgency, or incontinence  MUSCULOSKELETAL: No unrelieved bone/joint pain NEUROLOGIC: No headache, dizziness  PSYCHIATRIC: No overt anxiety or sadness  Filed Vitals:   08/08/15 1524  BP: 124/55  Pulse: 63  Temp: 97.8 F (36.6 C)  Resp: 17    Physical Exam  GENERAL APPEARANCE: Alert, conversant,obese WF No acute distress  SKIN: No diaphoresis rash, or wounds HEENT: Unremarkable RESPIRATORY: Breathing is even, unlabored. Lung sounds are clear   CARDIOVASCULAR: Heart RRR no murmurs, rubs or gallops. No peripheral edema  GASTROINTESTINAL: Abdomen is soft, non-tender, not distended w/ normal bowel sounds.  GENITOURINARY: Bladder non  tender, not distended  MUSCULOSKELETAL: No abnormal joints or musculature NEUROLOGIC: Cranial nerves 2-12 grossly intact. Moves all extremities PSYCHIATRIC: Mood and affect appropriate to situation with dementia, no behavioral issues  Patient Active Problem List   Diagnosis Date Noted  . Nonmelanoma skin cancer 07/18/2015  .  Essential hypertension, benign 02/24/2015  . Stress incontinence 11/29/2014  . Tardive dyskinesia 09/15/2014  . Chronic constipation 07/03/2014  . GERD (gastroesophageal reflux disease) 05/11/2014  . Depressive disorder   . Dementia without behavioral disturbance 12/12/2011  . ARTHRITIS 09/20/2009  . Controlled diabetes mellitus type II without complication ([place]) [id number]  . Hyperlipidemia associated with type 2 diabetes mellitus ([place]) 06/06/2006  . Schizophrenia ([place]) 06/06/2006  . Allergic rhinitis 06/06/2006    CBC    Component Value Date/Time   WBC 8.2 07/12/2015   WBC 10.3 08/14/2014 2136   RBC 4.56 08/14/2014 2136   HGB 12.7 07/12/2015   HCT 38 07/12/2015   PLT 229 07/12/2015   MCV 86.6 08/14/2014 2136   LYMPHSABS 2.8 08/14/2014 2136   MONOABS 0.6 08/14/2014 2136   EOSABS 0.1 08/14/2014 2136   BASOSABS 0.0 08/14/2014 2136    CMP     Component Value Date/Time   NA 141 07/12/2015   NA 139 08/14/2014 2136   K 4.4 07/12/2015   CL 102 08/14/2014 2136   CO2 31 08/14/2014 2136   GLUCOSE 125* 08/14/2014 2136   BUN 13 07/12/2015   BUN 18 08/14/2014 2136   CREATININE 0.7 07/12/2015   CREATININE 0.76 08/14/2014 2136   CREATININE 1.00 08/31/2011 1124   CALCIUM 9.1 08/14/2014 2136   PROT 7.2 08/14/2014 2136   ALBUMIN 3.6 08/14/2014 2136   AST 16 07/12/2015   ALT 11 07/12/2015   ALKPHOS 83 07/12/2015   BILITOT 0.5 08/14/2014 2136   GFRNONAA >60 08/14/2014 2136   GFRAA >60 08/14/2014 2136    Assessment and Plan  Dementia without behavioral disturbance without change will continue aricept 10 mg daily and namenda xr 28 mg daily will not  make changes will monitor Her current weight is 173 pounds.   Schizophrenia stable will continue zyprexa 15 mg nightly and will monitor Is followed by psych services. Will continue cogentin 0.5 mg nightly for tardive dyskinesia and will monitor She requires this current dose of zyprexa at this time; as she is emotionally stable; lower her dose could adversely affect her quality of life and the benefits of this medication outweigh the risks.   Controlled diabetes mellitus type II without complication continue lantus 5 units; will continue humalog 3 units with lunch and supper for cbg >=200; will continue glipizide 5 mg twice daily her hgb a1c is 6.7. and will monitor her status    Inocencio Homes  MD

## 2015-08-14 ENCOUNTER — Encounter: Payer: Self-pay | Admitting: Internal Medicine

## 2015-08-14 NOTE — Assessment & Plan Note (Signed)
stable will continue zyprexa 15 mg nightly and will monitor Is followed by psych services. Will continue cogentin 0.5 mg nightly for tardive dyskinesia and will monitor She requires this current dose of zyprexa at this time; as she is emotionally stable; lower her dose could adversely affect her quality of life and the benefits of this medication outweigh the risks.

## 2015-08-14 NOTE — Assessment & Plan Note (Signed)
without change will continue aricept 10 mg daily and namenda xr 28 mg daily will not make changes will monitor Her current weight is 173 pounds.

## 2015-08-14 NOTE — Assessment & Plan Note (Signed)
continue lantus 5 units; will continue humalog 3 units with lunch and supper for cbg >=200; will continue glipizide 5 mg twice daily her hgb a1c is 6.7. and will monitor her status

## 2015-08-16 DIAGNOSIS — L570 Actinic keratosis: Secondary | ICD-10-CM | POA: Diagnosis not present

## 2015-08-16 DIAGNOSIS — C4492 Squamous cell carcinoma of skin, unspecified: Secondary | ICD-10-CM | POA: Diagnosis not present

## 2015-08-16 DIAGNOSIS — D239 Other benign neoplasm of skin, unspecified: Secondary | ICD-10-CM | POA: Diagnosis not present

## 2015-08-17 ENCOUNTER — Encounter (INDEPENDENT_AMBULATORY_CARE_PROVIDER_SITE_OTHER): Payer: Medicare Other | Admitting: Ophthalmology

## 2015-08-18 ENCOUNTER — Encounter (INDEPENDENT_AMBULATORY_CARE_PROVIDER_SITE_OTHER): Payer: Medicare Other | Admitting: Ophthalmology

## 2015-08-18 DIAGNOSIS — E11311 Type 2 diabetes mellitus with unspecified diabetic retinopathy with macular edema: Secondary | ICD-10-CM | POA: Diagnosis not present

## 2015-08-18 DIAGNOSIS — H43813 Vitreous degeneration, bilateral: Secondary | ICD-10-CM | POA: Diagnosis not present

## 2015-08-18 DIAGNOSIS — E113313 Type 2 diabetes mellitus with moderate nonproliferative diabetic retinopathy with macular edema, bilateral: Secondary | ICD-10-CM | POA: Diagnosis not present

## 2015-08-18 DIAGNOSIS — F0391 Unspecified dementia with behavioral disturbance: Secondary | ICD-10-CM | POA: Diagnosis not present

## 2015-08-18 DIAGNOSIS — H35033 Hypertensive retinopathy, bilateral: Secondary | ICD-10-CM

## 2015-08-18 DIAGNOSIS — R1311 Dysphagia, oral phase: Secondary | ICD-10-CM | POA: Diagnosis not present

## 2015-08-18 DIAGNOSIS — I10 Essential (primary) hypertension: Secondary | ICD-10-CM

## 2015-08-18 DIAGNOSIS — H353231 Exudative age-related macular degeneration, bilateral, with active choroidal neovascularization: Secondary | ICD-10-CM | POA: Diagnosis not present

## 2015-08-19 DIAGNOSIS — F0391 Unspecified dementia with behavioral disturbance: Secondary | ICD-10-CM | POA: Diagnosis not present

## 2015-08-19 DIAGNOSIS — R1311 Dysphagia, oral phase: Secondary | ICD-10-CM | POA: Diagnosis not present

## 2015-08-20 DIAGNOSIS — R1311 Dysphagia, oral phase: Secondary | ICD-10-CM | POA: Diagnosis not present

## 2015-08-20 DIAGNOSIS — F0391 Unspecified dementia with behavioral disturbance: Secondary | ICD-10-CM | POA: Diagnosis not present

## 2015-08-22 DIAGNOSIS — F0391 Unspecified dementia with behavioral disturbance: Secondary | ICD-10-CM | POA: Diagnosis not present

## 2015-08-22 DIAGNOSIS — R1311 Dysphagia, oral phase: Secondary | ICD-10-CM | POA: Diagnosis not present

## 2015-08-23 DIAGNOSIS — R1311 Dysphagia, oral phase: Secondary | ICD-10-CM | POA: Diagnosis not present

## 2015-08-23 DIAGNOSIS — F0391 Unspecified dementia with behavioral disturbance: Secondary | ICD-10-CM | POA: Diagnosis not present

## 2015-08-24 DIAGNOSIS — F0391 Unspecified dementia with behavioral disturbance: Secondary | ICD-10-CM | POA: Diagnosis not present

## 2015-08-24 DIAGNOSIS — R1311 Dysphagia, oral phase: Secondary | ICD-10-CM | POA: Diagnosis not present

## 2015-08-25 DIAGNOSIS — F0391 Unspecified dementia with behavioral disturbance: Secondary | ICD-10-CM | POA: Diagnosis not present

## 2015-08-25 DIAGNOSIS — R1311 Dysphagia, oral phase: Secondary | ICD-10-CM | POA: Diagnosis not present

## 2015-08-26 DIAGNOSIS — R1311 Dysphagia, oral phase: Secondary | ICD-10-CM | POA: Diagnosis not present

## 2015-08-26 DIAGNOSIS — F0391 Unspecified dementia with behavioral disturbance: Secondary | ICD-10-CM | POA: Diagnosis not present

## 2015-08-29 DIAGNOSIS — F0391 Unspecified dementia with behavioral disturbance: Secondary | ICD-10-CM | POA: Diagnosis not present

## 2015-08-29 DIAGNOSIS — R1311 Dysphagia, oral phase: Secondary | ICD-10-CM | POA: Diagnosis not present

## 2015-08-30 DIAGNOSIS — R1311 Dysphagia, oral phase: Secondary | ICD-10-CM | POA: Diagnosis not present

## 2015-08-30 DIAGNOSIS — F0391 Unspecified dementia with behavioral disturbance: Secondary | ICD-10-CM | POA: Diagnosis not present

## 2015-08-31 DIAGNOSIS — F0391 Unspecified dementia with behavioral disturbance: Secondary | ICD-10-CM | POA: Diagnosis not present

## 2015-08-31 DIAGNOSIS — R1311 Dysphagia, oral phase: Secondary | ICD-10-CM | POA: Diagnosis not present

## 2015-09-01 DIAGNOSIS — F0391 Unspecified dementia with behavioral disturbance: Secondary | ICD-10-CM | POA: Diagnosis not present

## 2015-09-01 DIAGNOSIS — R1311 Dysphagia, oral phase: Secondary | ICD-10-CM | POA: Diagnosis not present

## 2015-09-03 DIAGNOSIS — R1311 Dysphagia, oral phase: Secondary | ICD-10-CM | POA: Diagnosis not present

## 2015-09-03 DIAGNOSIS — F0391 Unspecified dementia with behavioral disturbance: Secondary | ICD-10-CM | POA: Diagnosis not present

## 2015-09-04 DIAGNOSIS — F0391 Unspecified dementia with behavioral disturbance: Secondary | ICD-10-CM | POA: Diagnosis not present

## 2015-09-04 DIAGNOSIS — R1311 Dysphagia, oral phase: Secondary | ICD-10-CM | POA: Diagnosis not present

## 2015-09-05 DIAGNOSIS — R1311 Dysphagia, oral phase: Secondary | ICD-10-CM | POA: Diagnosis not present

## 2015-09-05 DIAGNOSIS — F0391 Unspecified dementia with behavioral disturbance: Secondary | ICD-10-CM | POA: Diagnosis not present

## 2015-09-06 DIAGNOSIS — R1311 Dysphagia, oral phase: Secondary | ICD-10-CM | POA: Diagnosis not present

## 2015-09-06 DIAGNOSIS — F0391 Unspecified dementia with behavioral disturbance: Secondary | ICD-10-CM | POA: Diagnosis not present

## 2015-09-07 DIAGNOSIS — R1311 Dysphagia, oral phase: Secondary | ICD-10-CM | POA: Diagnosis not present

## 2015-09-07 DIAGNOSIS — F0391 Unspecified dementia with behavioral disturbance: Secondary | ICD-10-CM | POA: Diagnosis not present

## 2015-09-09 ENCOUNTER — Encounter: Payer: Self-pay | Admitting: Adult Health

## 2015-09-09 ENCOUNTER — Non-Acute Institutional Stay (SKILLED_NURSING_FACILITY): Payer: Medicare Other | Admitting: Adult Health

## 2015-09-09 DIAGNOSIS — E785 Hyperlipidemia, unspecified: Secondary | ICD-10-CM

## 2015-09-09 DIAGNOSIS — E1169 Type 2 diabetes mellitus with other specified complication: Secondary | ICD-10-CM

## 2015-09-09 DIAGNOSIS — K5900 Constipation, unspecified: Secondary | ICD-10-CM | POA: Diagnosis not present

## 2015-09-09 DIAGNOSIS — F209 Schizophrenia, unspecified: Secondary | ICD-10-CM | POA: Diagnosis not present

## 2015-09-09 DIAGNOSIS — I10 Essential (primary) hypertension: Secondary | ICD-10-CM

## 2015-09-09 DIAGNOSIS — K219 Gastro-esophageal reflux disease without esophagitis: Secondary | ICD-10-CM

## 2015-09-09 DIAGNOSIS — F0390 Unspecified dementia without behavioral disturbance: Secondary | ICD-10-CM | POA: Diagnosis not present

## 2015-09-09 DIAGNOSIS — E119 Type 2 diabetes mellitus without complications: Secondary | ICD-10-CM | POA: Diagnosis not present

## 2015-09-09 DIAGNOSIS — G2401 Drug induced subacute dyskinesia: Secondary | ICD-10-CM

## 2015-09-09 DIAGNOSIS — R1311 Dysphagia, oral phase: Secondary | ICD-10-CM | POA: Diagnosis not present

## 2015-09-09 DIAGNOSIS — Z794 Long term (current) use of insulin: Secondary | ICD-10-CM | POA: Diagnosis not present

## 2015-09-09 DIAGNOSIS — K5909 Other constipation: Secondary | ICD-10-CM

## 2015-09-09 DIAGNOSIS — F0391 Unspecified dementia with behavioral disturbance: Secondary | ICD-10-CM | POA: Diagnosis not present

## 2015-09-09 NOTE — Progress Notes (Signed)
Patient ID: Debra Tran, female   DOB: 06/30/1927, 82 y.o.   MRN: [id number]   Provider:   Location: [place] Room Number: [id number] Place of Service:  SNF (31)   PCP: Hennie Duos, MD Patient Care Team: Hennie Duos, MD as PCP - General (Internal Medicine) Hayden Pedro, MD as Attending Physician (Ophthalmology) Gerlene Fee, NP as Nurse Practitioner (Nurse Practitioner) [facility] ([place])  Extended Emergency Contact Information Primary Emergency Contact: Eberlin,[name] Address: [address]          [place], [place] [address] [name] of [place] Phone: [phone] Relation: Daughter  Code Status: Full Code Goals of Care: Advanced Directive information Advanced Directives 08/08/2015  Does patient have an advance directive? No  Type of Advance Directive -  Would patient like information on creating an advanced directive? No - patient declined information      Allergies  Allergen Reactions  . Ether Nausea And Vomiting     Chief Complaint  Patient presents with  . Annual Exam    Annual    HPI: Patient is a 82 y.o. female seen today for an annual comprehensive examination. Overall her status has remained stable over the past year. She has not required any hospitalizations. She continues to get out of bed daily; she does socialize with others on a daily basis. Her family is supportive. She tells me that she is doing well. There are no nursing concerns at this time.    Past Medical History  Diagnosis Date  . Phobia   . Macular degeneration   . Narcissism   . Schizophrenia, paranoid ([place])   . Fungal dermatitis   . Hyperlipidemia   . Mild cognitive impairment   . Diabetes mellitus type 2 in obese ([place])   . Stress incontinence, female   . Allergic rhinitis   . Normocytic anemia   . Depressive disorder   . Hypertension   . CATARACT 06/06/2006    Qualifier: Diagnosis of  By: Genene Churn  MD, Janett Billow     Past Surgical History  Procedure Laterality Date  . Cataract extraction  6 16 2006  . Cryotherapy  2 15 2006    facial AK  . Total abdominal hysterectomy w/ bilateral salpingoophorectomy      due to endometriosis  non malignant  . Hyperplastic   4 20 2006    AK shave biopsy  . Abdominal hysterectomy      due to fibroids  . Intraocular injection  [id number]    reports that she has quit smoking. She does not have any smokeless tobacco history on file. She reports that she does not drink alcohol or use illicit drugs. Social History   Social History  . Marital Status: Divorced    Spouse Name: N/A  . Number of Children: N/A  . Years of Education: N/A   Occupational History  . Not on file.   Social History Main Topics  . Smoking status: Former Research scientist (life sciences)  . Smokeless tobacco: Not on file  . Alcohol Use: No  . Drug Use: No  . Sexual Activity: Not on file   Other Topics Concern  . Not on file   Social History Narrative   Lives in [facility] (assisted living), remotely smoked and chewed tobacco. Daughter([name])  drives her, independent of all ADL's   Family History  Problem Relation Age of Onset  . Heart disease Mother   . Stroke Mother   .  Heart disease Father   . Stroke Father   . Cancer Cousin     liver and colon    Immunization History  Administered Date(s) Administered  . Influenza Split 02/15/2011, 01/21/2012  . Influenza Whole 02/05/2007, 01/17/2010, 01/28/2013  . Influenza-Unspecified 06/04/2014, 05/17/2015  . Pneumococcal Polysaccharide-23 04/10/1999  . Td 10/07/2001     Filed Vitals:   09/09/15 1506  BP: 130/93  Pulse: 58  Temp: 97.7 F (36.5 C)  TempSrc: Oral  Resp: 20  Height: 4\' 9"  (1.448 m)  Weight: 165 lb (74.844 kg)  SpO2: 90%   Body mass index is 35.7 kg/(m^2).    Medication List       This list is accurate as of: 09/09/15  3:16 PM.  Always use your most recent med list.               acetaminophen 325 MG  tablet  Commonly known as:  TYLENOL  Take 650 mg by mouth every 4 (four) hours as needed.     aspirin EC 81 MG tablet  Take 81 mg by mouth every morning. For heart disease     atorvastatin 20 MG tablet  Commonly known as:  LIPITOR  Take 1 tablet (20 mg total) by mouth at bedtime.     benztropine 1 MG tablet  Commonly known as:  COGENTIN  Take 1 mg by mouth at bedtime.     CALCIUM 600-D 600-400 MG-UNIT tablet  Generic drug:  Calcium Carbonate-Vitamin D  Take 1 tablet by mouth every morning.     cetirizine 10 MG tablet  Commonly known as:  ZYRTEC  Take 10 mg by mouth daily.     cholecalciferol 1000 units tablet  Commonly known as:  VITAMIN D  Take 1,000 Units by mouth daily.     cyanocobalamin 1000 MCG tablet  Take 100 mcg by mouth daily.     docusate sodium 100 MG capsule  Commonly known as:  COLACE  Take 1 capsule (100 mg total) by mouth daily. For constipation     donepezil 10 MG tablet  Commonly known as:  ARICEPT  Take 10 mg by mouth at bedtime.     escitalopram 10 MG tablet  Commonly known as:  LEXAPRO  Take 20 mg by mouth at bedtime. For depression     glipiZIDE 10 MG tablet  Commonly known as:  GLUCOTROL  Take 5 mg by mouth 2 (two) times daily before a meal.     Hypromellose 0.4 % Soln  Place 1 drop into both eyes 3 (three) times daily.     INSULIN LISPRO [place]  Inject into the skin. Inject 3 units subcutaneous;y BID for DM type 2. Inject 3 units Humalog sq BID with lunch and supper.     insulin lispro 100 UNIT/ML injection  Commonly known as:  HUMALOG  Inject 0.03 mLs (3 Units total) into the skin 2 (two) times daily with a meal. Give with lunch and supper for cbg >=200     LEVEMIR FLEXPEN 100 UNIT/ML Pen  Generic drug:  Insulin Detemir  Inject 5 Units into the skin at bedtime.     multivitamin with minerals Tabs tablet  Take 1 tablet by mouth every morning.     NAMENDA XR 28 MG Cp24 24 hr capsule  Generic drug:  memantine  Take 28 mg by mouth  daily. For dementia     neomycin-polymyxin b-dexamethasone 3.5-10000-0.1 Oint  Commonly known as:  MAXITROL  Place 1 application into the  right eye 3 (three) times daily.     nortriptyline 10 MG capsule  Commonly known as:  PAMELOR  Take 10 mg by mouth at bedtime.     NUTRITIONAL SUPPLEMENT PO  Take by mouth. HSG Puree texture, Nectar consistency     OLANZapine 5 MG tablet  Commonly known as:  ZYPREXA  Take 10 mg by mouth at bedtime.     omeprazole 20 MG capsule  Commonly known as:  PRILOSEC  Take 1 capsule (20 mg total) by mouth 2 (two) times daily.     triamcinolone 0.025 % ointment  Commonly known as:  KENALOG  Apply 1 application topically daily. Apply to arms for skin integrity     ZADITOR OP  Place 1 drop into both eyes 2 (two) times daily.         SIGNIFICANT DIAGNOSTIC EXAMS  03-07-14: chest x-ray: right lower lobe mass (2.5 cm)   03-19-14: ct of chest: No parenchymal mass is noted within the lungs. No acute abnormality Noted.  08-19-14: mammogram: scattered fibroglandular tissue   12-30-14: right eye intraocular injection    LABS REVIEWED:  01-10-15: glucose 200; bun 14.0; creat 0.87; k+ 4.2; na++ 143; liver normal albumin 3.5; hgb a1c 6.7  Chol 147; ldl 54; trig 248; hdl 44  02-25-15: urine micro-albumin <1.2  07-12-15: wbc 8.2; hgb 12.7; hct 38.1; mcv 84.2; plt 229; glucose 94; bun 13.0; creat 0.72; k+ 4.4; na++ 141; liver normal albumin 3.4; hgb a1c 6.4; chol 135; ldl 64; trig 130; hdl 45      Review of Systems Constitutional: Negative for malaise/fatigue.  Respiratory: Negative for cough and shortness of breath.   Cardiovascular: Negative for chest pain, palpitations and leg swelling.  Gastrointestinal: Negative for heartburn, abdominal pain and constipation.  Musculoskeletal: Negative for myalgias and joint pain.  Skin: Negative.   Neurological: Negative for headaches.  Psychiatric/Behavioral: The patient is not nervous/anxious.        Physical Exam Constitutional: No distress.  Overweight   Neck: Neck supple. No JVD present. No thyromegaly present.  Cardiovascular: Normal rate, regular rhythm and intact distal pulses.   Respiratory: Effort normal and breath sounds normal. No respiratory distress.  GI: Soft. Bowel sounds are normal. She exhibits no distension. There is no tenderness.  Musculoskeletal: She exhibits no edema.  Is able to move all extremities Has very little abnormal tongue movement  Neurological: She is alert.  Skin: Skin is warm and dry. She is not diaphoretic.       ASSESSMENT/ PLAN:  1. Dementia without behavior disturbance; is without change will continue aricept 10 mg daily and namenda xr 28 mg daily will not make changes will monitor   Her current weight is 165 pounds.   2. Tremor: she does not have a resting tremor present she does have abnormal tongue movement and lip tremor. Will continue cogentin 0.5 mg nightly   3. Dyslipidemia: will continue lipitor 20 mg daily ldl is 64  4. Diabetes  Will continue lantus  5 units; will continue humalog 3 units with lunch and supper for cbg >=200; will continue glipizide 5 mg twice daily her hgb a1c is 6.4.  and will monitor her status.   5. [name]: will continue prilosec 20 mg twice daily   6. Constipation: will continue colace daily    7. Depression: will continue lexapro 20 mg nightly and pamelor 10 mg nightly and will monitor   8. Schizophrenia: is stable will continue zyprexa 10 mg nightly and will  monitor   Is followed by psych services.  Will continue cogentin 0.5 mg nightly for tardive dyskinesia and will monitor  She requires this current dose of zyprexa at this time; as she is emotionally stable; lower her dose could adversely affect her quality of life and the benefits of this medication outweigh the risks.   9. Macular degeneration: will contine  maxitrol to right eye three times daily and zaditor to both eyes twice daily    Her  health maintenance is up to date.    Time spent with patient  45   minutes >50% time spent counseling; reviewing medical record; tests; labs; and developing future plan of care     Ok Edwards NP [facility]  Contact [phone] Monday through Friday 8am- 5pm  After hours call [phone]

## 2015-09-12 DIAGNOSIS — R1311 Dysphagia, oral phase: Secondary | ICD-10-CM | POA: Diagnosis not present

## 2015-09-12 DIAGNOSIS — F0391 Unspecified dementia with behavioral disturbance: Secondary | ICD-10-CM | POA: Diagnosis not present

## 2015-09-13 DIAGNOSIS — F0391 Unspecified dementia with behavioral disturbance: Secondary | ICD-10-CM | POA: Diagnosis not present

## 2015-09-13 DIAGNOSIS — R1311 Dysphagia, oral phase: Secondary | ICD-10-CM | POA: Diagnosis not present

## 2015-09-14 DIAGNOSIS — R1311 Dysphagia, oral phase: Secondary | ICD-10-CM | POA: Diagnosis not present

## 2015-09-14 DIAGNOSIS — F0391 Unspecified dementia with behavioral disturbance: Secondary | ICD-10-CM | POA: Diagnosis not present

## 2015-09-15 DIAGNOSIS — R1311 Dysphagia, oral phase: Secondary | ICD-10-CM | POA: Diagnosis not present

## 2015-09-15 DIAGNOSIS — F0391 Unspecified dementia with behavioral disturbance: Secondary | ICD-10-CM | POA: Diagnosis not present

## 2015-09-16 DIAGNOSIS — R1311 Dysphagia, oral phase: Secondary | ICD-10-CM | POA: Diagnosis not present

## 2015-09-16 DIAGNOSIS — F0391 Unspecified dementia with behavioral disturbance: Secondary | ICD-10-CM | POA: Diagnosis not present

## 2015-09-19 DIAGNOSIS — R1311 Dysphagia, oral phase: Secondary | ICD-10-CM | POA: Diagnosis not present

## 2015-09-19 DIAGNOSIS — F0391 Unspecified dementia with behavioral disturbance: Secondary | ICD-10-CM | POA: Diagnosis not present

## 2015-10-12 ENCOUNTER — Encounter: Payer: Self-pay | Admitting: Podiatry

## 2015-10-12 ENCOUNTER — Ambulatory Visit (INDEPENDENT_AMBULATORY_CARE_PROVIDER_SITE_OTHER): Payer: Medicare Other | Admitting: Podiatry

## 2015-10-12 DIAGNOSIS — B351 Tinea unguium: Secondary | ICD-10-CM | POA: Diagnosis not present

## 2015-10-12 DIAGNOSIS — M79674 Pain in right toe(s): Secondary | ICD-10-CM

## 2015-10-12 DIAGNOSIS — M79675 Pain in left toe(s): Secondary | ICD-10-CM | POA: Diagnosis not present

## 2015-10-12 NOTE — Patient Instructions (Signed)
Instructions for foot care on a daily basis: Continue to wear socks or or slippers Place to foam over right great toe joint daily Apply skin lotion to feet daily  [place]ec[place]Bishop 10/12/2015

## 2015-10-13 NOTE — Progress Notes (Signed)
Patient ID: Debra Tran, female   DOB: Feb 09, 1928, 82 y.o.   MRN: [id number]  Subjective: This patient presents today with her daughter present to treatment room and is requesting debridement of her mother's thickened and elongated toenails. Patient has history of friction lesion right first MPJ overlying bunion deformity that had a history of skin breakdown the area associated with diabetic shoes. Patient no longer wears the most recently dispensed diabetic shoes and wears slippers or socks only. Occasionally when leaving assisted-living patient wears an older pair of diabetic shoes that have no history of friction irritation  Objective: Patient has difficulty responding to questions  Vascular: DP and PT pulses 1/4 bilaterally Capillary reflex delayed bilaterally  Dermatological: No open skin lesions bilaterally Eschar dorsal right hallux and distal left toe HAV right without open lesions overlying the bunion The toenails are elongated, brittle, deformed and tender when palpated 6-10 Dry skin bilaterally The skin and skeletal: HAV deformity right with bunion  Assessment: Symptomatic onychomycoses 6-10 HAV right  Plan: Debridement toenails 6-10 mechanically an electrical without any bleeding Continue wearing socks or slippers Okay to place tubefoam over right first MPJ and a tube foam was dispensed today Apply skin lotion to feet daily  Written instructions in the after visit summary: Continue to wear socks or slippers Place tube foam over right great toe joint daily Apply skin lotion to feet daily  Reappoint 3 months

## 2015-10-19 ENCOUNTER — Non-Acute Institutional Stay (SKILLED_NURSING_FACILITY): Payer: Medicare Other | Admitting: Adult Health

## 2015-10-19 ENCOUNTER — Encounter: Payer: Self-pay | Admitting: Podiatry

## 2015-10-19 ENCOUNTER — Ambulatory Visit (INDEPENDENT_AMBULATORY_CARE_PROVIDER_SITE_OTHER): Payer: Medicare Other | Admitting: Podiatry

## 2015-10-19 ENCOUNTER — Encounter: Payer: Self-pay | Admitting: Adult Health

## 2015-10-19 DIAGNOSIS — Z794 Long term (current) use of insulin: Secondary | ICD-10-CM

## 2015-10-19 DIAGNOSIS — E1169 Type 2 diabetes mellitus with other specified complication: Secondary | ICD-10-CM | POA: Diagnosis not present

## 2015-10-19 DIAGNOSIS — K219 Gastro-esophageal reflux disease without esophagitis: Secondary | ICD-10-CM

## 2015-10-19 DIAGNOSIS — G2401 Drug induced subacute dyskinesia: Secondary | ICD-10-CM | POA: Diagnosis not present

## 2015-10-19 DIAGNOSIS — E785 Hyperlipidemia, unspecified: Secondary | ICD-10-CM

## 2015-10-19 DIAGNOSIS — L02611 Cutaneous abscess of right foot: Secondary | ICD-10-CM | POA: Diagnosis not present

## 2015-10-19 DIAGNOSIS — F209 Schizophrenia, unspecified: Secondary | ICD-10-CM | POA: Diagnosis not present

## 2015-10-19 DIAGNOSIS — F0390 Unspecified dementia without behavioral disturbance: Secondary | ICD-10-CM

## 2015-10-19 DIAGNOSIS — L03031 Cellulitis of right toe: Secondary | ICD-10-CM | POA: Diagnosis not present

## 2015-10-19 DIAGNOSIS — I10 Essential (primary) hypertension: Secondary | ICD-10-CM

## 2015-10-19 DIAGNOSIS — E119 Type 2 diabetes mellitus without complications: Secondary | ICD-10-CM | POA: Diagnosis not present

## 2015-10-19 MED ORDER — CEPHALEXIN 500 MG PO CAPS: 500.0000 mg | ORAL_CAPSULE | Freq: Two times a day (BID) | ORAL | Status: DC

## 2015-10-19 NOTE — Patient Instructions (Signed)
There is a blister formation with an inflammatory base on the right great toe/cellulitis Apply topical antibiotic ointment daily such as triple antibiotic ointment and cover with Band-Aid until skin appears normal Rx cephalexin 500 mg by mouth twice a day 7 days Only wear slippers or thick padded socks Return for previously scheduled visit or sooner if you have concern  Gean Birchwood, [place] 10/19/2015

## 2015-10-19 NOTE — Progress Notes (Signed)
Patient ID: Debra Tran, female   DOB: 1927/04/24, 82 y.o.   MRN: [id number]   Location:   [place] Room Number: 225-A Place of Service:  SNF (31)   CODE STATUS: Full Code  Allergies  Allergen Reactions  . Ether Nausea And Vomiting    Chief Complaint  Patient presents with  . Medical Management of Chronic Issues    HPI:  She is a long term resident of this facility being seen for the management of her chronic illnesses.  She tells me that she is feeling good and has no complaints. She does get out of bed on a daily basis; and does engage in social activities. There are no nursing concerns at this time.  Her cbg's in the am are good; but do increase as the day progresses.    Past Medical History  Diagnosis Date  . Phobia   . Macular degeneration   . Narcissism   . Schizophrenia, paranoid ([place])   . Fungal dermatitis   . Hyperlipidemia   . Mild cognitive impairment   . Diabetes mellitus type 2 in obese ([place])   . Stress incontinence, female   . Allergic rhinitis   . Normocytic anemia   . Depressive disorder   . Hypertension   . CATARACT 06/06/2006    Qualifier: Diagnosis of  By: Genene Churn MD, Janett Billow      Past Surgical History  Procedure Laterality Date  . Cataract extraction  6 16 2006  . Cryotherapy  2 15 2006    facial AK  . Total abdominal hysterectomy w/ bilateral salpingoophorectomy      due to endometriosis  non malignant  . Hyperplastic   4 20 2006    AK shave biopsy  . Abdominal hysterectomy      due to fibroids  . Intraocular injection  [id number]    Social History   Social History  . Marital Status: Divorced    Spouse Name: N/A  . Number of Children: N/A  . Years of Education: N/A   Occupational History  . Not on file.   Social History Main Topics  . Smoking status: Former Research scientist (life sciences)  . Smokeless tobacco: Not on file  . Alcohol Use: No  . Drug Use: No  . Sexual Activity: Not on file   Other Topics Concern  . Not on file    Social History Narrative   Lives in [facility] (assisted living), remotely smoked and chewed tobacco. Daughter([name])  drives her, independent of all ADL's   Family History  Problem Relation Age of Onset  . Heart disease Mother   . Stroke Mother   . Heart disease Father   . Stroke Father   . Cancer Cousin     liver and colon      VITAL SIGNS BP 132/73 mmHg  Pulse 71  Temp(Src) 98.3 F (36.8 C) (Oral)  Resp 17  Ht 4\' 9"  (1.448 m)  Wt 178 lb (80.74 kg)  BMI 38.51 kg/m2  SpO2 93%  Patient's Medications  New Prescriptions   No medications on file  Previous Medications   ACETAMINOPHEN (TYLENOL) 325 MG TABLET    Take 650 mg by mouth every 4 (four) hours as needed.   ASPIRIN EC 81 MG TABLET    Take 81 mg by mouth every morning. For heart disease   ATORVASTATIN (LIPITOR) 20 MG TABLET    Take 1 tablet (20 mg total) by mouth at bedtime.   BENZTROPINE (COGENTIN) 1 MG TABLET  Take 1 mg by mouth at bedtime.   CALCIUM CARBONATE-VITAMIN D (CALCIUM 600-D) 600-400 MG-UNIT PER TABLET    Take 1 tablet by mouth every morning.    CETIRIZINE (ZYRTEC) 10 MG TABLET    Take 10 mg by mouth daily.   CHOLECALCIFEROL (VITAMIN D) 1000 UNITS TABLET    Take 1,000 Units by mouth daily.   CYANOCOBALAMIN 1000 MCG TABLET    Take 100 mcg by mouth daily.   DOCUSATE SODIUM (COLACE) 100 MG CAPSULE    Take 1 capsule (100 mg total) by mouth daily. For constipation   DONEPEZIL (ARICEPT) 10 MG TABLET    Take 10 mg by mouth at bedtime.   ESCITALOPRAM (LEXAPRO) 10 MG TABLET    Take 20 mg by mouth at bedtime. For depression   GLIPIZIDE (GLUCOTROL) 10 MG TABLET    Take 5 mg by mouth 2 (two) times daily before a meal.    HYPROMELLOSE 0.4 % SOLN    Place 1 drop into both eyes 3 (three) times daily.   INSULIN DETEMIR (LEVEMIR FLEXPEN) 100 UNIT/ML PEN    Inject 5 Units into the skin at bedtime.   INSULIN LISPRO [place]    Inject into the skin. Inject 3 units subcutaneous;y BID for DM type 2. Inject 3 units Humalog  sq BID with lunch and supper. Notify MD for CBG < or =60, > or = 450   KETOTIFEN FUMARATE (ZADITOR OP)    Place 1 drop into both eyes 2 (two) times daily.   MEMANTINE HCL ER (NAMENDA XR) 28 MG CP24    Take 28 mg by mouth daily. For dementia   MULTIPLE VITAMIN (MULTIVITAMIN WITH MINERALS) TABS    Take 1 tablet by mouth every morning.   NEOMYCIN-POLYMYXIN B-DEXAMETHASONE (MAXITROL) 3.5-10000-0.1 OINT    Place 1 application into the right eye 3 (three) times daily.   NORTRIPTYLINE (PAMELOR) 10 MG CAPSULE    Take 10 mg by mouth at bedtime.   NUTRITIONAL SUPPLEMENTS (NUTRITIONAL SUPPLEMENT PO)    Take by mouth. HSG Puree texture, Nectar consistency   OLANZAPINE (ZYPREXA) 5 MG TABLET    Take 10 mg by mouth at bedtime.    OMEPRAZOLE (PRILOSEC) 20 MG CAPSULE    Take 1 capsule (20 mg total) by mouth 2 (two) times daily.   TRIAMCINOLONE (KENALOG) 0.025 % OINTMENT    Apply 1 application topically daily. Apply to arms for skin integrity  Modified Medications   No medications on file  Discontinued Medications   INSULIN LISPRO (HUMALOG) 100 UNIT/ML INJECTION    Inject 0.03 mLs (3 Units total) into the skin 2 (two) times daily with a meal. Give with lunch and supper for cbg >=200     SIGNIFICANT DIAGNOSTIC EXAMS  03-07-14: chest x-ray: right lower lobe mass (2.5 cm)   03-19-14: ct of chest: No parenchymal mass is noted within the lungs. No acute abnormality Noted.  08-19-14: mammogram: scattered fibroglandular tissue   12-30-14: right eye intraocular injection    LABS REVIEWED:  01-10-15: glucose 200; bun 14.0; creat 0.87; k+ 4.2; na++ 143; liver normal albumin 3.5; hgb a1c 6.7  Chol 147; ldl 54; trig 248; hdl 44  02-25-15: urine micro-albumin <1.2  07-12-15: wbc 8.2; hgb 12.7; hct 38.1; mcv 84.2; plt 229; glucose 94; bun 13.0; creat 0.72; k+ 4.4; na++ 141; liver normal albumin 3.4; hgb a1c 6.4; chol 135; ldl 64; trig 130; hdl 45      Review of Systems Constitutional: Negative for  malaise/fatigue.  Respiratory:  Negative for cough and shortness of breath.   Cardiovascular: Negative for chest pain, palpitations and leg swelling.  Gastrointestinal: Negative for heartburn, abdominal pain and constipation.  Musculoskeletal: Negative for myalgias and joint pain.  Skin: Negative.   Neurological: Negative for headaches.  Psychiatric/Behavioral: The patient is not nervous/anxious.       Physical Exam Constitutional: No distress.  Overweight   Neck: Neck supple. No JVD present. No thyromegaly present.  Cardiovascular: Normal rate, regular rhythm and intact distal pulses.   Respiratory: Effort normal and breath sounds normal. No respiratory distress.  GI: Soft. Bowel sounds are normal. She exhibits no distension. There is no tenderness.  Musculoskeletal: She exhibits no edema.  Is able to move all extremities Has very little abnormal tongue movement  Neurological: She is alert.  Skin: Skin is warm and dry. She is not diaphoretic.       ASSESSMENT/ PLAN:  1. Dementia without behavior disturbance; is without change will continue aricept 10 mg daily and namenda xr 28 mg daily will not make changes will monitor   Her current weight is 178 pounds.   2. Tremor: she does not have a resting tremor present she does have a small amount of abnormal tongue movement and lip tremor. Will continue cogentin 0.5 mg nightly   3. Dyslipidemia: will continue lipitor 20 mg daily ldl is 64  4. Diabetes  Will continue levemir  5 units;  will continue glipizide 5 mg twice daily  Will change novolog to 5 units with meals and will check cbg twice daily  her hgb a1c is 6.4.  and will monitor her status.   5. [name]: will continue prilosec 20 mg twice daily   6. Constipation: will continue colace daily    7. Depression: will continue lexapro 20 mg nightly and pamelor 10 mg nightly and will monitor   8. Schizophrenia: is stable will continue zyprexa 10 mg nightly and will monitor   Is  followed by psych services.  Will continue cogentin 0.5 mg nightly for tardive dyskinesia and will monitor  She requires this current dose of zyprexa at this time; as she is emotionally stable; lower her dose could adversely affect her quality of life and the benefits of this medication outweigh the risks.   9. Macular degeneration: will contine  maxitrol to right eye three times daily and zaditor to both eyes twice daily     Ok Edwards NP [facility]  Contact [phone] Monday through Friday 8am- 5pm  After hours call [phone]

## 2015-10-20 NOTE — Progress Notes (Signed)
Patient ID: Debra Tran, female   DOB: 07-20-1927, 82 y.o.   MRN: [id number]  Subjective: This patient presents with her daughter the treatment room. Patient's daughter has noticed a blister on the right great toe developing inthe last week and requests evaluation of this lesion. Patient's primary physician in nursing home recommended observation of this lesion.  Medical history Unspecified dementia with behavioral disturbance Schizoaffective disorder Major dispensed of disorder Hyperlipidemia Unspecified macular degeneration Gastroesophageal reflux disease without esophagitis Primary generalized osteoarthritis Type 2 diabetes with unspecified complications Dysphagia oral phase  No known allergies  Objective: BP 160/89 Pulse 88 Respirations 16  Patient not able to respond to questioning DP and PT pulses 1/4 bilaterally Capillary reflex immediate bilaterally 5 mm blister medial right hallux with inflammatory base proximally 10 mm. There is no active drainage, edema, warmth in around the blister area HAV right No open skin lesions bilaterally  Assessment: Blister formation right hallux with low-grade cellulitis Type II diabetic  Plan: Reviewed the results of exam with patient and patient's daughter Treatment plan: Apply topical antibiotic ointment such as triple antibiotic ointment daily and cover blister until skin appears normal Rx cephalexin 500 mg by mouth twice a day 7 days Wear slippers or thick padded socks only Return for previously scheduled visit or sooner if have a concern

## 2015-10-21 DIAGNOSIS — I70235 Atherosclerosis of native arteries of right leg with ulceration of other part of foot: Secondary | ICD-10-CM | POA: Diagnosis not present

## 2015-10-27 ENCOUNTER — Telehealth: Payer: Self-pay | Admitting: *Deleted

## 2015-10-27 NOTE — Telephone Encounter (Signed)
Rhoderick Moody, RN states pt's dtr, Lorriane Shire showed to apply foam tube dressing to the right big toe and in doing so made a wound to pt's right 1st lateral toe. Osvaldo Shipper, RN states the foam tube dressing appears to be too tight and is causing a shearing affect to the right 1st toe.  I told L. Grant to stop the foam tube dressing, apply triple antibiotic ointment to lateral and medial toe wounds, cover those areas with gauze and pad bunion with gauze and secure with roll gauze daily, until seen.  Osvaldo Shipper, RN states understanding and that pt's dtr will be mad due to her call and the change of the dressing orders.  I told L. Fatima Sanger, RN I would explain to Lorriane Shire if there was a problem, but a too tight dressing was not helping pt.  I transferred L. Fatima Sanger, RN to schedulers to set an appt with Dr. Amalia Hailey to be seen next week and told her to call again with concerns and if needed use the after hours contact line.

## 2015-11-02 ENCOUNTER — Ambulatory Visit (INDEPENDENT_AMBULATORY_CARE_PROVIDER_SITE_OTHER): Payer: Medicare Other | Admitting: Podiatry

## 2015-11-02 DIAGNOSIS — L989 Disorder of the skin and subcutaneous tissue, unspecified: Secondary | ICD-10-CM | POA: Diagnosis not present

## 2015-11-02 DIAGNOSIS — M21619 Bunion of unspecified foot: Secondary | ICD-10-CM | POA: Diagnosis not present

## 2015-11-02 DIAGNOSIS — R238 Other skin changes: Secondary | ICD-10-CM

## 2015-11-03 NOTE — Progress Notes (Signed)
Subjective:     Patient ID: Debra Tran, female   DOB: 12-18-1927, 82 y.o.   MRN: [id number]  HPI patient presents with caregiver with changes of the right big toe that they were concerned about and wanted to make sure there was no problems. States it's no longer red and it has crusted but they like not to have to soak if possible   Review of Systems     Objective:   Physical Exam Neurovascular status intact muscle strength was adequate with patient noted to have a small brown discoloration right big toe which was a previous blister with no proximal erythema edema and slight redness on the first metatarsal head right localized in nature with no drainage or breakdown of tissue    Assessment:     Localized process with no indications of systemic or cellulitic disease    Plan:     Advised on no longer requiring soaks since it has crusted over and to do daily inspections and changes were to occur to let us know immediately

## 2015-11-14 ENCOUNTER — Other Ambulatory Visit: Payer: Self-pay | Admitting: Dermatology

## 2015-11-14 DIAGNOSIS — D0439 Carcinoma in situ of skin of other parts of face: Secondary | ICD-10-CM | POA: Diagnosis not present

## 2015-11-16 DIAGNOSIS — E118 Type 2 diabetes mellitus with unspecified complications: Secondary | ICD-10-CM | POA: Diagnosis not present

## 2015-11-16 DIAGNOSIS — E785 Hyperlipidemia, unspecified: Secondary | ICD-10-CM | POA: Diagnosis not present

## 2015-11-21 ENCOUNTER — Ambulatory Visit: Payer: Medicare Other

## 2015-11-21 ENCOUNTER — Ambulatory Visit (INDEPENDENT_AMBULATORY_CARE_PROVIDER_SITE_OTHER): Payer: Medicare Other | Admitting: Podiatry

## 2015-11-21 ENCOUNTER — Telehealth: Payer: Self-pay | Admitting: *Deleted

## 2015-11-21 ENCOUNTER — Ambulatory Visit (INDEPENDENT_AMBULATORY_CARE_PROVIDER_SITE_OTHER): Payer: Medicare Other

## 2015-11-21 DIAGNOSIS — M79671 Pain in right foot: Secondary | ICD-10-CM

## 2015-11-21 MED ORDER — SULFAMETHOXAZOLE-TRIMETHOPRIM 800-160 MG PO TABS: 1.0000 | ORAL_TABLET | Freq: Two times a day (BID) | ORAL | 0 refills | Status: DC

## 2015-11-21 NOTE — Telephone Encounter (Signed)
Debra Tran, Wound Care Nurse [facility]. Called [phone] to talk with [name] and she had gone to lunch. I spoke with Debra Tran, director of Nursing and she states they would need written orders from the doctor seen pt today.  I told her they would have clear instructions on a prescription if there was no order papers. I informed Debra Tran a rx with wound care orders was written by Dr. Daylene Katayama, given to pt's dtr, Debra Tran.

## 2015-11-22 ENCOUNTER — Telehealth: Payer: Self-pay | Admitting: *Deleted

## 2015-11-22 NOTE — Telephone Encounter (Addendum)
Pt's dtr, Lorriane Shire states Dr. Amalia Hailey was going to call in a prescription yesterday, refilling one from 2 weeks ago, but it is not at the pharmacy.  I reviewed Medication Orders and Septra DS was called to [place].  I left message for Lorriane Shire to call with the appropriated pharmacy. I called L. Fatima Sanger, [employer] of Nursing [id number]  and gave orders for Septra DS #14 one tablet bid for 7 days as ordered yesterday. Osvaldo Shipper states she will get the orders started.12/07/2015-Ms Ouida Sills - [facility] states they need to check information on Bactrim DS.  I left message to call again concerning the Bactrim DS. 12/28/2015-Ms T. Ouida Sills - wound care nurse [place] request 11/28/2015 notes, states pt was never on Silvadene. Faxed to [phone]. 12/29/2015-Pt's dtr, Lorriane Shire asked for the instruction at [facility] 12/21/2015. I read Dr. Phoebe Perch PLAN from the visit and Lorriane Shire seemed to be happy. 01/09/2016-Ms Ouida Sills, wound care nurse at [facility] states she needs copy of last office visit with Dr. Amalia Hailey, and what is the padding needed for pt's right bunion. I told them it appears the padding was to be a soft shoe or sock with non-skid sole, but I would discuss with Dr. Amalia Hailey 01/10/2016, and call again. refaxed 12/21/2015 office visit notes to Ms Ouida Sills. 01/10/2016-Informed Ms Ouida Sills [facility], Dr. Amalia Hailey stated it would be fine for pt to wear oval foam pad over right bunion with the pad out of the hole and covered with a bandaid. Informed pt's dtr, Lorriane Shire of Dr. Phoebe Perch order and that it had been called to Ms Ouida Sills at [place].

## 2015-11-24 NOTE — Progress Notes (Signed)
Subjective:     Patient ID: Debra Tran, female   DOB: [birth year], 82 y.o.   MRN: [id number]  HPI patient of Dr. Cannon Kettle who points with discomfort plantar aspect right foot localized in nature within the mid arch area. She is in relatively poor health   Review of Systems     Objective:   Physical Exam Neurovascular status unchanged from previous visit with patient noted to have discomfort in the plantar right foot with a small open area measuring approximately 6 x 6 mm with no proximal edema erythema or drainage noted    Assessment:     Plantar ulceration with localized redness    Plan:     H&P condition discussed with caregiver and the chances for long-term infection or possible amputation. Today I debrided the area flushed it found to be superficial applied Iodosorb with dressing and reappoint to reevaluate in the next week or earlier if any issues should occur

## 2015-11-28 ENCOUNTER — Ambulatory Visit (INDEPENDENT_AMBULATORY_CARE_PROVIDER_SITE_OTHER): Payer: Medicare Other | Admitting: Podiatry

## 2015-11-28 DIAGNOSIS — L989 Disorder of the skin and subcutaneous tissue, unspecified: Secondary | ICD-10-CM

## 2015-11-28 DIAGNOSIS — R238 Other skin changes: Secondary | ICD-10-CM

## 2015-11-28 DIAGNOSIS — L03115 Cellulitis of right lower limb: Secondary | ICD-10-CM | POA: Diagnosis not present

## 2015-11-28 NOTE — Progress Notes (Signed)
Subjective:     Patient ID: Debra Tran, female   DOB: [birth year], 82 y.o.   MRN: [id number]  HPI patient presents stating she is doing better but she wanted to get it checked   Review of Systems     Objective:   Physical Exam Neurovascular status intact muscle strength adequate with patient found to have mild breakdown between the hallux second toe right that's improved with conservative care but still has some slight drainage    Assessment:     Doing well post mild ulceration right first hallux and second toe that has resolved at this time    Plan:     Advised on the continuation of soaks and padding and using Silvadene and if any problems should occur or any redness drainage or other issues to let us know immediately

## 2015-12-08 ENCOUNTER — Non-Acute Institutional Stay (SKILLED_NURSING_FACILITY): Payer: Medicare Other | Admitting: Internal Medicine

## 2015-12-08 ENCOUNTER — Encounter: Payer: Self-pay | Admitting: Internal Medicine

## 2015-12-08 DIAGNOSIS — E785 Hyperlipidemia, unspecified: Secondary | ICD-10-CM

## 2015-12-08 DIAGNOSIS — Z794 Long term (current) use of insulin: Secondary | ICD-10-CM

## 2015-12-08 DIAGNOSIS — E1169 Type 2 diabetes mellitus with other specified complication: Secondary | ICD-10-CM | POA: Diagnosis not present

## 2015-12-08 DIAGNOSIS — I10 Essential (primary) hypertension: Secondary | ICD-10-CM | POA: Diagnosis not present

## 2015-12-08 DIAGNOSIS — E119 Type 2 diabetes mellitus without complications: Secondary | ICD-10-CM

## 2015-12-08 DIAGNOSIS — F0390 Unspecified dementia without behavioral disturbance: Secondary | ICD-10-CM | POA: Diagnosis not present

## 2015-12-08 DIAGNOSIS — F209 Schizophrenia, unspecified: Secondary | ICD-10-CM

## 2015-12-08 NOTE — Progress Notes (Signed)
Patient ID: Debra Tran, female   DOB: 07-Aug-1927, 82 y.o.   MRN: [id number]    DATE:  12/08/2015  Location:    [place] Room Number: 225 B Place of Service: SNF (31)   Extended Emergency Contact Information Primary Emergency Contact: Doughman,[name] Address: [address]          [place], [place] [address] [place] Phone: [phone] Mobile Phone: [phone] Relation: Daughter  Advanced Directive information Does patient have an advance directive?: Yes, Type of Advance Directive: Out of facility DNR (pink MOST or yellow form), Does patient want to make changes to advanced directive?: No - Patient declined  Chief Complaint  Patient presents with  . Medical Management of Chronic Issues    Routine Visit    HPI:  82 yo female long term resident seen today for f/u. She has no concerns today. She is a poor historian due to dementia. Hx obtained from chart.  Dementia without behavior disturbance - stable on aricept 10 mg daily and namenda xr 28 mg daily. current weight is 180 pounds.   Tremor - she does not have a resting tremor at present but she does have a small amount of abnormal tongue movement and lip tremor. Takes cogentin 0.5 mg nightly   Dyslipidemia - stable on lipitor 20 mg daily. LDL 64  DM - stable on levemir  5 units;   glipizide 5 mg twice daily; novolog 5 units with meals and will check cbg twice daily. A1c 6.4%    GERD - stable on  prilosec 20 mg twice daily   Constipation - stable on colace daily    Depression- stable on lexapro 20 mg nightly and pamelor 10 mg nightly    Schizophrenia - stable on zyprexa 10 mg nightly as a lower dose could adversely affect her quality of life and the benefits of this medication outweigh the risks. followed by psych services. Takes cogentin 0.5 mg nightly for tardive dyskinesia    Macular degeneration - gets maxitrol to right eye three times daily and zaditor to both eyes twice daily      Past Medical History:  Diagnosis Date  . Allergic rhinitis   . CATARACT 06/06/2006   Qualifier: Diagnosis of  By: Genene Churn MD, Janett Billow    . Depressive disorder   . Diabetes mellitus type 2 in obese ([place])   . Fungal dermatitis   . Hyperlipidemia   . Hypertension   . Macular degeneration   . Mild cognitive impairment   . Narcissism   . Normocytic anemia   . Phobia   . Schizophrenia, paranoid ([place])   . Stress incontinence, female     Past Surgical History:  Procedure Laterality Date  . ABDOMINAL HYSTERECTOMY     due to fibroids  . CATARACT EXTRACTION  6 16 2006  . CRYOTHERAPY  2 15 2006   facial AK  . hyperplastic   4 20 2006   AK shave biopsy  . intraocular injection  [id number]  . TOTAL ABDOMINAL HYSTERECTOMY W/ BILATERAL SALPINGOOPHORECTOMY     due to endometriosis  non malignant    Patient Care Team: Hennie Duos, MD as PCP - General (Internal Medicine) Hayden Pedro, MD as Attending Physician (Ophthalmology) Gerlene Fee, NP as Nurse Practitioner (Nurse Practitioner) [facility] ([place])  Social History   Social History  . Marital status: Divorced    Spouse name: N/A  . Number of children: N/A  . Years  of education: N/A   Occupational History  . Not on file.   Social History Main Topics  . Smoking status: Former Research scientist (life sciences)  . Smokeless tobacco: Never Used  . Alcohol use No  . Drug use: No  . Sexual activity: Not on file   Other Topics Concern  . Not on file   Social History Narrative   Lives in [facility] (assisted living), remotely smoked and chewed tobacco. Daughter([name])  drives her, independent of all ADL's     reports that she has quit smoking. She has never used smokeless tobacco. She reports that she does not drink alcohol or use drugs.  Family History  Problem Relation Age of Onset  . Heart disease Mother   . Stroke Mother   . Heart disease Father   . Stroke Father   . Cancer Cousin      liver and colon   Family Status  Relation Status  . Mother   . Father   . Cousin     Immunization History  Administered Date(s) Administered  . Influenza Split 02/15/2011, 01/21/2012  . Influenza Whole 02/05/2007, 01/17/2010, 01/28/2013  . Influenza-Unspecified 06/04/2014, 05/17/2015  . PPD Test 08/24/2015  . Pneumococcal Polysaccharide-23 04/10/1999  . Td 10/07/2001    Allergies  Allergen Reactions  . Ether Nausea And Vomiting    Medications: Patient's Medications  New Prescriptions   No medications on file  Previous Medications   ACETAMINOPHEN (TYLENOL) 325 MG TABLET    Take 650 mg by mouth every 4 (four) hours as needed.   ASPIRIN EC 81 MG TABLET    Take 81 mg by mouth every morning. For heart disease   ATORVASTATIN (LIPITOR) 20 MG TABLET    Take 1 tablet (20 mg total) by mouth at bedtime.   BENZTROPINE (COGENTIN) 1 MG TABLET    Take 1 mg by mouth at bedtime.   CALCIUM CARBONATE-VITAMIN D (CALCIUM 600-D) 600-400 MG-UNIT PER TABLET    Take 1 tablet by mouth every morning.    CETIRIZINE (ZYRTEC) 10 MG TABLET    Take 10 mg by mouth daily.   CHOLECALCIFEROL (VITAMIN D) 1000 UNITS TABLET    Take 1,000 Units by mouth daily.   CYANOCOBALAMIN 1000 MCG TABLET    Take 100 mcg by mouth daily.   DOCUSATE SODIUM (COLACE) 100 MG CAPSULE    Take 1 capsule (100 mg total) by mouth daily. For constipation   DONEPEZIL (ARICEPT) 10 MG TABLET    Take 10 mg by mouth at bedtime.   ESCITALOPRAM (LEXAPRO) 10 MG TABLET    Take 20 mg by mouth at bedtime. For depression   GLIPIZIDE (GLUCOTROL) 10 MG TABLET    Take 5 mg by mouth 2 (two) times daily before a meal.    HYPROMELLOSE 0.4 % SOLN    Place 1 drop into both eyes 3 (three) times daily.   INSULIN ASPART (NOVOLOG) 100 UNIT/ML INJECTION    Inject 5 Units into the skin 3 (three) times daily before meals.   INSULIN DETEMIR (LEVEMIR FLEXPEN) 100 UNIT/ML PEN    Inject 5 Units into the skin at bedtime.   KETOTIFEN FUMARATE (ZADITOR OP)    Place 1  drop into both eyes 2 (two) times daily.   MEMANTINE HCL ER (NAMENDA XR) 28 MG CP24    Take 28 mg by mouth daily. For dementia   MULTIPLE VITAMIN (MULTIVITAMIN WITH MINERALS) TABS    Take 1 tablet by mouth every morning.   NEOMYCIN-POLYMYXIN B-DEXAMETHASONE (MAXITROL) 3.5-10000-0.1  OINT    Place 1 application into the right eye 3 (three) times daily.   NORTRIPTYLINE (PAMELOR) 10 MG CAPSULE    Take 10 mg by mouth at bedtime.   NUTRITIONAL SUPPLEMENTS (NUTRITIONAL SUPPLEMENT PO)    Take by mouth. HSG Puree texture, Nectar consistency   OLANZAPINE (ZYPREXA) 5 MG TABLET    Take 10 mg by mouth at bedtime.    OMEPRAZOLE (PRILOSEC) 20 MG CAPSULE    Take 20 mg by mouth daily.   SULFAMETHOXAZOLE-TRIMETHOPRIM (BACTRIM DS,SEPTRA DS) 800-160 MG TABLET    Take 1 tablet by mouth 2 (two) times daily.   TRIAMCINOLONE (KENALOG) 0.025 % OINTMENT    Apply 1 application topically daily. Apply to arms for skin integrity  Modified Medications   No medications on file  Discontinued Medications   CEPHALEXIN (KEFLEX) 500 MG CAPSULE    Take 1 capsule (500 mg total) by mouth 2 (two) times daily.   INSULIN LISPRO [place]    Inject into the skin. Inject 3 units subcutaneous;y BID for DM type 2. Inject 3 units Humalog sq BID with lunch and supper. Notify MD for CBG < or =60, > or = 450   OMEPRAZOLE (PRILOSEC) 20 MG CAPSULE    Take 1 capsule (20 mg total) by mouth 2 (two) times daily.    Review of Systems  Unable to perform ROS: Dementia    Vitals:   12/08/15 1450  BP: (!) 180/90  Pulse: 61  Resp: 18  Temp: 98.4 F (36.9 C)  TempSrc: Oral  SpO2: 99%  Weight: 180 lb 12.8 oz (82 kg)  Height: 4\' 9"  (1.448 m)   Body mass index is 39.12 kg/m.  Physical Exam  Constitutional: She appears well-developed.  Sitting in w/c in NAD, frail appearing  HENT:  Mouth/Throat: Oropharynx is clear and moist. No oropharyngeal exudate.  Eyes: Pupils are equal, round, and reactive to light. No scleral icterus.  Neck: Neck supple.  Carotid bruit is not present. No tracheal deviation present. No thyromegaly present.  Cardiovascular: Normal rate, regular rhythm and intact distal pulses.  Exam reveals no gallop and no friction rub.   Murmur (1/6 SEM) heard. Trace LE edema b/l. no calf TTP.   Pulmonary/Chest: Effort normal and breath sounds normal. No stridor. No respiratory distress. She has no wheezes. She has no rales.  Abdominal: Soft. Bowel sounds are normal. She exhibits no distension and no mass. There is no hepatomegaly. There is no tenderness. There is no rebound and no guarding.  Musculoskeletal: She exhibits edema.  Lymphadenopathy:    She has no cervical adenopathy.  Neurological: She is alert. She displays no tremor.  Skin: Skin is warm and dry. No rash noted.  B/l 5th toe calluses but no ulcerations. (+) redness, NT  Psychiatric: She has a normal mood and affect. Her behavior is normal.     Labs reviewed: No visits with results within 3 Month(s) from this visit.  Latest known visit with results is:  Nursing Home on 07/18/2015  Component Date Value Ref Range Status  . Hemoglobin 07/12/2015 12.7  12.0 - 16.0 g/dL Final  . HCT 07/12/2015 38  36 - 46 % Final  . Platelets 07/12/2015 229  150 - 399 K/L Final  . WBC 07/12/2015 8.2  10^3/mL Final  . Glucose 07/12/2015 94  mg/dL Final  . BUN 07/12/2015 13  4 - 21 mg/dL Final  . Creatinine 07/12/2015 0.7  0.5 - 1.1 mg/dL Final  . Potassium 07/12/2015 4.4  3.4 -  5.3 mmol/L Final  . Sodium 07/12/2015 141  137 - 147 mmol/L Final  . Triglycerides 07/12/2015 130  40 - 160 mg/dL Final  . Cholesterol 07/12/2015 135  0 - 200 mg/dL Final  . HDL 07/12/2015 45  35 - 70 mg/dL Final  . Alkaline Phosphatase 07/12/2015 83  25 - 125 U/L Final  . ALT 07/12/2015 11  7 - 35 U/L Final  . AST 07/12/2015 16  13 - 35 U/L Final  . Bilirubin, Total 07/12/2015 0.2  mg/dL Final    No results found.   Assessment/Plan   ICD-9-CM ICD-10-CM   1. Dementia without behavioral  disturbance, unspecified dementia type 294.20 F03.90   2. Schizophrenia, unspecified type ([place])  F20.9   3. Controlled type 2 diabetes mellitus without complication, with long-term current use of insulin (HCC) 250.00 E11.9    V58.67 Z79.4   4. Essential hypertension, benign 401.1 I10   5. Hyperlipidemia associated with type 2 diabetes mellitus ([place]) 250.80 E11.69    272.4 E78.5    Check cmp, A1c, lipid panel  Cont current meds as ordered  PT/OT/ST as indicated  Psych services to follow  Will follow   Shalonda Sachse S. Perlie Gold  [facility] [address] [place], [place] [address] [phone] Cell (Monday-Friday 8 AM - 5 PM) [phone] After 5 PM and follow prompts

## 2015-12-09 DIAGNOSIS — E785 Hyperlipidemia, unspecified: Secondary | ICD-10-CM | POA: Diagnosis not present

## 2015-12-09 DIAGNOSIS — E118 Type 2 diabetes mellitus with unspecified complications: Secondary | ICD-10-CM | POA: Diagnosis not present

## 2015-12-09 LAB — HEPATIC FUNCTION PANEL
ALK PHOS: 114 U/L (ref 25–125)
ALT: 18 U/L (ref 7–35)
AST: 18 U/L (ref 13–35)
BILIRUBIN, TOTAL: 0.2 mg/dL

## 2015-12-09 LAB — BASIC METABOLIC PANEL
BUN: 15 mg/dL (ref 4–21)
CREATININE: 1 mg/dL (ref 0.5–1.1)
Glucose: 256 mg/dL
Potassium: 4.5 mmol/L (ref 3.4–5.3)
Sodium: 137 mmol/L (ref 137–147)

## 2015-12-09 LAB — LIPID PANEL
Cholesterol: 155 mg/dL (ref 0–200)
HDL: 45 mg/dL (ref 35–70)
LDL Cholesterol: 61 mg/dL
Triglycerides: 246 mg/dL — AB (ref 40–160)

## 2015-12-09 LAB — HEMOGLOBIN A1C: HEMOGLOBIN A1C: 7.6

## 2015-12-14 ENCOUNTER — Ambulatory Visit: Payer: Medicare Other | Admitting: Podiatry

## 2015-12-15 DIAGNOSIS — E119 Type 2 diabetes mellitus without complications: Secondary | ICD-10-CM | POA: Diagnosis not present

## 2015-12-15 DIAGNOSIS — H353133 Nonexudative age-related macular degeneration, bilateral, advanced atrophic without subfoveal involvement: Secondary | ICD-10-CM | POA: Diagnosis not present

## 2015-12-15 DIAGNOSIS — Z794 Long term (current) use of insulin: Secondary | ICD-10-CM | POA: Diagnosis not present

## 2015-12-15 DIAGNOSIS — Z961 Presence of intraocular lens: Secondary | ICD-10-CM | POA: Diagnosis not present

## 2015-12-16 DIAGNOSIS — F259 Schizoaffective disorder, unspecified: Secondary | ICD-10-CM | POA: Diagnosis not present

## 2015-12-16 DIAGNOSIS — F329 Major depressive disorder, single episode, unspecified: Secondary | ICD-10-CM | POA: Diagnosis not present

## 2015-12-16 DIAGNOSIS — F0391 Unspecified dementia with behavioral disturbance: Secondary | ICD-10-CM | POA: Diagnosis not present

## 2015-12-21 ENCOUNTER — Ambulatory Visit (INDEPENDENT_AMBULATORY_CARE_PROVIDER_SITE_OTHER): Payer: Medicare Other | Admitting: Podiatry

## 2015-12-21 ENCOUNTER — Encounter: Payer: Self-pay | Admitting: Podiatry

## 2015-12-21 VITALS — BP 197/98 | HR 73 | Temp 95.9°F | Resp 14

## 2015-12-21 DIAGNOSIS — L989 Disorder of the skin and subcutaneous tissue, unspecified: Secondary | ICD-10-CM

## 2015-12-21 DIAGNOSIS — R238 Other skin changes: Secondary | ICD-10-CM

## 2015-12-21 NOTE — Patient Instructions (Addendum)
Today are no open sores or drainage on the right great toe joint area Discontinue soaks Discontinue Silvadene cream Apply pad around right great toe joint Wear soft shoe or sock only If you notice any sudden increase in pain, swelling, redness present to emergency department    Diabetes and Foot Care Diabetes may cause you to have problems because of poor blood supply (circulation) to your feet and legs. This may cause the skin on your feet to become thinner, break easier, and heal more slowly. Your skin may become dry, and the skin may peel and crack. You may also have nerve damage in your legs and feet causing decreased feeling in them. You may not notice minor injuries to your feet that could lead to infections or more serious problems. Taking care of your feet is one of the most important things you can do for yourself.  HOME CARE INSTRUCTIONS  Wear shoes at all times, even in the house. Do not go barefoot. Bare feet are easily injured.  Check your feet daily for blisters, cuts, and redness. If you cannot see the bottom of your feet, use a mirror or ask someone for help.  Wash your feet with warm water (do not use hot water) and mild soap. Then pat your feet and the areas between your toes until they are completely dry. Do not soak your feet as this can dry your skin.  Apply a moisturizing lotion or petroleum jelly (that does not contain alcohol and is unscented) to the skin on your feet and to dry, brittle toenails. Do not apply lotion between your toes.  Trim your toenails straight across. Do not dig under them or around the cuticle. File the edges of your nails with an emery board or nail file.  Do not cut corns or calluses or try to remove them with medicine.  Wear clean socks or stockings every day. Make sure they are not too tight. Do not wear knee-high stockings since they may decrease blood flow to your legs.  Wear shoes that fit properly and have enough cushioning. To break in  new shoes, wear them for just a few hours a day. This prevents you from injuring your feet. Always look in your shoes before you put them on to be sure there are no objects inside.  Do not cross your legs. This may decrease the blood flow to your feet.  If you find a minor scrape, cut, or break in the skin on your feet, keep it and the skin around it clean and dry. These areas may be cleansed with mild soap and water. Do not cleanse the area with peroxide, alcohol, or iodine.  When you remove an adhesive bandage, be sure not to damage the skin around it.  If you have a wound, look at it several times a day to make sure it is healing.  Do not use heating pads or hot water bottles. They may burn your skin. If you have lost feeling in your feet or legs, you may not know it is happening until it is too late.  Make sure your health care provider performs a complete foot exam at least annually or more often if you have foot problems. Report any cuts, sores, or bruises to your health care provider immediately. SEEK MEDICAL CARE IF:   You have an injury that is not healing.  You have cuts or breaks in the skin.  You have an ingrown nail.  You notice redness on your  legs or feet.  You feel burning or tingling in your legs or feet.  You have pain or cramps in your legs and feet.  Your legs or feet are numb.  Your feet always feel cold. SEEK IMMEDIATE MEDICAL CARE IF:   There is increasing redness, swelling, or pain in or around a wound.  There is a red line that goes up your leg.  Pus is coming from a wound.  You develop a fever or as directed by your health care provider.  You notice a bad smell coming from an ulcer or wound.   This information is not intended to replace advice given to you by your health care provider. Make sure you discuss any questions you have with your health care provider.   Document Released: 03/23/2000 Document Revised: 11/26/2012 Document Reviewed:  09/02/2012 Elsevier Interactive Patient Education 2016 [place] a protective pad around the bunion on the right foot Wear a sock are very soft shoe on the right foot  If you develop any sudden, pain, swelling, redness, fever to present to the emergency department  Return as needed or for any previous schedule visit for debridement of fungal toenails

## 2015-12-21 NOTE — Progress Notes (Signed)
   Subjective:    Patient ID: Debra Tran, female    DOB: Feb 27, 1928, 82 y.o.   MRN: [id number]  HPI    Patient presents today stating that there is no drainage or soreness for the past several weeks and around the right great toe joint area Chart review:  patient was initially seen by Dr.Regal on 11/02/2015 for a draining area without any cellulitis and at that time soaks were prescribed. Patient has history of ulceration that was evaluated on August 14 by Dr. Paulla Dolly that was treated with local wound care and application of Iodosorb gel Encounter 11/28/2015 Dr. Paulla Dolly noting improvement in the right ulceration recommended Silvadene and soaks and padding apparently to the first MPJ area  Review of Systems  Eyes: Positive for redness and itching.       Objective:   Physical Exam   BP 197/98 Pulse 73 Respiration 14 Temperature 95.9  Patient does respond to questioning Patient transfers from wheelchair to treatment chair DP and PT pulses nonpalpable bilaterally Capillary reflex immediate bilaterally Prominent HAV bunion right HAV bunion left No open skin lesions Low-grade erythema without edema, warmth, drainage medial right first MPJ      Assessment & Plan:   Assessment: No open skin lesions bilaterally Prominent HAV right with low-grade erythema without obvious sign of infection Decrease pedal pulses suggestive peripheral toe disease Type II diabetic  Plan: Discontinue soaks and Silvadene cream Apply protective pad around a great toe joint area, right foot Wear a soft shoe or sock with nonskid sole If notice any sudden increased pain, swelling, redness present to the emergency department  Reappoint at patient's request

## 2015-12-22 ENCOUNTER — Encounter (INDEPENDENT_AMBULATORY_CARE_PROVIDER_SITE_OTHER): Payer: Medicare Other | Admitting: Ophthalmology

## 2016-01-09 ENCOUNTER — Telehealth: Payer: Self-pay | Admitting: *Deleted

## 2016-01-09 NOTE — Telephone Encounter (Signed)
Entered in error

## 2016-01-11 ENCOUNTER — Encounter: Payer: Self-pay | Admitting: Adult Health

## 2016-01-11 ENCOUNTER — Non-Acute Institutional Stay (SKILLED_NURSING_FACILITY): Payer: Medicare Other | Admitting: Adult Health

## 2016-01-11 DIAGNOSIS — Z794 Long term (current) use of insulin: Secondary | ICD-10-CM | POA: Diagnosis not present

## 2016-01-11 DIAGNOSIS — E119 Type 2 diabetes mellitus without complications: Secondary | ICD-10-CM | POA: Diagnosis not present

## 2016-01-11 DIAGNOSIS — G2401 Drug induced subacute dyskinesia: Secondary | ICD-10-CM

## 2016-01-11 DIAGNOSIS — I10 Essential (primary) hypertension: Secondary | ICD-10-CM | POA: Diagnosis not present

## 2016-01-11 DIAGNOSIS — F209 Schizophrenia, unspecified: Secondary | ICD-10-CM | POA: Diagnosis not present

## 2016-01-11 DIAGNOSIS — F0150 Vascular dementia without behavioral disturbance: Secondary | ICD-10-CM | POA: Diagnosis not present

## 2016-01-11 DIAGNOSIS — E785 Hyperlipidemia, unspecified: Secondary | ICD-10-CM

## 2016-01-11 DIAGNOSIS — E1169 Type 2 diabetes mellitus with other specified complication: Secondary | ICD-10-CM

## 2016-01-11 LAB — DIGOXIN LEVEL: DIGOXIN LVL: 0.57

## 2016-01-11 NOTE — Progress Notes (Signed)
Patient ID: Debra Tran, female   DOB: [birth year], 82 y.o.   MRN: [id number]   Location:   [place] Room Number: 225-B Place of Service:  SNF (31)   CODE STATUS:   Allergies  Allergen Reactions  . Ether Nausea And Vomiting    Chief Complaint  Patient presents with  . Medical Management of Chronic Issues    Follow up     HPI:  She is a long term resident of this facility being seen for the management of her chronic illnesses. Overall her status is stable. She tells me that she is feeling good. She does get out of bed daily. There are no nursing concerns at this time.    Past Medical History:  Diagnosis Date  . Allergic rhinitis   . CATARACT 06/06/2006   Qualifier: Diagnosis of  By: Genene Churn MD, Janett Billow    . Depressive disorder   . Diabetes mellitus type 2 in obese ([place])   . Fungal dermatitis   . Hyperlipidemia   . Hypertension   . Macular degeneration   . Mild cognitive impairment   . Narcissism   . Normocytic anemia   . Phobia   . Schizophrenia, paranoid ([place])   . Stress incontinence, female     Past Surgical History:  Procedure Laterality Date  . ABDOMINAL HYSTERECTOMY     due to fibroids  . CATARACT EXTRACTION  6 16 2006  . CRYOTHERAPY  2 15 2006   facial AK  . hyperplastic   4 20 2006   AK shave biopsy  . intraocular injection  [id number]  . TOTAL ABDOMINAL HYSTERECTOMY W/ BILATERAL SALPINGOOPHORECTOMY     due to endometriosis  non malignant    Social History   Social History  . Marital status: Divorced    Spouse name: N/A  . Number of children: N/A  . Years of education: N/A   Occupational History  . Not on file.   Social History Main Topics  . Smoking status: Former Research scientist (life sciences)  . Smokeless tobacco: Never Used  . Alcohol use No  . Drug use: No  . Sexual activity: Not on file   Other Topics Concern  . Not on file   Social History Narrative   Lives in [facility] (assisted living), remotely smoked and chewed tobacco.  Daughter([name])  drives her, independent of all ADL's   Family History  Problem Relation Age of Onset  . Heart disease Mother   . Stroke Mother   . Heart disease Father   . Stroke Father   . Cancer Cousin     liver and colon      VITAL SIGNS BP 90/75   Pulse 73   Temp 97.1 F (36.2 C) (Oral)   Resp 18   Ht 4\' 9"  (1.448 m)   Wt 178 lb 6 oz (80.9 kg)   SpO2 97%   BMI 38.60 kg/m   Patient's Medications  New Prescriptions   No medications on file  Previous Medications   ACETAMINOPHEN (TYLENOL) 325 MG TABLET    Take 650 mg by mouth every 4 (four) hours as needed.   ASPIRIN EC 81 MG TABLET    Take 81 mg by mouth every morning. For heart disease   ATORVASTATIN (LIPITOR) 20 MG TABLET    Take 1 tablet (20 mg total) by mouth at bedtime.   BENZTROPINE (COGENTIN) 1 MG TABLET    Take 1 mg by mouth at bedtime.   CALCIUM CARBONATE-VITAMIN D (CALCIUM 600-D)  600-400 MG-UNIT PER TABLET    Take 1 tablet by mouth every morning.    CETIRIZINE (ZYRTEC) 10 MG TABLET    Take 10 mg by mouth daily.   CHOLECALCIFEROL (VITAMIN D) 1000 UNITS TABLET    Take 1,000 Units by mouth daily.   CYANOCOBALAMIN 1000 MCG TABLET    Take 100 mcg by mouth daily.   DOCUSATE SODIUM (COLACE) 100 MG CAPSULE    Take 1 capsule (100 mg total) by mouth daily. For constipation   DONEPEZIL (ARICEPT) 10 MG TABLET    Take 10 mg by mouth at bedtime.   ESCITALOPRAM (LEXAPRO) 10 MG TABLET    Take 20 mg by mouth at bedtime. For depression   GLIPIZIDE (GLUCOTROL) 10 MG TABLET    Take 5 mg by mouth 2 (two) times daily before a meal.    HYPROMELLOSE 0.4 % SOLN    Place 1 drop into both eyes 3 (three) times daily.   INSULIN ASPART (NOVOLOG) 100 UNIT/ML INJECTION    Inject 5 Units into the skin 3 (three) times daily before meals.   INSULIN DETEMIR (LEVEMIR FLEXPEN) 100 UNIT/ML PEN    Inject 5 Units into the skin at bedtime.   KETOTIFEN FUMARATE (ZADITOR OP)    Place 1 drop into both eyes 2 (two) times daily.   MEMANTINE HCL ER  (NAMENDA XR) 28 MG CP24    Take 28 mg by mouth daily. For dementia   MULTIPLE VITAMIN (MULTIVITAMIN WITH MINERALS) TABS    Take 1 tablet by mouth every morning.   NEOMYCIN-POLYMYXIN B-DEXAMETHASONE (MAXITROL) 3.5-10000-0.1 OINT    Place 1 application into the right eye 3 (three) times daily.   NORTRIPTYLINE (PAMELOR) 10 MG CAPSULE    Take 10 mg by mouth at bedtime.   NUTRITIONAL SUPPLEMENTS (NUTRITIONAL SUPPLEMENT PO)    Take by mouth. HSG Puree texture, Nectar consistency   OLANZAPINE (ZYPREXA) 5 MG TABLET    Take 10 mg by mouth at bedtime.    OMEPRAZOLE (PRILOSEC) 20 MG CAPSULE    Take 20 mg by mouth daily.   TRIAMCINOLONE (KENALOG) 0.025 % OINTMENT    Apply 1 application topically daily. Apply to arms for skin integrity  Modified Medications   No medications on file  Discontinued Medications   SULFAMETHOXAZOLE-TRIMETHOPRIM (BACTRIM DS,SEPTRA DS) 800-160 MG TABLET    Take 1 tablet by mouth 2 (two) times daily.     SIGNIFICANT DIAGNOSTIC EXAMS   03-07-14: chest x-ray: right lower lobe mass (2.5 cm)   03-19-14: ct of chest: No parenchymal mass is noted within the lungs. No acute abnormality Noted.  08-19-14: mammogram: scattered fibroglandular tissue   12-30-14: right eye intraocular injection    LABS REVIEWED:  01-10-15: glucose 200; bun 14.0; creat 0.87; k+ 4.2; na++ 143; liver normal albumin 3.5; hgb a1c 6.7  Chol 147; ldl 54; trig 248; hdl 44  02-25-15: urine micro-albumin <1.2  07-12-15: wbc 8.2; hgb 12.7; hct 38.1; mcv 84.2; plt 229; glucose 94; bun 13.0; creat 0.72; k+ 4.4; na++ 141; liver normal albumin 3.4; hgb a1c 6.4; chol 135; ldl 64; trig 130; hdl 45  12-09-15: glucose 256; bun 14.6; creat 1.01; k+ 4.5; na++ 137; liver normal albumin 3.5; chol 156; ldl 61; trig 246; hdl 45      Review of Systems Constitutional: Negative for malaise/fatigue.  Respiratory: Negative for cough and shortness of breath.   Cardiovascular: Negative for chest pain, palpitations and leg  swelling.  Gastrointestinal: Negative for heartburn, abdominal pain and constipation.  Musculoskeletal: Negative for myalgias and joint pain.  Skin: Negative.   Neurological: Negative for headaches.  Psychiatric/Behavioral: The patient is not nervous/anxious.       Physical Exam Constitutional: No distress.  Overweight   Neck: Neck supple. No JVD present. No thyromegaly present.  Cardiovascular: Normal rate, regular rhythm and intact distal pulses.   Respiratory: Effort normal and breath sounds normal. No respiratory distress.  GI: Soft. Bowel sounds are normal. She exhibits no distension. There is no tenderness.  Musculoskeletal: She exhibits no edema.  Is able to move all extremities Has very little abnormal tongue movement  Neurological: She is alert.  Skin: Skin is warm and dry. She is not diaphoretic.       ASSESSMENT/ PLAN:  1. Dementia without behavior disturbance; is without change will continue aricept 10 mg daily and namenda xr 28 mg daily will not make changes will monitor   Her current weight is 178 pounds.   2. Tremor: she does not have a resting tremor present she does have a small amount of abnormal tongue movement and lip tremor. Will continue cogentin 0.5 mg nightly   3. Dyslipidemia: will continue lipitor 20 mg daily ldl is 61  4. Diabetes  hgb a1c 6.4  Will continue levemir  5 units;  will continue glipizide 5 mg twice daily  novolog  5 units with meals    5. [name]: will continue prilosec 20 mg twice daily   6. Constipation: will continue colace daily    7. Depression: will continue lexapro 20 mg nightly and pamelor 10 mg nightly and will monitor   8. Schizophrenia: is stable will continue zyprexa 10 mg nightly and will monitor   Is followed by psych services.  Will continue cogentin 0.5 mg nightly for tardive dyskinesia and will monitor  She requires this current dose of zyprexa at this time; as she is emotionally stable; lower her dose could adversely  affect her quality of life and the benefits of this medication outweigh the risks.   9. Macular degeneration: will contine  maxitrol to right eye three times daily and zaditor to both eyes twice daily      Ok Edwards NP [facility]  Contact [phone] Monday through Friday 8am- 5pm  After hours call [phone]

## 2016-01-15 DIAGNOSIS — Z23 Encounter for immunization: Secondary | ICD-10-CM | POA: Diagnosis not present

## 2016-02-02 DIAGNOSIS — F329 Major depressive disorder, single episode, unspecified: Secondary | ICD-10-CM | POA: Diagnosis not present

## 2016-02-02 DIAGNOSIS — F0391 Unspecified dementia with behavioral disturbance: Secondary | ICD-10-CM | POA: Diagnosis not present

## 2016-02-02 DIAGNOSIS — F259 Schizoaffective disorder, unspecified: Secondary | ICD-10-CM | POA: Diagnosis not present

## 2016-02-06 ENCOUNTER — Emergency Department (HOSPITAL_COMMUNITY): Payer: Medicare Other

## 2016-02-06 ENCOUNTER — Encounter (HOSPITAL_COMMUNITY): Payer: Self-pay | Admitting: Emergency Medicine

## 2016-02-06 ENCOUNTER — Emergency Department (HOSPITAL_COMMUNITY)
Admission: EM | Admit: 2016-02-06 | Discharge: 2016-02-06 | Disposition: A | Discharge status: Home / Self Care | Payer: Medicare Other | Attending: Emergency Medicine | Admitting: Emergency Medicine

## 2016-02-06 DIAGNOSIS — R0902 Hypoxemia: Secondary | ICD-10-CM | POA: Diagnosis not present

## 2016-02-06 DIAGNOSIS — Z7982 Long term (current) use of aspirin: Secondary | ICD-10-CM | POA: Insufficient documentation

## 2016-02-06 DIAGNOSIS — I160 Hypertensive urgency: Secondary | ICD-10-CM | POA: Insufficient documentation

## 2016-02-06 DIAGNOSIS — E119 Type 2 diabetes mellitus without complications: Secondary | ICD-10-CM | POA: Diagnosis not present

## 2016-02-06 DIAGNOSIS — R791 Abnormal coagulation profile: Secondary | ICD-10-CM | POA: Diagnosis not present

## 2016-02-06 DIAGNOSIS — Z87891 Personal history of nicotine dependence: Secondary | ICD-10-CM | POA: Diagnosis not present

## 2016-02-06 DIAGNOSIS — R4701 Aphasia: Secondary | ICD-10-CM | POA: Diagnosis not present

## 2016-02-06 DIAGNOSIS — R509 Fever, unspecified: Secondary | ICD-10-CM | POA: Diagnosis not present

## 2016-02-06 DIAGNOSIS — R4781 Slurred speech: Secondary | ICD-10-CM | POA: Diagnosis not present

## 2016-02-06 DIAGNOSIS — Z7984 Long term (current) use of oral hypoglycemic drugs: Secondary | ICD-10-CM | POA: Insufficient documentation

## 2016-02-06 DIAGNOSIS — R5383 Other fatigue: Secondary | ICD-10-CM | POA: Diagnosis present

## 2016-02-06 DIAGNOSIS — Z794 Long term (current) use of insulin: Secondary | ICD-10-CM | POA: Insufficient documentation

## 2016-02-06 DIAGNOSIS — R531 Weakness: Secondary | ICD-10-CM | POA: Diagnosis not present

## 2016-02-06 DIAGNOSIS — I6523 Occlusion and stenosis of bilateral carotid arteries: Secondary | ICD-10-CM | POA: Diagnosis not present

## 2016-02-06 DIAGNOSIS — I6503 Occlusion and stenosis of bilateral vertebral arteries: Secondary | ICD-10-CM | POA: Diagnosis not present

## 2016-02-06 LAB — DIFFERENTIAL
Basophils Absolute: 0 10*3/uL (ref 0.0–0.1)
Basophils Relative: 0 %
EOS PCT: 0 %
Eosinophils Absolute: 0 10*3/uL (ref 0.0–0.7)
LYMPHS ABS: 1.1 10*3/uL (ref 0.7–4.0)
LYMPHS PCT: 20 %
MONO ABS: 0.4 10*3/uL (ref 0.1–1.0)
MONOS PCT: 8 %
NEUTROS ABS: 4 10*3/uL (ref 1.7–7.7)
Neutrophils Relative %: 72 %

## 2016-02-06 LAB — COMPREHENSIVE METABOLIC PANEL
ALK PHOS: 102 U/L (ref 38–126)
ALT: 23 U/L (ref 14–54)
ANION GAP: 9 (ref 5–15)
AST: 30 U/L (ref 15–41)
Albumin: 3.1 g/dL — ABNORMAL LOW (ref 3.5–5.0)
BILIRUBIN TOTAL: 0.5 mg/dL (ref 0.3–1.2)
BUN: 8 mg/dL (ref 6–20)
CALCIUM: 8.7 mg/dL — AB (ref 8.9–10.3)
CO2: 26 mmol/L (ref 22–32)
CREATININE: 0.84 mg/dL (ref 0.44–1.00)
Chloride: 97 mmol/L — ABNORMAL LOW (ref 101–111)
Glucose, Bld: 239 mg/dL — ABNORMAL HIGH (ref 65–99)
Potassium: 4 mmol/L (ref 3.5–5.1)
Sodium: 132 mmol/L — ABNORMAL LOW (ref 135–145)
TOTAL PROTEIN: 6.7 g/dL (ref 6.5–8.1)

## 2016-02-06 LAB — I-STAT CHEM 8, ED
BUN: 10 mg/dL (ref 6–20)
CALCIUM ION: 1.04 mmol/L — AB (ref 1.15–1.40)
CREATININE: 0.8 mg/dL (ref 0.44–1.00)
Chloride: 96 mmol/L — ABNORMAL LOW (ref 101–111)
GLUCOSE: 235 mg/dL — AB (ref 65–99)
HCT: 42 % (ref 36.0–46.0)
HEMOGLOBIN: 14.3 g/dL (ref 12.0–15.0)
POTASSIUM: 4 mmol/L (ref 3.5–5.1)
Sodium: 134 mmol/L — ABNORMAL LOW (ref 135–145)
TCO2: 27 mmol/L (ref 0–100)

## 2016-02-06 LAB — URINALYSIS, ROUTINE W REFLEX MICROSCOPIC
BILIRUBIN URINE: NEGATIVE
GLUCOSE, UA: NEGATIVE mg/dL
HGB URINE DIPSTICK: NEGATIVE
Ketones, ur: NEGATIVE mg/dL
Leukocytes, UA: NEGATIVE
Nitrite: NEGATIVE
PROTEIN: NEGATIVE mg/dL
Specific Gravity, Urine: 1.018 (ref 1.005–1.030)
pH: 7.5 (ref 5.0–8.0)

## 2016-02-06 LAB — APTT: aPTT: 29 seconds (ref 24–36)

## 2016-02-06 LAB — I-STAT TROPONIN, ED: TROPONIN I, POC: 0.01 ng/mL (ref 0.00–0.08)

## 2016-02-06 LAB — CBC
HEMATOCRIT: 40.3 % (ref 36.0–46.0)
Hemoglobin: 13 g/dL (ref 12.0–15.0)
MCH: 27.8 pg (ref 26.0–34.0)
MCHC: 32.3 g/dL (ref 30.0–36.0)
MCV: 86.1 fL (ref 78.0–100.0)
Platelets: 149 10*3/uL — ABNORMAL LOW (ref 150–400)
RBC: 4.68 MIL/uL (ref 3.87–5.11)
RDW: 13.8 % (ref 11.5–15.5)
WBC: 5.6 10*3/uL (ref 4.0–10.5)

## 2016-02-06 LAB — PROTIME-INR
INR: 1.07
Prothrombin Time: 13.9 seconds (ref 11.4–15.2)

## 2016-02-06 LAB — CBG MONITORING, ED: Glucose-Capillary: 247 mg/dL — ABNORMAL HIGH (ref 65–99)

## 2016-02-06 LAB — I-STAT CG4 LACTIC ACID, ED: Lactic Acid, Venous: 1.46 mmol/L (ref 0.5–1.9)

## 2016-02-06 MED ORDER — IOPAMIDOL (ISOVUE-370) INJECTION 76%
INTRAVENOUS | Status: AC
  Filled 2016-02-06: qty 100

## 2016-02-06 MED ORDER — ACETAMINOPHEN 650 MG RE SUPP
650.0000 mg | Freq: Once | RECTAL | Status: AC
  Administered 2016-02-06: 650 mg via RECTAL
  Filled 2016-02-06: qty 1

## 2016-02-06 MED ORDER — ASPIRIN EC 325 MG PO TBEC: 325.0000 mg | DELAYED_RELEASE_TABLET | Freq: Every day | ORAL | 0 refills | Status: AC

## 2016-02-06 MED ORDER — IOPAMIDOL (ISOVUE-370) INJECTION 76%
INTRAVENOUS | Status: AC
  Administered 2016-02-06: 50 mL
  Filled 2016-02-06: qty 100

## 2016-02-06 NOTE — ED Notes (Signed)
Patient on way to MRI

## 2016-02-06 NOTE — ED Notes (Signed)
MRI called and said that they are really busy at this time so if patient ends up being admitted, they will get her from floor but if not, they will be here as soon as possible.

## 2016-02-06 NOTE — ED Triage Notes (Signed)
Per EMS, patient is from [place].  She was woke up this morning for breakfast and the employees of [facility] noticed patient slurring speech and weak.  Negative on stroke scale.  BP 166/65, 90s HR, CBG 189, Clear lung sounds bilaterally, No complaints.  20G IV in Left forearm.  HX of dementia, dysphagia, Diabetes. Not on any blood thinners.  No recent falls.  Answers questions appropriately.  Does stare off in space at times.  New onset of tongue quivering.

## 2016-02-06 NOTE — ED Notes (Signed)
Patient's daughter here at bedside.  She states that her mother has slurred speech often and confused at times due to dementia.  Daughter states that the symptoms at this time are normal for her mother.  Mouth quivering is normal for patient.  Daughter states that the only thing that that the nursing home said that was abnormal was her blood pressure this morning.

## 2016-02-06 NOTE — ED Notes (Signed)
[facility] notified for transportation back home

## 2016-02-06 NOTE — ED Notes (Signed)
[facility] here to transport patient to [place].

## 2016-02-06 NOTE — ED Provider Notes (Signed)
[facility] DEPT Provider Note   CSN: [id number] Arrival date & time: 02/06/16  [id number]   LEVEL 5 CAVEAT - DEMENTIA  History   Chief Complaint Chief Complaint  Patient presents with  . Aphasia  . Fatigue    HPI Debra Tran is a 82 y.o. female.  HPI  82 year old female brought in by EMS for concern for a possible stroke. History is limited as the patient has dementia and does not exactly remember what happened. Daughter and nursing home help provide history. The nursing home states that they evaluated patient and noted her to be flushed and "not looking well". She was not able to get words out. Her blood pressure was 210/90. EMS was called. They also noted that the patient was having fasciculations of the tongue and lip. The daughter states that over the last 2 years she has had progressive dementia and has intermittent tongue and lip quivering and this is not new. She also has trouble speaking which is not new. Daughter is most concerned about the blood pressure. At this point the patient seems to be at her baseline per the daughter. Patient has not had any of her meds this morning. Patient has no current complaints. Patient is typically in the wheelchair. No prior TIA/CVA per daughter.  Past Medical History:  Diagnosis Date  . Allergic rhinitis   . CATARACT 06/06/2006   Qualifier: Diagnosis of  By: Genene Churn MD, Janett Billow    . Depressive disorder   . Diabetes mellitus type 2 in obese ([place])   . Fungal dermatitis   . Hyperlipidemia   . Hypertension   . Macular degeneration   . Mild cognitive impairment   . Narcissism   . Normocytic anemia   . Phobia   . Schizophrenia, paranoid ([place])   . Stress incontinence, female     Patient Active Problem List   Diagnosis Date Noted  . Nonmelanoma skin cancer 07/18/2015  . Essential hypertension, benign 02/24/2015  . Stress incontinence 11/29/2014  . Tardive dyskinesia 09/15/2014  . Chronic constipation 07/03/2014  . GERD  (gastroesophageal reflux disease) 05/11/2014  . Depressive disorder   . Dementia without behavioral disturbance 12/12/2011  . ARTHRITIS 09/20/2009  . Controlled diabetes mellitus type II without complication ([place]) [id number]  . Hyperlipidemia associated with type 2 diabetes mellitus ([place]) 06/06/2006  . Schizophrenia ([place]) 06/06/2006  . Allergic rhinitis 06/06/2006    Past Surgical History:  Procedure Laterality Date  . ABDOMINAL HYSTERECTOMY     due to fibroids  . CATARACT EXTRACTION  6 16 2006  . CRYOTHERAPY  2 15 2006   facial AK  . hyperplastic   4 20 2006   AK shave biopsy  . intraocular injection  [id number]  . TOTAL ABDOMINAL HYSTERECTOMY W/ BILATERAL SALPINGOOPHORECTOMY     due to endometriosis  non malignant    OB History    No data available       Home Medications    Prior to Admission medications   Medication Sig Start Date End Date Taking? Authorizing Provider  acetaminophen (TYLENOL) 325 MG tablet Take 650 mg by mouth every 4 (four) hours as needed.   Yes Historical Provider, MD  atorvastatin (LIPITOR) 20 MG tablet Take 1 tablet (20 mg total) by mouth at bedtime. 08/24/11  Yes Clovis Cao, MD  benztropine (COGENTIN) 1 MG tablet Take 1 mg by mouth at bedtime.   Yes Historical Provider, MD  Calcium Carbonate-Vitamin D (CALCIUM 600-D) 600-400 MG-UNIT per tablet Take 1 tablet by  mouth every morning.    Yes Historical Provider, MD  cetirizine (ZYRTEC) 10 MG tablet Take 10 mg by mouth daily.   Yes Historical Provider, MD  cholecalciferol (VITAMIN D) 1000 UNITS tablet Take 1,000 Units by mouth daily.   Yes Historical Provider, MD  cyanocobalamin 1000 MCG tablet Take 100 mcg by mouth daily.   Yes Historical Provider, MD  docusate sodium (COLACE) 100 MG capsule Take 1 capsule (100 mg total) by mouth daily. For constipation 09/15/14  Yes Gerlene Fee, NP  donepezil (ARICEPT) 10 MG tablet Take 10 mg by mouth at bedtime.   Yes Historical Provider, MD  escitalopram (LEXAPRO)  10 MG tablet Take 20 mg by mouth at bedtime. For depression 07/26/14  Yes Clovis Cao, MD  glipiZIDE (GLUCOTROL) 10 MG tablet Take 5 mg by mouth 2 (two) times daily before a meal.    Yes Historical Provider, MD  Hypromellose 0.4 % SOLN Place 1 drop into both eyes 3 (three) times daily.   Yes Historical Provider, MD  insulin aspart (NOVOLOG) 100 UNIT/ML injection Inject 5 Units into the skin 3 (three) times daily before meals.   Yes Historical Provider, MD  Insulin Detemir (LEVEMIR FLEXPEN) 100 UNIT/ML Pen Inject 5 Units into the skin at bedtime.   Yes Historical Provider, MD  Ketotifen Fumarate (ZADITOR OP) Place 1 drop into both eyes 2 (two) times daily.   Yes Historical Provider, MD  Memantine HCl ER (NAMENDA XR) 28 MG CP24 Take 28 mg by mouth daily. For dementia   Yes Historical Provider, MD  Multiple Vitamin (MULTIVITAMIN WITH MINERALS) TABS Take 1 tablet by mouth every morning.   Yes Historical Provider, MD  nortriptyline (PAMELOR) 10 MG capsule Take 10 mg by mouth at bedtime.   Yes Historical Provider, MD  Nutritional Supplements (NUTRITIONAL SUPPLEMENT PO) Take by mouth. HSG Puree texture, Nectar consistency   Yes Historical Provider, MD  OLANZapine (ZYPREXA) 5 MG tablet Take 10 mg by mouth at bedtime.  06/25/12  Yes Clovis Cao, MD  Omega-3 Fatty Acids (FISH OIL) 1000 MG CAPS Take 1,000 mg by mouth daily.   Yes Historical Provider, MD  omeprazole (PRILOSEC) 20 MG capsule Take 20 mg by mouth daily.   Yes Historical Provider, MD  Protein POWD Take 2 scoop by mouth 2 (two) times daily.   Yes Historical Provider, MD  triamcinolone (KENALOG) 0.025 % ointment Apply 1 application topically daily. Apply to arms for skin integrity   Yes Historical Provider, MD  aspirin EC 325 MG tablet Take 1 tablet (325 mg total) by mouth daily. 02/06/16   Sherwood Gambler, MD  neomycin-polymyxin b-dexamethasone (MAXITROL) 3.5-10000-0.1 OINT Place 1 application into the right eye 3 (three) times daily.    Historical  Provider, MD    Family History Family History  Problem Relation Age of Onset  . Heart disease Mother   . Stroke Mother   . Heart disease Father   . Stroke Father   . Cancer Cousin     liver and colon    Social History Social History  Substance Use Topics  . Smoking status: Former Research scientist (life sciences)  . Smokeless tobacco: Never Used  . Alcohol use No     Allergies   Ether   Review of Systems Review of Systems  Unable to perform ROS: Dementia     Physical Exam Updated Vital Signs BP (!) 134/51 (BP Location: Right Arm)   Pulse 82   Temp (S) 100.8 F (38.2 C) (Oral)  Resp 18   Ht 5\' 6"  (1.676 m)   Wt 180 lb (81.6 kg)   SpO2 94%   BMI 29.05 kg/m   Physical Exam  Constitutional: She appears well-developed and well-nourished.  HENT:  Head: Normocephalic and atraumatic.  Right Ear: External ear normal.  Left Ear: External ear normal.  Nose: Nose normal.  Intermittent tongue and lip fasiculations  Eyes: Right eye exhibits no discharge. Left eye exhibits no discharge.  Cardiovascular: Normal rate, regular rhythm and normal heart sounds.   Pulmonary/Chest: Effort normal and breath sounds normal.  Abdominal: Soft. There is no tenderness.  Neurological: She is alert.  Alert, oriented to self. Clear speech. No facial droop. 5/5 strength in BUE. Equal strength in BLE.  Skin: Skin is warm and dry.  Nursing note and vitals reviewed.    ED Treatments / Results  Labs (all labs ordered are listed, but only abnormal results are displayed) Labs Reviewed  CBC - Abnormal; Notable for the following:       Result Value   Platelets 149 (*)    All other components within normal limits  COMPREHENSIVE METABOLIC PANEL - Abnormal; Notable for the following:    Sodium 132 (*)    Chloride 97 (*)    Glucose, Bld 239 (*)    Calcium 8.7 (*)    Albumin 3.1 (*)    All other components within normal limits  CBG MONITORING, ED - Abnormal; Notable for the following:    Glucose-Capillary  247 (*)    All other components within normal limits  I-STAT CHEM 8, ED - Abnormal; Notable for the following:    Sodium 134 (*)    Chloride 96 (*)    Glucose, Bld 235 (*)    Calcium, Ion 1.04 (*)    All other components within normal limits  CULTURE, BLOOD (ROUTINE X 2)  CULTURE, BLOOD (ROUTINE X 2)  PROTIME-INR  APTT  DIFFERENTIAL  URINALYSIS, ROUTINE W REFLEX MICROSCOPIC (NOT AT [facility])  I-STAT TROPOININ, ED  I-STAT CG4 LACTIC ACID, ED    EKG  EKG Interpretation  Date/Time:  Monday February 06 2016 08:17:34 EDT Ventricular Rate:  95 PR Interval:    QRS Duration: 128 QT Interval:  397 QTC Calculation: 500 R Axis:   -69 Text Interpretation:  Sinus rhythm Prolonged PR interval RBBB and LAFB Probable left ventricular hypertrophy Baseline wander in lead(s) III new conduction delay since 2014 Confirmed by Kenzi Bardwell MD, Aydenn Gervin [phone]) on 02/06/2016 8:27:33 AM Also confirmed by Regenia Skeeter MD, Welcome Fults [phone]), editor [place], Joelene Millin [phone])  on 02/06/2016 12:56:18 PM       Radiology Ct Angio Head W Or Wo Contrast  Result Date: 02/06/2016 CLINICAL DATA:  82 year old female with slurred speech. Subsequent encounter. EXAM: CT ANGIOGRAPHY HEAD AND NECK TECHNIQUE: Multidetector CT imaging of the head and neck was performed using the standard protocol during bolus administration of intravenous contrast. Multiplanar CT image reconstructions and MIPs were obtained to evaluate the vascular anatomy. Carotid stenosis measurements (when applicable) are obtained utilizing NASCET criteria, using the distal internal carotid diameter as the denominator. CONTRAST:  50 cc Isovue 370. COMPARISON:  02/06/2016 brain MR and head CT. FINDINGS: CT HEAD FINDINGS Brain: No intracranial hemorrhage or CT evidence of large acute infarct. Chronic microvascular changes. No intracranial mass abnormal enhancement. Global atrophy without hydrocephalus. Vascular: As below. Skull: Negative. Sinuses: Minimal mucosal thickening  sphenoid sinuses. Orbits: No acute post lens replacement. Review of the MIP images confirms the above findings CTA NECK FINDINGS Aortic  arch: 3 vessel arch with calcification. Right carotid system: Plaque right carotid bifurcation and proximal right internal carotid artery with less than 50% diameter narrowing. Left carotid system: Mild plaque proximal left internal carotid artery with less than 50% diameter narrowing. Vertebral arteries: Plaque with mild narrowing origin of vertebral arteries bilaterally. Left vertebral artery slightly dominant size. Skeleton: Cervical spondylotic changes with mild curvature cervical spine convex right and superimposed mild degenerative changes. Other neck: No worrisome neck mass. Upper chest: No worrisome lung lesion. Coronary artery calcification. Top-normal size fatty containing mediastinal lymph nodes. Circumferential thickening of the esophagus may be related to underdistention. This spans over long segment. Review of the MIP images confirms the above findings CTA HEAD FINDINGS Anterior circulation: Plaque with mild narrowing cavernous segment internal carotid artery bilaterally. Anterior circulation without medium or large size vessel significant stenosis or occlusion. Posterior circulation: Mild narrowing distal right vertebral artery. No significant stenosis left vertebral artery basilar artery. Venous sinuses: Patent. Anatomic variants: Negative. Delayed phase: Negative. Review of the MIP images confirms the above findings IMPRESSION: CT HEAD No intracranial hemorrhage or CT evidence of large acute infarct. Chronic microvascular changes. No intracranial mass abnormal enhancement. Global atrophy without hydrocephalus. CTA NECK Plaque right carotid bifurcation and proximal right internal carotid artery with less than 50% diameter narrowing. Mild plaque proximal left internal carotid artery with less than 50% diameter narrowing. Plaque with mild narrowing origin of vertebral  arteries bilaterally. Left vertebral artery slightly dominant size. CTA HEAD Plaque with mild narrowing cavernous segment internal carotid artery bilaterally. Anterior circulation without medium or large size vessel significant stenosis or occlusion. Mild narrowing distal right vertebral artery. No significant stenosis left vertebral artery or basilar artery. Electronically Signed   By: Genia Del M.D.   On: 02/06/2016 14:37   Dg Chest 2 View  Result Date: 02/06/2016 CLINICAL DATA:  Slurred speech and weakness this morning. EXAM: CHEST  2 VIEW COMPARISON:  Chest radiograph Aug 14, 2015 FINDINGS: Cardiomediastinal silhouette is normal. Low lung volumes with crowded vascular markings. No pleural effusions or focal consolidations. Trachea projects midline and there is no pneumothorax. Soft tissue planes and included osseous structures are non-suspicious. IMPRESSION: No active cardiopulmonary disease. Electronically Signed   By: Elon Alas M.D.   On: 02/06/2016 14:34   Ct Head Wo Contrast  Result Date: 02/06/2016 CLINICAL DATA:  Generalized weakness and slurred speech beginning earlier today. Stroke risk factors include hypertension and diabetes. EXAM: CT HEAD WITHOUT CONTRAST TECHNIQUE: Contiguous axial images were obtained from the base of the skull through the vertex without intravenous contrast. COMPARISON:  08/14/2014. FINDINGS: Brain: No evidence of acute infarction, hemorrhage, hydrocephalus, extra-axial collection or mass lesion/mass effect. Moderate atrophy, not unexpected for age. Hypoattenuation of white matter representing chronic microvascular ischemic change most likely. Vascular: Proximal vascular calcification, without CT signs of large vessel occlusion. Skull: Calvarium intact. Sinuses/Orbits:  No layering fluid or orbital masses. Other: None. IMPRESSION: Atrophy and small vessel disease, similar to priors. No acute intracranial findings. Electronically Signed   By: Staci Righter M.D.    On: 02/06/2016 09:52   Ct Angio Neck W And/or Wo Contrast  Result Date: 02/06/2016 CLINICAL DATA:  82 year old female with slurred speech. Subsequent encounter. EXAM: CT ANGIOGRAPHY HEAD AND NECK TECHNIQUE: Multidetector CT imaging of the head and neck was performed using the standard protocol during bolus administration of intravenous contrast. Multiplanar CT image reconstructions and MIPs were obtained to evaluate the vascular anatomy. Carotid stenosis measurements (when applicable) are obtained utilizing  NASCET criteria, using the distal internal carotid diameter as the denominator. CONTRAST:  50 cc Isovue 370. COMPARISON:  02/06/2016 brain MR and head CT. FINDINGS: CT HEAD FINDINGS Brain: No intracranial hemorrhage or CT evidence of large acute infarct. Chronic microvascular changes. No intracranial mass abnormal enhancement. Global atrophy without hydrocephalus. Vascular: As below. Skull: Negative. Sinuses: Minimal mucosal thickening sphenoid sinuses. Orbits: No acute post lens replacement. Review of the MIP images confirms the above findings CTA NECK FINDINGS Aortic arch: 3 vessel arch with calcification. Right carotid system: Plaque right carotid bifurcation and proximal right internal carotid artery with less than 50% diameter narrowing. Left carotid system: Mild plaque proximal left internal carotid artery with less than 50% diameter narrowing. Vertebral arteries: Plaque with mild narrowing origin of vertebral arteries bilaterally. Left vertebral artery slightly dominant size. Skeleton: Cervical spondylotic changes with mild curvature cervical spine convex right and superimposed mild degenerative changes. Other neck: No worrisome neck mass. Upper chest: No worrisome lung lesion. Coronary artery calcification. Top-normal size fatty containing mediastinal lymph nodes. Circumferential thickening of the esophagus may be related to underdistention. This spans over long segment. Review of the MIP images  confirms the above findings CTA HEAD FINDINGS Anterior circulation: Plaque with mild narrowing cavernous segment internal carotid artery bilaterally. Anterior circulation without medium or large size vessel significant stenosis or occlusion. Posterior circulation: Mild narrowing distal right vertebral artery. No significant stenosis left vertebral artery basilar artery. Venous sinuses: Patent. Anatomic variants: Negative. Delayed phase: Negative. Review of the MIP images confirms the above findings IMPRESSION: CT HEAD No intracranial hemorrhage or CT evidence of large acute infarct. Chronic microvascular changes. No intracranial mass abnormal enhancement. Global atrophy without hydrocephalus. CTA NECK Plaque right carotid bifurcation and proximal right internal carotid artery with less than 50% diameter narrowing. Mild plaque proximal left internal carotid artery with less than 50% diameter narrowing. Plaque with mild narrowing origin of vertebral arteries bilaterally. Left vertebral artery slightly dominant size. CTA HEAD Plaque with mild narrowing cavernous segment internal carotid artery bilaterally. Anterior circulation without medium or large size vessel significant stenosis or occlusion. Mild narrowing distal right vertebral artery. No significant stenosis left vertebral artery or basilar artery. Electronically Signed   By: Genia Del M.D.   On: 02/06/2016 14:37   Mr Brain Wo Contrast (neuro Protocol)  Result Date: 02/06/2016 CLINICAL DATA:  Slurred speech and weakness developed earlier today. Schizophrenia. Cognitive impairment. Hypertension and diabetes. EXAM: MRI HEAD WITHOUT CONTRAST TECHNIQUE: Multiplanar, multiecho pulse sequences of the brain and surrounding structures were obtained without intravenous contrast. COMPARISON:  CT head earlier today. Also multiple prior CT head scans, including 09/30/2009. FINDINGS: The study is motion degraded but diagnostic for the exclusion of stroke. Brain: No  evidence for acute infarction, acute hemorrhage, mass lesion, or extra-axial fluid. Global atrophy, hydrocephalus ex vacuo. Extensive T2 and FLAIR hyperintensities throughout the white matter consistent with chronic microvascular ischemic change. Vascular: Normal flow voids. Skull and upper cervical spine: Normal marrow signal. Sinuses/Orbits: BILATERAL cataract extraction.  No layering fluid. Other: Small mastoid effusions, without nasopharyngeal mass, non worrisome. Compared with scans since 2011 and 2012, atrophy has progressed. IMPRESSION: Atrophy and small vessel disease, similar to priors. No acute stroke is evident. Electronically Signed   By: Staci Righter M.D.   On: 02/06/2016 12:41    Procedures Procedures (including critical care time)  Medications Ordered in ED Medications  iopamidol (ISOVUE-370) 76 % injection (not administered)  iopamidol (ISOVUE-370) 76 % injection (50 mLs  Contrast Given 02/06/16  1345)  acetaminophen (TYLENOL) suppository 650 mg (650 mg Rectal Given 02/06/16 1329)     Initial Impression / Assessment and Plan / ED Course  I have reviewed the triage vital signs and the nursing notes.  Pertinent labs & imaging results that were available during my care of the patient were reviewed by me and considered in my medical decision making (see chart for details).  Clinical Course  Comment By Time  Unclear if this was all just a hypertensive episode or if there was a true TIA. Is at baseline now per daughter. Will consult neuro to assist with workup as this is not straightforward. Sherwood Gambler, MD 10/30 [phone]  Dr. Hollice Gong recommends ED w/u for TIA including CTA head/neck and MRI. If negative, full ASA and outpatient f/u Sherwood Gambler, MD 10/30 1006  Patient has developed a fever of 101. Will add lactate, urine, chest xray. Tylenol suppository Sherwood Gambler, MD 10/30 1317  Unclear why she has a fever. However her TIA workup in ED is unremarkable. Still appears at  baseline. A PA/MD team rounds tomorrow so she can get checked tomorrow. No indication for hospitalization at this point. Discussed strict return precautions Sherwood Gambler, MD 10/30 1525    Final Clinical Impressions(s) / ED Diagnoses   Final diagnoses:  Hypertensive urgency  Fever in adult    New Prescriptions New Prescriptions   ASPIRIN EC 325 MG TABLET    Take 1 tablet (325 mg total) by mouth daily.     Sherwood Gambler, MD 02/06/16 [phone]

## 2016-02-07 ENCOUNTER — Encounter: Payer: Self-pay | Admitting: Adult Health

## 2016-02-07 ENCOUNTER — Non-Acute Institutional Stay (SKILLED_NURSING_FACILITY): Payer: Medicare Other | Admitting: Adult Health

## 2016-02-07 DIAGNOSIS — F0390 Unspecified dementia without behavioral disturbance: Secondary | ICD-10-CM

## 2016-02-07 DIAGNOSIS — K219 Gastro-esophageal reflux disease without esophagitis: Secondary | ICD-10-CM | POA: Diagnosis not present

## 2016-02-07 NOTE — Progress Notes (Signed)
Patient ID: Debra Tran, female   DOB: 1927/05/31, 82 y.o.   MRN: [id number]    Location:   [place] Room Number: 225-B Place of Service:  SNF (31)   CODE STATUS: Full Code  Allergies  Allergen Reactions  . Ether Nausea And Vomiting    Chief Complaint  Patient presents with  . Acute Visit    Cough    HPI:  She had gone to the ED yesterday for a possible cva with an elevated blood pressure and slurred speech.  Her daughter  Reported to the ED that her speech slurring is not new. Today she is able to speak clearly but has to think about what to say.  She does tell me that she has an occasional cough; but denies any congestion present. There are no reports of fever present.   Past Medical History:  Diagnosis Date  . Allergic rhinitis   . CATARACT 06/06/2006   Qualifier: Diagnosis of  By: Genene Churn MD, Janett Billow    . Depressive disorder   . Diabetes mellitus type 2 in obese ([place])   . Fungal dermatitis   . Hyperlipidemia   . Hypertension   . Macular degeneration   . Mild cognitive impairment   . Narcissism   . Normocytic anemia   . Phobia   . Schizophrenia, paranoid ([place])   . Stress incontinence, female     Past Surgical History:  Procedure Laterality Date  . ABDOMINAL HYSTERECTOMY     due to fibroids  . CATARACT EXTRACTION  6 16 2006  . CRYOTHERAPY  2 15 2006   facial AK  . hyperplastic   4 20 2006   AK shave biopsy  . intraocular injection  [id number]  . TOTAL ABDOMINAL HYSTERECTOMY W/ BILATERAL SALPINGOOPHORECTOMY     due to endometriosis  non malignant    Social History   Social History  . Marital status: Divorced    Spouse name: N/A  . Number of children: N/A  . Years of education: N/A   Occupational History  . Not on file.   Social History Main Topics  . Smoking status: Former Research scientist (life sciences)  . Smokeless tobacco: Never Used  . Alcohol use No  . Drug use: No  . Sexual activity: Not on file   Other Topics Concern  . Not on file   Social  History Narrative   Lives in [facility] (assisted living), remotely smoked and chewed tobacco. Daughter([name])  drives her, independent of all ADL's   Family History  Problem Relation Age of Onset  . Heart disease Mother   . Stroke Mother   . Heart disease Father   . Stroke Father   . Cancer Cousin     liver and colon      VITAL SIGNS BP (!) 152/68   Pulse 70   Temp 97.3 F (36.3 C) (Oral)   Resp 18   Ht 4\' 9"  (1.448 m)   Wt 178 lb 6 oz (80.9 kg)   SpO2 98%   BMI 38.60 kg/m   Patient's Medications  New Prescriptions   No medications on file  Previous Medications   ACETAMINOPHEN (TYLENOL) 325 MG TABLET    Take 650 mg by mouth every 4 (four) hours as needed.   ASPIRIN EC 325 MG TABLET    Take 1 tablet (325 mg total) by mouth daily.   ATORVASTATIN (LIPITOR) 20 MG TABLET    Take 1 tablet (20 mg total) by mouth at bedtime.  BENZTROPINE (COGENTIN) 1 MG TABLET    Take 1 mg by mouth at bedtime.   CALCIUM CARBONATE-VITAMIN D (CALCIUM 600-D) 600-400 MG-UNIT PER TABLET    Take 1 tablet by mouth every morning.    CETIRIZINE (ZYRTEC) 10 MG TABLET    Take 10 mg by mouth daily.   CHOLECALCIFEROL (VITAMIN D) 1000 UNITS TABLET    Take 1,000 Units by mouth daily.   CYANOCOBALAMIN 1000 MCG TABLET    Take 100 mcg by mouth daily.   DOCUSATE SODIUM (COLACE) 100 MG CAPSULE    Take 1 capsule (100 mg total) by mouth daily. For constipation   DONEPEZIL (ARICEPT) 10 MG TABLET    Take 10 mg by mouth at bedtime.   ESCITALOPRAM (LEXAPRO) 10 MG TABLET    Take 20 mg by mouth at bedtime. For depression   GLIPIZIDE (GLUCOTROL) 10 MG TABLET    Take 5 mg by mouth 2 (two) times daily before a meal.    HYPROMELLOSE 0.4 % SOLN    Place 1 drop into both eyes 3 (three) times daily.   INSULIN ASPART (NOVOLOG) 100 UNIT/ML INJECTION    Inject 5 Units into the skin 3 (three) times daily before meals.   INSULIN DETEMIR (LEVEMIR FLEXPEN) 100 UNIT/ML PEN    Inject 5 Units into the skin at bedtime.   KETOTIFEN  FUMARATE (ZADITOR OP)    Place 1 drop into both eyes 2 (two) times daily.   MEMANTINE HCL ER (NAMENDA XR) 28 MG CP24    Take 28 mg by mouth daily. For dementia   MULTIPLE VITAMIN (MULTIVITAMIN WITH MINERALS) TABS    Take 1 tablet by mouth every morning.   NEOMYCIN-POLYMYXIN B-DEXAMETHASONE (MAXITROL) 3.5-10000-0.1 OINT    Place 1 application into the right eye 3 (three) times daily.   NUTRITIONAL SUPPLEMENTS (NUTRITIONAL SUPPLEMENT PO)    Take by mouth. HSG Puree texture, Nectar consistency   OLANZAPINE (ZYPREXA) 5 MG TABLET    Take 10 mg by mouth at bedtime.    OMEGA-3 FATTY ACIDS (FISH OIL) 1000 MG CAPS    Take 1,000 mg by mouth daily.   OMEPRAZOLE (PRILOSEC) 20 MG CAPSULE    Take 20 mg by mouth daily.   PROTEIN POWD    Take 2 scoop by mouth 2 (two) times daily.   TRIAMCINOLONE (KENALOG) 0.025 % OINTMENT    Apply 1 application topically daily. Apply to arms for skin integrity  Modified Medications   No medications on file  Discontinued Medications   NORTRIPTYLINE (PAMELOR) 10 MG CAPSULE    Take 10 mg by mouth at bedtime.     SIGNIFICANT DIAGNOSTIC EXAMS  03-19-14: ct of chest: No parenchymal mass is noted within the lungs. No acute abnormality Noted.  08-19-14: mammogram: scattered fibroglandular tissue   12-30-14: right eye intraocular injection   02-06-16: ct of head: Atrophy and small vessel disease, similar to priors. No acute intracranial findings.  02-06-16: mri of brain: Atrophy and small vessel disease, similar to priors. No acute stroke is evident.  02-06-16: ct angio of head and neck: CT HEADNo intracranial hemorrhage or CT evidence of large acute infarct. Chronic microvascular changes. No intracranial mass abnormal enhancement. Global atrophy without hydrocephalus.   CTA NECK Plaque right carotid bifurcation and proximal right internal carotid artery with less than 50% diameter narrowing. Mild plaque proximal left internal carotid artery with less than 50% diameter  narrowing. Plaque with mild narrowing origin of vertebral arteries bilaterally. Left vertebral artery slightly dominant size.  CTA HEAD Plaque with mild narrowing cavernous segment internal carotid artery bilaterally. Anterior circulation without medium or large size vessel significant stenosis or occlusion. Mild narrowing distal right vertebral artery. No significant stenosis left vertebral artery or basilar artery.      LABS REVIEWED:  01-10-15: glucose 200; bun 14.0; creat 0.87; k+ 4.2; na++ 143; liver normal albumin 3.5; hgb a1c 6.7  Chol 147; ldl 54; trig 248; hdl 44  02-25-15: urine micro-albumin <1.2  07-12-15: wbc 8.2; hgb 12.7; hct 38.1; mcv 84.2; plt 229; glucose 94; bun 13.0; creat 0.72; k+ 4.4; na++ 141; liver normal albumin 3.4; hgb a1c 6.4; chol 135; ldl 64; trig 130; hdl 45  12-09-15: glucose 256; bun 14.6; creat 1.01; k+ 4.5; na++ 137; liver normal albumin 3.5; chol 156; ldl 61; trig 246; hdl 45  02-06-16: wbc 5.6; hgb 13.0; hct 40.3; mcv 86.1; plt 149; glucose 239; bun 8; creat 0.84; k+ 4.0; na++ 132; liver normal albumin 3.1; blood culture: no growth; UA: neg     Review of Systems Constitutional: Negative for malaise/fatigue.  Respiratory: Negative shortness of breath.  has cough  Cardiovascular: Negative for chest pain, palpitations and leg swelling.  Gastrointestinal: Negative for heartburn, abdominal pain and constipation.  Musculoskeletal: Negative for myalgias and joint pain.  Skin: Negative.   Neurological: Negative for headaches.  Psychiatric/Behavioral: The patient is not nervous/anxious.       Physical Exam Constitutional: No distress.  Overweight   Neck: Neck supple. No JVD present. No thyromegaly present.  Cardiovascular: Normal rate, regular rhythm and intact distal pulses.   Respiratory: Effort normal and breath sounds normal. No respiratory distress.  GI: Soft. Bowel sounds are normal. She exhibits no distension. There is no tenderness.    Musculoskeletal: She exhibits no edema.  Is able to move all extremities Has very little abnormal tongue movement  Neurological: She is alert.  Skin: Skin is warm and dry. She is not diaphoretic.       ASSESSMENT/ PLAN:  1. Dementia without behavior disturbance; is without change will continue aricept 10 mg daily and namenda xr 28 mg daily will not make changes will monitor   Her current weight is 178 pounds.   2. [name]: her cough could be related to her GERD; will stop the prilosec and will start protonix 40 mg daily and will monitor      MD is aware of resident's narcotic use and is in agreement with current plan of care. We will attempt to wean resident as appropriate.     Ok Edwards NP [facility]  Contact [phone] Monday through Friday 8am- 5pm  After hours call [phone]

## 2016-02-08 ENCOUNTER — Encounter: Payer: Self-pay | Admitting: Adult Health

## 2016-02-08 ENCOUNTER — Non-Acute Institutional Stay (SKILLED_NURSING_FACILITY): Payer: Medicare Other | Admitting: Adult Health

## 2016-02-08 DIAGNOSIS — R4781 Slurred speech: Secondary | ICD-10-CM

## 2016-02-08 DIAGNOSIS — G2401 Drug induced subacute dyskinesia: Secondary | ICD-10-CM | POA: Diagnosis not present

## 2016-02-08 DIAGNOSIS — F0390 Unspecified dementia without behavioral disturbance: Secondary | ICD-10-CM

## 2016-02-08 NOTE — Progress Notes (Signed)
Patient ID: Debra Tran, female   DOB: May 29, 1927, 82 y.o.   MRN: [id number]   Location:   [place] Room Number: 225-B Place of Service:  SNF (31)   CODE STATUS: Full Code  Allergies  Allergen Reactions  . Ether Nausea And Vomiting    Chief Complaint  Patient presents with  . Acute Visit    HPI:  Her daughter is worried about her mother's status. Debra Tran has slurred speech this morning. She had slurred speech prior to going to the ED on Monday and is concerned that her mother is having a stroke. Her workup in the ED was negative. There are no indications of infection present.  She tells me that she is feeling good. She denies a cough. Her speech is clear and she able to speak in complete sentences. Her daughter would like for her to have a further workup.  We did discuss treatment options including an ED visit; which she declined; she would like for her mother to be seen by neurology.    Past Medical History:  Diagnosis Date  . Allergic rhinitis   . CATARACT 06/06/2006   Qualifier: Diagnosis of  By: Genene Churn MD, Janett Billow    . Depressive disorder   . Diabetes mellitus type 2 in obese ([place])   . Fungal dermatitis   . Hyperlipidemia   . Hypertension   . Macular degeneration   . Mild cognitive impairment   . Narcissism   . Normocytic anemia   . Phobia   . Schizophrenia, paranoid ([place])   . Stress incontinence, female     Past Surgical History:  Procedure Laterality Date  . ABDOMINAL HYSTERECTOMY     due to fibroids  . CATARACT EXTRACTION  6 16 2006  . CRYOTHERAPY  2 15 2006   facial AK  . hyperplastic   4 20 2006   AK shave biopsy  . intraocular injection  [id number]  . TOTAL ABDOMINAL HYSTERECTOMY W/ BILATERAL SALPINGOOPHORECTOMY     due to endometriosis  non malignant    Social History   Social History  . Marital status: Divorced    Spouse name: N/A  . Number of children: N/A  . Years of education: N/A   Occupational History  . Not on file.    Social History Main Topics  . Smoking status: Former Research scientist (life sciences)  . Smokeless tobacco: Never Used  . Alcohol use No  . Drug use: No  . Sexual activity: Not on file   Other Topics Concern  . Not on file   Social History Narrative   Lives in [facility] (assisted living), remotely smoked and chewed tobacco. Daughter(Debra Tran)  drives her, independent of all ADL's   Family History  Problem Relation Age of Onset  . Heart disease Mother   . Stroke Mother   . Heart disease Father   . Stroke Father   . Cancer Cousin     liver and colon      VITAL SIGNS BP (!) 145/67   Pulse 65   Temp 97.3 F (36.3 C) (Oral)   Resp 18   Ht 4\' 9"  (1.448 m)   Wt 178 lb 6 oz (80.9 kg)   SpO2 98%   BMI 38.60 kg/m   Patient's Medications  New Prescriptions   No medications on file  Previous Medications   ACETAMINOPHEN (TYLENOL) 325 MG TABLET    Take 650 mg by mouth every 4 (four) hours as needed.   ASPIRIN EC 325 MG TABLET  Take 1 tablet (325 mg total) by mouth daily.   ATORVASTATIN (LIPITOR) 20 MG TABLET    Take 1 tablet (20 mg total) by mouth at bedtime.   BENZTROPINE (COGENTIN) 1 MG TABLET    Take 1 mg by mouth at bedtime.   CALCIUM CARBONATE-VITAMIN D (CALCIUM 600-D) 600-400 MG-UNIT PER TABLET    Take 1 tablet by mouth every morning.    CETIRIZINE (ZYRTEC) 10 MG TABLET    Take 10 mg by mouth daily.   CHOLECALCIFEROL (VITAMIN D) 1000 UNITS TABLET    Take 1,000 Units by mouth daily.   CYANOCOBALAMIN 1000 MCG TABLET    Take 100 mcg by mouth daily.   DOCUSATE SODIUM (COLACE) 100 MG CAPSULE    Take 1 capsule (100 mg total) by mouth daily. For constipation   DONEPEZIL (ARICEPT) 10 MG TABLET    Take 10 mg by mouth at bedtime.   ESCITALOPRAM (LEXAPRO) 10 MG TABLET    Take 20 mg by mouth at bedtime. For depression   GLIPIZIDE (GLUCOTROL) 10 MG TABLET    Take 5 mg by mouth 2 (two) times daily before a meal.    HYPROMELLOSE 0.4 % SOLN    Place 1 drop into both eyes 3 (three) times daily.    INSULIN ASPART (NOVOLOG) 100 UNIT/ML INJECTION    Inject 5 Units into the skin 3 (three) times daily before meals.   INSULIN DETEMIR (LEVEMIR FLEXPEN) 100 UNIT/ML PEN    Inject 5 Units into the skin at bedtime.   KETOTIFEN FUMARATE (ZADITOR OP)    Place 1 drop into both eyes 2 (two) times daily.   MEMANTINE HCL ER (NAMENDA XR) 28 MG CP24    Take 28 mg by mouth daily. For dementia   MULTIPLE VITAMIN (MULTIVITAMIN WITH MINERALS) TABS    Take 1 tablet by mouth every morning.   NUTRITIONAL SUPPLEMENTS (NUTRITIONAL SUPPLEMENT PO)    Take by mouth. HSG Puree texture, Nectar consistency   OLANZAPINE (ZYPREXA) 5 MG TABLET    Take 10 mg by mouth at bedtime.    OMEGA-3 FATTY ACIDS (FISH OIL) 1000 MG CAPS    Take 1,000 mg by mouth daily.   PANTOPRAZOLE (PROTONIX) 40 MG TABLET    Take 40 mg by mouth daily.   PROTEIN POWD    Take 2 scoop by mouth 2 (two) times daily.   TRIAMCINOLONE (KENALOG) 0.025 % OINTMENT    Apply 1 application topically daily. Apply to arms for skin integrity  Modified Medications   No medications on file  Discontinued Medications   NEOMYCIN-POLYMYXIN B-DEXAMETHASONE (MAXITROL) 3.5-10000-0.1 OINT    Place 1 application into the right eye 3 (three) times daily.   OMEPRAZOLE (PRILOSEC) 20 MG CAPSULE    Take 20 mg by mouth daily.     SIGNIFICANT DIAGNOSTIC EXAMS  08-19-14: mammogram: scattered fibroglandular tissue   12-30-14: right eye intraocular injection   02-06-16: ct of head: Atrophy and small vessel disease, similar to priors. No acute intracranial findings.  02-06-16: mri of brain: Atrophy and small vessel disease, similar to priors. No acute stroke is evident.  02-06-16: ct angio of head and neck: CT HEADNo intracranial hemorrhage or CT evidence of large acute infarct. Chronic microvascular changes. No intracranial mass abnormal enhancement. Global atrophy without hydrocephalus.   CTA NECK Plaque right carotid bifurcation and proximal right internal carotid artery  with less than 50% diameter narrowing. Mild plaque proximal left internal carotid artery with less than 50% diameter narrowing. Plaque with mild narrowing  origin of vertebral arteries bilaterally. Left vertebral artery slightly dominant size.   CTA HEAD Plaque with mild narrowing cavernous segment internal carotid artery bilaterally. Anterior circulation without medium or large size vessel significant stenosis or occlusion. Mild narrowing distal right vertebral artery. No significant stenosis left vertebral artery or basilar artery.      LABS REVIEWED:  02-25-15: urine micro-albumin <1.2  07-12-15: wbc 8.2; hgb 12.7; hct 38.1; mcv 84.2; plt 229; glucose 94; bun 13.0; creat 0.72; k+ 4.4; na++ 141; liver normal albumin 3.4; hgb a1c 6.4; chol 135; ldl 64; trig 130; hdl 45  12-09-15: glucose 256; bun 14.6; creat 1.01; k+ 4.5; na++ 137; liver normal albumin 3.5; chol 156; ldl 61; trig 246; hdl 45  hgb a1c 7.6  02-06-16: wbc 5.6; hgb 13.0; hct 40.3; mcv 86.1; plt 149; glucose 239; bun 8; creat 0.84; k+ 4.0; na++ 132; liver normal albumin 3.1; blood culture: no growth; UA: neg   Review of Systems Constitutional: Negative for malaise/fatigue.  Respiratory: Negative for cough and shortness of breath.   Cardiovascular: Negative for chest pain, palpitations and leg swelling.  Gastrointestinal: Negative for heartburn, abdominal pain and constipation.  Musculoskeletal: Negative for myalgias and joint pain.  Skin: Negative.   Neurological: Negative for headaches.  Psychiatric/Behavioral: The patient is not nervous/anxious.       Physical Exam Constitutional: No distress.  Overweight   Neck: Neck supple. No JVD present. No thyromegaly present.  Cardiovascular: Normal rate, regular rhythm and intact distal pulses.   Respiratory: Effort normal and breath sounds normal. No respiratory distress.  GI: Soft. Bowel sounds are normal. She exhibits no distension. There is no tenderness.  Musculoskeletal:  She exhibits no edema.  Is able to move all extremities Is able to touch her head and lift both lower extremities Has very little abnormal tongue movement  Neurological: She is alert.  Skin: Skin is warm and dry. She is not diaphoretic.       ASSESSMENT/ PLAN:  1. Dementia without behavior disturbance; is without change will continue aricept 10 mg daily and namenda xr 28 mg daily will not make changes will monitor   Her current weight is 178 pounds.   2. Tremor: she does not have a resting tremor present she does have a small amount of abnormal tongue movement and lip tremor. Will continue cogentin 0.5 mg nightly   3. Intermittent slurred speech: will setup a neurology consult ASAP; will have OT and ST evaluate and treat as indicated. Will continue asa 325 mg daily per ED recommendation and will monitor status.    Time spent with patient   45  minutes >50% time spent counseling; reviewing medical record; tests; labs; and developing future plan of care    MD is aware of resident's narcotic use and is in agreement with current plan of care. We will attempt to wean resident as apropriate   Ok Edwards NP [facility] Adult Medicine  Contact [phone] Monday through Friday 8am- 5pm  After hours call [phone]

## 2016-02-10 ENCOUNTER — Non-Acute Institutional Stay (SKILLED_NURSING_FACILITY): Payer: Medicare Other | Admitting: Adult Health

## 2016-02-10 ENCOUNTER — Encounter: Payer: Self-pay | Admitting: Adult Health

## 2016-02-10 DIAGNOSIS — F209 Schizophrenia, unspecified: Secondary | ICD-10-CM

## 2016-02-10 DIAGNOSIS — R4781 Slurred speech: Secondary | ICD-10-CM

## 2016-02-10 DIAGNOSIS — E119 Type 2 diabetes mellitus without complications: Secondary | ICD-10-CM

## 2016-02-10 DIAGNOSIS — Z794 Long term (current) use of insulin: Secondary | ICD-10-CM

## 2016-02-10 DIAGNOSIS — G2401 Drug induced subacute dyskinesia: Secondary | ICD-10-CM | POA: Diagnosis not present

## 2016-02-10 DIAGNOSIS — E1169 Type 2 diabetes mellitus with other specified complication: Secondary | ICD-10-CM

## 2016-02-10 DIAGNOSIS — K219 Gastro-esophageal reflux disease without esophagitis: Secondary | ICD-10-CM | POA: Diagnosis not present

## 2016-02-10 DIAGNOSIS — K5909 Other constipation: Secondary | ICD-10-CM | POA: Diagnosis not present

## 2016-02-10 DIAGNOSIS — E785 Hyperlipidemia, unspecified: Secondary | ICD-10-CM

## 2016-02-10 DIAGNOSIS — F0390 Unspecified dementia without behavioral disturbance: Secondary | ICD-10-CM | POA: Diagnosis not present

## 2016-02-10 NOTE — Progress Notes (Signed)
Patient ID: Debra Tran, female   DOB: [birth year], 82 y.o.   MRN: [id number]   Location:    [facility] Room Number: 225-B Place of Service:  SNF (31)   CODE STATUS: Full Code  Allergies  Allergen Reactions  . Ether Nausea And Vomiting    Chief Complaint  Patient presents with  . Medical Management of Chronic Issues    Follow up    HPI:  She is a long term resident of this facility being seen for the management of her chronic illnesses. Her daughter is concerned about her having a cough at night.  She was started on mucinex last night by the on call provider. Today she tells me that she is feeling good and has no complaints. There are no nursing concerns at this time.    Past Medical History:  Diagnosis Date  . Allergic rhinitis   . CATARACT 06/06/2006   Qualifier: Diagnosis of  By: Genene Churn MD, Janett Billow    . Depressive disorder   . Diabetes mellitus type 2 in obese ([place])   . Fungal dermatitis   . Hyperlipidemia   . Hypertension   . Macular degeneration   . Mild cognitive impairment   . Narcissism   . Normocytic anemia   . Phobia   . Schizophrenia, paranoid ([place])   . Stress incontinence, female     Past Surgical History:  Procedure Laterality Date  . ABDOMINAL HYSTERECTOMY     due to fibroids  . CATARACT EXTRACTION  6 16 2006  . CRYOTHERAPY  2 15 2006   facial AK  . hyperplastic   4 20 2006   AK shave biopsy  . intraocular injection  [id number]  . TOTAL ABDOMINAL HYSTERECTOMY W/ BILATERAL SALPINGOOPHORECTOMY     due to endometriosis  non malignant    Social History   Social History  . Marital status: Divorced    Spouse name: N/A  . Number of children: N/A  . Years of education: N/A   Occupational History  . Not on file.   Social History Main Topics  . Smoking status: Former Research scientist (life sciences)  . Smokeless tobacco: Never Used  . Alcohol use No  . Drug use: No  . Sexual activity: Not on file   Other Topics Concern  . Not on file   Social  History Narrative   Lives in [facility] (assisted living), remotely smoked and chewed tobacco. Daughter([name])  drives her, independent of all ADL's   Family History  Problem Relation Age of Onset  . Heart disease Mother   . Stroke Mother   . Heart disease Father   . Stroke Father   . Cancer Cousin     liver and colon      VITAL SIGNS BP (!) 173/70   Pulse 100   Temp 98 F (36.7 C) (Oral)   Resp 18   Ht 4\' 9"  (1.448 m)   Wt 178 lb 6 oz (80.9 kg)   SpO2 96%   BMI 38.60 kg/m   Patient's Medications  New Prescriptions   No medications on file  Previous Medications   ACETAMINOPHEN (TYLENOL) 325 MG TABLET    Take 650 mg by mouth every 4 (four) hours as needed.   ASPIRIN EC 325 MG TABLET    Take 1 tablet (325 mg total) by mouth daily.   ATORVASTATIN (LIPITOR) 20 MG TABLET    Take 1 tablet (20 mg total) by mouth at bedtime.   BENZTROPINE (COGENTIN) 1 MG  TABLET    Take 1 mg by mouth at bedtime.   CALCIUM CARBONATE-VITAMIN D (CALCIUM 600-D) 600-400 MG-UNIT PER TABLET    Take 1 tablet by mouth every morning.    CETIRIZINE (ZYRTEC) 10 MG TABLET    Take 10 mg by mouth daily.   CHOLECALCIFEROL (VITAMIN D) 1000 UNITS TABLET    Take 1,000 Units by mouth daily.   CYANOCOBALAMIN 1000 MCG TABLET    Take 100 mcg by mouth daily.   DOCUSATE SODIUM (COLACE) 100 MG CAPSULE    Take 1 capsule (100 mg total) by mouth daily. For constipation   DONEPEZIL (ARICEPT) 10 MG TABLET    Take 10 mg by mouth at bedtime.   ESCITALOPRAM (LEXAPRO) 10 MG TABLET    Take 20 mg by mouth at bedtime. For depression   GLIPIZIDE (GLUCOTROL) 10 MG TABLET    Take 5 mg by mouth 2 (two) times daily before a meal.    HYPROMELLOSE 0.4 % SOLN    Place 1 drop into both eyes 3 (three) times daily.   INSULIN ASPART (NOVOLOG) 100 UNIT/ML INJECTION    Inject 5 Units into the skin 3 (three) times daily before meals.   INSULIN DETEMIR (LEVEMIR FLEXPEN) 100 UNIT/ML PEN    Inject 5 Units into the skin at bedtime.   KETOTIFEN  FUMARATE (ZADITOR OP)    Place 1 drop into both eyes 2 (two) times daily.   MEMANTINE HCL ER (NAMENDA XR) 28 MG CP24    Take 28 mg by mouth daily. For dementia   MULTIPLE VITAMIN (MULTIVITAMIN WITH MINERALS) TABS    Take 1 tablet by mouth every morning.   NUTRITIONAL SUPPLEMENTS (NUTRITIONAL SUPPLEMENT PO)    Take by mouth. HSG Puree texture, Nectar consistency   OLANZAPINE (ZYPREXA) 5 MG TABLET    Take 10 mg by mouth at bedtime.    OMEGA-3 FATTY ACIDS (FISH OIL) 1000 MG CAPS    Take 1,000 mg by mouth daily.   PANTOPRAZOLE (PROTONIX) 40 MG TABLET    Take 40 mg by mouth daily.   PROTEIN POWD    Take 2 scoop by mouth 2 (two) times daily.   TRIAMCINOLONE (KENALOG) 0.025 % OINTMENT    Apply 1 application topically daily. Apply to arms for skin integrity  Modified Medications   No medications on file  Discontinued Medications   No medications on file     SIGNIFICANT DIAGNOSTIC EXAMS  08-19-14: mammogram: scattered fibroglandular tissue   12-30-14: right eye intraocular injection   02-06-16: ct of head: Atrophy and small vessel disease, similar to priors. No acute intracranial findings.  02-06-16: mri of brain: Atrophy and small vessel disease, similar to priors. No acute stroke is evident.  02-06-16: ct angio of head and neck: CT HEADNo intracranial hemorrhage or CT evidence of large acute infarct. Chronic microvascular changes. No intracranial mass abnormal enhancement. Global atrophy without hydrocephalus.   CTA NECK Plaque right carotid bifurcation and proximal right internal carotid artery with less than 50% diameter narrowing. Mild plaque proximal left internal carotid artery with less than 50% diameter narrowing. Plaque with mild narrowing origin of vertebral arteries bilaterally. Left vertebral artery slightly dominant size.   CTA HEAD Plaque with mild narrowing cavernous segment internal carotid artery bilaterally. Anterior circulation without medium or large size vessel  significant stenosis or occlusion. Mild narrowing distal right vertebral artery. No significant stenosis left vertebral artery or basilar artery.      LABS REVIEWED:  02-25-15: urine micro-albumin <1.2  07-12-15:  wbc 8.2; hgb 12.7; hct 38.1; mcv 84.2; plt 229; glucose 94; bun 13.0; creat 0.72; k+ 4.4; na++ 141; liver normal albumin 3.4; hgb a1c 6.4; chol 135; ldl 64; trig 130; hdl 45  12-09-15: glucose 256; bun 14.6; creat 1.01; k+ 4.5; na++ 137; liver normal albumin 3.5; chol 156; ldl 61; trig 246; hdl 45  hgb a1c 7.6  02-06-16: wbc 5.6; hgb 13.0; hct 40.3; mcv 86.1; plt 149; glucose 239; bun 8; creat 0.84; k+ 4.0; na++ 132; liver normal albumin 3.1; blood culture: no growth; UA: neg   Review of Systems Constitutional: Negative for malaise/fatigue.  Respiratory: Negative for cough and shortness of breath.   Cardiovascular: Negative for chest pain, palpitations and leg swelling.  Gastrointestinal: Negative for heartburn, abdominal pain and constipation.  Musculoskeletal: Negative for myalgias and joint pain.  Skin: Negative.   Neurological: Negative for headaches.  Psychiatric/Behavioral: The patient is not nervous/anxious.       Physical Exam Constitutional: No distress.  Overweight   Neck: Neck supple. No JVD present. No thyromegaly present.  Cardiovascular: Normal rate, regular rhythm and intact distal pulses.   Respiratory: Effort normal and breath sounds normal. No respiratory distress.  GI: Soft. Bowel sounds are normal. She exhibits no distension. There is no tenderness.  Musculoskeletal: She exhibits no edema.  Is able to move all extremities Is able to touch her head and lift both lower extremities Has very little abnormal tongue movement  Neurological: She is alert.  Skin: Skin is warm and dry. She is not diaphoretic.       ASSESSMENT/ PLAN:  1. Dementia without behavior disturbance; is without change will continue aricept 10 mg daily and namenda xr 28 mg daily  will not make changes will monitor   Her current weight is 178 pounds and is currently stable.   2. Tremor: she does not have a resting tremor present she does have a small amount of abnormal tongue movement and lip tremor. Will continue cogentin 1 mg nightly   3. Dyslipidemia: will continue lipitor 20 mg daily fish oil 1 gm daily trig 246;  ldl is 61  4. Diabetes  hgb a1c 7.6  Will continue levemir  5 units;  will continue glipizide 5 mg twice daily  novolog  5 units with meals    5. Gerd: will continue protonix 40 mg daily   6. Constipation: will continue colace daily    7. Depression: will continue lexapro 20 mg nightly will monitor   8. Schizophrenia: is stable will continue zyprexa 10 mg nightly and will monitor   Is followed by psych services.  Will continue cogentin 1 mg nightly for tardive dyskinesia and will monitor  She requires this current dose of zyprexa at this time; as she is emotionally stable; lower her dose could adversely affect her quality of life and the benefits of this medication outweigh the risks.   9. Macular degeneration: will contine   zaditor to both eyes twice daily   10. Cough: will continue her mucinex twice daily and will begin flonase 2 puffs at night  11. intermittent slurred speech: will continue her asa 325 mg daily awaiting neurology consult and ENT consult.      MD is aware of resident's narcotic use and is in agreement with current plan of care. We will attempt to wean resident as appropriate.   Ok Edwards NP [facility]  Contact [phone] Monday through Friday 8am- 5pm  After hours call [phone]

## 2016-02-11 LAB — CULTURE, BLOOD (ROUTINE X 2)
CULTURE: NO GROWTH
Culture: NO GROWTH

## 2016-02-14 ENCOUNTER — Encounter: Payer: Self-pay | Admitting: Adult Health

## 2016-02-14 ENCOUNTER — Non-Acute Institutional Stay (SKILLED_NURSING_FACILITY): Payer: Medicare Other | Admitting: Adult Health

## 2016-02-14 DIAGNOSIS — L89313 Pressure ulcer of right buttock, stage 3: Secondary | ICD-10-CM

## 2016-02-14 NOTE — Progress Notes (Signed)
Patient ID: Debra Tran, female   DOB: Oct 16, 1927, 82 y.o.   MRN: [id number]   Location:   [place] Room Number: 225-B Place of Service:  SNF (31)   CODE STATUS: Full Code  Allergies  Allergen Reactions  . Ether Nausea And Vomiting    Chief Complaint  Patient presents with  . Acute Visit    Buttock Wound    HPI:  She has on her right buttock a superficial stage III ulceration. The periwound is red without warmth present. She is not voicing any complaints of pain present. There are no reports of infection present.    Past Medical History:  Diagnosis Date  . Allergic rhinitis   . CATARACT 06/06/2006   Qualifier: Diagnosis of  By: Genene Churn MD, Janett Billow    . Depressive disorder   . Diabetes mellitus type 2 in obese ([place])   . Fungal dermatitis   . Hyperlipidemia   . Hypertension   . Macular degeneration   . Mild cognitive impairment   . Narcissism   . Normocytic anemia   . Phobia   . Schizophrenia, paranoid ([place])   . Stress incontinence, female     Past Surgical History:  Procedure Laterality Date  . ABDOMINAL HYSTERECTOMY     due to fibroids  . CATARACT EXTRACTION  6 16 2006  . CRYOTHERAPY  2 15 2006   facial AK  . hyperplastic   4 20 2006   AK shave biopsy  . intraocular injection  [id number]  . TOTAL ABDOMINAL HYSTERECTOMY W/ BILATERAL SALPINGOOPHORECTOMY     due to endometriosis  non malignant    Social History   Social History  . Marital status: Divorced    Spouse name: N/A  . Number of children: N/A  . Years of education: N/A   Occupational History  . Not on file.   Social History Main Topics  . Smoking status: Former Research scientist (life sciences)  . Smokeless tobacco: Never Used  . Alcohol use No  . Drug use: No  . Sexual activity: Not on file   Other Topics Concern  . Not on file   Social History Narrative   Lives in [facility] (assisted living), remotely smoked and chewed tobacco. Daughter([name])  drives her, independent of all ADL's    Family History  Problem Relation Age of Onset  . Heart disease Mother   . Stroke Mother   . Heart disease Father   . Stroke Father   . Cancer Cousin     liver and colon      VITAL SIGNS BP 136/70   Pulse 72   Temp 98 F (36.7 C) (Oral)   Resp 18   Ht 4\' 9"  (1.448 m)   Wt 178 lb 6 oz (80.9 kg)   SpO2 96%   BMI 38.60 kg/m   Patient's Medications  New Prescriptions   No medications on file  Previous Medications   ACETAMINOPHEN (TYLENOL) 325 MG TABLET    Take 650 mg by mouth every 4 (four) hours as needed.   ASPIRIN EC 325 MG TABLET    Take 1 tablet (325 mg total) by mouth daily.   ATORVASTATIN (LIPITOR) 20 MG TABLET    Take 1 tablet (20 mg total) by mouth at bedtime.   BENZTROPINE (COGENTIN) 1 MG TABLET    Take 1 mg by mouth at bedtime.   CALCIUM CARBONATE-VITAMIN D (CALCIUM 600-D) 600-400 MG-UNIT PER TABLET    Take 1 tablet by mouth every morning.  CARBOMER GEL BASE (HYDROGEL) GEL    by Does not apply route. Apply to right buttock one time daily   CETIRIZINE (ZYRTEC) 10 MG TABLET    Take 10 mg by mouth daily.   CHOLECALCIFEROL (VITAMIN D) 1000 UNITS TABLET    Take 1,000 Units by mouth daily.   CYANOCOBALAMIN 1000 MCG TABLET    Take 100 mcg by mouth daily.   DOCUSATE SODIUM (COLACE) 100 MG CAPSULE    Take 1 capsule (100 mg total) by mouth daily. For constipation   DONEPEZIL (ARICEPT) 10 MG TABLET    Take 10 mg by mouth at bedtime.   ESCITALOPRAM (LEXAPRO) 10 MG TABLET    Take 20 mg by mouth at bedtime. For depression   FLUTICASONE (FLONASE) 50 MCG/ACT NASAL SPRAY    Place 1 spray into both nostrils daily.   GLIPIZIDE (GLUCOTROL) 10 MG TABLET    Take 5 mg by mouth 2 (two) times daily before a meal.    HYPROMELLOSE 0.4 % SOLN    Place 1 drop into both eyes 3 (three) times daily.   INSULIN ASPART (NOVOLOG) 100 UNIT/ML INJECTION    Inject 5 Units into the skin 3 (three) times daily before meals.   INSULIN DETEMIR (LEVEMIR FLEXPEN) 100 UNIT/ML PEN    Inject 5 Units into the  skin at bedtime.   KETOTIFEN FUMARATE (ZADITOR OP)    Place 1 drop into both eyes 2 (two) times daily.   MEMANTINE HCL ER (NAMENDA XR) 28 MG CP24    Take 28 mg by mouth daily. For dementia   MULTIPLE VITAMIN (MULTIVITAMIN WITH MINERALS) TABS    Take 1 tablet by mouth every morning.   NUTRITIONAL SUPPLEMENTS (NUTRITIONAL SUPPLEMENT PO)    Take by mouth. HSG Puree texture, Nectar consistency   OLANZAPINE (ZYPREXA) 5 MG TABLET    Take 10 mg by mouth at bedtime.    OMEGA-3 FATTY ACIDS (FISH OIL) 1000 MG CAPS    Take 1,000 mg by mouth daily.   PANTOPRAZOLE (PROTONIX) 40 MG TABLET    Take 40 mg by mouth daily.   PROTEIN POWD    Take 2 scoop by mouth 2 (two) times daily.   TRIAMCINOLONE (KENALOG) 0.025 % OINTMENT    Apply 1 application topically daily. Apply to arms for skin integrity  Modified Medications   No medications on file  Discontinued Medications   No medications on file     SIGNIFICANT DIAGNOSTIC EXAMS  08-19-14: mammogram: scattered fibroglandular tissue   12-30-14: right eye intraocular injection   02-06-16: ct of head: Atrophy and small vessel disease, similar to priors. No acute intracranial findings.  02-06-16: mri of brain: Atrophy and small vessel disease, similar to priors. No acute stroke is evident.  02-06-16: ct angio of head and neck: CT HEADNo intracranial hemorrhage or CT evidence of large acute infarct. Chronic microvascular changes. No intracranial mass abnormal enhancement. Global atrophy without hydrocephalus.   CTA NECK Plaque right carotid bifurcation and proximal right internal carotid artery with less than 50% diameter narrowing. Mild plaque proximal left internal carotid artery with less than 50% diameter narrowing. Plaque with mild narrowing origin of vertebral arteries bilaterally. Left vertebral artery slightly dominant size.   CTA HEAD Plaque with mild narrowing cavernous segment internal carotid artery bilaterally. Anterior circulation without  medium or large size vessel significant stenosis or occlusion. Mild narrowing distal right vertebral artery. No significant stenosis left vertebral artery or basilar artery.      LABS REVIEWED:  02-25-15:  urine micro-albumin <1.2  07-12-15: wbc 8.2; hgb 12.7; hct 38.1; mcv 84.2; plt 229; glucose 94; bun 13.0; creat 0.72; k+ 4.4; na++ 141; liver normal albumin 3.4; hgb a1c 6.4; chol 135; ldl 64; trig 130; hdl 45  12-09-15: glucose 256; bun 14.6; creat 1.01; k+ 4.5; na++ 137; liver normal albumin 3.5; chol 156; ldl 61; trig 246; hdl 45  hgb a1c 7.6  02-06-16: wbc 5.6; hgb 13.0; hct 40.3; mcv 86.1; plt 149; glucose 239; bun 8; creat 0.84; k+ 4.0; na++ 132; liver normal albumin 3.1; blood culture: no growth; UA: neg   Review of Systems Constitutional: Negative for malaise/fatigue.  Respiratory: Negative for cough and shortness of breath.   Cardiovascular: Negative for chest pain, palpitations and leg swelling.  Gastrointestinal: Negative for heartburn, abdominal pain and constipation.  Musculoskeletal: Negative for myalgias and joint pain.  Skin: Negative.   Neurological: Negative for headaches.  Psychiatric/Behavioral: The patient is not nervous/anxious.       Physical Exam Constitutional: No distress.  Overweight   Neck: Neck supple. No JVD present. No thyromegaly present.  Cardiovascular: Normal rate, regular rhythm and intact distal pulses.   Respiratory: Effort normal and breath sounds normal. No respiratory distress.  GI: Soft. Bowel sounds are normal. She exhibits no distension. There is no tenderness.  Musculoskeletal: She exhibits no edema.  Is able to move all extremities Is able to touch her head and lift both lower extremities Has very little abnormal tongue movement  Neurological: She is alert.  Skin: Skin is warm and dry. She is not diaphoretic.  Right buttock stage III: 1.5 x 0.7 x 0.2 cm no signs of infection present; will use santyl and foam  dressing      ASSESSMENT/ PLAN:  1. Stage III right buttock ulceration: will begin santyl and foam dressing to wound; will check pre-albumin     Ok Edwards NP [facility]  Contact [phone] Monday through Friday 8am- 5pm  After hours call [phone]

## 2016-02-15 DIAGNOSIS — D509 Iron deficiency anemia, unspecified: Secondary | ICD-10-CM | POA: Diagnosis not present

## 2016-02-15 DIAGNOSIS — S31819A Unspecified open wound of right buttock, initial encounter: Secondary | ICD-10-CM | POA: Diagnosis not present

## 2016-02-17 DIAGNOSIS — L89313 Pressure ulcer of right buttock, stage 3: Secondary | ICD-10-CM | POA: Insufficient documentation

## 2016-02-21 DIAGNOSIS — R471 Dysarthria and anarthria: Secondary | ICD-10-CM | POA: Diagnosis not present

## 2016-02-23 DIAGNOSIS — F0391 Unspecified dementia with behavioral disturbance: Secondary | ICD-10-CM | POA: Diagnosis not present

## 2016-02-23 DIAGNOSIS — F329 Major depressive disorder, single episode, unspecified: Secondary | ICD-10-CM | POA: Diagnosis not present

## 2016-02-23 DIAGNOSIS — F259 Schizoaffective disorder, unspecified: Secondary | ICD-10-CM | POA: Diagnosis not present

## 2016-02-27 ENCOUNTER — Encounter: Payer: Self-pay | Admitting: Neurology

## 2016-02-27 ENCOUNTER — Ambulatory Visit (INDEPENDENT_AMBULATORY_CARE_PROVIDER_SITE_OTHER): Payer: Medicare Other | Admitting: Neurology

## 2016-02-27 VITALS — BP 133/64 | HR 71 | Ht 60.0 in | Wt 170.0 lb

## 2016-02-27 DIAGNOSIS — F0390 Unspecified dementia without behavioral disturbance: Secondary | ICD-10-CM

## 2016-02-27 DIAGNOSIS — G458 Other transient cerebral ischemic attacks and related syndromes: Secondary | ICD-10-CM

## 2016-02-27 DIAGNOSIS — G459 Transient cerebral ischemic attack, unspecified: Secondary | ICD-10-CM | POA: Insufficient documentation

## 2016-02-27 NOTE — Progress Notes (Signed)
[facility] [address] [place]. [place] [address] [phone]       OFFICE CONSULT NOTE  [name] Date of Birth:  1939/08/11 Medical Record Number:  [id number]   Referring MD:  Gildardo Cranker Reason for Referral:  TIA  HPI: [name] is a 82 year pleasant elderly Caucasian lady who is accompanied today by her daughter Lorriane Shire who provides most of the history. History is also obtained after review of electronic medical chart from a recent ER visit on 02/06/16. The nursing home staff stated that they noticed the patient to be flushed and not looking well. She was not able to get words out and speak-he normally does. Her blood pressure was found to be elevated at 210/90. EMS were called. They noticed that patient was having some tremulousness of her tongues and lips which the family stated is not new. She had trouble speaking as well as transient facial droop on one side which was concerning. Patient was taken to the emergency room for the above complaints but started improving day. Basic lab work was obtained which was unremarkable an EKG as well. MRI scan of the brain showed no acute infarct and I have personally reviewed. CT angiogram of the brain and neck also showed no large vessel stenosis or occlusion. There is mild narrowing of the cavernous segment of the internal carotid artery bilaterally due to plaque. Review of labs show last hemoglobin A1c on 9/117 was elevated at 7.6. Lipid profile at that time was LDL cholesterol 61, HDL 45, total cholesterol 155 and triglycerides 246 mg percent. Patient was started on aspirin which is tolerating well. Her blood pressure since then has done much better. Patient has history of significant cognitive impairment and dementia. Several decades. Her history is goes back to 1970s when she was diagnosed with schizoaffective disorder and depression. She in fact underwent cerebral TCD treatment and has had cognitive impairment since  then as per family. She has been on Aricept and Namenda for years. She had an episode of aggressive behavior 4 years ago requiring admission to geriatric behavioral unit in [place]. Her behavior since then has been much calmer.  ROS:   14 system review of systems is positive for fatigue, ringing in the ears, blurred vision, wheezing, incontinence, allergies, runny nose, joint pain, memory loss, confusion, slurred speech, anxiety, depression  PMH:  Past Medical History:  Diagnosis Date  . Allergic rhinitis   . CATARACT 06/06/2006   Qualifier: Diagnosis of  By: Genene Churn MD, Janett Billow    . Depressive disorder   . Diabetes mellitus type 2 in obese ([place])   . Fungal dermatitis   . Hyperlipidemia   . Hypertension   . Macular degeneration   . Mild cognitive impairment   . Narcissism   . Normocytic anemia   . Phobia   . Schizophrenia, paranoid ([place])   . Stress incontinence, female     Social History:  Social History   Social History  . Marital status: Divorced    Spouse name: N/A  . Number of children: N/A  . Years of education: N/A   Occupational History  . Not on file.   Social History Main Topics  . Smoking status: Former Research scientist (life sciences)  . Smokeless tobacco: Former Systems developer    Types: Chew  . Alcohol use No  . Drug use: No  . Sexual activity: Not on file   Other Topics Concern  . Not on file   Social History Narrative   Lives  in [facility] (assisted living), remotely smoked and chewed tobacco. Daughter([name])  drives her, independent of all ADL's    Medications:   Current Outpatient Prescriptions on File Prior to Visit  Medication Sig Dispense Refill  . acetaminophen (TYLENOL) 325 MG tablet Take 650 mg by mouth every 4 (four) hours as needed.    [place] Kitchen aspirin EC 325 MG tablet Take 1 tablet (325 mg total) by mouth daily. 30 tablet 0  . atorvastatin (LIPITOR) 20 MG tablet Take 1 tablet (20 mg total) by mouth at bedtime. 30 tablet 11  . benztropine (COGENTIN) 1 MG tablet Take 1  mg by mouth at bedtime.    . Calcium Carbonate-Vitamin D (CALCIUM 600-D) 600-400 MG-UNIT per tablet Take 1 tablet by mouth every morning.     . Carbomer Gel Base (HYDROGEL) GEL by Does not apply route. Apply to right buttock one time daily    . cetirizine (ZYRTEC) 10 MG tablet Take 10 mg by mouth daily.    . cholecalciferol (VITAMIN D) 1000 UNITS tablet Take 1,000 Units by mouth daily.    . cyanocobalamin 1000 MCG tablet Take 100 mcg by mouth daily.    Marland Kitchen docusate sodium (COLACE) 100 MG capsule Take 1 capsule (100 mg total) by mouth daily. For constipation 100 capsule 11  . donepezil (ARICEPT) 10 MG tablet Take 10 mg by mouth at bedtime.    Mar[place] Kitchen escitalopram (LEXAPRO) 10 MG tablet Take 20 mg by mouth at bedtime. For depression    . fluticasone (FLONASE) 50 MCG/ACT nasal spray Place 1 spray into both nostrils daily.    Marland Kitchen glipiZIDE (GLUCOTROL) 10 MG tablet Take 5 mg by mouth 2 (two) times daily before a meal.     . Hypromellose 0.4 % SOLN Place 1 drop into both eyes 3 (three) times daily.    . insulin aspart (NOVOLOG) 100 UNIT/ML injection Inject 5 Units into the skin 3 (three) times daily before meals.    . Insulin Detemir (LEVEMIR FLEXPEN) 100 UNIT/ML Pen Inject 5 Units into the skin at bedtime.    Marland Kitchen Ketotifen Fumarate (ZADITOR OP) Place 1 drop into both eyes 2 (two) times daily.    . Memantine HCl ER (NAMENDA XR) 28 MG CP24 Take 28 mg by mouth daily. For dementia    . Multiple Vitamin (MULTIVITAMIN WITH MINERALS) TABS Take 1 tablet by mouth every morning.    . Nutritional Supplements (NUTRITIONAL SUPPLEMENT PO) Take by mouth. HSG Puree texture, Nectar consistency    . OLANZapine (ZYPREXA) 5 MG tablet Take 10 mg by mouth at bedtime.     . Omega-3 Fatty Acids (FISH OIL) 1000 MG CAPS Take 1,000 mg by mouth daily.    . pantoprazole (PROTONIX) 40 MG tablet Take 40 mg by mouth daily.    . Protein POWD Take 2 scoop by mouth 2 (two) times daily.    Marland Kitchen triamcinolone (KENALOG) 0.025 % ointment Apply 1  application topically daily. Apply to arms for skin integrity     No current facility-administered medications on file prior to visit.     Allergies:   Allergies  Allergen Reactions  . Ether Nausea And Vomiting    Physical Exam General: well developed, well nourished Elderly Caucasian lady, seated, in no evident distress Head: head normocephalic and atraumatic.   Neck: supple with no carotid or supraclavicular bruits Cardiovascular: regular rate and rhythm, no murmurs Musculoskeletal: no deformity Skin:  no rash/petichiae Vascular:  Normal pulses all extremities  Neurologic Exam Mental Status: Awake and  fully alert. Disoriented to place and time. Recent and remote memory poor . Attention span, concentration and fund of knowledge diminished. Flat affect . Follows only simple midline and occasional one-step commands Mini-Mental status exam not done  Cranial Nerves: Fundoscopic exam reveals sharp disc margins. Pupils equal, briskly reactive to light. Extraocular movements full without nystagmus. Visual fields full to confrontation. Hearing intact. Facial sensation intact. Face, tongue, palate moves normally and symmetrically.  Motor: Normal bulk and tone. Normal strength in all tested extremity muscles. Sensory.: intact to touch , pinprick , position and vibratory sensation.  Coordination: Rapid alternating movements normal in all extremities. Finger-to-nose and heel-to-shin performed accurately bilaterally. Gait and Station: Deferred as patient walks minimally with a walker and 1 person assist Reflexes: 1+ and symmetric. Toes downgoing.   NIHSS  3 Modified Rankin  3   ASSESSMENT:  82 year old lady with baseline dementia and long standing schizoaffective disorder with transient episode of garbled speech and confusion and facial droop likely a TIA in October 2017 in the setting of a hypertensive emergency.. Vascular risk factors of hypertension, diabetes and  hyperlipidemia.   PLAN: I had a long discussion with the patient and daughter regarding her recent episode of transient speech difficulties confusion and facial droop possibly representing a TIA. I recommend to continue aspirin for stroke prevention and maintain strict control of hypertension with blood pressure goal below 130/90, diabetes with hemoglobin A1c goal below 6.5 and lipids with LDL cholesterol goal below 70 mg percent. Continue Aricept and Namenda in  the current dosages for her dementia. Check EEG to look for seizure activity given intermittent confusion episodes. Greater than 50% time during this 45 minute consultation visit was spent on counseling and coordination of care about TIA and stroke risk, prevention and treatment Return for follow-up in the future in 2 months or call earlier if necessary. Antony Contras, MD  [facility] [address] [place], [place] [address]  Phone [phone] Fax [phone] Note: This document was prepared with digital dictation and possible smart phrase technology. Any transcriptional errors that result from this process are unintentional.

## 2016-03-06 ENCOUNTER — Ambulatory Visit (INDEPENDENT_AMBULATORY_CARE_PROVIDER_SITE_OTHER): Payer: Medicare Other

## 2016-03-06 DIAGNOSIS — R410 Disorientation, unspecified: Secondary | ICD-10-CM | POA: Diagnosis not present

## 2016-03-06 DIAGNOSIS — F0390 Unspecified dementia without behavioral disturbance: Secondary | ICD-10-CM

## 2016-03-13 ENCOUNTER — Encounter: Payer: Self-pay | Admitting: Adult Health

## 2016-03-13 ENCOUNTER — Non-Acute Institutional Stay (SKILLED_NURSING_FACILITY): Payer: Medicare Other | Admitting: Adult Health

## 2016-03-13 DIAGNOSIS — E785 Hyperlipidemia, unspecified: Secondary | ICD-10-CM | POA: Diagnosis not present

## 2016-03-13 DIAGNOSIS — G458 Other transient cerebral ischemic attacks and related syndromes: Secondary | ICD-10-CM

## 2016-03-13 DIAGNOSIS — I10 Essential (primary) hypertension: Secondary | ICD-10-CM | POA: Diagnosis not present

## 2016-03-13 DIAGNOSIS — J3089 Other allergic rhinitis: Secondary | ICD-10-CM | POA: Diagnosis not present

## 2016-03-13 DIAGNOSIS — Z794 Long term (current) use of insulin: Secondary | ICD-10-CM | POA: Diagnosis not present

## 2016-03-13 DIAGNOSIS — F209 Schizophrenia, unspecified: Secondary | ICD-10-CM | POA: Diagnosis not present

## 2016-03-13 DIAGNOSIS — K219 Gastro-esophageal reflux disease without esophagitis: Secondary | ICD-10-CM

## 2016-03-13 DIAGNOSIS — E119 Type 2 diabetes mellitus without complications: Secondary | ICD-10-CM

## 2016-03-13 DIAGNOSIS — E1169 Type 2 diabetes mellitus with other specified complication: Secondary | ICD-10-CM

## 2016-03-13 NOTE — Progress Notes (Signed)
Patient ID: Debra Tran, female   DOB: 05-24-1927, 82 y.o.   MRN: [id number]   Location:   [facility] Room Number: 225-B Place of Service:  SNF (31)   CODE STATUS: Full Code  Allergies  Allergen Reactions  . Ether Nausea And Vomiting    Chief Complaint  Patient presents with  . Medical Management of Chronic Issues    Follow up    HPI:  Debra Tran is a log term resident of this facility being seen for the management of her chronic illnesses. Debra Tran tells me that Debra Tran is feeling good and has no complaints. Her blood pressure reading have been elevated.  There are no nursing concerns at this time.    Past Medical History:  Diagnosis Date  . Allergic rhinitis   . CATARACT 06/06/2006   Qualifier: Diagnosis of  By: Genene Churn MD, Janett Billow    . Depressive disorder   . Diabetes mellitus type 2 in obese ([place])   . Fungal dermatitis   . Hyperlipidemia   . Hypertension   . Macular degeneration   . Mild cognitive impairment   . Narcissism   . Normocytic anemia   . Phobia   . Schizophrenia, paranoid ([place])   . Stress incontinence, female     Past Surgical History:  Procedure Laterality Date  . ABDOMINAL HYSTERECTOMY     due to fibroids  . CATARACT EXTRACTION  6 16 2006  . CRYOTHERAPY  2 15 2006   facial AK  . hyperplastic   4 20 2006   AK shave biopsy  . intraocular injection  [id number]  . TOTAL ABDOMINAL HYSTERECTOMY W/ BILATERAL SALPINGOOPHORECTOMY     due to endometriosis  non malignant    Social History   Social History  . Marital status: Divorced    Spouse name: N/A  . Number of children: N/A  . Years of education: N/A   Occupational History  . Not on file.   Social History Main Topics  . Smoking status: Former Research scientist (life sciences)  . Smokeless tobacco: Former Systems developer    Types: Chew  . Alcohol use No  . Drug use: No  . Sexual activity: Not on file   Other Topics Concern  . Not on file   Social History Narrative   Lives in [facility] (assisted living),  remotely smoked and chewed tobacco. Daughter([name])  drives her, independent of all ADL's   Family History  Problem Relation Age of Onset  . Heart disease Mother   . Heart disease Father   . Stroke Father   . Cancer Cousin     liver and colon  . Stroke Sister       VITAL SIGNS BP (!) 151/72   Pulse 87   Temp 97.4 F (36.3 C) (Oral)   Resp 18   Ht 4\' 9"  (1.448 m)   Wt 175 lb 2 oz (79.4 kg)   SpO2 98%   BMI 37.90 kg/m   Patient's Medications  New Prescriptions   No medications on file  Previous Medications   ACETAMINOPHEN (TYLENOL) 325 MG TABLET    Take 650 mg by mouth every 4 (four) hours as needed.   AMINO ACIDS-PROTEIN HYDROLYS PO    Take 30 mLs by mouth 2 (two) times daily.   ASPIRIN EC 325 MG TABLET    Take 1 tablet (325 mg total) by mouth daily.   ATORVASTATIN (LIPITOR) 20 MG TABLET    Take 1 tablet (20 mg total) by mouth at bedtime.  BENZTROPINE (COGENTIN) 1 MG TABLET    Take 1 mg by mouth at bedtime.   CALCIUM CARBONATE-VITAMIN D (CALCIUM 600-D) 600-400 MG-UNIT PER TABLET    Take 1 tablet by mouth every morning.    CETIRIZINE (ZYRTEC) 10 MG TABLET    Take 10 mg by mouth daily.   CHOLECALCIFEROL (VITAMIN D) 1000 UNITS TABLET    Take 1,000 Units by mouth daily.   CYANOCOBALAMIN 1000 MCG TABLET    Take 100 mcg by mouth daily.   DOCUSATE SODIUM (COLACE) 100 MG CAPSULE    Take 1 capsule (100 mg total) by mouth daily. For constipation   DONEPEZIL (ARICEPT) 10 MG TABLET    Take 10 mg by mouth at bedtime.   ESCITALOPRAM (LEXAPRO) 10 MG TABLET    Take 20 mg by mouth at bedtime. For depression   FLUTICASONE (FLONASE) 50 MCG/ACT NASAL SPRAY    Place 1 spray into both nostrils daily.   GLIPIZIDE (GLUCOTROL) 10 MG TABLET    Take 5 mg by mouth 2 (two) times daily before a meal.    HYPROMELLOSE 0.4 % SOLN    Place 1 drop into both eyes 3 (three) times daily.   INSULIN ASPART (NOVOLOG) 100 UNIT/ML INJECTION    Inject 5 Units into the skin 3 (three) times daily before meals.    INSULIN DETEMIR (LEVEMIR FLEXPEN) 100 UNIT/ML PEN    Inject 5 Units into the skin at bedtime.   KETOTIFEN FUMARATE (ZADITOR OP)    Place 1 drop into both eyes 2 (two) times daily.   MEMANTINE HCL ER (NAMENDA XR) 28 MG CP24    Take 28 mg by mouth daily. For dementia   MULTIPLE VITAMIN (MULTIVITAMIN WITH MINERALS) TABS    Take 1 tablet by mouth every morning.   NUTRITIONAL SUPPLEMENTS (NUTRITIONAL SUPPLEMENT PO)    Take by mouth. HSG Puree texture, Nectar consistency   OLANZAPINE (ZYPREXA) 5 MG TABLET    Take 10 mg by mouth at bedtime.    OMEGA-3 FATTY ACIDS (FISH OIL) 1000 MG CAPS    Take 1,000 mg by mouth daily.   PANTOPRAZOLE (PROTONIX) 40 MG TABLET    Take 40 mg by mouth daily.   PROTEIN POWD    Take 2 scoop by mouth 2 (two) times daily.   TRIAMCINOLONE (KENALOG) 0.025 % OINTMENT    Apply 1 application topically daily. Apply to arms for skin integrity  Modified Medications   No medications on file  Discontinued Medications   CARBOMER GEL BASE (HYDROGEL) GEL    by Does not apply route. Apply to right buttock one time daily     SIGNIFICANT DIAGNOSTIC EXAMS  08-19-14: mammogram: scattered fibroglandular tissue   12-30-14: right eye intraocular injection   02-06-16: ct of head: Atrophy and small vessel disease, similar to priors. No acute intracranial findings.  02-06-16: mri of brain: Atrophy and small vessel disease, similar to priors. No acute stroke is evident.  02-06-16: ct angio of head and neck: CT HEADNo intracranial hemorrhage or CT evidence of large acute infarct. Chronic microvascular changes. No intracranial mass abnormal enhancement. Global atrophy without hydrocephalus.  CTA NECK Plaque right carotid bifurcation and proximal right internal carotid artery with less than 50% diameter narrowing. Mild plaque proximal left internal carotid artery with less than 50% diameter narrowing. Plaque with mild narrowing origin of vertebral arteries bilaterally. Left vertebral artery  slightly dominant size. CTA HEAD Plaque with mild narrowing cavernous segment internal carotid artery bilaterally. Anterior circulation without medium or large  size vessel significant stenosis or occlusion. Mild narrowing distal right vertebral artery. No significant stenosis left vertebral artery or basilar artery.    03-12-16: EEG: This is a mildly abnormal EEG due to the presence of  moderate generalized slowing which is indicative of bihemispheric dysfunction which is a nonspecific finding seen in a variety of degenerative, hypoxic, ischemic, toxic, metabolic etiologies. No definite epileptiform activity is noted   LABS REVIEWED:  07-12-15: wbc 8.2; hgb 12.7; hct 38.1; mcv 84.2; plt 229; glucose 94; bun 13.0; creat 0.72; k+ 4.4; na++ 141; liver normal albumin 3.4; hgb a1c 6.4; chol 135; ldl 64; trig 130; hdl 45  12-09-15: glucose 256; bun 14.6; creat 1.01; k+ 4.5; na++ 137; liver normal albumin 3.5; chol 156; ldl 61; trig 246; hdl 45  hgb a1c 7.6  02-06-16: wbc 5.6; hgb 13.0; hct 40.3; mcv 86.1; plt 149; glucose 239; bun 8; creat 0.84; k+ 4.0; na++ 132; liver normal albumin 3.1; blood culture: no growth; UA: neg 02-15-16: pre-albumin 10    Review of Systems Constitutional: Negative for malaise/fatigue.  Respiratory: Negative for cough and shortness of breath.   Cardiovascular: Negative for chest pain, palpitations and leg swelling.  Gastrointestinal: Negative for heartburn, abdominal pain and constipation.  Musculoskeletal: Negative for myalgias and joint pain.  Skin: Negative.   Neurological: Negative for headaches.  Psychiatric/Behavioral: The patient is not nervous/anxious.       Physical Exam Constitutional: No distress.  Overweight   Neck: Neck supple. No JVD present. No thyromegaly present.  Cardiovascular: Normal rate, regular rhythm and intact distal pulses.   Respiratory: Effort normal and breath sounds normal. No respiratory distress.  GI: Soft. Bowel sounds are normal.  Debra Tran exhibits no distension. There is no tenderness.  Musculoskeletal: Debra Tran exhibits no edema.  Is able to move all extremities Has very little abnormal tongue movement  Neurological: Debra Tran is alert.  Skin: Skin is warm and dry. Debra Tran is not diaphoretic.    ASSESSMENT/ PLAN:   1. Dementia without behavior disturbance; is without change will continue aricept 10 mg daily and namenda xr 28 mg daily will not make changes will monitor   Her current weight is 175  pounds and is currently stable.   2. Tremor: Debra Tran does not have a resting tremor present Debra Tran does have a small amount of  lip tremor. Will continue cogentin 1 mg nightly   3. Dyslipidemia: will continue lipitor 20 mg daily fish oil 1 gm daily trig 246;  ldl is 61  4. Diabetes  hgb a1c 7.6  Will continue levemir  5 units;  will continue glipizide 5 mg twice daily  novolog  5 units with meals    5. Gerd: will continue protonix 40 mg daily   6. Constipation: will continue colace daily    7. Depression: will continue lexapro 20 mg nightly will monitor   8. Schizophrenia: is stable will continue zyprexa 10 mg nightly and will monitor   Is followed by psych services.  Will continue cogentin 1 mg nightly for tardive dyskinesia and will monitor  Debra Tran requires this current dose of zyprexa at this time; as Debra Tran is emotionally stable; lower her dose could adversely affect her quality of life and the benefits of this medication outweigh the risks.   9. Macular degeneration: will contine   zaditor to both eyes twice daily   10. Allergic rhinitis: will continue flonase to both nares daily and zyrtec 10 mg daily   11. TIA: is presently stable; will continue  asa 325 mg daily   12. Hypertension: will begin lisinopril 5 mg daily  Will check urine for micro-albumin and hgb a1c     MD is aware of resident's narcotic use and is in agreement with current plan of care. We will attempt to wean resident as apropriate   Ok Edwards NP [facility]  Contact [phone] Monday through Friday 8am- 5pm  After hours call [phone]

## 2016-03-14 ENCOUNTER — Ambulatory Visit: Payer: Medicare Other | Admitting: Podiatry

## 2016-03-14 ENCOUNTER — Telehealth: Payer: Self-pay

## 2016-03-14 DIAGNOSIS — E785 Hyperlipidemia, unspecified: Secondary | ICD-10-CM | POA: Diagnosis not present

## 2016-03-14 NOTE — Telephone Encounter (Signed)
Ltt vm for patients daughter Lorriane Shire on [place] form to call about EEG result

## 2016-03-14 NOTE — Telephone Encounter (Signed)
-----   Message from Garvin Fila, MD sent at 03/13/2016  8:41 AM EST ----- Kindly call the patient and informed that EEG study showed no evidence of seizure activity. Mild slowing of brain wave activity which is fairly common in patient with dementia. No worrisome findings

## 2016-03-16 NOTE — Telephone Encounter (Signed)
Rn call patients daughter Debra Tran about the EEG results.Rn stated that the EEG study showed no evidence of seizure activity. Mild slowing of brain wave activity which is fairly common in patient with dementia. No worrisome findings.vanessa verbalized understanding. Pts daughter wanted a copy left a front desk. Rn stated her mom will have to sign a release form to get the results. Rn advised Debra Tran if she is the [facility]  we need a copy at [facility] to release a copy to her. Debra Tran verbalized test results and will bring a copy. ------

## 2016-03-27 DIAGNOSIS — S31819A Unspecified open wound of right buttock, initial encounter: Secondary | ICD-10-CM | POA: Diagnosis not present

## 2016-03-27 DIAGNOSIS — E785 Hyperlipidemia, unspecified: Secondary | ICD-10-CM | POA: Diagnosis not present

## 2016-03-27 DIAGNOSIS — E118 Type 2 diabetes mellitus with unspecified complications: Secondary | ICD-10-CM | POA: Diagnosis not present

## 2016-03-27 LAB — BASIC METABOLIC PANEL
BUN: 12 mg/dL (ref 4–21)
Creatinine: 1 mg/dL (ref 0.5–1.1)
Glucose: 126 mg/dL
Potassium: 4.5 mmol/L (ref 3.4–5.3)
Sodium: 143 mmol/L (ref 137–147)

## 2016-03-27 LAB — HEMOGLOBIN A1C: Hemoglobin A1C: 7.8

## 2016-03-27 LAB — MICROALBUMIN, URINE

## 2016-03-28 ENCOUNTER — Ambulatory Visit (INDEPENDENT_AMBULATORY_CARE_PROVIDER_SITE_OTHER): Payer: Medicare Other | Admitting: Podiatry

## 2016-03-28 ENCOUNTER — Encounter: Payer: Self-pay | Admitting: Podiatry

## 2016-03-28 VITALS — BP 158/69 | HR 76 | Resp 18

## 2016-03-28 DIAGNOSIS — L02611 Cutaneous abscess of right foot: Secondary | ICD-10-CM

## 2016-03-28 DIAGNOSIS — B351 Tinea unguium: Secondary | ICD-10-CM | POA: Diagnosis not present

## 2016-03-28 DIAGNOSIS — M79674 Pain in right toe(s): Secondary | ICD-10-CM | POA: Diagnosis not present

## 2016-03-28 DIAGNOSIS — M79675 Pain in left toe(s): Secondary | ICD-10-CM

## 2016-03-28 DIAGNOSIS — L03031 Cellulitis of right toe: Secondary | ICD-10-CM

## 2016-03-28 MED ORDER — CEPHALEXIN 500 MG PO CAPS: 500.0000 mg | ORAL_CAPSULE | Freq: Two times a day (BID) | ORAL | 0 refills | Status: DC

## 2016-03-28 NOTE — Patient Instructions (Signed)
Apply topical antibiotic ointment such as triple antibiotic ointment daily to the skin ulcer on the right great toe and second right toe and cover with Band-Aids until the superficial ulcers close Wear sock and slippers only Oral antibiotics cephalexin 500 mg by mouth twice a day 7 days for cellulitis and second right toe Return if the redness around the superficial ulcer does not resolve after completing the antibiotics  [place] 03/28/2016

## 2016-03-29 NOTE — Progress Notes (Signed)
Patient ID: Debra Tran, female   DOB: 1928-01-11, 82 y.o.   MRN: [id number]   Subjective: This patient presents for a scheduled visit for debridement of uncomfortable toenails. The patient's daughters present in the treatment room today. Today's visit demonstrated a superficial ulcer in the second right toe surrounding with erythema. The patient or patient's daughter has no recollection of this problem until it was noticed today.  Objective: BP 158/69 Pulse 76 Respiration 18  Patient has difficulty answering questions and patient's daughter response to questioning   Patient transfers from wheelchair to treatment chair DP and PT pulses nonpalpable bilaterally Capillary reflex immediate bilaterally Sensation to 10 g monofilament wire intact 1/5 bilaterally Ankle reflexes reactive bilaterally Prominent HAV bunion right HAV bunion left Medial second right toe has 3 mm superficial ulcer granular base surrounded with local erythema and edema. There is no active drainage. Toenails are hypertrophic, elongated, deforms 6-10  Assessment: Type II diabetic with possible peripheral arterial disease Superficial ulcer second right toe with low-grade cellulitis Mycotic toenails 6-10  Plan: Debridement of toenails 6-10 mechanically and electrically without any bleeding  Apply antibiotic topical dressing the second right toe. Instructions provided to continue application of topical antibiotic ointment and Band-Aid to the second right toe daily Wear socks and slippers only Rx cephalexin 500 mg by mouth twice a day 7 days  Reappoint 2 weeks or sooner if patient or patient's daughter has concern

## 2016-03-31 DIAGNOSIS — F0391 Unspecified dementia with behavioral disturbance: Secondary | ICD-10-CM | POA: Diagnosis not present

## 2016-03-31 DIAGNOSIS — R1311 Dysphagia, oral phase: Secondary | ICD-10-CM | POA: Diagnosis not present

## 2016-04-01 DIAGNOSIS — F0391 Unspecified dementia with behavioral disturbance: Secondary | ICD-10-CM | POA: Diagnosis not present

## 2016-04-01 DIAGNOSIS — R1311 Dysphagia, oral phase: Secondary | ICD-10-CM | POA: Diagnosis not present

## 2016-04-03 DIAGNOSIS — F0391 Unspecified dementia with behavioral disturbance: Secondary | ICD-10-CM | POA: Diagnosis not present

## 2016-04-03 DIAGNOSIS — R1311 Dysphagia, oral phase: Secondary | ICD-10-CM | POA: Diagnosis not present

## 2016-04-04 DIAGNOSIS — R1311 Dysphagia, oral phase: Secondary | ICD-10-CM | POA: Diagnosis not present

## 2016-04-04 DIAGNOSIS — F0391 Unspecified dementia with behavioral disturbance: Secondary | ICD-10-CM | POA: Diagnosis not present

## 2016-04-05 DIAGNOSIS — F0391 Unspecified dementia with behavioral disturbance: Secondary | ICD-10-CM | POA: Diagnosis not present

## 2016-04-05 DIAGNOSIS — R1311 Dysphagia, oral phase: Secondary | ICD-10-CM | POA: Diagnosis not present

## 2016-04-06 DIAGNOSIS — F0391 Unspecified dementia with behavioral disturbance: Secondary | ICD-10-CM | POA: Diagnosis not present

## 2016-04-06 DIAGNOSIS — R1311 Dysphagia, oral phase: Secondary | ICD-10-CM | POA: Diagnosis not present

## 2016-04-10 DIAGNOSIS — F0391 Unspecified dementia with behavioral disturbance: Secondary | ICD-10-CM | POA: Diagnosis not present

## 2016-04-10 DIAGNOSIS — R1311 Dysphagia, oral phase: Secondary | ICD-10-CM | POA: Diagnosis not present

## 2016-04-12 ENCOUNTER — Encounter (INDEPENDENT_AMBULATORY_CARE_PROVIDER_SITE_OTHER): Payer: Medicare Other | Admitting: Ophthalmology

## 2016-04-12 DIAGNOSIS — H43813 Vitreous degeneration, bilateral: Secondary | ICD-10-CM

## 2016-04-12 DIAGNOSIS — I10 Essential (primary) hypertension: Secondary | ICD-10-CM | POA: Diagnosis not present

## 2016-04-12 DIAGNOSIS — H35033 Hypertensive retinopathy, bilateral: Secondary | ICD-10-CM

## 2016-04-12 DIAGNOSIS — H353231 Exudative age-related macular degeneration, bilateral, with active choroidal neovascularization: Secondary | ICD-10-CM

## 2016-04-12 DIAGNOSIS — F0391 Unspecified dementia with behavioral disturbance: Secondary | ICD-10-CM | POA: Diagnosis not present

## 2016-04-12 DIAGNOSIS — R1311 Dysphagia, oral phase: Secondary | ICD-10-CM | POA: Diagnosis not present

## 2016-04-13 ENCOUNTER — Telehealth: Payer: Self-pay | Admitting: Podiatry

## 2016-04-13 ENCOUNTER — Telehealth: Payer: Self-pay | Admitting: *Deleted

## 2016-04-13 DIAGNOSIS — F0391 Unspecified dementia with behavioral disturbance: Secondary | ICD-10-CM | POA: Diagnosis not present

## 2016-04-13 DIAGNOSIS — R1311 Dysphagia, oral phase: Secondary | ICD-10-CM | POA: Diagnosis not present

## 2016-04-13 MED ORDER — CEPHALEXIN 500 MG PO CAPS: 500.0000 mg | ORAL_CAPSULE | Freq: Two times a day (BID) | ORAL | 0 refills | Status: DC

## 2016-04-13 NOTE — Telephone Encounter (Signed)
pts daughter called stating pts toe is still red and swollen and the wound care nurse at her facility states no drainage. Pts daughter asking if we Dr T could call in a antibiotic for the pt. Daughter states the woundcare nurse at the facility the pt stays changed the orders without talking to Dr T and they are trying to take over pts care without permission from family also.  I offered appt for 1.5.18 to see Dr Jacqualyn Posey but daughter said she could not come with pt and she always comes with pt. I did change appt from 1.10.18 at 330 to 1.9.18 at 945am.   Pts daughter is still insisting a antibiotic to be called in for pt until her appt.

## 2016-04-13 NOTE — Telephone Encounter (Addendum)
Pt's dtr, Lorriane Shire states pt's toe is red and the nursing staff at her assisted living haven't been dressing and have changed the orders to the doctors' on staff at [facility], and she would like an antibiotic called in. Dr. Jacqualyn Posey states call in enough of the Cephalexin to get pt to the 04/17/2016 appt date and tell dtr if the area worsens take pt to the ER. Left message [facility] informing we had gotten a call from pt's dtr concerning red, swollen toe and our doctor-on-call had prescribed Cephalexin 500mg  #8 one capsule bid and pt had an appt 04/17/2016, and to call with concerns. 04/17/2016-Andrea - [facility] states she is calling concerning pt. Unable to speak with Seth Bake, phone "all circuits are busy now, try your call later."04/18/2016-Unable to speak with Hillard Danker, phone states,"Sorry, all circuits are busy now, try your call later."

## 2016-04-14 DIAGNOSIS — R1311 Dysphagia, oral phase: Secondary | ICD-10-CM | POA: Diagnosis not present

## 2016-04-14 DIAGNOSIS — F0391 Unspecified dementia with behavioral disturbance: Secondary | ICD-10-CM | POA: Diagnosis not present

## 2016-04-16 DIAGNOSIS — R1311 Dysphagia, oral phase: Secondary | ICD-10-CM | POA: Diagnosis not present

## 2016-04-16 DIAGNOSIS — F0391 Unspecified dementia with behavioral disturbance: Secondary | ICD-10-CM | POA: Diagnosis not present

## 2016-04-17 ENCOUNTER — Ambulatory Visit (INDEPENDENT_AMBULATORY_CARE_PROVIDER_SITE_OTHER): Payer: Medicare Other | Admitting: Podiatry

## 2016-04-17 ENCOUNTER — Encounter: Payer: Self-pay | Admitting: Podiatry

## 2016-04-17 VITALS — BP 166/78 | HR 67 | Resp 16

## 2016-04-17 DIAGNOSIS — L03031 Cellulitis of right toe: Secondary | ICD-10-CM

## 2016-04-17 DIAGNOSIS — F0391 Unspecified dementia with behavioral disturbance: Secondary | ICD-10-CM | POA: Diagnosis not present

## 2016-04-17 DIAGNOSIS — L02611 Cutaneous abscess of right foot: Secondary | ICD-10-CM | POA: Diagnosis not present

## 2016-04-17 DIAGNOSIS — R1311 Dysphagia, oral phase: Secondary | ICD-10-CM | POA: Diagnosis not present

## 2016-04-17 NOTE — Progress Notes (Signed)
   Subjective:    Patient ID: Debra Tran, female    DOB: [birth year], 83 y.o.   MRN: [id number]  HPI This patient presents with her daughter for follow-up visit of 03/28/2016 at that visit a superficial ulcer with low-grade cellulitis was diagnosed and cephalexin 500 mg by mouth twice a day 7 days was prescribed. Patient per daughter has completed cephalexin, however the local redness persisted. Patient contacted the office on 04/13/2016 and Dr. Earleen Newport prescribed cephalexin 500 mg #8 one twice a day. Patient's daughter contacted home and determined that this prescription was not filled. Patient is daughter still notes some local redness on the medial second right toe   Review of Systems  All other systems reviewed and are negative.      Objective:   Physical Exam  Patient is not responding to questioning and patient's daughter is responding  Patient transfers from wheelchair to treatment chair DP and PT pulses nonpalpable bilaterally Capillary reflex immediate bilaterally Sensation to 10 g monofilament wire intact 1/5 bilaterally Ankle reflexes reactive bilaterally Prominent HAV bunion right HAV bunion left Medial distal second right toe has a localized area of erythema. There is no active drainage,, warmth, malodor The ulcer in this area has closed      Assessment & Plan:   Assessment: Cellulitis localized second right toe with resolved ulceration  Plan: Refill cephalexin 500 mg by mouth twice a day 7 days Apply topical antibiotic ointment and Band-Aid to the second right toe daily  Reevaluate 7 days

## 2016-04-17 NOTE — Patient Instructions (Signed)
There was a telephone prescription for cephalexin 500 mg #8 from a message of 04/13/2016 Apparently the medication was started on 01/09/20189 however patient's daughter state that this medication was not started  Rx cephalexin 500 mg #14 Take one by mouth twice a day  Apply topical antibiotic ointment such as triple antibiotic ointment to the second right toe daily and cover with a [place] 04/09/2016

## 2016-04-18 ENCOUNTER — Ambulatory Visit: Payer: Medicare Other | Admitting: Podiatry

## 2016-04-18 DIAGNOSIS — R1311 Dysphagia, oral phase: Secondary | ICD-10-CM | POA: Diagnosis not present

## 2016-04-18 DIAGNOSIS — F0391 Unspecified dementia with behavioral disturbance: Secondary | ICD-10-CM | POA: Diagnosis not present

## 2016-04-19 DIAGNOSIS — F0391 Unspecified dementia with behavioral disturbance: Secondary | ICD-10-CM | POA: Diagnosis not present

## 2016-04-19 DIAGNOSIS — R1311 Dysphagia, oral phase: Secondary | ICD-10-CM | POA: Diagnosis not present

## 2016-04-20 DIAGNOSIS — R1311 Dysphagia, oral phase: Secondary | ICD-10-CM | POA: Diagnosis not present

## 2016-04-20 DIAGNOSIS — F0391 Unspecified dementia with behavioral disturbance: Secondary | ICD-10-CM | POA: Diagnosis not present

## 2016-04-23 DIAGNOSIS — R1311 Dysphagia, oral phase: Secondary | ICD-10-CM | POA: Diagnosis not present

## 2016-04-23 DIAGNOSIS — F0391 Unspecified dementia with behavioral disturbance: Secondary | ICD-10-CM | POA: Diagnosis not present

## 2016-04-24 ENCOUNTER — Encounter: Payer: Self-pay | Admitting: Podiatry

## 2016-04-24 ENCOUNTER — Ambulatory Visit (INDEPENDENT_AMBULATORY_CARE_PROVIDER_SITE_OTHER): Payer: Medicare Other | Admitting: Podiatry

## 2016-04-24 VITALS — BP 142/81 | HR 65 | Resp 18

## 2016-04-24 DIAGNOSIS — R1311 Dysphagia, oral phase: Secondary | ICD-10-CM | POA: Diagnosis not present

## 2016-04-24 DIAGNOSIS — F0391 Unspecified dementia with behavioral disturbance: Secondary | ICD-10-CM | POA: Diagnosis not present

## 2016-04-24 DIAGNOSIS — L02611 Cutaneous abscess of right foot: Secondary | ICD-10-CM | POA: Diagnosis not present

## 2016-04-24 DIAGNOSIS — L03031 Cellulitis of right toe: Secondary | ICD-10-CM

## 2016-04-24 NOTE — Patient Instructions (Signed)
Today the examination demonstrated residual erythema and slight scaling on the medial border of the second right toe consistent with low-grade cellulitis that has not rest with 2 weeks of oral antibiotics Complete any remaining oral antibiotics and then DC Apply topical antibiotic ointment and Band-Aid to the second right toe daily Apply protective pad over right great toe joint daily Wear soft shoe or slipper Reappoint 7 days  If patient develops any sudden increase in pain, swelling, redness, fever, warmth, drainage present to emergency department  [place] 04/24/2016

## 2016-04-24 NOTE — Progress Notes (Signed)
Patient ID: Debra Tran, female   DOB: 1927/08/03, 83 y.o.   MRN: [id number]   Subjective: This patient presents today for follow-up visit for a localized area of cellulitis on the second right toe. Initial symptoms 03/28/2016. Patient has completed 14 days of cephalexin 500 mg by mouth twice a day. There is no record a complaint or intolerance from medication  Objective: Patient does not respond to questioning   Patient transfers from wheelchair to treatment chair DP and PT pulses nonpalpable bilaterally Capillary reflex immediate bilaterally Sensation to 10 g monofilament wire intact 1/5 bilaterally Ankle reflexes reactive bilaterally Prominent HAV bunion right HAV bunion left Medial distal second right toe has a localized area of erythema. There is no active drainage,, warmth, malodor Crusted 2 mm lesion medial second right toe.  Assessment: Residual low-grade cellulitis second right toe that has not progressed or changed significantly with 2 weeks of oral antibiotics. There is no evidence of ascending cellulitis or lymphangitis  Plan: Complete any remaining oral antibiotics and and DC'd Continue to apply topical antibiotic ointment and Band-Aid daily to the second right toe Apply protective pad over right bunion Wear soft shoe slipper Instructions printed on a AVS also states that if there is any sudden increased pain, swelling, redness, fever, drainage to present to the emergency department  Reappoint 7 days

## 2016-04-26 DIAGNOSIS — R1311 Dysphagia, oral phase: Secondary | ICD-10-CM | POA: Diagnosis not present

## 2016-04-26 DIAGNOSIS — F0391 Unspecified dementia with behavioral disturbance: Secondary | ICD-10-CM | POA: Diagnosis not present

## 2016-04-27 DIAGNOSIS — F0391 Unspecified dementia with behavioral disturbance: Secondary | ICD-10-CM | POA: Diagnosis not present

## 2016-04-27 DIAGNOSIS — R1311 Dysphagia, oral phase: Secondary | ICD-10-CM | POA: Diagnosis not present

## 2016-04-28 DIAGNOSIS — R1311 Dysphagia, oral phase: Secondary | ICD-10-CM | POA: Diagnosis not present

## 2016-04-28 DIAGNOSIS — F0391 Unspecified dementia with behavioral disturbance: Secondary | ICD-10-CM | POA: Diagnosis not present

## 2016-04-30 DIAGNOSIS — F0391 Unspecified dementia with behavioral disturbance: Secondary | ICD-10-CM | POA: Diagnosis not present

## 2016-04-30 DIAGNOSIS — R1311 Dysphagia, oral phase: Secondary | ICD-10-CM | POA: Diagnosis not present

## 2016-05-01 ENCOUNTER — Encounter: Payer: Self-pay | Admitting: Podiatry

## 2016-05-01 ENCOUNTER — Ambulatory Visit (INDEPENDENT_AMBULATORY_CARE_PROVIDER_SITE_OTHER): Payer: Medicare Other | Admitting: Podiatry

## 2016-05-01 VITALS — BP 183/88 | HR 57 | Resp 16

## 2016-05-01 DIAGNOSIS — L03031 Cellulitis of right toe: Secondary | ICD-10-CM | POA: Diagnosis not present

## 2016-05-01 DIAGNOSIS — L02611 Cutaneous abscess of right foot: Secondary | ICD-10-CM

## 2016-05-01 MED ORDER — DOXYCYCLINE HYCLATE 100 MG PO TABS: 100.0000 mg | ORAL_TABLET | Freq: Two times a day (BID) | ORAL | 0 refills | Status: DC

## 2016-05-01 NOTE — Progress Notes (Signed)
Patient ID: Debra Tran, female   DOB: October 20, 1927, 83 y.o.   MRN: [id number]   Subjective: This patient presents today for follow-up visit for a localized area of cellulitis on the second right toe. Initial symptoms 03/28/2016. Patient has completed 14 days of cephalexin 500 mg by mouth twice a day. There is no record a complaint or intolerance from medication  Objective: Patient does not respond to questioning There is no caregiver present to treatment room  Patient transfers from wheelchair to treatment chair DP and PT pulses nonpalpable bilaterally Capillary reflex immediate bilaterally Sensation to 10 g monofilament wire intact 1/5 bilaterally Ankle reflexes reactive bilaterally Prominent HAV bunion right HAV bunion left Medial distalsecond right toe hasa localized area of erythema. There is no active drainage,, warmth, malodor Crusted 2 mm for official ulcer. There is no active drainage, malodor, warmth surrounding this lesion  Assessment: Residual low-grade cellulitis /ulcer second right toe that has not progressed or changed significantly with 2 weeks of oral antibiotics. There is no evidence of ascending cellulitis or lymphangitis Type II diabetic  Plan: Rx doxycycline 100 mg by mouth twice a day 7 days Continue to apply topical antibiotic ointment and Band-Aid daily to the second right toe Apply protective pad over right bunion Wear soft shoe or slipper Instructions printed on a AVS also states that if there is any sudden increased pain, swelling, redness, fever, drainage to present to the emergency department  Reappoint 7 days

## 2016-05-01 NOTE — Patient Instructions (Addendum)
Continue to apply topical antibiotic ointment and Band-Aid daily to skin ulcer on second right toe daily Begin doxycycline 100 mg by mouth one twice a day 7 days Wear soft shoe or slipper on right foot If there is sudden increase of pain, swelling, redness, fever to present to emergency department  Reevaluate 7 days Gean Birchwood DPM [facility] 01/[facility]/[place] 18

## 2016-05-08 ENCOUNTER — Ambulatory Visit: Payer: Medicare Other | Admitting: Podiatry

## 2016-05-09 ENCOUNTER — Non-Acute Institutional Stay (SKILLED_NURSING_FACILITY): Payer: Medicare Other | Admitting: Adult Health

## 2016-05-09 ENCOUNTER — Ambulatory Visit: Payer: Medicare Other | Admitting: Neurology

## 2016-05-09 DIAGNOSIS — E1169 Type 2 diabetes mellitus with other specified complication: Secondary | ICD-10-CM | POA: Diagnosis not present

## 2016-05-09 DIAGNOSIS — Z794 Long term (current) use of insulin: Secondary | ICD-10-CM

## 2016-05-09 DIAGNOSIS — E119 Type 2 diabetes mellitus without complications: Secondary | ICD-10-CM | POA: Diagnosis not present

## 2016-05-09 DIAGNOSIS — E785 Hyperlipidemia, unspecified: Secondary | ICD-10-CM | POA: Diagnosis not present

## 2016-05-09 DIAGNOSIS — G458 Other transient cerebral ischemic attacks and related syndromes: Secondary | ICD-10-CM | POA: Diagnosis not present

## 2016-05-09 DIAGNOSIS — I10 Essential (primary) hypertension: Secondary | ICD-10-CM | POA: Diagnosis not present

## 2016-05-09 DIAGNOSIS — F0390 Unspecified dementia without behavioral disturbance: Secondary | ICD-10-CM | POA: Diagnosis not present

## 2016-05-09 DIAGNOSIS — G2401 Drug induced subacute dyskinesia: Secondary | ICD-10-CM

## 2016-05-09 DIAGNOSIS — F209 Schizophrenia, unspecified: Secondary | ICD-10-CM

## 2016-05-15 ENCOUNTER — Ambulatory Visit: Payer: Medicare Other | Admitting: Podiatry

## 2016-05-16 DIAGNOSIS — E785 Hyperlipidemia, unspecified: Secondary | ICD-10-CM | POA: Diagnosis not present

## 2016-05-17 DIAGNOSIS — F0391 Unspecified dementia with behavioral disturbance: Secondary | ICD-10-CM | POA: Diagnosis not present

## 2016-05-17 DIAGNOSIS — F329 Major depressive disorder, single episode, unspecified: Secondary | ICD-10-CM | POA: Diagnosis not present

## 2016-05-17 DIAGNOSIS — F259 Schizoaffective disorder, unspecified: Secondary | ICD-10-CM | POA: Diagnosis not present

## 2016-06-05 ENCOUNTER — Encounter: Payer: Self-pay | Admitting: Adult Health

## 2016-06-05 ENCOUNTER — Non-Acute Institutional Stay (SKILLED_NURSING_FACILITY): Payer: Medicare Other | Admitting: Adult Health

## 2016-06-05 DIAGNOSIS — F209 Schizophrenia, unspecified: Secondary | ICD-10-CM | POA: Diagnosis not present

## 2016-06-05 DIAGNOSIS — K219 Gastro-esophageal reflux disease without esophagitis: Secondary | ICD-10-CM | POA: Diagnosis not present

## 2016-06-05 DIAGNOSIS — I10 Essential (primary) hypertension: Secondary | ICD-10-CM | POA: Diagnosis not present

## 2016-06-05 DIAGNOSIS — G458 Other transient cerebral ischemic attacks and related syndromes: Secondary | ICD-10-CM | POA: Diagnosis not present

## 2016-06-05 DIAGNOSIS — F0390 Unspecified dementia without behavioral disturbance: Secondary | ICD-10-CM

## 2016-06-05 DIAGNOSIS — E1169 Type 2 diabetes mellitus with other specified complication: Secondary | ICD-10-CM

## 2016-06-05 DIAGNOSIS — E119 Type 2 diabetes mellitus without complications: Secondary | ICD-10-CM

## 2016-06-05 DIAGNOSIS — Z794 Long term (current) use of insulin: Secondary | ICD-10-CM | POA: Diagnosis not present

## 2016-06-05 DIAGNOSIS — G2401 Drug induced subacute dyskinesia: Secondary | ICD-10-CM | POA: Diagnosis not present

## 2016-06-05 DIAGNOSIS — E785 Hyperlipidemia, unspecified: Secondary | ICD-10-CM | POA: Diagnosis not present

## 2016-06-05 NOTE — Progress Notes (Signed)
Location:   [facility] Room Number: 225 B Place of Service:  SNF (31)   CODE STATUS:  Full Code  Allergies  Allergen Reactions  . Ace Inhibitors   . Ether Nausea And Vomiting    Chief Complaint  Patient presents with  . Medical Management of Chronic Issues    Routine Visit    HPI:  She is a long term resident of this facility being seen for the management of her chronic illnesses. She tells me today that she is not feeling good; and that she did not sleep well last night. There are no specific complaints; no reports of any change in her appetite. There are no nursing concerns at this time.    Past Medical History:  Diagnosis Date  . Allergic rhinitis   . CATARACT 06/06/2006   Qualifier: Diagnosis of  By: Genene Churn MD, Janett Billow    . Depressive disorder   . Diabetes mellitus type 2 in obese ([place])   . Fungal dermatitis   . Hyperlipidemia   . Hypertension   . Macular degeneration   . Mild cognitive impairment   . Narcissism   . Normocytic anemia   . Phobia   . Schizophrenia, paranoid ([place])   . Stress incontinence, female     Past Surgical History:  Procedure Laterality Date  . ABDOMINAL HYSTERECTOMY     due to fibroids  . CATARACT EXTRACTION  6 16 2006  . CRYOTHERAPY  2 15 2006   facial AK  . hyperplastic   4 20 2006   AK shave biopsy  . intraocular injection  [id number]  . TOTAL ABDOMINAL HYSTERECTOMY W/ BILATERAL SALPINGOOPHORECTOMY     due to endometriosis  non malignant    Social History   Social History  . Marital status: Divorced    Spouse name: N/A  . Number of children: N/A  . Years of education: N/A   Occupational History  . Not on file.   Social History Main Topics  . Smoking status: Former Research scientist (life sciences)  . Smokeless tobacco: Former Systems developer    Types: Chew  . Alcohol use No  . Drug use: No  . Sexual activity: Not on file   Other Topics Concern  . Not on file   Social History Narrative   Lives in [facility] (assisted living),  remotely smoked and chewed tobacco. Daughter([name])  drives her, independent of all ADL's   Family History  Problem Relation Age of Onset  . Heart disease Mother   . Heart disease Father   . Stroke Father   . Cancer Cousin     liver and colon  . Stroke Sister     Vitals:   06/05/16 1457  BP: (!) 110/58  Pulse: 70  Resp: 20  Temp: 97 F (36.1 C)  SpO2: 99%  Weight: 168 lb 9.6 oz (76.5 kg)     Patient's Medications  New Prescriptions   No medications on file  Previous Medications   ACETAMINOPHEN (TYLENOL) 325 MG TABLET    Take 650 mg by mouth every 4 (four) hours as needed.   AMINO ACIDS-PROTEIN HYDROLYS PO    Take 30 mLs by mouth 2 (two) times daily.   ASPIRIN EC 325 MG TABLET    Take 1 tablet (325 mg total) by mouth daily.   ATORVASTATIN (LIPITOR) 20 MG TABLET    Take 1 tablet (20 mg total) by mouth at bedtime.   BENZTROPINE (COGENTIN) 1 MG TABLET    Take 1 mg by  mouth at bedtime.   CALCIUM CARBONATE-VITAMIN D (CALCIUM 600-D) 600-400 MG-UNIT PER TABLET    Take 1 tablet by mouth every morning.    CETIRIZINE (ZYRTEC) 10 MG TABLET    Take 10 mg by mouth daily.   CHOLECALCIFEROL (VITAMIN D) 1000 UNITS TABLET    Take 1,000 Units by mouth daily.   CYANOCOBALAMIN 1000 MCG TABLET    Take 100 mcg by mouth daily.   DOCUSATE SODIUM (COLACE) 100 MG CAPSULE    Take 1 capsule (100 mg total) by mouth daily. For constipation   DONEPEZIL (ARICEPT) 10 MG TABLET    Take 10 mg by mouth at bedtime.   ESCITALOPRAM (LEXAPRO) 10 MG TABLET    Take 20 mg by mouth at bedtime. For depression   FLUTICASONE (FLONASE) 50 MCG/ACT NASAL SPRAY    Place 1 spray into both nostrils daily.   GLIPIZIDE (GLUCOTROL) 10 MG TABLET    Take 5 mg by mouth 2 (two) times daily before a meal.    GUAIFENESIN (MUCINEX) 600 MG 12 HR TABLET    Take 600 mg by mouth 2 (two) times daily as needed.   HYPROMELLOSE 0.4 % SOLN    Place 1 drop into both eyes 3 (three) times daily.   INSULIN ASPART (NOVOLOG) 100 UNIT/ML INJECTION     Inject 5 Units into the skin 3 (three) times daily before meals.   INSULIN DETEMIR (LEVEMIR FLEXPEN) 100 UNIT/ML PEN    Inject 7 Units into the skin at bedtime.    KETOTIFEN FUMARATE (ZADITOR OP)    Place 1 drop into both eyes 2 (two) times daily.   LOSARTAN (COZAAR) 25 MG TABLET    Take 25 mg by mouth daily.   MEMANTINE HCL ER (NAMENDA XR) 28 MG CP24    Take 28 mg by mouth daily. For dementia   MULTIPLE VITAMIN (MULTIVITAMIN WITH MINERALS) TABS    Take 1 tablet by mouth every morning.   NUTRITIONAL SUPPLEMENTS (NUTRITIONAL SUPPLEMENT PO)    Take by mouth. HSG Puree texture, Nectar consistency   OLANZAPINE (ZYPREXA) 7.5 MG TABLET    Take 7.5 mg by mouth at bedtime.   OMEGA-3 FATTY ACIDS (FISH OIL) 1000 MG CAPS    Take 1,000 mg by mouth daily.   PANTOPRAZOLE (PROTONIX) 40 MG TABLET    Take 40 mg by mouth daily.   PROTEIN POWD    Take 2 scoop by mouth 2 (two) times daily.   TRIAMCINOLONE (KENALOG) 0.025 % OINTMENT    Apply 1 application topically daily. Apply to arms for skin integrity  Modified Medications   No medications on file  Discontinued Medications   CEPHALEXIN (KEFLEX) 500 MG CAPSULE    Take 1 capsule (500 mg total) by mouth 2 (two) times daily.   CEPHALEXIN (KEFLEX) 500 MG CAPSULE    Take 1 capsule (500 mg total) by mouth 2 (two) times daily.   DOXYCYCLINE (VIBRA-TABS) 100 MG TABLET    Take 1 tablet (100 mg total) by mouth 2 (two) times daily.   OLANZAPINE (ZYPREXA) 5 MG TABLET    Take 10 mg by mouth at bedtime.      SIGNIFICANT DIAGNOSTIC EXAMS  08-19-14: mammogram: scattered fibroglandular tissue   12-30-14: right eye intraocular injection   02-06-16: ct of head: Atrophy and small vessel disease, similar to priors. No acute intracranial findings.  02-06-16: mri of brain: Atrophy and small vessel disease, similar to priors. No acute stroke is evident.  02-06-16: ct angio of head and neck:  CT HEADNo intracranial hemorrhage or CT evidence of large acute infarct. Chronic  microvascular changes. No intracranial mass abnormal enhancement. Global atrophy without hydrocephalus.  CTA NECK Plaque right carotid bifurcation and proximal right internal carotid artery with less than 50% diameter narrowing. Mild plaque proximal left internal carotid artery with less than 50% diameter narrowing. Plaque with mild narrowing origin of vertebral arteries bilaterally. Left vertebral artery slightly dominant size. CTA HEAD Plaque with mild narrowing cavernous segment internal carotid artery bilaterally. Anterior circulation without medium or large size vessel significant stenosis or occlusion. Mild narrowing distal right vertebral artery. No significant stenosis left vertebral artery or basilar artery.    03-12-16: EEG: This is a mildly abnormal EEG due to the presence of  moderate generalized slowing which is indicative of bihemispheric dysfunction which is a nonspecific finding seen in a variety of degenerative, hypoxic, ischemic, toxic, metabolic etiologies. No definite epileptiform activity is noted   LABS REVIEWED:  07-12-15: wbc 8.2; hgb 12.7; hct 38.1; mcv 84.2; plt 229; glucose 94; bun 13.0; creat 0.72; k+ 4.4; na++ 141; liver normal albumin 3.4; hgb a1c 6.4; chol 135; ldl 64; trig 130; hdl 45  12-09-15: glucose 256; bun 14.6; creat 1.01; k+ 4.5; na++ 137; liver normal albumin 3.5; chol 156; ldl 61; trig 246; hdl 45  hgb a1c 7.6  02-06-16: wbc 5.6; hgb 13.0; hct 40.3; mcv 86.1; plt 149; glucose 239; bun 8; creat 0.84; k+ 4.0; na++ 132; liver normal albumin 3.1; blood culture: no growth; UA: neg 02-15-16: pre-albumin 10  03-14-16: urine micro-albumin <1.2  03-27-16: glucose 126; bun 11.7; creat 0.98; k+ 4.5; na++ 143; hgb a1c 7.8     Review of Systems Constitutional: Negative for malaise/fatigue.  Respiratory: Negative for cough and shortness of breath.   Cardiovascular: Negative for chest pain, palpitations and leg swelling.  Gastrointestinal: Negative for heartburn,  abdominal pain and constipation.  Musculoskeletal: Negative for myalgias and joint pain.  Skin: Negative.   Neurological: Negative for headaches.  Psychiatric/Behavioral: The patient is not nervous/anxious.       Physical Exam Constitutional: No distress.  Overweight   Neck: Neck supple. No JVD present. No thyromegaly present.  Cardiovascular: Normal rate, regular rhythm and intact distal pulses.   Respiratory: Effort normal and breath sounds normal. No respiratory distress.  GI: Soft. Bowel sounds are normal. She exhibits no distension. There is no tenderness.  Musculoskeletal: She exhibits no edema.  Is able to move all extremities Has very little abnormal tongue movement  Neurological: She is alert.  Skin: Skin is warm and dry. She is not diaphoretic.    ASSESSMENT/ PLAN:   1. Dementia without behavior disturbance; is without change will continue aricept 10 mg daily and namenda xr 28 mg daily will not make changes will monitor   Her current weight is 168  pounds and is currently stable.   2. Tremor: she does not have a resting tremor present she does have a small amount of  lip tremor. Will continue cogentin 1 mg nightly   3. Dyslipidemia: will continue lipitor 20 mg daily fish oil 1 gm daily trig 246;  ldl is 61  4. Diabetes  hgb a1c 7.8  Will continue levemir  5 units;  will continue glipizide 5 mg twice daily  novolog  5 units with meals    5. Gerd: will continue protonix 40 mg daily   6. Constipation: will continue colace daily    7. Depression: will continue lexapro 20 mg nightly will monitor  8. Schizophrenia: is stable will continue zyprexa 7.5 mg nightly and will monitor   Is followed by psych services.  Will continue cogentin 1 mg nightly for tardive dyskinesia and will monitor  .   9. Macular degeneration: will contine   zaditor to both eyes twice daily   10. Allergic rhinitis: will continue flonase to both nares daily and zyrtec 10 mg daily   11. TIA: is  presently stable; will continue asa 325 mg daily   12. Hypertension: will continue lisinopril 5 mg daily  Will check urine for micro-albumin and hgb a1c    MD is aware of resident's narcotic use and is in agreement with current plan of care. We will attempt to wean resident as apropriate    Ok Edwards NP [facility]  Contact [phone] Monday through Friday 8am- 5pm  After hours call [phone]

## 2016-06-08 DIAGNOSIS — F259 Schizoaffective disorder, unspecified: Secondary | ICD-10-CM | POA: Diagnosis not present

## 2016-06-08 DIAGNOSIS — F0391 Unspecified dementia with behavioral disturbance: Secondary | ICD-10-CM | POA: Diagnosis not present

## 2016-06-08 DIAGNOSIS — F329 Major depressive disorder, single episode, unspecified: Secondary | ICD-10-CM | POA: Diagnosis not present

## 2016-06-10 NOTE — Progress Notes (Signed)
Location:   [facility]   Place of Service:  SNF (31)   CODE STATUS: full code  Allergies  Allergen Reactions  . Ace Inhibitors   . Ether Nausea And Vomiting    Chief Complaint  Patient presents with  . Medical Management of Chronic Issues    HPI:  She is a long term resident of this facility being seen for the management of her chronic illnesses. She does complain of a dry hacky cough.  She has been on an ace which was recently stopped. She did say that her cough is getting better. There are no nursing concerns today.    Past Medical History:  Diagnosis Date  . Allergic rhinitis   . CATARACT 06/06/2006   Qualifier: Diagnosis of  By: Genene Churn MD, Janett Billow    . Depressive disorder   . Diabetes mellitus type 2 in obese ([place])   . Fungal dermatitis   . Hyperlipidemia   . Hypertension   . Macular degeneration   . Mild cognitive impairment   . Narcissism   . Normocytic anemia   . Phobia   . Schizophrenia, paranoid ([place])   . Stress incontinence, female     Past Surgical History:  Procedure Laterality Date  . ABDOMINAL HYSTERECTOMY     due to fibroids  . CATARACT EXTRACTION  6 16 2006  . CRYOTHERAPY  2 15 2006   facial AK  . hyperplastic   4 20 2006   AK shave biopsy  . intraocular injection  [id number]  . TOTAL ABDOMINAL HYSTERECTOMY W/ BILATERAL SALPINGOOPHORECTOMY     due to endometriosis  non malignant    Social History   Social History  . Marital status: Divorced    Spouse name: N/A  . Number of children: N/A  . Years of education: N/A   Occupational History  . Not on file.   Social History Main Topics  . Smoking status: Former Research scientist (life sciences)  . Smokeless tobacco: Former Systems developer    Types: Chew  . Alcohol use No  . Drug use: No  . Sexual activity: Not on file   Other Topics Concern  . Not on file   Social History Narrative   Lives in [facility] (assisted living), remotely smoked and chewed tobacco. Daughter([name])  drives her, independent of all  ADL's   Family History  Problem Relation Age of Onset  . Heart disease Mother   . Heart disease Father   . Stroke Father   . Cancer Cousin     liver and colon  . Stroke Sister       VITAL SIGNS BP (!) 154/91   Pulse 72   Temp 98.4 F (36.9 C)   Resp 18   Ht 4\' 9"  (1.448 m)   Wt 171 lb 9.6 oz (77.8 kg)   SpO2 97%   BMI 37.13 kg/m   Patient's Medications  New Prescriptions   No medications on file  Previous Medications   ACETAMINOPHEN (TYLENOL) 325 MG TABLET    Take 650 mg by mouth every 4 (four) hours as needed.   AMINO ACIDS-PROTEIN HYDROLYS PO    Take 30 mLs by mouth 2 (two) times daily.   ASPIRIN EC 325 MG TABLET    Take 1 tablet (325 mg total) by mouth daily.   ATORVASTATIN (LIPITOR) 20 MG TABLET    Take 1 tablet (20 mg total) by mouth at bedtime.   BENZTROPINE (COGENTIN) 1 MG TABLET    Take 1 mg by mouth at  bedtime.   CALCIUM CARBONATE-VITAMIN D (CALCIUM 600-D) 600-400 MG-UNIT PER TABLET    Take 1 tablet by mouth every morning.    CETIRIZINE (ZYRTEC) 10 MG TABLET    Take 10 mg by mouth daily.   CHOLECALCIFEROL (VITAMIN D) 1000 UNITS TABLET    Take 1,000 Units by mouth daily.   CYANOCOBALAMIN 1000 MCG TABLET    Take 100 mcg by mouth daily.   DOCUSATE SODIUM (COLACE) 100 MG CAPSULE    Take 1 capsule (100 mg total) by mouth daily. For constipation   DONEPEZIL (ARICEPT) 10 MG TABLET    Take 10 mg by mouth at bedtime.   ESCITALOPRAM (LEXAPRO) 10 MG TABLET    Take 20 mg by mouth at bedtime. For depression   FLUTICASONE (FLONASE) 50 MCG/ACT NASAL SPRAY    Place 1 spray into both nostrils daily.   GLIPIZIDE (GLUCOTROL) 10 MG TABLET    Take 5 mg by mouth 2 (two) times daily before a meal.    GUAIFENESIN (MUCINEX) 600 MG 12 HR TABLET    Take 600 mg by mouth 2 (two) times daily as needed.   HYPROMELLOSE 0.4 % SOLN    Place 1 drop into both eyes 3 (three) times daily.   INSULIN ASPART (NOVOLOG) 100 UNIT/ML INJECTION    Inject 5 Units into the skin 3 (three) times daily before  meals.   INSULIN DETEMIR (LEVEMIR FLEXPEN) 100 UNIT/ML PEN    Inject 7 Units into the skin at bedtime.    KETOTIFEN FUMARATE (ZADITOR OP)    Place 1 drop into both eyes 2 (two) times daily.   MEMANTINE HCL ER (NAMENDA XR) 28 MG CP24    Take 28 mg by mouth daily. For dementia   MULTIPLE VITAMIN (MULTIVITAMIN WITH MINERALS) TABS    Take 1 tablet by mouth every morning.   NUTRITIONAL SUPPLEMENTS (NUTRITIONAL SUPPLEMENT PO)    Take by mouth. HSG Puree texture, Nectar consistency   OLANZAPINE (ZYPREXA) 7.5 MG TABLET    Take 7.5 mg by mouth at bedtime.   OMEGA-3 FATTY ACIDS (FISH OIL) 1000 MG CAPS    Take 1,000 mg by mouth daily.   PANTOPRAZOLE (PROTONIX) 40 MG TABLET    Take 40 mg by mouth daily.   PROTEIN POWD    Take 2 scoop by mouth 2 (two) times daily.   TRIAMCINOLONE (KENALOG) 0.025 % OINTMENT    Apply 1 application topically daily. Apply to arms for skin integrity  Modified Medications   No medications on file  Discontinued Medications   No medications on file     SIGNIFICANT DIAGNOSTIC EXAMS   08-19-14: mammogram: scattered fibroglandular tissue   12-30-14: right eye intraocular injection   02-06-16: ct of head: Atrophy and small vessel disease, similar to priors. No acute intracranial findings.  02-06-16: mri of brain: Atrophy and small vessel disease, similar to priors. No acute stroke is evident.  02-06-16: ct angio of head and neck: CT HEADNo intracranial hemorrhage or CT evidence of large acute infarct. Chronic microvascular changes. No intracranial mass abnormal enhancement. Global atrophy without hydrocephalus.  CTA NECK Plaque right carotid bifurcation and proximal right internal carotid artery with less than 50% diameter narrowing. Mild plaque proximal left internal carotid artery with less than 50% diameter narrowing. Plaque with mild narrowing origin of vertebral arteries bilaterally. Left vertebral artery slightly dominant size. CTA HEAD Plaque with mild narrowing  cavernous segment internal carotid artery bilaterally. Anterior circulation without medium or large size vessel significant stenosis or occlusion.  Mild narrowing distal right vertebral artery. No significant stenosis left vertebral artery or basilar artery.    03-12-16: EEG: This is a mildly abnormal EEG due to the presence of  moderate generalized slowing which is indicative of bihemispheric dysfunction which is a nonspecific finding seen in a variety of degenerative, hypoxic, ischemic, toxic, metabolic etiologies. No definite epileptiform activity is noted   LABS REVIEWED:  07-12-15: wbc 8.2; hgb 12.7; hct 38.1; mcv 84.2; plt 229; glucose 94; bun 13.0; creat 0.72; k+ 4.4; na++ 141; liver normal albumin 3.4; hgb a1c 6.4; chol 135; ldl 64; trig 130; hdl 45  12-09-15: glucose 256; bun 14.6; creat 1.01; k+ 4.5; na++ 137; liver normal albumin 3.5; chol 156; ldl 61; trig 246; hdl 45  hgb a1c 7.6  02-06-16: wbc 5.6; hgb 13.0; hct 40.3; mcv 86.1; plt 149; glucose 239; bun 8; creat 0.84; k+ 4.0; na++ 132; liver normal albumin 3.1; blood culture: no growth; UA: neg 02-15-16: pre-albumin 10    Review of Systems Constitutional: Negative for malaise/fatigue.  Respiratory: Negative for cough and shortness of breath.   Cardiovascular: Negative for chest pain, palpitations and leg swelling.  Gastrointestinal: Negative for heartburn, abdominal pain and constipation.  Musculoskeletal: Negative for myalgias and joint pain.  Skin: Negative.   Neurological: Negative for headaches.  Psychiatric/Behavioral: The patient is not nervous/anxious.       Physical Exam Constitutional: No distress.  Overweight   Neck: Neck supple. No JVD present. No thyromegaly present.  Cardiovascular: Normal rate, regular rhythm and intact distal pulses.   Respiratory: Effort normal and breath sounds normal. No respiratory distress.  GI: Soft. Bowel sounds are normal. She exhibits no distension. There is no tenderness.    Musculoskeletal: She exhibits no edema.  Is able to move all extremities Has very little abnormal tongue movement  Neurological: She is alert.  Skin: Skin is warm and dry. She is not diaphoretic.    ASSESSMENT/ PLAN:   1. Dementia without behavior disturbance; is without change will continue aricept 10 mg daily and namenda xr 28 mg daily will not make changes will monitor   Her current weight is 171  pounds and is currently stable.   2. Tremor: she does not have a resting tremor present she does have a small amount of  lip tremor. Will continue cogentin 1 mg nightly   3. Dyslipidemia: will continue lipitor 20 mg daily fish oil 1 gm daily trig 246;  ldl is 61  4. Diabetes  hgb a1c 7.6  Will continue levemir  5 units;  will continue glipizide 5 mg twice daily  novolog  5 units with meals    5. Gerd: will continue protonix 40 mg daily   6. Constipation: will continue colace daily    7. Depression: will continue lexapro 20 mg nightly will monitor   8. Schizophrenia: is stable will continue zyprexa 7.5 mg nightly and will monitor   Is followed by psych services.  Will continue cogentin 1 mg nightly for tardive dyskinesia and will monitor    9. Macular degeneration: will contine   zaditor to both eyes twice daily   10. Allergic rhinitis: will continue flonase to both nares daily and zyrtec 10 mg daily   11. TIA: is presently stable; will continue asa 325 mg daily   12. Hypertension: she is off lisinopril; will begin cozaar 25 mg daily and will monitor her status.    In one week will check cbc; cmp   MD is aware of  resident's narcotic use and is in agreement with current plan of care. We will attempt to wean resident as apropriate   Ok Edwards NP [facility]  Contact [phone] Monday through Friday 8am- 5pm  After hours call [phone]

## 2016-06-29 ENCOUNTER — Non-Acute Institutional Stay (SKILLED_NURSING_FACILITY): Payer: Medicare Other | Admitting: Adult Health

## 2016-06-29 ENCOUNTER — Encounter: Payer: Self-pay | Admitting: Adult Health

## 2016-06-29 DIAGNOSIS — E785 Hyperlipidemia, unspecified: Secondary | ICD-10-CM | POA: Diagnosis not present

## 2016-06-29 DIAGNOSIS — K219 Gastro-esophageal reflux disease without esophagitis: Secondary | ICD-10-CM | POA: Diagnosis not present

## 2016-06-29 DIAGNOSIS — F209 Schizophrenia, unspecified: Secondary | ICD-10-CM

## 2016-06-29 DIAGNOSIS — G458 Other transient cerebral ischemic attacks and related syndromes: Secondary | ICD-10-CM | POA: Diagnosis not present

## 2016-06-29 DIAGNOSIS — E1169 Type 2 diabetes mellitus with other specified complication: Secondary | ICD-10-CM | POA: Diagnosis not present

## 2016-06-29 DIAGNOSIS — Z794 Long term (current) use of insulin: Secondary | ICD-10-CM

## 2016-06-29 DIAGNOSIS — E119 Type 2 diabetes mellitus without complications: Secondary | ICD-10-CM

## 2016-06-29 NOTE — Progress Notes (Signed)
Location:   [facility] Room Number: 225 B Place of Service:  SNF (31)   CODE STATUS: Full Code  Allergies  Allergen Reactions  . Ace Inhibitors   . Ether Nausea And Vomiting    Chief Complaint  Patient presents with  . Medical Management of Chronic Issues    1 month follow up    HPI:  She is a long term resident of this facility being seen for the management of her chronic illnesses. Overall her status is stable. She does get out of bed daily; she does socialize with others. There are no nursing concerns at this time,    Past Medical History:  Diagnosis Date  . Allergic rhinitis   . CATARACT 06/06/2006   Qualifier: Diagnosis of  By: Genene Churn MD, Janett Billow    . Depressive disorder   . Diabetes mellitus type 2 in obese ([place])   . Fungal dermatitis   . Hyperlipidemia   . Hypertension   . Macular degeneration   . Mild cognitive impairment   . Narcissism   . Normocytic anemia   . Phobia   . Schizophrenia, paranoid ([place])   . Stress incontinence, female     Past Surgical History:  Procedure Laterality Date  . ABDOMINAL HYSTERECTOMY     due to fibroids  . CATARACT EXTRACTION  6 16 2006  . CRYOTHERAPY  2 15 2006   facial AK  . hyperplastic   4 20 2006   AK shave biopsy  . intraocular injection  [id number]  . TOTAL ABDOMINAL HYSTERECTOMY W/ BILATERAL SALPINGOOPHORECTOMY     due to endometriosis  non malignant    Social History   Social History  . Marital status: Divorced    Spouse name: N/A  . Number of children: N/A  . Years of education: N/A   Occupational History  . Not on file.   Social History Main Topics  . Smoking status: Former Research scientist (life sciences)  . Smokeless tobacco: Former Systems developer    Types: Chew  . Alcohol use No  . Drug use: No  . Sexual activity: Not on file   Other Topics Concern  . Not on file   Social History Narrative   Lives in [facility] (assisted living), remotely smoked and chewed tobacco. Daughter([name])  drives her,  independent of all ADL's   Family History  Problem Relation Age of Onset  . Heart disease Mother   . Heart disease Father   . Stroke Father   . Cancer Cousin     liver and colon  . Stroke Sister       VITAL SIGNS BP (!) 146/78   Pulse 76   Temp 98.3 F (36.8 C)   Resp 18   Ht 4\' 9"  (1.448 m)   Wt 168 lb 3.2 oz (76.3 kg)   SpO2 98%   BMI 36.40 kg/m   Patient's Medications  New Prescriptions   No medications on file  Previous Medications   ACETAMINOPHEN (TYLENOL) 325 MG TABLET    Take 650 mg by mouth every 4 (four) hours as needed.   AMINO ACIDS-PROTEIN HYDROLYS PO    Take 30 mLs by mouth 2 (two) times daily.   ASPIRIN EC 325 MG TABLET    Take 1 tablet (325 mg total) by mouth daily.   ATORVASTATIN (LIPITOR) 20 MG TABLET    Take 1 tablet (20 mg total) by mouth at bedtime.   BENZTROPINE (COGENTIN) 1 MG TABLET    Take 1 mg by mouth  at bedtime.   CALCIUM CARBONATE-VITAMIN D (CALCIUM 600-D) 600-400 MG-UNIT PER TABLET    Take 1 tablet by mouth every morning.    CETIRIZINE (ZYRTEC) 10 MG TABLET    Take 10 mg by mouth daily.   CHOLECALCIFEROL (VITAMIN D) 1000 UNITS TABLET    Take 1,000 Units by mouth daily.   CYANOCOBALAMIN 1000 MCG TABLET    Take 100 mcg by mouth daily.   DOCUSATE SODIUM (COLACE) 100 MG CAPSULE    Take 1 capsule (100 mg total) by mouth daily. For constipation   DONEPEZIL (ARICEPT) 10 MG TABLET    Take 10 mg by mouth at bedtime.   ESCITALOPRAM (LEXAPRO) 10 MG TABLET    Take 20 mg by mouth at bedtime. For depression   FLUTICASONE (FLONASE) 50 MCG/ACT NASAL SPRAY    Place 1 spray into both nostrils daily.   GLIPIZIDE (GLUCOTROL) 5 MG TABLET    Take 5 mg by mouth 2 (two) times daily.   GUAIFENESIN (MUCINEX) 600 MG 12 HR TABLET    Take 600 mg by mouth 2 (two) times daily as needed.   HYPROMELLOSE 0.4 % SOLN    Place 1 drop into both eyes 3 (three) times daily.   INSULIN ASPART (NOVOLOG) 100 UNIT/ML INJECTION    Inject 5 Units into the skin 3 (three) times daily before  meals.   INSULIN DETEMIR (LEVEMIR FLEXPEN) 100 UNIT/ML PEN    Inject 7 Units into the skin at bedtime.    KETOTIFEN FUMARATE (ZADITOR OP)    Place 1 drop into both eyes 2 (two) times daily.   LOSARTAN (COZAAR) 25 MG TABLET    Take 25 mg by mouth daily.   MEMANTINE HCL ER (NAMENDA XR) 28 MG CP24    Take 28 mg by mouth daily. For dementia   MULTIPLE VITAMINS-MINERALS (DECUBI-VITE) CAPS    Take 1 capsule by mouth daily.   NUTRITIONAL SUPPLEMENTS (NUTRITIONAL SUPPLEMENT PO)    Take by mouth. HSG Puree texture, Nectar consistency   OLANZAPINE (ZYPREXA) 7.5 MG TABLET    Take 7.5 mg by mouth at bedtime.   OMEGA-3 FATTY ACIDS (FISH OIL) 1000 MG CAPS    Take 1,000 mg by mouth daily.   PANTOPRAZOLE (PROTONIX) 40 MG TABLET    Take 40 mg by mouth daily.   PROTEIN POWD    Take 2 scoop by mouth 2 (two) times daily.   TRIAMCINOLONE (KENALOG) 0.025 % OINTMENT    Apply 1 application topically daily. Apply to arms for skin integrity  Modified Medications   No medications on file  Discontinued Medications   GLIPIZIDE (GLUCOTROL) 10 MG TABLET    Take 5 mg by mouth 2 (two) times daily before a meal.    MULTIPLE VITAMIN (MULTIVITAMIN WITH MINERALS) TABS    Take 1 tablet by mouth every morning.     SIGNIFICANT DIAGNOSTIC EXAMS  08-19-14: mammogram: scattered fibroglandular tissue   12-30-14: right eye intraocular injection   02-06-16: ct of head: Atrophy and small vessel disease, similar to priors. No acute intracranial findings.  02-06-16: mri of brain: Atrophy and small vessel disease, similar to priors. No acute stroke is evident.  02-06-16: ct angio of head and neck: CT HEADNo intracranial hemorrhage or CT evidence of large acute infarct. Chronic microvascular changes. No intracranial mass abnormal enhancement. Global atrophy without hydrocephalus.  CTA NECK Plaque right carotid bifurcation and proximal right internal carotid artery with less than 50% diameter narrowing. Mild plaque proximal left  internal carotid artery with  less than 50% diameter narrowing. Plaque with mild narrowing origin of vertebral arteries bilaterally. Left vertebral artery slightly dominant size. CTA HEAD Plaque with mild narrowing cavernous segment internal carotid artery bilaterally. Anterior circulation without medium or large size vessel significant stenosis or occlusion. Mild narrowing distal right vertebral artery. No significant stenosis left vertebral artery or basilar artery.    03-12-16: EEG: This is a mildly abnormal EEG due to the presence of  moderate generalized slowing which is indicative of bihemispheric dysfunction which is a nonspecific finding seen in a variety of degenerative, hypoxic, ischemic, toxic, metabolic etiologies. No definite epileptiform activity is noted   LABS REVIEWED:  07-12-15: wbc 8.2; hgb 12.7; hct 38.1; mcv 84.2; plt 229; glucose 94; bun 13.0; creat 0.72; k+ 4.4; na++ 141; liver normal albumin 3.4; hgb a1c 6.4; chol 135; ldl 64; trig 130; hdl 45  12-09-15: glucose 256; bun 14.6; creat 1.01; k+ 4.5; na++ 137; liver normal albumin 3.5; chol 156; ldl 61; trig 246; hdl 45  hgb a1c 7.6  02-06-16: wbc 5.6; hgb 13.0; hct 40.3; mcv 86.1; plt 149; glucose 239; bun 8; creat 0.84; k+ 4.0; na++ 132; liver normal albumin 3.1; blood culture: no growth; UA: neg 02-15-16: pre-albumin 10  03-14-16: urine micro-albumin <1.2  03-27-16: glucose 126; bun 11.7; creat 0.98; k+ 4.5; na++ 143; hgb a1c 7.8     Review of Systems Constitutional: Negative for malaise/fatigue.  Respiratory: Negative for cough and shortness of breath.   Cardiovascular: Negative for chest pain, palpitations and leg swelling.  Gastrointestinal: Negative for heartburn, abdominal pain and constipation.  Musculoskeletal: Negative for myalgias and joint pain.  Skin: Negative.   Neurological: Negative for headaches.  Psychiatric/Behavioral: The patient is not nervous/anxious.       Physical Exam Constitutional: No  distress.  Overweight   Neck: Neck supple. No JVD present. No thyromegaly present.  Cardiovascular: Normal rate, regular rhythm and intact distal pulses.   Respiratory: Effort normal and breath sounds normal. No respiratory distress.  GI: Soft. Bowel sounds are normal. She exhibits no distension. There is no tenderness.  Musculoskeletal: She exhibits no edema.  Is able to move all extremities Has very little abnormal tongue movement  Neurological: She is alert.  Skin: Skin is warm and dry. She is not diaphoretic.    ASSESSMENT/ PLAN:  1. Dementia without behavior disturbance; is without change will continue aricept 10 mg daily and namenda xr 28 mg daily will not make changes will monitor   Her current weight is 168  pounds and is currently stable.   2. Tremor: she does not have a resting tremor present she does have a small amount of  lip tremor. Will continue cogentin 1 mg nightly   3. Dyslipidemia: will continue lipitor 20 mg daily fish oil 1 gm daily trig 246;  ldl is 61  4. Diabetes  hgb a1c 7.8  Will continue levemir  5 units;  will continue glipizide 5 mg twice daily  novolog  5 units with meals    5. Gerd: will continue protonix 40 mg daily   6. Constipation: will continue colace daily    7. Depression: will continue lexapro 20 mg nightly will monitor   8. Schizophrenia: is stable will lower to zyprexa 5  mg nightly and will monitor   Is followed by psych services.  Will continue cogentin 1 mg nightly for tardive dyskinesia and will monitor  .   9. Macular degeneration: will contine   zaditor to both eyes twice  daily   10. Allergic rhinitis: will continue flonase to both nares daily and zyrtec 10 mg daily   11. TIA: is presently stable; will continue asa 325 mg daily   12. Hypertension: will continue lisinopril 5 mg daily  Will check cbc; cmp; lipids; hgb a1c    MD is aware of resident's narcotic use and is in agreement with current plan of care. We will attempt to  wean resident as apropriate   Ok Edwards NP [facility]  Contact [phone] Monday through Friday 8am- 5pm  After hours call [phone]

## 2016-07-02 ENCOUNTER — Ambulatory Visit: Payer: Medicare Other | Admitting: Neurology

## 2016-07-02 DIAGNOSIS — S31819A Unspecified open wound of right buttock, initial encounter: Secondary | ICD-10-CM | POA: Diagnosis not present

## 2016-07-02 DIAGNOSIS — D649 Anemia, unspecified: Secondary | ICD-10-CM | POA: Diagnosis not present

## 2016-07-02 DIAGNOSIS — E785 Hyperlipidemia, unspecified: Secondary | ICD-10-CM | POA: Diagnosis not present

## 2016-07-02 DIAGNOSIS — E118 Type 2 diabetes mellitus with unspecified complications: Secondary | ICD-10-CM | POA: Diagnosis not present

## 2016-07-02 DIAGNOSIS — E559 Vitamin D deficiency, unspecified: Secondary | ICD-10-CM | POA: Diagnosis not present

## 2016-07-02 LAB — BASIC METABOLIC PANEL
BUN: 23 mg/dL — AB (ref 4–21)
BUN: 23 mg/dL — AB (ref 4–21)
CREATININE: 1.1 mg/dL (ref 0.5–1.1)
Creatinine: 1.1 mg/dL (ref 0.5–1.1)
Glucose: 83 mg/dL
Glucose: 83 mg/dL
POTASSIUM: 4.8 mmol/L (ref 3.4–5.3)
POTASSIUM: 4.8 mmol/L (ref 3.4–5.3)
SODIUM: 140 mmol/L (ref 137–147)
SODIUM: 140 mmol/L (ref 137–147)

## 2016-07-02 LAB — LIPID PANEL
CHOLESTEROL: 148 mg/dL (ref 0–200)
Cholesterol: 148 mg/dL (ref 0–200)
HDL: 40 mg/dL (ref 35–70)
HDL: 40 mg/dL (ref 35–70)
LDL CALC: 75 mg/dL
LDL CALC: 75 mg/dL
TRIGLYCERIDES: 165 mg/dL — AB (ref 40–160)
Triglycerides: 165 mg/dL — AB (ref 40–160)

## 2016-07-02 LAB — HEPATIC FUNCTION PANEL
ALK PHOS: 83 U/L (ref 25–125)
ALT: 15 U/L (ref 7–35)
AST: 18 U/L (ref 13–35)

## 2016-07-02 LAB — CBC AND DIFFERENTIAL
HCT: 38 % (ref 36–46)
HEMOGLOBIN: 12.5 g/dL (ref 12.0–16.0)
Neutrophils Absolute: 5 /uL
Platelets: 219 10*3/uL (ref 150–399)
WBC: 8.8 10*3/mL

## 2016-07-02 LAB — HEMOGLOBIN A1C: HEMOGLOBIN A1C: 7.4

## 2016-07-04 ENCOUNTER — Ambulatory Visit: Payer: Medicare Other | Admitting: Neurology

## 2016-07-10 ENCOUNTER — Encounter: Payer: Self-pay | Admitting: Neurology

## 2016-07-15 ENCOUNTER — Encounter: Payer: Self-pay | Admitting: Adult Health

## 2016-07-25 ENCOUNTER — Encounter: Payer: Self-pay | Admitting: Adult Health

## 2016-07-25 ENCOUNTER — Non-Acute Institutional Stay (SKILLED_NURSING_FACILITY): Payer: Medicare Other | Admitting: Adult Health

## 2016-07-25 DIAGNOSIS — I10 Essential (primary) hypertension: Secondary | ICD-10-CM

## 2016-07-25 DIAGNOSIS — K219 Gastro-esophageal reflux disease without esophagitis: Secondary | ICD-10-CM

## 2016-07-25 DIAGNOSIS — G458 Other transient cerebral ischemic attacks and related syndromes: Secondary | ICD-10-CM | POA: Diagnosis not present

## 2016-07-25 DIAGNOSIS — K5909 Other constipation: Secondary | ICD-10-CM

## 2016-07-25 DIAGNOSIS — E118 Type 2 diabetes mellitus with unspecified complications: Secondary | ICD-10-CM | POA: Diagnosis not present

## 2016-07-25 DIAGNOSIS — F0390 Unspecified dementia without behavioral disturbance: Secondary | ICD-10-CM

## 2016-07-25 DIAGNOSIS — E785 Hyperlipidemia, unspecified: Secondary | ICD-10-CM | POA: Diagnosis not present

## 2016-07-25 DIAGNOSIS — E1169 Type 2 diabetes mellitus with other specified complication: Secondary | ICD-10-CM | POA: Diagnosis not present

## 2016-07-25 DIAGNOSIS — F209 Schizophrenia, unspecified: Secondary | ICD-10-CM

## 2016-07-25 DIAGNOSIS — Z794 Long term (current) use of insulin: Secondary | ICD-10-CM

## 2016-07-25 DIAGNOSIS — E119 Type 2 diabetes mellitus without complications: Secondary | ICD-10-CM

## 2016-07-25 NOTE — Progress Notes (Signed)
Location:   [facility] Room Number: 225 B Place of Service:  SNF (31)   CODE STATUS: Full Code  Allergies  Allergen Reactions  . Ace Inhibitors   . Ether Nausea And Vomiting    Chief Complaint  Patient presents with  . Medical Management of Chronic Issues    1 month follow up    HPI:  She is long term resident of this facility being seen for the management of her chronic illnesses. Overall her status is stable. She tells me that she is feeling good. She does not have any complaints at this time. There are no nursing concerns at this time.    Past Medical History:  Diagnosis Date  . Allergic rhinitis   . CATARACT 06/06/2006   Qualifier: Diagnosis of  By: Genene Churn MD, Janett Billow    . Depressive disorder   . Diabetes mellitus type 2 in obese ([employer])   . Essential hypertension, benign 02/24/2015  . Fungal dermatitis   . Hyperlipidemia   . Hypertension   . Macular degeneration   . Mild cognitive impairment   . Narcissism   . Normocytic anemia   . Phobia   . Schizophrenia, paranoid ([place])   . Stress incontinence, female     Past Surgical History:  Procedure Laterality Date  . ABDOMINAL HYSTERECTOMY     due to fibroids  . CATARACT EXTRACTION  6 16 2006  . CRYOTHERAPY  2 15 2006   facial AK  . hyperplastic   4 20 2006   AK shave biopsy  . intraocular injection  [id number]  . TOTAL ABDOMINAL HYSTERECTOMY W/ BILATERAL SALPINGOOPHORECTOMY     due to endometriosis  non malignant    Social History   Social History  . Marital status: Divorced    Spouse name: N/A  . Number of children: N/A  . Years of education: N/A   Occupational History  . Not on file.   Social History Main Topics  . Smoking status: Former Research scientist (life sciences)  . Smokeless tobacco: Former Systems developer    Types: Chew  . Alcohol use No  . Drug use: No  . Sexual activity: Not on file   Other Topics Concern  . Not on file   Social History Narrative   Lives in [facility] (assisted living), remotely  smoked and chewed tobacco. Daughter([name])  drives her, independent of all ADL's   Family History  Problem Relation Age of Onset  . Heart disease Mother   . Heart disease Father   . Stroke Father   . Cancer Cousin     liver and colon  . Stroke Sister       VITAL SIGNS BP (!) 142/64   Pulse 62   Resp (!) 1   Ht 4\' 9"  (1.448 m)   Wt 168 lb 3.2 oz (76.3 kg)   BMI 36.40 kg/m   Patient's Medications  New Prescriptions   No medications on file  Previous Medications   ACETAMINOPHEN (TYLENOL) 325 MG TABLET    Take 650 mg by mouth every 6 (six) hours as needed.    AMINO ACIDS-PROTEIN HYDROLYS PO    Take 30 mLs by mouth 2 (two) times daily.   ASPIRIN EC 325 MG TABLET    Take 1 tablet (325 mg total) by mouth daily.   ATORVASTATIN (LIPITOR) 20 MG TABLET    Take 1 tablet (20 mg total) by mouth at bedtime.   BENZTROPINE (COGENTIN) 1 MG TABLET    Take 1 mg by  mouth at bedtime.   CALCIUM CARBONATE-VITAMIN D (CALCIUM 600-D) 600-400 MG-UNIT PER TABLET    Take 1 tablet by mouth every morning.    CETIRIZINE (ZYRTEC) 10 MG TABLET    Take 10 mg by mouth daily.   CHOLECALCIFEROL (VITAMIN D) 1000 UNITS TABLET    Take 1,000 Units by mouth daily.   CYANOCOBALAMIN 1000 MCG TABLET    Take 100 mcg by mouth daily.   DOCUSATE SODIUM (COLACE) 100 MG CAPSULE    Take 1 capsule (100 mg total) by mouth daily. For constipation   DONEPEZIL (ARICEPT) 10 MG TABLET    Take 10 mg by mouth at bedtime.   ESCITALOPRAM (LEXAPRO) 10 MG TABLET    Take 20 mg by mouth at bedtime. For depression   FLUTICASONE (FLONASE) 50 MCG/ACT NASAL SPRAY    Place 1 spray into both nostrils daily.   GLIPIZIDE (GLUCOTROL) 5 MG TABLET    Take 5 mg by mouth 2 (two) times daily.   GUAIFENESIN (MUCINEX) 600 MG 12 HR TABLET    Take 600 mg by mouth 2 (two) times daily as needed.   HYPROMELLOSE 0.4 % SOLN    Place 1 drop into both eyes 3 (three) times daily.   INSULIN ASPART (NOVOLOG) 100 UNIT/ML INJECTION    Inject 5 Units into the skin 3  (three) times daily before meals.   INSULIN DETEMIR (LEVEMIR FLEXPEN) 100 UNIT/ML PEN    Inject 7 Units into the skin at bedtime.    KETOTIFEN FUMARATE (ZADITOR OP)    Place 1 drop into both eyes 2 (two) times daily.   LOSARTAN (COZAAR) 25 MG TABLET    Take 25 mg by mouth daily.   MEMANTINE HCL ER (NAMENDA XR) 28 MG CP24    Take 28 mg by mouth daily. For dementia   MULTIPLE VITAMINS-MINERALS (DECUBI-VITE) CAPS    Take 1 capsule by mouth daily.   NUTRITIONAL SUPPLEMENTS (NUTRITIONAL SUPPLEMENT PO)    Take by mouth. HSG Puree texture, Nectar consistency   OLANZAPINE (ZYPREXA) 7.5 MG TABLET    Take 7.5 mg by mouth at bedtime.   OMEGA-3 FATTY ACIDS (FISH OIL) 1000 MG CAPS    Take 1,000 mg by mouth daily.   PANTOPRAZOLE (PROTONIX) 40 MG TABLET    Take 40 mg by mouth daily.   PROTEIN POWD    Take 2 scoop by mouth 2 (two) times daily.   TRIAMCINOLONE (KENALOG) 0.025 % OINTMENT    Apply 1 application topically daily. Apply to arms for skin integrity  Modified Medications   No medications on file  Discontinued Medications   No medications on file     SIGNIFICANT DIAGNOSTIC EXAMS  08-19-14: mammogram: scattered fibroglandular tissue   12-30-14: right eye intraocular injection   02-06-16: ct of head: Atrophy and small vessel disease, similar to priors. No acute intracranial findings.  02-06-16: mri of brain: Atrophy and small vessel disease, similar to priors. No acute stroke is evident.  02-06-16: ct angio of head and neck: CT HEADNo intracranial hemorrhage or CT evidence of large acute infarct. Chronic microvascular changes. No intracranial mass abnormal enhancement. Global atrophy without hydrocephalus.  CTA NECK Plaque right carotid bifurcation and proximal right internal carotid artery with less than 50% diameter narrowing. Mild plaque proximal left internal carotid artery with less than 50% diameter narrowing. Plaque with mild narrowing origin of vertebral arteries bilaterally. Left  vertebral artery slightly dominant size. CTA HEAD Plaque with mild narrowing cavernous segment internal carotid artery bilaterally. Anterior circulation  without medium or large size vessel significant stenosis or occlusion. Mild narrowing distal right vertebral artery. No significant stenosis left vertebral artery or basilar artery.    03-12-16: EEG: This is a mildly abnormal EEG due to the presence of  moderate generalized slowing which is indicative of bihemispheric dysfunction which is a nonspecific finding seen in a variety of degenerative, hypoxic, ischemic, toxic, metabolic etiologies. No definite epileptiform activity is noted   LABS REVIEWED:  07-12-15: wbc 8.2; hgb 12.7; hct 38.1; mcv 84.2; plt 229; glucose 94; bun 13.0; creat 0.72; k+ 4.4; na++ 141; liver normal albumin 3.4; hgb a1c 6.4; chol 135; ldl 64; trig 130; hdl 45  12-09-15: glucose 256; bun 14.6; creat 1.01; k+ 4.5; na++ 137; liver normal albumin 3.5; chol 156; ldl 61; trig 246; hdl 45  hgb a1c 7.6  02-06-16: wbc 5.6; hgb 13.0; hct 40.3; mcv 86.1; plt 149; glucose 239; bun 8; creat 0.84; k+ 4.0; na++ 132; liver normal albumin 3.1; blood culture: no growth; UA: neg 02-15-16: pre-albumin 10  03-14-16: urine micro-albumin <1.2  03-27-16: glucose 126; bun 11.7; creat 0.98; k+ 4.5; na++ 143; hgb a1c 7.8     Review of Systems Constitutional: Negative for malaise/fatigue.  Respiratory: Negative for cough and shortness of breath.   Cardiovascular: Negative for chest pain, palpitations and leg swelling.  Gastrointestinal: Negative for heartburn, abdominal pain and constipation.  Musculoskeletal: Negative for myalgias and joint pain.  Skin: Negative.   Neurological: Negative for headaches.  Psychiatric/Behavioral: The patient is not nervous/anxious.       Physical Exam Constitutional: No distress.  Overweight   Neck: Neck supple. No JVD present. No thyromegaly present.  Cardiovascular: Normal rate, regular rhythm and intact  distal pulses.   Respiratory: Effort normal and breath sounds normal. No respiratory distress.  GI: Soft. Bowel sounds are normal. She exhibits no distension. There is no tenderness.  Musculoskeletal: She exhibits no edema.  Is able to move all extremities Has very little abnormal tongue movement  Neurological: She is alert.  Skin: Skin is warm and dry. She is not diaphoretic.    ASSESSMENT/ PLAN:  1. Dementia without behavior disturbance; is without change will continue aricept 10 mg daily and namenda xr 28 mg daily will not make changes will monitor   Her current weight is 168  pounds and is currently stable.   2. Tremor: she does not have a resting tremor present she does have a small amount of  lip tremor. Will continue cogentin 1 mg nightly   3. Dyslipidemia: will continue lipitor 20 mg daily fish oil 1 gm daily trig 246;  ldl is 61  4. Diabetes  hgb a1c 7.8  Will continue levemir  5 units;  will continue glipizide 5 mg twice daily  novolog  5 units with meals    5. Gerd: will continue protonix 40 mg daily   6. Constipation: will continue colace daily    7. Depression: will continue lexapro 20 mg nightly will monitor   8. Schizophrenia: is stable continue  zyprexa 7.5  mg nightly and will monitor   Is followed by psych services.  Will continue cogentin 1 mg nightly for tardive dyskinesia and will monitor  .   9. Macular degeneration: will contine   zaditor to both eyes twice daily   10. Allergic rhinitis: will continue flonase to both nares daily and zyrtec 10 mg daily   11. TIA: is presently stable; will continue asa 325 mg daily   12. Hypertension:  will continue lisinopril 5 mg daily  Will check cbc; cmp; lipids; hgb a1c    MD is aware of resident's narcotic use and is in agreement with current plan of care. We will attempt to wean resident as apropriate   Ok Edwards NP [facility]  Contact [phone] Monday through Friday 8am- 5pm  After hours call  [phone]

## 2016-07-26 DIAGNOSIS — E118 Type 2 diabetes mellitus with unspecified complications: Secondary | ICD-10-CM | POA: Diagnosis not present

## 2016-08-09 DIAGNOSIS — R1311 Dysphagia, oral phase: Secondary | ICD-10-CM | POA: Diagnosis not present

## 2016-08-09 DIAGNOSIS — F259 Schizoaffective disorder, unspecified: Secondary | ICD-10-CM | POA: Diagnosis not present

## 2016-08-09 DIAGNOSIS — F0391 Unspecified dementia with behavioral disturbance: Secondary | ICD-10-CM | POA: Diagnosis not present

## 2016-08-09 DIAGNOSIS — F329 Major depressive disorder, single episode, unspecified: Secondary | ICD-10-CM | POA: Diagnosis not present

## 2016-08-10 DIAGNOSIS — R1311 Dysphagia, oral phase: Secondary | ICD-10-CM | POA: Diagnosis not present

## 2016-08-10 DIAGNOSIS — F0391 Unspecified dementia with behavioral disturbance: Secondary | ICD-10-CM | POA: Diagnosis not present

## 2016-08-13 DIAGNOSIS — F0391 Unspecified dementia with behavioral disturbance: Secondary | ICD-10-CM | POA: Diagnosis not present

## 2016-08-13 DIAGNOSIS — R1311 Dysphagia, oral phase: Secondary | ICD-10-CM | POA: Diagnosis not present

## 2016-08-14 DIAGNOSIS — F0391 Unspecified dementia with behavioral disturbance: Secondary | ICD-10-CM | POA: Diagnosis not present

## 2016-08-14 DIAGNOSIS — R1311 Dysphagia, oral phase: Secondary | ICD-10-CM | POA: Diagnosis not present

## 2016-08-15 DIAGNOSIS — F0391 Unspecified dementia with behavioral disturbance: Secondary | ICD-10-CM | POA: Diagnosis not present

## 2016-08-15 DIAGNOSIS — R1311 Dysphagia, oral phase: Secondary | ICD-10-CM | POA: Diagnosis not present

## 2016-08-16 DIAGNOSIS — F0391 Unspecified dementia with behavioral disturbance: Secondary | ICD-10-CM | POA: Diagnosis not present

## 2016-08-16 DIAGNOSIS — R1311 Dysphagia, oral phase: Secondary | ICD-10-CM | POA: Diagnosis not present

## 2016-08-17 DIAGNOSIS — F0391 Unspecified dementia with behavioral disturbance: Secondary | ICD-10-CM | POA: Diagnosis not present

## 2016-08-17 DIAGNOSIS — R1311 Dysphagia, oral phase: Secondary | ICD-10-CM | POA: Diagnosis not present

## 2016-08-20 DIAGNOSIS — R1311 Dysphagia, oral phase: Secondary | ICD-10-CM | POA: Diagnosis not present

## 2016-08-20 DIAGNOSIS — F0391 Unspecified dementia with behavioral disturbance: Secondary | ICD-10-CM | POA: Diagnosis not present

## 2016-08-21 DIAGNOSIS — R1311 Dysphagia, oral phase: Secondary | ICD-10-CM | POA: Diagnosis not present

## 2016-08-21 DIAGNOSIS — F0391 Unspecified dementia with behavioral disturbance: Secondary | ICD-10-CM | POA: Diagnosis not present

## 2016-08-22 DIAGNOSIS — F0391 Unspecified dementia with behavioral disturbance: Secondary | ICD-10-CM | POA: Diagnosis not present

## 2016-08-22 DIAGNOSIS — R1311 Dysphagia, oral phase: Secondary | ICD-10-CM | POA: Diagnosis not present

## 2016-08-23 DIAGNOSIS — F0391 Unspecified dementia with behavioral disturbance: Secondary | ICD-10-CM | POA: Diagnosis not present

## 2016-08-23 DIAGNOSIS — R1311 Dysphagia, oral phase: Secondary | ICD-10-CM | POA: Diagnosis not present

## 2016-08-24 DIAGNOSIS — R1311 Dysphagia, oral phase: Secondary | ICD-10-CM | POA: Diagnosis not present

## 2016-08-24 DIAGNOSIS — F0391 Unspecified dementia with behavioral disturbance: Secondary | ICD-10-CM | POA: Diagnosis not present

## 2016-08-27 DIAGNOSIS — R1311 Dysphagia, oral phase: Secondary | ICD-10-CM | POA: Diagnosis not present

## 2016-08-27 DIAGNOSIS — F0391 Unspecified dementia with behavioral disturbance: Secondary | ICD-10-CM | POA: Diagnosis not present

## 2016-08-28 ENCOUNTER — Non-Acute Institutional Stay (SKILLED_NURSING_FACILITY): Payer: Medicare Other | Admitting: Adult Health

## 2016-08-28 ENCOUNTER — Encounter: Payer: Self-pay | Admitting: Adult Health

## 2016-08-28 DIAGNOSIS — G458 Other transient cerebral ischemic attacks and related syndromes: Secondary | ICD-10-CM | POA: Diagnosis not present

## 2016-08-28 DIAGNOSIS — E119 Type 2 diabetes mellitus without complications: Secondary | ICD-10-CM

## 2016-08-28 DIAGNOSIS — E1169 Type 2 diabetes mellitus with other specified complication: Secondary | ICD-10-CM

## 2016-08-28 DIAGNOSIS — F0391 Unspecified dementia with behavioral disturbance: Secondary | ICD-10-CM | POA: Diagnosis not present

## 2016-08-28 DIAGNOSIS — K219 Gastro-esophageal reflux disease without esophagitis: Secondary | ICD-10-CM

## 2016-08-28 DIAGNOSIS — E785 Hyperlipidemia, unspecified: Secondary | ICD-10-CM | POA: Diagnosis not present

## 2016-08-28 DIAGNOSIS — I10 Essential (primary) hypertension: Secondary | ICD-10-CM | POA: Diagnosis not present

## 2016-08-28 DIAGNOSIS — Z794 Long term (current) use of insulin: Secondary | ICD-10-CM | POA: Diagnosis not present

## 2016-08-28 DIAGNOSIS — G2401 Drug induced subacute dyskinesia: Secondary | ICD-10-CM

## 2016-08-28 DIAGNOSIS — F209 Schizophrenia, unspecified: Secondary | ICD-10-CM | POA: Diagnosis not present

## 2016-08-28 DIAGNOSIS — K5909 Other constipation: Secondary | ICD-10-CM | POA: Diagnosis not present

## 2016-08-28 DIAGNOSIS — R1311 Dysphagia, oral phase: Secondary | ICD-10-CM | POA: Diagnosis not present

## 2016-08-28 NOTE — Progress Notes (Addendum)
Location:   [facility] Room Number: 225 B Place of Service:  SNF (31)   CODE STATUS: Full Code  Allergies  Allergen Reactions  . Ace Inhibitors   . Ether Nausea And Vomiting    Chief Complaint  Patient presents with  . Medical Management of Chronic Issues    1 month follow up    HPI:  She is a long term resident of this facility being seen for the management of her chronic illnesses. Overall there is little change in her status. She tells me that she is feeling good and has no concerns. There are no nursing concerns at this time.    Past Medical History:  Diagnosis Date  . Allergic rhinitis   . CATARACT 06/06/2006   Qualifier: Diagnosis of  By: Genene Churn MD, Janett Billow    . Depressive disorder   . Diabetes mellitus type 2 in obese ([place])   . Essential hypertension, benign 02/24/2015  . Fungal dermatitis   . Hyperlipidemia   . Hypertension   . Macular degeneration   . Mild cognitive impairment   . Narcissism   . Normocytic anemia   . Phobia   . Schizophrenia, paranoid ([place])   . Stress incontinence, female     Past Surgical History:  Procedure Laterality Date  . ABDOMINAL HYSTERECTOMY     due to fibroids  . CATARACT EXTRACTION  6 16 2006  . CRYOTHERAPY  2 15 2006   facial AK  . hyperplastic   4 20 2006   AK shave biopsy  . intraocular injection  [id number]  . TOTAL ABDOMINAL HYSTERECTOMY W/ BILATERAL SALPINGOOPHORECTOMY     due to endometriosis  non malignant    Social History   Social History  . Marital status: Divorced    Spouse name: N/A  . Number of children: N/A  . Years of education: N/A   Occupational History  . Not on file.   Social History Main Topics  . Smoking status: Former Research scientist (life sciences)  . Smokeless tobacco: Former Systems developer    Types: Chew  . Alcohol use No  . Drug use: No  . Sexual activity: Not on file   Other Topics Concern  . Not on file   Social History Narrative   Lives in [facility] (assisted living), remotely smoked and  chewed tobacco. Daughter([name])  drives her, independent of all ADL's   Family History  Problem Relation Age of Onset  . Heart disease Mother   . Heart disease Father   . Stroke Father   . Cancer Cousin        liver and colon  . Stroke Sister       VITAL SIGNS BP (!) 144/85   Pulse (!) 57   Temp 98.2 F (36.8 C)   Resp 18   Ht 4\' 9"  (1.448 m)   Wt 174 lb (78.9 kg)   SpO2 99%   BMI 37.65 kg/m   Patient's Medications  New Prescriptions   No medications on file  Previous Medications   ACETAMINOPHEN (TYLENOL) 325 MG TABLET    Take 650 mg by mouth every 6 (six) hours as needed.    AMINO ACIDS-PROTEIN HYDROLYS PO    Take 30 mLs by mouth 2 (two) times daily.   ASPIRIN EC 325 MG TABLET    Take 1 tablet (325 mg total) by mouth daily.   ATORVASTATIN (LIPITOR) 20 MG TABLET    Take 1 tablet (20 mg total) by mouth at bedtime.   BENZTROPINE (  COGENTIN) 1 MG TABLET    Take 1 mg by mouth at bedtime.   CALCIUM CARBONATE-VITAMIN D (CALCIUM 600-D) 600-400 MG-UNIT PER TABLET    Take 1 tablet by mouth at bedtime.    CETIRIZINE (ZYRTEC) 10 MG TABLET    Take 5 mg by mouth at bedtime.    CHOLECALCIFEROL (VITAMIN D) 1000 UNITS TABLET    Take 1,000 Units by mouth daily.   CYANOCOBALAMIN 1000 MCG TABLET    Take 1,000 mcg by mouth daily.    DEXTROMETHORPHAN-GUAIFENESIN (MUCINEX DM) 30-600 MG 12HR TABLET    Take 1 tablet by mouth 2 (two) times daily.   DOCUSATE SODIUM (COLACE) 100 MG CAPSULE    Take 1 capsule (100 mg total) by mouth daily. For constipation   DONEPEZIL (ARICEPT) 10 MG TABLET    Take 10 mg by mouth at bedtime.   ESCITALOPRAM (LEXAPRO) 20 MG TABLET    Take 20 mg by mouth daily.   FLUTICASONE (FLONASE) 50 MCG/ACT NASAL SPRAY    Place 1 spray into both nostrils daily.   GLIPIZIDE (GLUCOTROL) 5 MG TABLET    Take 5 mg by mouth 2 (two) times daily.   HYPROMELLOSE 0.4 % SOLN    Place 1 drop into both eyes 3 (three) times daily.   INSULIN ASPART (NOVOLOG) 100 UNIT/ML INJECTION    Inject 5  Units into the skin 3 (three) times daily before meals.   INSULIN DETEMIR (LEVEMIR FLEXPEN) 100 UNIT/ML PEN    Inject 7 Units into the skin at bedtime.    KETOTIFEN FUMARATE (ZADITOR OP)    Place 1 drop into both eyes 2 (two) times daily.   LOSARTAN (COZAAR) 25 MG TABLET    Take 25 mg by mouth daily.   MEMANTINE HCL ER (NAMENDA XR) 28 MG CP24    Take 28 mg by mouth daily. For dementia   MULTIPLE VITAMINS-MINERALS (DECUBI-VITE) CAPS    Take 1 capsule by mouth daily.   NUTRITIONAL SUPPLEMENTS (NUTRITIONAL SUPPLEMENT PO)    Take by mouth. HSG Puree texture, Nectar consistency   OLANZAPINE (ZYPREXA) 5 MG TABLET    Take 5 mg by mouth at bedtime.   OMEGA-3 FATTY ACIDS (FISH OIL) 1000 MG CAPS    Take 1,000 mg by mouth daily.   PANTOPRAZOLE (PROTONIX) 40 MG TABLET    Take 40 mg by mouth daily.   PROTEIN POWD    Take 2 scoop by mouth 2 (two) times daily. Mix in Nectar thick water   TRIAMCINOLONE (KENALOG) 0.025 % OINTMENT    Apply 1 application topically daily. Apply to arms for skin integrity  Modified Medications   No medications on file  Discontinued Medications   ESCITALOPRAM (LEXAPRO) 10 MG TABLET    Take 20 mg by mouth at bedtime. For depression   GUAIFENESIN (MUCINEX) 600 MG 12 HR TABLET    Take 600 mg by mouth 2 (two) times daily as needed.   OLANZAPINE (ZYPREXA) 7.5 MG TABLET    Take 7.5 mg by mouth at bedtime.     SIGNIFICANT DIAGNOSTIC EXAMS  08-19-14: mammogram: scattered fibroglandular tissue   12-30-14: right eye intraocular injection   02-06-16: ct of head: Atrophy and small vessel disease, similar to priors. No acute intracranial findings.  02-06-16: mri of brain: Atrophy and small vessel disease, similar to priors. No acute stroke is evident.  02-06-16: ct angio of head and neck: CT HEADNo intracranial hemorrhage or CT evidence of large acute infarct. Chronic microvascular changes. No intracranial mass abnormal  enhancement. Global atrophy without hydrocephalus.  CTA  NECK Plaque right carotid bifurcation and proximal right internal carotid artery with less than 50% diameter narrowing. Mild plaque proximal left internal carotid artery with less than 50% diameter narrowing. Plaque with mild narrowing origin of vertebral arteries bilaterally. Left vertebral artery slightly dominant size. CTA HEAD Plaque with mild narrowing cavernous segment internal carotid artery bilaterally. Anterior circulation without medium or large size vessel significant stenosis or occlusion. Mild narrowing distal right vertebral artery. No significant stenosis left vertebral artery or basilar artery.    03-12-16: EEG: This is a mildly abnormal EEG due to the presence of  moderate generalized slowing which is indicative of bihemispheric dysfunction which is a nonspecific finding seen in a variety of degenerative, hypoxic, ischemic, toxic, metabolic etiologies. No definite epileptiform activity is noted   LABS REVIEWED:  12-09-15: glucose 256; bun 14.6; creat 1.01; k+ 4.5; na++ 137; liver normal albumin 3.5; chol 156; ldl 61; trig 246; hdl 45  hgb a1c 7.6  02-06-16: wbc 5.6; hgb 13.0; hct 40.3; mcv 86.1; plt 149; glucose 239; bun 8; creat 0.84; k+ 4.0; na++ 132; liver normal albumin 3.1; blood culture: no growth; UA: neg 02-15-16: pre-albumin 10  03-14-16: urine micro-albumin <1.2  03-27-16: glucose 126; bun 11.7; creat 0.98; k+ 4.5; na++ 143; hgb a1c 7.8  07-02-16: wbc 8.8; hgb 12.5; hct 37.9 ;mcv 85.6; plt 219; glucose 83; bun 22.5; creat 1.12; k+ 4.8; na++ 140; ca 0.1; liver  Normal albumin 3.4  hgb a1c 7.4 chol 148; ldl 75; trig 165; hdl 40  07-25-16: wbc 7.8; hgb 13.0; hct 39.9; mcv 84.9;  glucose 203; bun 15.5; creat 0.73; k+ 4.4 na++ 138; ca 9.1; hgb a1c 7.2  07-26-16: chol 140; ldl 92; trig 142; hdl 43    Review of Systems Constitutional: Negative for malaise/fatigue.  Respiratory: Negative for cough and shortness of breath.   Cardiovascular: Negative for chest pain, palpitations  and leg swelling.  Gastrointestinal: Negative for heartburn, abdominal pain and constipation.  Musculoskeletal: Negative for myalgias and joint pain.  Skin: Negative.   Neurological: Negative for headaches.  Psychiatric/Behavioral: The patient is not nervous/anxious.       Physical Exam Constitutional: No distress.  Overweight   Neck: Neck supple. No JVD present. No thyromegaly present.  Cardiovascular: Normal rate, regular rhythm and intact distal pulses.   Respiratory: Effort normal and breath sounds normal. No respiratory distress.  GI: Soft. Bowel sounds are normal. She exhibits no distension. There is no tenderness.  Musculoskeletal: She exhibits no edema.  Is able to move all extremities Has very little abnormal tongue movement  Neurological: She is alert.  Skin: Skin is warm and dry. She is not diaphoretic.    ASSESSMENT/ PLAN:  1. Dementia without behavior disturbance; is without change will continue aricept 10 mg daily and namenda xr 28 mg daily will not make changes will monitor   Her current weight is 174  pounds and is currently stable.   2. Tremor: she does not have a resting tremor present she does have a small amount of  lip tremor. Will continue cogentin 1 mg nightly   3. Dyslipidemia:ldl 92; trig 142  will continue lipitor 20 mg daily fish oil 1 gm daily  4. Diabetes  hgb a1c 7.2 (previous 7.8)  Will continue levemir  7 units;  will continue glipizide 5 mg twice daily  novolog  5 units with meals   5. Gerd: will continue protonix 40 mg daily   6.  Constipation: will continue colace daily    7. Depression: will continue lexapro 20 mg nightly will monitor   8. Schizophrenia: is stable continue  zyprexa 5  mg nightly and will monitor   Is followed by psych services.  Will continue cogentin 1 mg nightly for tardive dyskinesia and will monitor  .   9. Macular degeneration: will contine   zaditor to both eyes twice daily   10. Allergic rhinitis: will continue  flonase to both nares daily and zyrtec 10 mg daily   11. TIA: is presently stable; will continue asa 325 mg daily   12. Hypertension: will continue lisinopril 5 mg daily    Ok Edwards NP [facility]  Contact [phone] Monday through Friday 8am- 5pm  After hours call [phone]

## 2016-08-29 DIAGNOSIS — F0391 Unspecified dementia with behavioral disturbance: Secondary | ICD-10-CM | POA: Diagnosis not present

## 2016-08-29 DIAGNOSIS — R1311 Dysphagia, oral phase: Secondary | ICD-10-CM | POA: Diagnosis not present

## 2016-08-30 DIAGNOSIS — F259 Schizoaffective disorder, unspecified: Secondary | ICD-10-CM | POA: Diagnosis not present

## 2016-08-30 DIAGNOSIS — R1311 Dysphagia, oral phase: Secondary | ICD-10-CM | POA: Diagnosis not present

## 2016-08-30 DIAGNOSIS — F329 Major depressive disorder, single episode, unspecified: Secondary | ICD-10-CM | POA: Diagnosis not present

## 2016-08-30 DIAGNOSIS — F0391 Unspecified dementia with behavioral disturbance: Secondary | ICD-10-CM | POA: Diagnosis not present

## 2016-08-31 DIAGNOSIS — F0391 Unspecified dementia with behavioral disturbance: Secondary | ICD-10-CM | POA: Diagnosis not present

## 2016-08-31 DIAGNOSIS — R1311 Dysphagia, oral phase: Secondary | ICD-10-CM | POA: Diagnosis not present

## 2016-09-03 DIAGNOSIS — R1311 Dysphagia, oral phase: Secondary | ICD-10-CM | POA: Diagnosis not present

## 2016-09-03 DIAGNOSIS — F0391 Unspecified dementia with behavioral disturbance: Secondary | ICD-10-CM | POA: Diagnosis not present

## 2016-09-04 DIAGNOSIS — R1311 Dysphagia, oral phase: Secondary | ICD-10-CM | POA: Diagnosis not present

## 2016-09-04 DIAGNOSIS — F0391 Unspecified dementia with behavioral disturbance: Secondary | ICD-10-CM | POA: Diagnosis not present

## 2016-09-05 DIAGNOSIS — F0391 Unspecified dementia with behavioral disturbance: Secondary | ICD-10-CM | POA: Diagnosis not present

## 2016-09-05 DIAGNOSIS — R1311 Dysphagia, oral phase: Secondary | ICD-10-CM | POA: Diagnosis not present

## 2016-09-06 DIAGNOSIS — F0391 Unspecified dementia with behavioral disturbance: Secondary | ICD-10-CM | POA: Diagnosis not present

## 2016-09-06 DIAGNOSIS — R1311 Dysphagia, oral phase: Secondary | ICD-10-CM | POA: Diagnosis not present

## 2016-09-07 DIAGNOSIS — F0391 Unspecified dementia with behavioral disturbance: Secondary | ICD-10-CM | POA: Diagnosis not present

## 2016-09-07 DIAGNOSIS — R1311 Dysphagia, oral phase: Secondary | ICD-10-CM | POA: Diagnosis not present

## 2016-09-08 DIAGNOSIS — R1311 Dysphagia, oral phase: Secondary | ICD-10-CM | POA: Diagnosis not present

## 2016-09-08 DIAGNOSIS — F0391 Unspecified dementia with behavioral disturbance: Secondary | ICD-10-CM | POA: Diagnosis not present

## 2016-09-10 DIAGNOSIS — R1311 Dysphagia, oral phase: Secondary | ICD-10-CM | POA: Diagnosis not present

## 2016-09-10 DIAGNOSIS — F0391 Unspecified dementia with behavioral disturbance: Secondary | ICD-10-CM | POA: Diagnosis not present

## 2016-09-11 DIAGNOSIS — F0391 Unspecified dementia with behavioral disturbance: Secondary | ICD-10-CM | POA: Diagnosis not present

## 2016-09-11 DIAGNOSIS — R1311 Dysphagia, oral phase: Secondary | ICD-10-CM | POA: Diagnosis not present

## 2016-09-12 DIAGNOSIS — F0391 Unspecified dementia with behavioral disturbance: Secondary | ICD-10-CM | POA: Diagnosis not present

## 2016-09-12 DIAGNOSIS — R1311 Dysphagia, oral phase: Secondary | ICD-10-CM | POA: Diagnosis not present

## 2016-09-13 DIAGNOSIS — F0391 Unspecified dementia with behavioral disturbance: Secondary | ICD-10-CM | POA: Diagnosis not present

## 2016-09-13 DIAGNOSIS — R1311 Dysphagia, oral phase: Secondary | ICD-10-CM | POA: Diagnosis not present

## 2016-09-14 DIAGNOSIS — R1311 Dysphagia, oral phase: Secondary | ICD-10-CM | POA: Diagnosis not present

## 2016-09-14 DIAGNOSIS — F0391 Unspecified dementia with behavioral disturbance: Secondary | ICD-10-CM | POA: Diagnosis not present

## 2016-09-17 DIAGNOSIS — R1311 Dysphagia, oral phase: Secondary | ICD-10-CM | POA: Diagnosis not present

## 2016-09-17 DIAGNOSIS — F0391 Unspecified dementia with behavioral disturbance: Secondary | ICD-10-CM | POA: Diagnosis not present

## 2016-09-18 DIAGNOSIS — R1311 Dysphagia, oral phase: Secondary | ICD-10-CM | POA: Diagnosis not present

## 2016-09-18 DIAGNOSIS — F0391 Unspecified dementia with behavioral disturbance: Secondary | ICD-10-CM | POA: Diagnosis not present

## 2016-09-19 DIAGNOSIS — F0391 Unspecified dementia with behavioral disturbance: Secondary | ICD-10-CM | POA: Diagnosis not present

## 2016-09-19 DIAGNOSIS — R1311 Dysphagia, oral phase: Secondary | ICD-10-CM | POA: Diagnosis not present

## 2016-09-20 ENCOUNTER — Encounter (INDEPENDENT_AMBULATORY_CARE_PROVIDER_SITE_OTHER): Payer: Medicare Other | Admitting: Ophthalmology

## 2016-09-20 DIAGNOSIS — R1311 Dysphagia, oral phase: Secondary | ICD-10-CM | POA: Diagnosis not present

## 2016-09-20 DIAGNOSIS — F0391 Unspecified dementia with behavioral disturbance: Secondary | ICD-10-CM | POA: Diagnosis not present

## 2016-09-21 DIAGNOSIS — F0391 Unspecified dementia with behavioral disturbance: Secondary | ICD-10-CM | POA: Diagnosis not present

## 2016-09-21 DIAGNOSIS — R1311 Dysphagia, oral phase: Secondary | ICD-10-CM | POA: Diagnosis not present

## 2016-09-24 DIAGNOSIS — R1311 Dysphagia, oral phase: Secondary | ICD-10-CM | POA: Diagnosis not present

## 2016-09-24 DIAGNOSIS — F0391 Unspecified dementia with behavioral disturbance: Secondary | ICD-10-CM | POA: Diagnosis not present

## 2016-09-25 DIAGNOSIS — F0391 Unspecified dementia with behavioral disturbance: Secondary | ICD-10-CM | POA: Diagnosis not present

## 2016-09-25 DIAGNOSIS — R1311 Dysphagia, oral phase: Secondary | ICD-10-CM | POA: Diagnosis not present

## 2016-09-26 DIAGNOSIS — R1311 Dysphagia, oral phase: Secondary | ICD-10-CM | POA: Diagnosis not present

## 2016-09-26 DIAGNOSIS — F0391 Unspecified dementia with behavioral disturbance: Secondary | ICD-10-CM | POA: Diagnosis not present

## 2016-09-27 DIAGNOSIS — F0391 Unspecified dementia with behavioral disturbance: Secondary | ICD-10-CM | POA: Diagnosis not present

## 2016-09-27 DIAGNOSIS — R1311 Dysphagia, oral phase: Secondary | ICD-10-CM | POA: Diagnosis not present

## 2016-09-28 DIAGNOSIS — R1311 Dysphagia, oral phase: Secondary | ICD-10-CM | POA: Diagnosis not present

## 2016-09-28 DIAGNOSIS — F0391 Unspecified dementia with behavioral disturbance: Secondary | ICD-10-CM | POA: Diagnosis not present

## 2016-10-01 DIAGNOSIS — R1311 Dysphagia, oral phase: Secondary | ICD-10-CM | POA: Diagnosis not present

## 2016-10-01 DIAGNOSIS — F0391 Unspecified dementia with behavioral disturbance: Secondary | ICD-10-CM | POA: Diagnosis not present

## 2016-10-02 DIAGNOSIS — R1311 Dysphagia, oral phase: Secondary | ICD-10-CM | POA: Diagnosis not present

## 2016-10-02 DIAGNOSIS — F0391 Unspecified dementia with behavioral disturbance: Secondary | ICD-10-CM | POA: Diagnosis not present

## 2016-10-03 DIAGNOSIS — R1311 Dysphagia, oral phase: Secondary | ICD-10-CM | POA: Diagnosis not present

## 2016-10-03 DIAGNOSIS — F0391 Unspecified dementia with behavioral disturbance: Secondary | ICD-10-CM | POA: Diagnosis not present

## 2016-10-04 DIAGNOSIS — F0391 Unspecified dementia with behavioral disturbance: Secondary | ICD-10-CM | POA: Diagnosis not present

## 2016-10-04 DIAGNOSIS — R1311 Dysphagia, oral phase: Secondary | ICD-10-CM | POA: Diagnosis not present

## 2016-10-05 ENCOUNTER — Non-Acute Institutional Stay (SKILLED_NURSING_FACILITY): Payer: Medicare Other | Admitting: Adult Health

## 2016-10-05 ENCOUNTER — Encounter: Payer: Self-pay | Admitting: Adult Health

## 2016-10-05 DIAGNOSIS — F209 Schizophrenia, unspecified: Secondary | ICD-10-CM | POA: Diagnosis not present

## 2016-10-05 DIAGNOSIS — E785 Hyperlipidemia, unspecified: Secondary | ICD-10-CM

## 2016-10-05 DIAGNOSIS — R1311 Dysphagia, oral phase: Secondary | ICD-10-CM | POA: Diagnosis not present

## 2016-10-05 DIAGNOSIS — F0391 Unspecified dementia with behavioral disturbance: Secondary | ICD-10-CM | POA: Diagnosis not present

## 2016-10-05 DIAGNOSIS — G458 Other transient cerebral ischemic attacks and related syndromes: Secondary | ICD-10-CM

## 2016-10-05 DIAGNOSIS — I10 Essential (primary) hypertension: Secondary | ICD-10-CM

## 2016-10-05 DIAGNOSIS — F0390 Unspecified dementia without behavioral disturbance: Secondary | ICD-10-CM

## 2016-10-05 DIAGNOSIS — E1169 Type 2 diabetes mellitus with other specified complication: Secondary | ICD-10-CM | POA: Diagnosis not present

## 2016-10-05 DIAGNOSIS — Z794 Long term (current) use of insulin: Secondary | ICD-10-CM | POA: Diagnosis not present

## 2016-10-05 DIAGNOSIS — E119 Type 2 diabetes mellitus without complications: Secondary | ICD-10-CM

## 2016-10-05 DIAGNOSIS — J301 Allergic rhinitis due to pollen: Secondary | ICD-10-CM | POA: Diagnosis not present

## 2016-10-05 NOTE — Progress Notes (Signed)
Provider:  Ok Edwards, NP Location:  [facility] Room Number: [place] of Service:  SNF (31)   PCP: Hennie Duos, MD Patient Care Team: Hennie Duos, MD as PCP - General (Internal Medicine) Hayden Pedro, MD as Attending Physician (Ophthalmology) Nyoka Cowden Phylis Bougie, NP as Nurse Practitioner (Nurse Practitioner) [facility], [place] ([place])  Extended Emergency Contact Information Primary Emergency Contact: Heckmann,[name] Address: [address]          [place], [place] [address] [name] of [place] Phone: [phone] Relation: Daughter  Code Status: Full Code Goals of Care: Advanced Directive information Advanced Directives 10/05/2016  Does Patient Have a Medical Advance Directive? Yes  Type of Advance Directive Out of facility DNR (pink MOST or yellow form)  Does patient want to make changes to medical advance directive? No - Patient declined  Copy of [place] in Chart? -  Would patient like information on creating a medical advance directive? -  Pre-existing out of facility DNR order (yellow form or pink MOST form) Pink MOST form placed in chart (order not valid for inpatient use)      Allergies  Allergen Reactions  . Ace Inhibitors   . Ether Nausea And Vomiting     Chief Complaint  Patient presents with  . Annual Exam    Yearly exam    HPI: Patient is a 83 y.o. female seen today for an annual comprehensive examination. She has not been hospitalized over the past year. She continues to get out of bed daily and does participate with activities. She tells me that she is feeling good. There are no nursing concerns at this time.    Past Medical History:  Diagnosis Date  . Allergic rhinitis   . CATARACT 06/06/2006   Qualifier: Diagnosis of  By: Genene Churn MD, Janett Billow    . Depressive disorder   . Diabetes mellitus type 2 in obese ([place])   . Essential hypertension, benign 02/24/2015  . Fungal  dermatitis   . Hyperlipidemia   . Hypertension   . Macular degeneration   . Mild cognitive impairment   . Narcissism   . Normocytic anemia   . Phobia   . Schizophrenia, paranoid ([place])   . Stress incontinence, female    Past Surgical History:  Procedure Laterality Date  . ABDOMINAL HYSTERECTOMY     due to fibroids  . CATARACT EXTRACTION  6 16 2006  . CRYOTHERAPY  2 15 2006   facial AK  . hyperplastic   4 20 2006   AK shave biopsy  . intraocular injection  [id number]  . TOTAL ABDOMINAL HYSTERECTOMY W/ BILATERAL SALPINGOOPHORECTOMY     due to endometriosis  non malignant    reports that she has quit smoking. She has quit using smokeless tobacco. Her smokeless tobacco use included Chew. She reports that she does not drink alcohol or use drugs. Social History   Social History  . Marital status: Divorced    Spouse name: N/A  . Number of children: N/A  . Years of education: N/A   Occupational History  . Not on file.   Social History Main Topics  . Smoking status: Former Research scientist (life sciences)  . Smokeless tobacco: Former Systems developer    Types: Chew  . Alcohol use No  . Drug use: No  . Sexual activity: Not on file   Other Topics Concern  . Not on file   Social History Narrative   Lives in [facility] (assisted  living), remotely smoked and chewed tobacco. Daughter([name])  drives her, independent of all ADL's   Family History  Problem Relation Age of Onset  . Heart disease Mother   . Heart disease Father   . Stroke Father   . Cancer Cousin        liver and colon  . Stroke Sister     Vitals:   10/05/16 1154  BP: 108/64  Pulse: 76  Resp: 16  Temp: 97.9 F (36.6 C)  SpO2: 98%  Weight: 177 lb 9.6 oz (80.6 kg)  Height: 4\' 9"  (1.448 m)   Body mass index is 38.43 kg/m.  Outpatient Encounter Prescriptions as of 10/05/2016  Medication Sig  . acetaminophen (TYLENOL) 325 MG tablet Take 650 mg by mouth every 6 (six) hours as needed.   . AMINO ACIDS-PROTEIN HYDROLYS PO Take 30 mLs  by mouth 2 (two) times daily.  Marland Kitchen aspirin EC 325 MG tablet Take 1 tablet (325 mg total) by mouth daily.  Marland Kitchen atorvastatin (LIPITOR) 20 MG tablet Take 1 tablet (20 mg total) by mouth at bedtime.  . benztropine (COGENTIN) 1 MG tablet Take 1 mg by mouth at bedtime.  . Calcium Carbonate-Vitamin D (CALCIUM 600-D) 600-400 MG-UNIT per tablet Take 1 tablet by mouth at bedtime.   . cetirizine (ZYRTEC) 10 MG tablet Take 5 mg by mouth at bedtime.   . cyanocobalamin 1000 MCG tablet Take 1,000 mcg by mouth daily.   Marland Kitchen dextromethorphan-guaiFENesin (MUCINEX DM) 30-600 MG 12hr tablet Take 1 tablet by mouth 2 (two) times daily as needed.   . docusate sodium (COLACE) 100 MG capsule Take 1 capsule (100 mg total) by mouth daily. For constipation  . donepezil (ARICEPT) 10 MG tablet Take 10 mg by mouth at bedtime.  Marland Kitchen escitalopram (LEXAPRO) 20 MG tablet Take 20 mg by mouth daily.  . fluticasone (FLONASE) 50 MCG/ACT nasal spray Place 1 spray into both nostrils daily.  Marland Kitchen glipiZIDE (GLUCOTROL) 5 MG tablet Take 5 mg by mouth 2 (two) times daily.  . Hypromellose 0.4 % SOLN Place 1 drop into both eyes 3 (three) times daily.  . insulin aspart (NOVOLOG) 100 UNIT/ML injection Inject 5 Units into the skin 3 (three) times daily before meals.  . Insulin Detemir (LEVEMIR FLEXPEN) 100 UNIT/ML Pen Inject 7 Units into the skin at bedtime.   Marland Kitchen Ketotifen Fumarate (ZADITOR OP) Place 1 drop into both eyes 2 (two) times daily.  Marland Kitchen losartan (COZAAR) 25 MG tablet Take 25 mg by mouth daily.  . Memantine HCl ER (NAMENDA XR) 28 MG CP24 Take 28 mg by mouth daily. For dementia  . Multiple Vitamins-Minerals (DECUBI-VITE) CAPS Take 1 capsule by mouth daily.  . Nutritional Supplements (NUTRITIONAL SUPPLEMENT PO) Take by mouth. HSG Puree texture, Nectar consistency  . OLANZapine (ZYPREXA) 5 MG tablet Take 5 mg by mouth at bedtime.  . pantoprazole (PROTONIX) 40 MG tablet Take 40 mg by mouth daily.  Marland Kitchen triamcinolone (KENALOG) 0.025 % ointment Apply 1  application topically daily. Apply to arms for skin integrity  . cholecalciferol (VITAMIN D) 1000 UNITS tablet Take 1,000 Units by mouth daily.   No facility-administered encounter medications on file as of 10/05/2016.       SIGNIFICANT DIAGNOSTIC EXAMS  12-30-14: right eye intraocular injection   02-06-16: ct of head: Atrophy and small vessel disease, similar to priors. No acute intracranial findings.  02-06-16: mri of brain: Atrophy and small vessel disease, similar to priors. No acute stroke is evident.  02-06-16: ct angio of  head and neck: CT HEADNo intracranial hemorrhage or CT evidence of large acute infarct. Chronic microvascular changes. No intracranial mass abnormal enhancement. Global atrophy without hydrocephalus.  CTA NECK Plaque right carotid bifurcation and proximal right internal carotid artery with less than 50% diameter narrowing. Mild plaque proximal left internal carotid artery with less than 50% diameter narrowing. Plaque with mild narrowing origin of vertebral arteries bilaterally. Left vertebral artery slightly dominant size. CTA HEAD Plaque with mild narrowing cavernous segment internal carotid artery bilaterally. Anterior circulation without medium or large size vessel significant stenosis or occlusion. Mild narrowing distal right vertebral artery. No significant stenosis left vertebral artery or basilar artery.    03-12-16: EEG: This is a mildly abnormal EEG due to the presence of  moderate generalized slowing which is indicative of bihemispheric dysfunction which is a nonspecific finding seen in a variety of degenerative, hypoxic, ischemic, toxic, metabolic etiologies. No definite epileptiform activity is noted   LABS REVIEWED:  12-09-15: glucose 256; bun 14.6; creat 1.01; k+ 4.5; na++ 137; liver normal albumin 3.5; chol 156; ldl 61; trig 246; hdl 45  hgb a1c 7.6  02-06-16: wbc 5.6; hgb 13.0; hct 40.3; mcv 86.1; plt 149; glucose 239; bun 8; creat 0.84; k+ 4.0;  na++ 132; liver normal albumin 3.1; blood culture: no growth; UA: neg 02-15-16: pre-albumin 10  03-14-16: urine micro-albumin <1.2  03-27-16: glucose 126; bun 11.7; creat 0.98; k+ 4.5; na++ 143; hgb a1c 7.8  07-02-16: wbc 8.8; hgb 12.5; hct 37.9 ;mcv 85.6; plt 219; glucose 83; bun 22.5; creat 1.12; k+ 4.8; na++ 140; ca 0.1; liver  Normal albumin 3.4  hgb a1c 7.4 chol 148; ldl 75; trig 165; hdl 40  07-25-16: wbc 7.8; hgb 13.0; hct 39.9; mcv 84.9;  glucose 203; bun 15.5; creat 0.73; k+ 4.4 na++ 138; ca 9.1; hgb a1c 7.2  07-26-16: chol 140; ldl 92; trig 142; hdl 43    Review of Systems Constitutional: Negative for malaise/fatigue.  Respiratory: Negative for cough and shortness of breath.   Cardiovascular: Negative for chest pain, palpitations and leg swelling.  Gastrointestinal: Negative for heartburn, abdominal pain and constipation.  Musculoskeletal: Negative for myalgias and joint pain.  Skin: Negative.   Neurological: Negative for headaches.  Psychiatric/Behavioral: The patient is not nervous/anxious.       Physical Exam Constitutional: No distress.  Overweight   Neck: Neck supple. No JVD present. No thyromegaly present.  Cardiovascular: Normal rate, regular rhythm and intact distal pulses.   Respiratory: Effort normal and breath sounds normal. No respiratory distress.  GI: Soft. Bowel sounds are normal. She exhibits no distension. There is no tenderness.  Musculoskeletal: She exhibits no edema.  Is able to move all extremities Has very little abnormal tongue movement  Neurological: She is alert.  Skin: Skin is warm and dry. She is not diaphoretic.    ASSESSMENT/ PLAN:  1. Dementia without behavior disturbance; is without change will continue aricept 10 mg daily and namenda xr 28 mg daily will not make changes will monitor   Her current weight is 177  pounds and is currently stable.   2. Tremor: she does not have a resting tremor present she does have a small amount of  lip  tremor. Will continue cogentin 1 mg nightly   3. Dyslipidemia: ldl 92; trig 142  will continue lipitor 20 mg daily   4. Diabetes  hgb a1c 7.2 (previous 7.8)  Will continue levemir  7 units;  will continue glipizide 5 mg twice daily  novolog  5 units with meals    5. Gerd: will continue protonix 40 mg daily   6. Constipation: will continue colace daily    7. Depression: will continue lexapro 20 mg nightly will monitor   8. Schizophrenia: is stable continue  zyprexa 5  mg nightly and will monitor   Is followed by psych services.  Will continue cogentin 1 mg nightly for tardive dyskinesia and will monitor  .   9. Macular degeneration: will contine   zaditor to both eyes twice daily   10. Allergic rhinitis: will continue flonase to both nares daily and zyrtec 10 mg daily   11. TIA: is presently stable; will continue asa 325 mg daily   12. Hypertension: b/p 108/64 will continue lisinopril 5 mg daily  Her health maintenance is up to date.   Time spent with patient  45  minutes >50% time spent counseling; reviewing medical record; tests; labs; and developing future plan of care   MD is aware of resident's narcotic use and is in agreement with current plan of care. We will wean dosage as appropriate for resident     Ok Edwards NP [facility]  Contact [phone] Monday through Friday 8am- 5pm  After hours call [phone]

## 2016-10-06 DIAGNOSIS — R1311 Dysphagia, oral phase: Secondary | ICD-10-CM | POA: Diagnosis not present

## 2016-10-06 DIAGNOSIS — F0391 Unspecified dementia with behavioral disturbance: Secondary | ICD-10-CM | POA: Diagnosis not present

## 2016-10-08 DIAGNOSIS — R1311 Dysphagia, oral phase: Secondary | ICD-10-CM | POA: Diagnosis not present

## 2016-10-08 DIAGNOSIS — F0391 Unspecified dementia with behavioral disturbance: Secondary | ICD-10-CM | POA: Diagnosis not present

## 2016-10-09 DIAGNOSIS — F0391 Unspecified dementia with behavioral disturbance: Secondary | ICD-10-CM | POA: Diagnosis not present

## 2016-10-09 DIAGNOSIS — R1311 Dysphagia, oral phase: Secondary | ICD-10-CM | POA: Diagnosis not present

## 2016-10-10 DIAGNOSIS — R1311 Dysphagia, oral phase: Secondary | ICD-10-CM | POA: Diagnosis not present

## 2016-10-10 DIAGNOSIS — F419 Anxiety disorder, unspecified: Secondary | ICD-10-CM | POA: Diagnosis not present

## 2016-10-10 DIAGNOSIS — F0391 Unspecified dementia with behavioral disturbance: Secondary | ICD-10-CM | POA: Diagnosis not present

## 2016-10-10 DIAGNOSIS — F259 Schizoaffective disorder, unspecified: Secondary | ICD-10-CM | POA: Diagnosis not present

## 2016-10-10 DIAGNOSIS — F329 Major depressive disorder, single episode, unspecified: Secondary | ICD-10-CM | POA: Diagnosis not present

## 2016-10-11 DIAGNOSIS — R1311 Dysphagia, oral phase: Secondary | ICD-10-CM | POA: Diagnosis not present

## 2016-10-11 DIAGNOSIS — F0391 Unspecified dementia with behavioral disturbance: Secondary | ICD-10-CM | POA: Diagnosis not present

## 2016-10-12 DIAGNOSIS — R1311 Dysphagia, oral phase: Secondary | ICD-10-CM | POA: Diagnosis not present

## 2016-10-12 DIAGNOSIS — F0391 Unspecified dementia with behavioral disturbance: Secondary | ICD-10-CM | POA: Diagnosis not present

## 2016-10-15 DIAGNOSIS — R1311 Dysphagia, oral phase: Secondary | ICD-10-CM | POA: Diagnosis not present

## 2016-10-15 DIAGNOSIS — F0391 Unspecified dementia with behavioral disturbance: Secondary | ICD-10-CM | POA: Diagnosis not present

## 2016-10-16 DIAGNOSIS — F0391 Unspecified dementia with behavioral disturbance: Secondary | ICD-10-CM | POA: Diagnosis not present

## 2016-10-16 DIAGNOSIS — R1311 Dysphagia, oral phase: Secondary | ICD-10-CM | POA: Diagnosis not present

## 2016-10-17 DIAGNOSIS — F0391 Unspecified dementia with behavioral disturbance: Secondary | ICD-10-CM | POA: Diagnosis not present

## 2016-10-17 DIAGNOSIS — R1311 Dysphagia, oral phase: Secondary | ICD-10-CM | POA: Diagnosis not present

## 2016-10-18 DIAGNOSIS — R1311 Dysphagia, oral phase: Secondary | ICD-10-CM | POA: Diagnosis not present

## 2016-10-18 DIAGNOSIS — F0391 Unspecified dementia with behavioral disturbance: Secondary | ICD-10-CM | POA: Diagnosis not present

## 2016-10-19 DIAGNOSIS — F0391 Unspecified dementia with behavioral disturbance: Secondary | ICD-10-CM | POA: Diagnosis not present

## 2016-10-19 DIAGNOSIS — R1311 Dysphagia, oral phase: Secondary | ICD-10-CM | POA: Diagnosis not present

## 2016-10-22 DIAGNOSIS — F0391 Unspecified dementia with behavioral disturbance: Secondary | ICD-10-CM | POA: Diagnosis not present

## 2016-10-22 DIAGNOSIS — R1311 Dysphagia, oral phase: Secondary | ICD-10-CM | POA: Diagnosis not present

## 2016-10-23 DIAGNOSIS — R1311 Dysphagia, oral phase: Secondary | ICD-10-CM | POA: Diagnosis not present

## 2016-10-23 DIAGNOSIS — F0391 Unspecified dementia with behavioral disturbance: Secondary | ICD-10-CM | POA: Diagnosis not present

## 2016-10-24 DIAGNOSIS — R1311 Dysphagia, oral phase: Secondary | ICD-10-CM | POA: Diagnosis not present

## 2016-10-24 DIAGNOSIS — F0391 Unspecified dementia with behavioral disturbance: Secondary | ICD-10-CM | POA: Diagnosis not present

## 2016-10-25 ENCOUNTER — Non-Acute Institutional Stay (SKILLED_NURSING_FACILITY): Payer: Medicare Other

## 2016-10-25 DIAGNOSIS — Z0000 Encounter for general adult medical examination without abnormal findings: Secondary | ICD-10-CM

## 2016-10-25 DIAGNOSIS — F0391 Unspecified dementia with behavioral disturbance: Secondary | ICD-10-CM | POA: Diagnosis not present

## 2016-10-25 DIAGNOSIS — R1311 Dysphagia, oral phase: Secondary | ICD-10-CM | POA: Diagnosis not present

## 2016-10-25 NOTE — Progress Notes (Signed)
Subjective:   Debra Tran is a 83 y.o. female who presents for an Initial Medicare Annual Wellness Visit at [facility]; incapacitated patient unable to answer questions appropriately        Objective:    Today's Vitals   10/25/16 1025  BP: (!) 150/66  Pulse: 78  Temp: 98 F (36.7 C)  TempSrc: Oral  SpO2: 96%  Weight: 178 lb (80.7 kg)  Height: 4\' 9"  (1.448 m)   Body mass index is 38.52 kg/m.   Current Medications (verified) Outpatient Encounter Prescriptions as of 10/25/2016  Medication Sig  . acetaminophen (TYLENOL) 325 MG tablet Take 650 mg by mouth every 6 (six) hours as needed.   . AMINO ACIDS-PROTEIN HYDROLYS PO Take 30 mLs by mouth 2 (two) times daily.  Marland Kitchen aspirin EC 325 MG tablet Take 1 tablet (325 mg total) by mouth daily.  Marland Kitchen atorvastatin (LIPITOR) 20 MG tablet Take 1 tablet (20 mg total) by mouth at bedtime.  . benztropine (COGENTIN) 1 MG tablet Take 1 mg by mouth at bedtime.  . Calcium Carbonate-Vitamin D (CALCIUM 600-D) 600-400 MG-UNIT per tablet Take 1 tablet by mouth at bedtime.   . cetirizine (ZYRTEC) 10 MG tablet Take 5 mg by mouth at bedtime.   . cholecalciferol (VITAMIN D) 1000 UNITS tablet Take 1,000 Units by mouth daily.  . cyanocobalamin 1000 MCG tablet Take 1,000 mcg by mouth daily.   Marland Kitchen dextromethorphan-guaiFENesin (MUCINEX DM) 30-600 MG 12hr tablet Take 1 tablet by mouth 2 (two) times daily as needed.   . docusate sodium (COLACE) 100 MG capsule Take 1 capsule (100 mg total) by mouth daily. For constipation  . donepezil (ARICEPT) 10 MG tablet Take 10 mg by mouth at bedtime.  Marland Kitchen escitalopram (LEXAPRO) 20 MG tablet Take 20 mg by mouth daily.  . fluticasone (FLONASE) 50 MCG/ACT nasal spray Place 1 spray into both nostrils daily.  Marland Kitchen glipiZIDE (GLUCOTROL) 5 MG tablet Take 5 mg by mouth 2 (two) times daily.  . Hypromellose 0.4 % SOLN Place 1 drop into both eyes 3 (three) times daily.  . insulin aspart (NOVOLOG) 100 UNIT/ML injection Inject 5  Units into the skin 3 (three) times daily before meals.  . Insulin Detemir (LEVEMIR FLEXPEN) 100 UNIT/ML Pen Inject 7 Units into the skin at bedtime.   Marland Kitchen Ketotifen Fumarate (ZADITOR OP) Place 1 drop into both eyes 2 (two) times daily.  Marland Kitchen losartan (COZAAR) 25 MG tablet Take 25 mg by mouth daily.  . Memantine HCl ER (NAMENDA XR) 28 MG CP24 Take 28 mg by mouth daily. For dementia  . Multiple Vitamins-Minerals (DECUBI-VITE) CAPS Take 1 capsule by mouth daily.  . Nutritional Supplements (NUTRITIONAL SUPPLEMENT PO) Take by mouth. HSG Puree texture, Nectar consistency  . OLANZapine (ZYPREXA) 5 MG tablet Take 5 mg by mouth at bedtime.  . pantoprazole (PROTONIX) 40 MG tablet Take 40 mg by mouth daily.  Marland Kitchen triamcinolone (KENALOG) 0.025 % ointment Apply 1 application topically daily. Apply to arms for skin integrity   No facility-administered encounter medications on file as of 10/25/2016.     Allergies (verified) Ace inhibitors and Ether   History: Past Medical History:  Diagnosis Date  . Allergic rhinitis   . CATARACT 06/06/2006   Qualifier: Diagnosis of  By: Genene Churn MD, Janett Billow    . Depressive disorder   . Diabetes mellitus type 2 in obese ([place])   . Essential hypertension, benign 02/24/2015  . Fungal dermatitis   . Hyperlipidemia   .  Hypertension   . Macular degeneration   . Mild cognitive impairment   . Narcissism   . Normocytic anemia   . Phobia   . Schizophrenia, paranoid ([place])   . Stress incontinence, female    Past Surgical History:  Procedure Laterality Date  . ABDOMINAL HYSTERECTOMY     due to fibroids  . CATARACT EXTRACTION  6 16 2006  . CRYOTHERAPY  2 15 2006   facial AK  . hyperplastic   4 20 2006   AK shave biopsy  . intraocular injection  [id number]  . TOTAL ABDOMINAL HYSTERECTOMY W/ BILATERAL SALPINGOOPHORECTOMY     due to endometriosis  non malignant   Family History  Problem Relation Age of Onset  . Heart disease Mother   . Heart disease Father   . Stroke Father    . Cancer Cousin        liver and colon  . Stroke Sister    Social History   Occupational History  . Not on file.   Social History Main Topics  . Smoking status: Former Research scientist (life sciences)  . Smokeless tobacco: Former Systems developer    Types: Chew  . Alcohol use No  . Drug use: No  . Sexual activity: Not on file    Tobacco Counseling Counseling given: Not Answered   Activities of Daily Living In your present state of health, do you have any difficulty performing the following activities: 10/25/2016  Hearing? Y  Vision? Y  Difficulty concentrating or making decisions? Y  Walking or climbing stairs? Y  Dressing or bathing? Y  Doing errands, shopping? Y  Preparing Food and eating ? Y  Using the Toilet? Y  In the past six months, have you accidently leaked urine? Y  Do you have problems with loss of bowel control? Y  Managing your Medications? Y  Managing your Finances? Y  Housekeeping or managing your Housekeeping? Y  Some recent data might be hidden    Immunizations and Health Maintenance Immunization History  Administered Date(s) Administered  . Influenza Split 02/15/2011, 01/21/2012  . Influenza Whole 02/05/2007, 01/17/2010, 01/28/2013  . Influenza-Unspecified 06/04/2014, 05/17/2015, 01/15/2016  . PPD Test 08/24/2015, 08/14/2016  . Pneumococcal Polysaccharide-23 04/10/1999  . Td 10/07/2001   There are no preventive care reminders to display for this patient.  Patient Care Team: Gildardo Cranker, DO as PCP - General (Internal Medicine) Hayden Pedro, MD as Attending Physician (Ophthalmology) Gerlene Fee, NP as Nurse Practitioner (Nurse Practitioner) Center, [place] ([place])  Indicate any recent Medical Services you may have received from other than [facility] providers in the past year (date may be approximate).     Assessment:   This is a routine wellness examination for Debra Tran.   Hearing/Vision screen No exam data present  Dietary issues and  exercise activities discussed: Current Exercise Habits: The patient does not participate in regular exercise at present, Exercise limited by: neurologic condition(s);psychological condition(s)  Goals    None     Depression Screen PHQ 2/9 Scores 10/25/2016 09/14/2014 01/21/2012 03/12/2011  PHQ - 2 Score - 0 0 4  PHQ- 9 Score - - - 7  Exception Documentation Medical reason - - -    Fall Risk Fall Risk  10/25/2016 02/27/2016 09/14/2014 05/29/2012  Falls in the past year? No No No -  Risk for fall due to : - - - (No Data)  Risk for fall due to (comments): - - - pt walks w/a walker    Cognitive Function: MMSE -  Mini Mental State Exam 12/13/2011  Orientation to time 1  Orientation to time comments 1  Orientation to Place 5  Registration 3  Attention/ Calculation 5  Recall 0  Language- name 2 objects 2  Language- repeat 1  Language- follow 3 step command 2  Language- read & follow direction 1  Write a sentence 1  Copy design 1  Total score 22     6CIT Screen 10/25/2016  What Year? 4 points  What month? 0 points  What time? 3 points  Count back from 20 4 points  Months in reverse 4 points  Repeat phrase 10 points  Total Score 25    Screening Tests Health Maintenance  Topic Date Due  . FOOT EXAM  08/28/2017 (Originally 08/10/2016)  . INFLUENZA VACCINE  11/07/2016  . OPHTHALMOLOGY EXAM  12/14/2016  . HEMOGLOBIN A1C  01/02/2017  . TETANUS/TDAP  08/09/2024  . DEXA SCAN  Completed  . PNA vac Low Risk Adult  Completed      Plan:    I have personally reviewed and addressed the Medicare Annual Wellness questionnaire and have noted the following in the patient's chart:  A. Medical and social history B. Use of alcohol, tobacco or illicit drugs  C. Current medications and supplements D. Functional ability and status E.  Nutritional status F.  Physical activity G. Advance directives H. List of other physicians I.  Hospitalizations, surgeries, and ER visits in previous 12  months J.  [place] to include hearing, vision, cognitive, depression L. Referrals and appointments - none  In addition, unable to review and discuss with incapacitated patient certain preventive protocols, quality metrics, and best practice recommendations. A written personalized care plan for preventive services as well as general preventive health recommendations were provided to patient.  See attached scanned questionnaire for additional information.   Signed,   Rich Reining, RN Nurse Health Advisor   Quick Notes   Health Maintenance: Foot exam, TDAP due     Abnormal Screen: 6 Cit-25     Patient Concerns: None     Nurse Concerns: None

## 2016-10-25 NOTE — Patient Instructions (Signed)
[name] , Thank you for taking time to come for your Medicare Wellness Visit. I appreciate your ongoing commitment to your health goals. Please review the following plan we discussed and let me know if I can assist you in the future.   Screening recommendations/referrals: Colonoscopy up to date, pt over age 81 Mammogram up to date, pt over age 6 Bone Density up to date Recommended yearly ophthalmology/optometry visit for glaucoma screening and checkup Recommended yearly dental visit for hygiene and checkup  Vaccinations: Influenza vaccine up to date. Due 01/14/17 Pneumococcal vaccine up to date Tdap vaccine due Shingles vaccine up to date    Advanced directives: Need a copy for chart  Conditions/risks identified: None  Next appointment: Dr. Eulas Post makes rounds   Preventive Care 65 Years and Older, Female Preventive care refers to lifestyle choices and visits with your health care provider that can promote health and wellness. What does preventive care include?  A yearly physical exam. This is also called an annual well check.  Dental exams once or twice a year.  Routine eye exams. Ask your health care provider how often you should have your eyes checked.  Personal lifestyle choices, including:  Daily care of your teeth and gums.  Regular physical activity.  Eating a healthy diet.  Avoiding tobacco and drug use.  Limiting alcohol use.  Practicing safe sex.  Taking low-dose aspirin every day.  Taking vitamin and mineral supplements as recommended by your health care provider. What happens during an annual well check? The services and screenings done by your health care provider during your annual well check will depend on your age, overall health, lifestyle risk factors, and family history of disease. Counseling  Your health care provider may ask you questions about your:  Alcohol use.  Tobacco use.  Drug use.  Emotional well-being.  Home and  relationship well-being.  Sexual activity.  Eating habits.  History of falls.  Memory and ability to understand (cognition).  Work and work Statistician.  Reproductive health. Screening  You may have the following tests or measurements:  Height, weight, and BMI.  Blood pressure.  Lipid and cholesterol levels. These may be checked every 5 years, or more frequently if you are over 5 years old.  Skin check.  Lung cancer screening. You may have this screening every year starting at age 14 if you have a 30-pack-year history of smoking and currently smoke or have quit within the past 15 years.  Fecal occult blood test (FOBT) of the stool. You may have this test every year starting at age 83.  Flexible sigmoidoscopy or colonoscopy. You may have a sigmoidoscopy every 5 years or a colonoscopy every 10 years starting at age 81.  Hepatitis C blood test.  Hepatitis B blood test.  Sexually transmitted disease (STD) testing.  Diabetes screening. This is done by checking your blood sugar (glucose) after you have not eaten for a while (fasting). You may have this done every 1-3 years.  Bone density scan. This is done to screen for osteoporosis. You may have this done starting at age 76.  Mammogram. This may be done every 1-2 years. Talk to your health care provider about how often you should have regular mammograms. Talk with your health care provider about your test results, treatment options, and if necessary, the need for more tests. Vaccines  Your health care provider may recommend certain vaccines, such as:  Influenza vaccine. This is recommended every year.  Tetanus, diphtheria, and acellular pertussis (  Tdap, Td) vaccine. You may need a Td booster every 10 years.  Zoster vaccine. You may need this after age 61.  Pneumococcal 13-valent conjugate (PCV13) vaccine. One dose is recommended after age 64.  Pneumococcal polysaccharide (PPSV23) vaccine. One dose is recommended after  age 80. Talk to your health care provider about which screenings and vaccines you need and how often you need them. This information is not intended to replace advice given to you by your health care provider. Make sure you discuss any questions you have with your health care provider. Document Released: 04/22/2015 Document Revised: 12/14/2015 Document Reviewed: 01/25/2015 Elsevier Interactive Patient Education  2017 [employer] Prevention in the Home Falls can cause injuries. They can happen to people of all ages. There are many things you can do to make your home safe and to help prevent falls. What can I do on the outside of my home?  Regularly fix the edges of walkways and driveways and fix any cracks.  Remove anything that might make you trip as you walk through a door, such as a raised step or threshold.  Trim any bushes or trees on the path to your home.  Use bright outdoor lighting.  Clear any walking paths of anything that might make someone trip, such as rocks or tools.  Regularly check to see if handrails are loose or broken. Make sure that both sides of any steps have handrails.  Any raised decks and porches should have guardrails on the edges.  Have any leaves, snow, or ice cleared regularly.  Use sand or salt on walking paths during winter.  Clean up any spills in your garage right away. This includes oil or grease spills. What can I do in the bathroom?  Use night lights.  Install grab bars by the toilet and in the tub and shower. Do not use towel bars as grab bars.  Use non-skid mats or decals in the tub or shower.  If you need to sit down in the shower, use a plastic, non-slip stool.  Keep the floor dry. Clean up any water that spills on the floor as soon as it happens.  Remove soap buildup in the tub or shower regularly.  Attach bath mats securely with double-sided non-slip rug tape.  Do not have throw rugs and other things on the floor that can  make you trip. What can I do in the bedroom?  Use night lights.  Make sure that you have a light by your bed that is easy to reach.  Do not use any sheets or blankets that are too big for your bed. They should not hang down onto the floor.  Have a firm chair that has side arms. You can use this for support while you get dressed.  Do not have throw rugs and other things on the floor that can make you trip. What can I do in the kitchen?  Clean up any spills right away.  Avoid walking on wet floors.  Keep items that you use a lot in easy-to-reach places.  If you need to reach something above you, use a strong step stool that has a grab bar.  Keep electrical cords out of the way.  Do not use floor polish or wax that makes floors slippery. If you must use wax, use non-skid floor wax.  Do not have throw rugs and other things on the floor that can make you trip. What can I do with my stairs?  Do  not leave any items on the stairs.  Make sure that there are handrails on both sides of the stairs and use them. Fix handrails that are broken or loose. Make sure that handrails are as long as the stairways.  Check any carpeting to make sure that it is firmly attached to the stairs. Fix any carpet that is loose or worn.  Avoid having throw rugs at the top or bottom of the stairs. If you do have throw rugs, attach them to the floor with carpet tape.  Make sure that you have a light switch at the top of the stairs and the bottom of the stairs. If you do not have them, ask someone to add them for you. What else can I do to help prevent falls?  Wear shoes that:  Do not have high heels.  Have rubber bottoms.  Are comfortable and fit you well.  Are closed at the toe. Do not wear sandals.  If you use a stepladder:  Make sure that it is fully opened. Do not climb a closed stepladder.  Make sure that both sides of the stepladder are locked into place.  Ask someone to hold it for you,  if possible.  Clearly mark and make sure that you can see:  Any grab bars or handrails.  First and last steps.  Where the edge of each step is.  Use tools that help you move around (mobility aids) if they are needed. These include:  Canes.  Walkers.  Scooters.  Crutches.  Turn on the lights when you go into a dark area. Replace any light bulbs as soon as they burn out.  Set up your furniture so you have a clear path. Avoid moving your furniture around.  If any of your floors are uneven, fix them.  If there are any pets around you, be aware of where they are.  Review your medicines with your doctor. Some medicines can make you feel dizzy. This can increase your chance of falling. Ask your doctor what other things that you can do to help prevent falls. This information is not intended to replace advice given to you by your health care provider. Make sure you discuss any questions you have with your health care provider. Document Released: 01/20/2009 Document Revised: 09/01/2015 Document Reviewed: 04/30/2014 Elsevier Interactive Patient Education  2017 [employer].

## 2016-10-26 DIAGNOSIS — R1311 Dysphagia, oral phase: Secondary | ICD-10-CM | POA: Diagnosis not present

## 2016-10-26 DIAGNOSIS — F0391 Unspecified dementia with behavioral disturbance: Secondary | ICD-10-CM | POA: Diagnosis not present

## 2016-10-29 DIAGNOSIS — F0391 Unspecified dementia with behavioral disturbance: Secondary | ICD-10-CM | POA: Diagnosis not present

## 2016-10-29 DIAGNOSIS — R1311 Dysphagia, oral phase: Secondary | ICD-10-CM | POA: Diagnosis not present

## 2016-10-30 DIAGNOSIS — F0391 Unspecified dementia with behavioral disturbance: Secondary | ICD-10-CM | POA: Diagnosis not present

## 2016-10-30 DIAGNOSIS — R1311 Dysphagia, oral phase: Secondary | ICD-10-CM | POA: Diagnosis not present

## 2016-10-31 DIAGNOSIS — F0391 Unspecified dementia with behavioral disturbance: Secondary | ICD-10-CM | POA: Diagnosis not present

## 2016-10-31 DIAGNOSIS — R1311 Dysphagia, oral phase: Secondary | ICD-10-CM | POA: Diagnosis not present

## 2016-11-01 ENCOUNTER — Non-Acute Institutional Stay (SKILLED_NURSING_FACILITY): Payer: Medicare Other | Admitting: Internal Medicine

## 2016-11-01 ENCOUNTER — Encounter: Payer: Self-pay | Admitting: Internal Medicine

## 2016-11-01 DIAGNOSIS — F0390 Unspecified dementia without behavioral disturbance: Secondary | ICD-10-CM

## 2016-11-01 DIAGNOSIS — L218 Other seborrheic dermatitis: Secondary | ICD-10-CM

## 2016-11-01 DIAGNOSIS — F209 Schizophrenia, unspecified: Secondary | ICD-10-CM

## 2016-11-01 DIAGNOSIS — Z794 Long term (current) use of insulin: Secondary | ICD-10-CM

## 2016-11-01 DIAGNOSIS — J3089 Other allergic rhinitis: Secondary | ICD-10-CM

## 2016-11-01 DIAGNOSIS — R1311 Dysphagia, oral phase: Secondary | ICD-10-CM | POA: Diagnosis not present

## 2016-11-01 DIAGNOSIS — I10 Essential (primary) hypertension: Secondary | ICD-10-CM

## 2016-11-01 DIAGNOSIS — F0391 Unspecified dementia with behavioral disturbance: Secondary | ICD-10-CM | POA: Diagnosis not present

## 2016-11-01 DIAGNOSIS — E11628 Type 2 diabetes mellitus with other skin complications: Secondary | ICD-10-CM

## 2016-11-01 NOTE — Progress Notes (Signed)
DATE:  November 01, 2016  Location:   [facility] Room Number: [facility] of Service: SNF (31)   Extended Emergency Contact Information Primary Emergency Contact: Zapanta,[name] Address: [address]          [place], [place] [address] [place] Phone: [phone] Relation: Daughter  Advanced Directive information Does Patient Have a Medical Advance Directive?: Yes, Type of Advance Directive: Out of facility DNR (pink MOST or yellow form), Pre-existing out of facility DNR order (yellow form or pink MOST form): Pink MOST form placed in chart (order not valid for inpatient use), Does patient want to make changes to medical advance directive?: No - Patient declined  Chief Complaint  Patient presents with  . Medical Management of Chronic Issues    1 month follow up    HPI:  83 yo female long term resident seen today for f/u. She has no concerns today. She is a poor historian due to dementia. Hx obtained from chart. AWV completed 10/25/16.  Dementia without behavior disturbance - stable on aricept 10 mg daily and namenda xr 28 mg daily. No behavior issues. Weight down 3 lbs  Tremor/tardive dyskinesia - stable on cogentin 1 mg nightly.  Hyperlipidemia - stable on lipitor. LDL 75; TG 165   DM - improved BS. A1c 7.4%. She takes levemir  7 units; glipizide 5 mg twice daily; novolog 5 units with meals. CBGs 110-160s; rarely <70    GERD - stable on protonix 40 mg daily   Constipation - stable on colace daily    Depression - stable on lexapro 20 mg nightly  Schizophrenia - mood stable on zyprexa 5  mg nightly; followed by psych services; she takes cogentin 1 mg nightly for tardive dyskinesia    Macular degeneration - OU; stable on zaditor to both eyes twice daily   Allergic rhinitis - stable on flonase to both nares daily and zyrtec 10 mg daily   Hx TIA - stable on ASA 325 mg daily   Hypertension - stable on lisinopril 5 mg daily. Takes ASA  daily  Past Medical History:  Diagnosis Date  . Allergic rhinitis   . CATARACT 06/06/2006   Qualifier: Diagnosis of  By: Genene Churn MD, Janett Billow    . Depressive disorder   . Diabetes mellitus type 2 in obese ([place])   . Essential hypertension, benign 02/24/2015  . Fungal dermatitis   . Hyperlipidemia   . Hypertension   . Macular degeneration   . Mild cognitive impairment   . Narcissism   . Normocytic anemia   . Phobia   . Schizophrenia, paranoid ([facility])   . Stress incontinence, female     Past Surgical History:  Procedure Laterality Date  . ABDOMINAL HYSTERECTOMY     due to fibroids  . CATARACT EXTRACTION  6 16 2006  . CRYOTHERAPY  2 15 2006   facial AK  . hyperplastic   4 20 2006   AK shave biopsy  . intraocular injection  [id number]  . TOTAL ABDOMINAL HYSTERECTOMY W/ BILATERAL SALPINGOOPHORECTOMY     due to endometriosis  non malignant    Patient Care Team: Gildardo Cranker, DO as PCP - General (Internal Medicine) Hayden Pedro, MD as Attending Physician (Ophthalmology) Nyoka Cowden Phylis Bougie, NP as Nurse Practitioner (Nurse Practitioner) [facility] [place] [facility]  Social History   Social History  . Marital status: Divorced    Spouse name: N/A  . Number of children: N/A  .  Years of education: N/A   Occupational History  . Not on file.   Social History Main Topics  . Smoking status: Former Research scientist (life sciences)  . Smokeless tobacco: Former Systems developer    Types: Chew  . Alcohol use No  . Drug use: No  . Sexual activity: Not on file   Other Topics Concern  . Not on file   Social History Narrative   Lives in [facility] (assisted living), remotely smoked and chewed tobacco. Daughter([name])  drives her, independent of all ADL's     reports that she has quit smoking. She has quit using smokeless tobacco. Her smokeless tobacco use included Chew. She reports that she does not drink alcohol or use drugs.  Family History  Problem Relation Age of Onset  .  Heart disease Mother   . Heart disease Father   . Stroke Father   . Cancer Cousin        liver and colon  . Stroke Sister    Family Status  Relation Status  . Mother Deceased  . Father Deceased  . Cousin (Not Specified)  . Sister Alive    Immunization History  Administered Date(s) Administered  . Influenza Split 02/15/2011, 01/21/2012  . Influenza Whole 02/05/2007, 01/17/2010, 01/28/2013  . Influenza-Unspecified 06/04/2014, 05/17/2015, 01/15/2016  . PPD Test 08/24/2015, 08/14/2016  . Pneumococcal Polysaccharide-23 04/10/1999  . Td 10/07/2001    Allergies  Allergen Reactions  . Ace Inhibitors   . Ether Nausea And Vomiting    Medications: Patient's Medications  New Prescriptions   No medications on file  Previous Medications   ACETAMINOPHEN (TYLENOL) 325 MG TABLET    Take 650 mg by mouth every 6 (six) hours as needed.    AMINO ACIDS-PROTEIN HYDROLYS PO    Take 30 mLs by mouth 2 (two) times daily.   ASPIRIN EC 325 MG TABLET    Take 1 tablet (325 mg total) by mouth daily.   ATORVASTATIN (LIPITOR) 20 MG TABLET    Take 1 tablet (20 mg total) by mouth at bedtime.   BENZTROPINE (COGENTIN) 1 MG TABLET    Take 1 mg by mouth at bedtime.   CALCIUM CARBONATE-VITAMIN D (CALCIUM 600-D) 600-400 MG-UNIT PER TABLET    Take 1 tablet by mouth at bedtime.    CETIRIZINE (ZYRTEC) 10 MG TABLET    Take 5 mg by mouth at bedtime.    CHOLECALCIFEROL (VITAMIN D) 1000 UNITS TABLET    Take 1,000 Units by mouth daily.   CYANOCOBALAMIN 1000 MCG TABLET    Take 1,000 mcg by mouth daily.    DEXTROMETHORPHAN-GUAIFENESIN (MUCINEX DM) 30-600 MG 12HR TABLET    Take 1 tablet by mouth 2 (two) times daily as needed.    DOCUSATE SODIUM (COLACE) 100 MG CAPSULE    Take 1 capsule (100 mg total) by mouth daily. For constipation   DONEPEZIL (ARICEPT) 10 MG TABLET    Take 10 mg by mouth at bedtime.   ESCITALOPRAM (LEXAPRO) 20 MG TABLET    Take 20 mg by mouth daily.   FLUTICASONE (FLONASE) 50 MCG/ACT NASAL SPRAY     Place 1 spray into both nostrils daily.   GLIPIZIDE (GLUCOTROL) 5 MG TABLET    Take 5 mg by mouth 2 (two) times daily.   HYPROMELLOSE 0.4 % SOLN    Place 1 drop into both eyes 3 (three) times daily.   INSULIN ASPART (NOVOLOG) 100 UNIT/ML INJECTION    Inject 5 Units into the skin 3 (three) times daily before meals.  INSULIN DETEMIR (LEVEMIR FLEXPEN) 100 UNIT/ML PEN    Inject 7 Units into the skin at bedtime.    LOSARTAN (COZAAR) 25 MG TABLET    Take 25 mg by mouth daily.   MEMANTINE HCL ER (NAMENDA XR) 28 MG CP24    Take 28 mg by mouth daily. For dementia   MULTIPLE VITAMINS-MINERALS (DECUBI-VITE) CAPS    Take 1 capsule by mouth daily.   NUTRITIONAL SUPPLEMENTS (NUTRITIONAL SUPPLEMENT PO)    Take by mouth. HSG Puree texture, Nectar consistency   OLANZAPINE (ZYPREXA) 5 MG TABLET    Take 5 mg by mouth at bedtime.   PANTOPRAZOLE (PROTONIX) 40 MG TABLET    Take 40 mg by mouth daily.   TRIAMCINOLONE (KENALOG) 0.025 % OINTMENT    Apply 1 application topically daily. Apply to arms for skin integrity  Modified Medications   No medications on file  Discontinued Medications   KETOTIFEN FUMARATE (ZADITOR OP)    Place 1 drop into both eyes 2 (two) times daily.    Review of Systems  Unable to perform ROS: Dementia    Vitals:   11/01/16 1127  BP: 126/78  Pulse: 62  Resp: 18  Temp: 97.8 F (36.6 C)  SpO2: 96%  Weight: 174 lb 8 oz (79.2 kg)  Height: 4\' 9"  (1.448 m)   Body mass index is 37.76 kg/m.  Physical Exam  Constitutional: She appears well-developed.  Frail appearing sitting in w/c in NAD  HENT:  Mouth/Throat: Oropharynx is clear and moist. No oropharyngeal exudate.  MMM; no oral thrush  Eyes: Pupils are equal, round, and reactive to light. No scleral icterus.  Neck: Neck supple. Carotid bruit is not present. No tracheal deviation present. No thyromegaly present.  Cardiovascular: Normal rate, regular rhythm and intact distal pulses.  Exam reveals no gallop and no friction rub.    Murmur (1/6 SEM) heard. Trace LE edema b/l. No calf TTP  Pulmonary/Chest: Effort normal and breath sounds normal. No stridor. No respiratory distress. She has no wheezes. She has no rales.  Abdominal: Soft. Normal appearance and bowel sounds are normal. She exhibits no distension and no mass. There is no hepatomegaly. There is no tenderness. There is no rigidity, no rebound and no guarding. No hernia.  obese  Musculoskeletal: She exhibits edema.  Lymphadenopathy:    She has no cervical adenopathy.  Neurological: She is alert.  Skin: Skin is warm and dry. Rash (seborrhea dermatitis yellowish plaques with no secondary signs of infection) noted.  Multiple SKs  Psychiatric: She has a normal mood and affect. Her behavior is normal.     Labs reviewed: No visits with results within 3 Month(s) from this visit.  Latest known visit with results is:  Abstract on 07/10/2016  Component Date Value Ref Range Status  . Glucose 07/02/2016 83  mg/dL Final  . BUN 07/02/2016 23* 4 - 21 mg/dL Final  . Creatinine 07/02/2016 1.1  0.5 - 1.1 mg/dL Final  . Potassium 07/02/2016 4.8  3.4 - 5.3 mmol/L Final  . Sodium 07/02/2016 140  137 - 147 mmol/L Final  . Triglycerides 07/02/2016 165* 40 - 160 mg/dL Final  . Cholesterol 07/02/2016 148  0 - 200 mg/dL Final  . HDL 07/02/2016 40  35 - 70 mg/dL Final  . LDL Cholesterol 07/02/2016 75  mg/dL Final    No results found.   Assessment/Plan   ICD-10-CM   1. Dementia without behavioral disturbance, unspecified dementia type F03.90   2. Essential hypertension, benign I10  3. Schizophrenia, unspecified type ([place]) F20.9   4. Chronic nonseasonal allergic rhinitis due to pollen J30.89   5. Type 2 diabetes mellitus with other skin complication, with long-term current use of insulin ([facility]) E11.628    Z79.4   6. Seborrhea corporis - stable; likely related to #5 L21.8     Cont current meds as ordered  Psych services to follow  PT/OT/ST as indicated  Will  follow  Dorothie Wah S. Perlie Gold  [facility] [address] [place], [place] [address] [phone] Cell (Monday-Friday 8 AM - 5 PM) [phone] After 5 PM and follow prompts

## 2016-11-02 DIAGNOSIS — L218 Other seborrheic dermatitis: Secondary | ICD-10-CM | POA: Insufficient documentation

## 2016-11-02 DIAGNOSIS — Z79899 Other long term (current) drug therapy: Secondary | ICD-10-CM | POA: Diagnosis not present

## 2016-11-02 DIAGNOSIS — E785 Hyperlipidemia, unspecified: Secondary | ICD-10-CM | POA: Diagnosis not present

## 2016-11-02 DIAGNOSIS — E119 Type 2 diabetes mellitus without complications: Secondary | ICD-10-CM | POA: Insufficient documentation

## 2016-11-02 DIAGNOSIS — F0391 Unspecified dementia with behavioral disturbance: Secondary | ICD-10-CM | POA: Diagnosis not present

## 2016-11-02 DIAGNOSIS — J3089 Other allergic rhinitis: Secondary | ICD-10-CM | POA: Insufficient documentation

## 2016-11-02 DIAGNOSIS — R1311 Dysphagia, oral phase: Secondary | ICD-10-CM | POA: Diagnosis not present

## 2016-11-02 LAB — LIPID PANEL
CHOLESTEROL: 162 (ref 0–200)
HDL: 45 (ref 35–70)
LDL Cholesterol: 85
TRIGLYCERIDES: 157 (ref 40–160)

## 2016-11-02 LAB — HEMOGLOBIN A1C: HEMOGLOBIN A1C: 6.9

## 2016-11-02 LAB — BASIC METABOLIC PANEL
BUN: 17 (ref 4–21)
CREATININE: 0.9 (ref 0.5–1.1)
Glucose: 198
POTASSIUM: 4.7 (ref 3.4–5.3)
SODIUM: 137 (ref 137–147)

## 2016-11-02 LAB — HEPATIC FUNCTION PANEL
ALT: 19 (ref 7–35)
AST: 21 (ref 13–35)
Alkaline Phosphatase: 102 (ref 25–125)
BILIRUBIN, TOTAL: 0.3

## 2016-11-05 DIAGNOSIS — F0391 Unspecified dementia with behavioral disturbance: Secondary | ICD-10-CM | POA: Diagnosis not present

## 2016-11-05 DIAGNOSIS — R1311 Dysphagia, oral phase: Secondary | ICD-10-CM | POA: Diagnosis not present

## 2016-11-06 DIAGNOSIS — F0391 Unspecified dementia with behavioral disturbance: Secondary | ICD-10-CM | POA: Diagnosis not present

## 2016-11-06 DIAGNOSIS — R1311 Dysphagia, oral phase: Secondary | ICD-10-CM | POA: Diagnosis not present

## 2016-11-07 DIAGNOSIS — M6281 Muscle weakness (generalized): Secondary | ICD-10-CM | POA: Diagnosis not present

## 2016-11-07 DIAGNOSIS — F0391 Unspecified dementia with behavioral disturbance: Secondary | ICD-10-CM | POA: Diagnosis not present

## 2016-11-07 DIAGNOSIS — R1311 Dysphagia, oral phase: Secondary | ICD-10-CM | POA: Diagnosis not present

## 2016-11-08 DIAGNOSIS — R1311 Dysphagia, oral phase: Secondary | ICD-10-CM | POA: Diagnosis not present

## 2016-11-08 DIAGNOSIS — F0391 Unspecified dementia with behavioral disturbance: Secondary | ICD-10-CM | POA: Diagnosis not present

## 2016-11-08 DIAGNOSIS — M6281 Muscle weakness (generalized): Secondary | ICD-10-CM | POA: Diagnosis not present

## 2016-11-09 DIAGNOSIS — R1311 Dysphagia, oral phase: Secondary | ICD-10-CM | POA: Diagnosis not present

## 2016-11-09 DIAGNOSIS — F0391 Unspecified dementia with behavioral disturbance: Secondary | ICD-10-CM | POA: Diagnosis not present

## 2016-11-09 DIAGNOSIS — M6281 Muscle weakness (generalized): Secondary | ICD-10-CM | POA: Diagnosis not present

## 2016-11-12 DIAGNOSIS — F0391 Unspecified dementia with behavioral disturbance: Secondary | ICD-10-CM | POA: Diagnosis not present

## 2016-11-12 DIAGNOSIS — R1311 Dysphagia, oral phase: Secondary | ICD-10-CM | POA: Diagnosis not present

## 2016-11-12 DIAGNOSIS — M6281 Muscle weakness (generalized): Secondary | ICD-10-CM | POA: Diagnosis not present

## 2016-11-13 DIAGNOSIS — M6281 Muscle weakness (generalized): Secondary | ICD-10-CM | POA: Diagnosis not present

## 2016-11-13 DIAGNOSIS — F0391 Unspecified dementia with behavioral disturbance: Secondary | ICD-10-CM | POA: Diagnosis not present

## 2016-11-13 DIAGNOSIS — R1311 Dysphagia, oral phase: Secondary | ICD-10-CM | POA: Diagnosis not present

## 2016-11-14 DIAGNOSIS — F0391 Unspecified dementia with behavioral disturbance: Secondary | ICD-10-CM | POA: Diagnosis not present

## 2016-11-14 DIAGNOSIS — R1311 Dysphagia, oral phase: Secondary | ICD-10-CM | POA: Diagnosis not present

## 2016-11-14 DIAGNOSIS — M6281 Muscle weakness (generalized): Secondary | ICD-10-CM | POA: Diagnosis not present

## 2016-11-15 DIAGNOSIS — F0391 Unspecified dementia with behavioral disturbance: Secondary | ICD-10-CM | POA: Diagnosis not present

## 2016-11-15 DIAGNOSIS — M6281 Muscle weakness (generalized): Secondary | ICD-10-CM | POA: Diagnosis not present

## 2016-11-15 DIAGNOSIS — R1311 Dysphagia, oral phase: Secondary | ICD-10-CM | POA: Diagnosis not present

## 2016-11-16 DIAGNOSIS — F0391 Unspecified dementia with behavioral disturbance: Secondary | ICD-10-CM | POA: Diagnosis not present

## 2016-11-16 DIAGNOSIS — R1311 Dysphagia, oral phase: Secondary | ICD-10-CM | POA: Diagnosis not present

## 2016-11-16 DIAGNOSIS — M6281 Muscle weakness (generalized): Secondary | ICD-10-CM | POA: Diagnosis not present

## 2016-11-19 ENCOUNTER — Non-Acute Institutional Stay (SKILLED_NURSING_FACILITY): Payer: Medicare Other | Admitting: Adult Health

## 2016-11-19 ENCOUNTER — Encounter: Payer: Self-pay | Admitting: Adult Health

## 2016-11-19 DIAGNOSIS — J690 Pneumonitis due to inhalation of food and vomit: Secondary | ICD-10-CM | POA: Diagnosis not present

## 2016-11-19 DIAGNOSIS — M6281 Muscle weakness (generalized): Secondary | ICD-10-CM | POA: Diagnosis not present

## 2016-11-19 DIAGNOSIS — F0391 Unspecified dementia with behavioral disturbance: Secondary | ICD-10-CM | POA: Diagnosis not present

## 2016-11-19 DIAGNOSIS — R1311 Dysphagia, oral phase: Secondary | ICD-10-CM | POA: Diagnosis not present

## 2016-11-19 NOTE — Progress Notes (Signed)
Location:   [facility] Room Number: 225 B Place of Service:  SNF (31)   CODE STATUS: full code  Allergies  Allergen Reactions  . Ace Inhibitors   . Ether Nausea And Vomiting    Chief Complaint  Patient presents with  . Acute Visit    Congestion    HPI:  Over this past weekend she developed a cough without sputum. There are reports of fever present. She did me that she is not feeling good. She did tell me that she had a hard time breathing. She does have a history of dysphagia and is on nectar thick liquids. More than likely she has aspirated. She is on mucinex as needed and this did not help her cough.    Past Medical History:  Diagnosis Date  . Allergic rhinitis   . CATARACT 06/06/2006   Qualifier: Diagnosis of  By: Genene Churn MD, Janett Billow    . Depressive disorder   . Diabetes mellitus type 2 in obese ([place])   . Essential hypertension, benign 02/24/2015  . Fungal dermatitis   . Hyperlipidemia   . Hypertension   . Macular degeneration   . Mild cognitive impairment   . Narcissism   . Normocytic anemia   . Phobia   . Schizophrenia, paranoid ([place])   . Stress incontinence, female     Past Surgical History:  Procedure Laterality Date  . ABDOMINAL HYSTERECTOMY     due to fibroids  . CATARACT EXTRACTION  6 16 2006  . CRYOTHERAPY  2 15 2006   facial AK  . hyperplastic   4 20 2006   AK shave biopsy  . intraocular injection  [id number]  . TOTAL ABDOMINAL HYSTERECTOMY W/ BILATERAL SALPINGOOPHORECTOMY     due to endometriosis  non malignant    Social History   Social History  . Marital status: Divorced    Spouse name: N/A  . Number of children: N/A  . Years of education: N/A   Occupational History  . Not on file.   Social History Main Topics  . Smoking status: Former Research scientist (life sciences)  . Smokeless tobacco: Former Systems developer    Types: Chew  . Alcohol use No  . Drug use: No  . Sexual activity: Not on file   Other Topics Concern  . Not on file   Social History  Narrative   Lives in [facility] (assisted living), remotely smoked and chewed tobacco. Daughter([name])  drives her, independent of all ADL's   Family History  Problem Relation Age of Onset  . Heart disease Mother   . Heart disease Father   . Stroke Father   . Cancer Cousin        liver and colon  . Stroke Sister       VITAL SIGNS BP 132/74   Pulse (!) 58   Temp (!) 97 F (36.1 C)   Resp 18   Ht 4\' 9"  (1.448 m)   Wt 177 lb 6.4 oz (80.5 kg)   SpO2 96%   BMI 38.39 kg/m   Patient's Medications  New Prescriptions   No medications on file  Previous Medications   ACETAMINOPHEN (TYLENOL) 325 MG TABLET    Take 650 mg by mouth every 6 (six) hours as needed.    AMINO ACIDS-PROTEIN HYDROLYS PO    Take 30 mLs by mouth 2 (two) times daily.   ASPIRIN EC 325 MG TABLET    Take 1 tablet (325 mg total) by mouth daily.   ATORVASTATIN (LIPITOR) 20  MG TABLET    Take 1 tablet (20 mg total) by mouth at bedtime.   BENZTROPINE (COGENTIN) 1 MG TABLET    Take 1 mg by mouth at bedtime.   CALCIUM CARBONATE-VITAMIN D (CALCIUM 600-D) 600-400 MG-UNIT PER TABLET    Take 1 tablet by mouth at bedtime.    CETIRIZINE (ZYRTEC) 10 MG TABLET    Take 5 mg by mouth at bedtime.    CHOLECALCIFEROL (VITAMIN D) 1000 UNITS TABLET    Take 1,000 Units by mouth daily.   CYANOCOBALAMIN 1000 MCG TABLET    Take 1,000 mcg by mouth daily.    DEXTROMETHORPHAN-GUAIFENESIN (MUCINEX DM) 30-600 MG 12HR TABLET    Take 1 tablet by mouth 2 (two) times daily as needed.    DOCUSATE SODIUM (COLACE) 100 MG CAPSULE    Take 1 capsule (100 mg total) by mouth daily. For constipation   DONEPEZIL (ARICEPT) 10 MG TABLET    Take 10 mg by mouth at bedtime.   ESCITALOPRAM (LEXAPRO) 20 MG TABLET    Take 20 mg by mouth daily.   FLUTICASONE (FLONASE) 50 MCG/ACT NASAL SPRAY    Place 1 spray into both nostrils daily.   GLIPIZIDE (GLUCOTROL) 5 MG TABLET    Take 5 mg by mouth 2 (two) times daily.   HYPROMELLOSE 0.4 % SOLN    Place 1 drop into  both eyes 3 (three) times daily.   INSULIN ASPART (NOVOLOG) 100 UNIT/ML INJECTION    Inject 5 Units into the skin 3 (three) times daily before meals. Notify MD if BS is less than 70 or greater than 450. Hold Insulin if BS is less than 70   INSULIN DETEMIR (LEVEMIR FLEXPEN) 100 UNIT/ML PEN    Inject 7 Units into the skin at bedtime.    LOSARTAN (COZAAR) 25 MG TABLET    Take 25 mg by mouth daily.   MEMANTINE HCL ER (NAMENDA XR) 28 MG CP24    Take 28 mg by mouth daily. For dementia   MULTIPLE VITAMINS-MINERALS (DECUBI-VITE) CAPS    Take 1 capsule by mouth daily.   NUTRITIONAL SUPPLEMENTS (NUTRITIONAL SUPPLEMENT PO)    Take by mouth. HSG Puree texture, Nectar consistency   OLANZAPINE (ZYPREXA) 5 MG TABLET    Take 5 mg by mouth at bedtime.   PANTOPRAZOLE (PROTONIX) 40 MG TABLET    Take 40 mg by mouth daily.   TRIAMCINOLONE (KENALOG) 0.025 % OINTMENT    Apply 1 application topically daily. Apply to arms for skin integrity  Modified Medications   No medications on file  Discontinued Medications   KETOTIFEN FUMARATE (ZADITOR OP)    Place 1 drop into both eyes 2 (two) times daily.     SIGNIFICANT DIAGNOSTIC EXAMS  TODAY  12-30-14: right eye intraocular injection   02-06-16: ct of head: Atrophy and small vessel disease, similar to priors. No acute intracranial findings.  02-06-16: mri of brain: Atrophy and small vessel disease, similar to priors. No acute stroke is evident.  02-06-16: ct angio of head and neck: CT HEADNo intracranial hemorrhage or CT evidence of large acute infarct. Chronic microvascular changes. No intracranial mass abnormal enhancement. Global atrophy without hydrocephalus.  CTA NECK Plaque right carotid bifurcation and proximal right internal carotid artery with less than 50% diameter narrowing. Mild plaque proximal left internal carotid artery with less than 50% diameter narrowing. Plaque with mild narrowing origin of vertebral arteries bilaterally. Left vertebral artery  slightly dominant size. CTA HEAD Plaque with mild narrowing cavernous segment internal carotid  artery bilaterally. Anterior circulation without medium or large size vessel significant stenosis or occlusion. Mild narrowing distal right vertebral artery. No significant stenosis left vertebral artery or basilar artery.    03-12-16: EEG: This is a mildly abnormal EEG due to the presence of  moderate generalized slowing which is indicative of bihemispheric dysfunction which is a nonspecific finding seen in a variety of degenerative, hypoxic, ischemic, toxic, metabolic etiologies. No definite epileptiform activity is noted   NO NEW EXAMS  LABS REVIEWED: PREVIOUS  12-09-15: glucose 256; bun 14.6; creat 1.01; k+ 4.5; na++ 137; liver normal albumin 3.5; chol 156; ldl 61; trig 246; hdl 45  hgb a1c 7.6  02-06-16: wbc 5.6; hgb 13.0; hct 40.3; mcv 86.1; plt 149; glucose 239; bun 8; creat 0.84; k+ 4.0; na++ 132; liver normal albumin 3.1; blood culture: no growth; UA: neg 02-15-16: pre-albumin 10  03-14-16: urine micro-albumin <1.2  03-27-16: glucose 126; bun 11.7; creat 0.98; k+ 4.5; na++ 143; hgb a1c 7.8  07-02-16: wbc 8.8; hgb 12.5; hct 37.9 ;mcv 85.6; plt 219; glucose 83; bun 22.5; creat 1.12; k+ 4.8; na++ 140; ca 0.1; liver  Normal albumin 3.4  hgb a1c 7.4 chol 148; ldl 75; trig 165; hdl 40  07-25-16: wbc 7.8; hgb 13.0; hct 39.9; mcv 84.9;  glucose 203; bun 15.5; creat 0.73; k+ 4.4 na++ 138; ca 9.1; hgb a1c 7.2  07-26-16: chol 140; ldl 92; trig 142; hdl 43   NO NEW LABS    Review of Systems  Constitutional:       Has fatigue   Respiratory: Positive for cough and shortness of breath. Negative for sputum production.   Cardiovascular: Negative for chest pain, palpitations and leg swelling.  Gastrointestinal: Negative for abdominal pain, constipation and heartburn.  Musculoskeletal: Negative for back pain, joint pain and myalgias.  Skin: Negative.   Neurological: Negative for dizziness.    Psychiatric/Behavioral: The patient is not nervous/anxious.     Physical Exam  Constitutional: No distress.  Slightly overweight   Eyes: Conjunctivae are normal.  Neck: Neck supple. No JVD present. No thyromegaly present.  Cardiovascular: Normal rate, regular rhythm and intact distal pulses.   Respiratory: Effort normal. No respiratory distress.  She has crackles and rhonchi throughout   GI: Soft. Bowel sounds are normal. She exhibits no distension. There is no tenderness.  Musculoskeletal: She exhibits no edema.  Able to move all extremities   Lymphadenopathy:    She has no cervical adenopathy.  Neurological: She is alert.  Very little abnormal tongue movement present   Skin: Skin is warm and dry. She is not diaphoretic.  Psychiatric: She has a normal mood and affect.     ASSESSMENT/ PLAN:  1. Aspiration pneumonia: will change his mucinex twice daily and will begin levequin 750 mg daily for 10 days with florastsor. Will monitor his status.    Ok Edwards NP [facility]  Contact [phone] Monday through Friday 8am- 5pm  After hours call [phone]

## 2016-11-20 DIAGNOSIS — F0391 Unspecified dementia with behavioral disturbance: Secondary | ICD-10-CM | POA: Diagnosis not present

## 2016-11-20 DIAGNOSIS — R1311 Dysphagia, oral phase: Secondary | ICD-10-CM | POA: Diagnosis not present

## 2016-11-20 DIAGNOSIS — M6281 Muscle weakness (generalized): Secondary | ICD-10-CM | POA: Diagnosis not present

## 2016-11-21 DIAGNOSIS — F0391 Unspecified dementia with behavioral disturbance: Secondary | ICD-10-CM | POA: Diagnosis not present

## 2016-11-21 DIAGNOSIS — R1311 Dysphagia, oral phase: Secondary | ICD-10-CM | POA: Diagnosis not present

## 2016-11-21 DIAGNOSIS — M6281 Muscle weakness (generalized): Secondary | ICD-10-CM | POA: Diagnosis not present

## 2016-11-22 DIAGNOSIS — R1311 Dysphagia, oral phase: Secondary | ICD-10-CM | POA: Diagnosis not present

## 2016-11-22 DIAGNOSIS — F0391 Unspecified dementia with behavioral disturbance: Secondary | ICD-10-CM | POA: Diagnosis not present

## 2016-11-22 DIAGNOSIS — M6281 Muscle weakness (generalized): Secondary | ICD-10-CM | POA: Diagnosis not present

## 2016-11-23 DIAGNOSIS — F0391 Unspecified dementia with behavioral disturbance: Secondary | ICD-10-CM | POA: Diagnosis not present

## 2016-11-23 DIAGNOSIS — M6281 Muscle weakness (generalized): Secondary | ICD-10-CM | POA: Diagnosis not present

## 2016-11-23 DIAGNOSIS — R1311 Dysphagia, oral phase: Secondary | ICD-10-CM | POA: Diagnosis not present

## 2016-11-26 DIAGNOSIS — F0391 Unspecified dementia with behavioral disturbance: Secondary | ICD-10-CM | POA: Diagnosis not present

## 2016-11-26 DIAGNOSIS — R1311 Dysphagia, oral phase: Secondary | ICD-10-CM | POA: Diagnosis not present

## 2016-11-26 DIAGNOSIS — M6281 Muscle weakness (generalized): Secondary | ICD-10-CM | POA: Diagnosis not present

## 2016-11-27 DIAGNOSIS — R1311 Dysphagia, oral phase: Secondary | ICD-10-CM | POA: Diagnosis not present

## 2016-11-27 DIAGNOSIS — F0391 Unspecified dementia with behavioral disturbance: Secondary | ICD-10-CM | POA: Diagnosis not present

## 2016-11-27 DIAGNOSIS — M6281 Muscle weakness (generalized): Secondary | ICD-10-CM | POA: Diagnosis not present

## 2016-11-28 DIAGNOSIS — H353133 Nonexudative age-related macular degeneration, bilateral, advanced atrophic without subfoveal involvement: Secondary | ICD-10-CM | POA: Diagnosis not present

## 2016-11-28 DIAGNOSIS — Z7984 Long term (current) use of oral hypoglycemic drugs: Secondary | ICD-10-CM | POA: Diagnosis not present

## 2016-11-28 DIAGNOSIS — F0391 Unspecified dementia with behavioral disturbance: Secondary | ICD-10-CM | POA: Diagnosis not present

## 2016-11-28 DIAGNOSIS — M6281 Muscle weakness (generalized): Secondary | ICD-10-CM | POA: Diagnosis not present

## 2016-11-28 DIAGNOSIS — Z794 Long term (current) use of insulin: Secondary | ICD-10-CM | POA: Diagnosis not present

## 2016-11-28 DIAGNOSIS — E119 Type 2 diabetes mellitus without complications: Secondary | ICD-10-CM | POA: Diagnosis not present

## 2016-11-28 DIAGNOSIS — R1311 Dysphagia, oral phase: Secondary | ICD-10-CM | POA: Diagnosis not present

## 2016-11-28 LAB — HM DIABETES EYE EXAM

## 2016-11-29 DIAGNOSIS — R1311 Dysphagia, oral phase: Secondary | ICD-10-CM | POA: Diagnosis not present

## 2016-11-29 DIAGNOSIS — F0391 Unspecified dementia with behavioral disturbance: Secondary | ICD-10-CM | POA: Diagnosis not present

## 2016-11-29 DIAGNOSIS — M6281 Muscle weakness (generalized): Secondary | ICD-10-CM | POA: Diagnosis not present

## 2016-11-30 DIAGNOSIS — R1311 Dysphagia, oral phase: Secondary | ICD-10-CM | POA: Diagnosis not present

## 2016-11-30 DIAGNOSIS — F0391 Unspecified dementia with behavioral disturbance: Secondary | ICD-10-CM | POA: Diagnosis not present

## 2016-11-30 DIAGNOSIS — M6281 Muscle weakness (generalized): Secondary | ICD-10-CM | POA: Diagnosis not present

## 2016-12-01 DIAGNOSIS — J690 Pneumonitis due to inhalation of food and vomit: Secondary | ICD-10-CM | POA: Insufficient documentation

## 2016-12-03 ENCOUNTER — Encounter: Payer: Self-pay | Admitting: Internal Medicine

## 2016-12-03 ENCOUNTER — Non-Acute Institutional Stay (SKILLED_NURSING_FACILITY): Payer: Medicare Other | Admitting: Internal Medicine

## 2016-12-03 DIAGNOSIS — D649 Anemia, unspecified: Secondary | ICD-10-CM | POA: Diagnosis not present

## 2016-12-03 DIAGNOSIS — R5383 Other fatigue: Secondary | ICD-10-CM

## 2016-12-03 DIAGNOSIS — F209 Schizophrenia, unspecified: Secondary | ICD-10-CM | POA: Diagnosis not present

## 2016-12-03 DIAGNOSIS — M6281 Muscle weakness (generalized): Secondary | ICD-10-CM | POA: Diagnosis not present

## 2016-12-03 DIAGNOSIS — E118 Type 2 diabetes mellitus with unspecified complications: Secondary | ICD-10-CM | POA: Diagnosis not present

## 2016-12-03 DIAGNOSIS — J3089 Other allergic rhinitis: Secondary | ICD-10-CM | POA: Diagnosis not present

## 2016-12-03 DIAGNOSIS — R4182 Altered mental status, unspecified: Secondary | ICD-10-CM | POA: Diagnosis not present

## 2016-12-03 DIAGNOSIS — F0391 Unspecified dementia with behavioral disturbance: Secondary | ICD-10-CM | POA: Diagnosis not present

## 2016-12-03 DIAGNOSIS — F0390 Unspecified dementia without behavioral disturbance: Secondary | ICD-10-CM | POA: Diagnosis not present

## 2016-12-03 DIAGNOSIS — R1311 Dysphagia, oral phase: Secondary | ICD-10-CM | POA: Diagnosis not present

## 2016-12-03 NOTE — Progress Notes (Signed)
Patient ID: Debra Tran, female   DOB: 17-Jul-1927, 83 y.o.   MRN: [id number]      DATE:  December 03, 2016  Location:   [place] Room Number: [place] [facility] of Service: SNF (31)   Extended Emergency Contact Information Primary Emergency Contact: Konitzer,[name] Address: [place], [place] [address] [place] Phone: [phone] Relation: Daughter  Advanced Directive information Does Patient Have a Medical Advance Directive?: Yes, Type of Advance Directive: Out of facility DNR (pink MOST or yellow form), Pre-existing out of facility DNR order (yellow form or pink MOST form): Pink MOST form placed in chart (order not valid for inpatient use), Does patient want to make changes to medical advance directive?: No - Patient declined  Chief Complaint  Patient presents with  . Acute Visit    Change in status    HPI:  83 yo female long term resident seen today for change in [place]. Nursing notes she is taking 3600mg  of mucinex BID instead of 600mg  BID. CBG 241; HR 63; Temp 97.25F. She c/o not feeling well but does not elaborate. She is a poor historian due to dementia. Hx obtained from chart. She was tx for aspiration pneumonia on 8/13th with 10 days levaquin.  Past Medical History:  Diagnosis Date  . Allergic rhinitis   . CATARACT 06/06/2006   Qualifier: Diagnosis of  By: Genene Churn MD, Janett Billow    . Depressive disorder   . Diabetes mellitus type 2 in obese ([place])   . Essential hypertension, benign 02/24/2015  . Fungal dermatitis   . Hyperlipidemia   . Hypertension   . Macular degeneration   . Mild cognitive impairment   . Narcissism   . Normocytic anemia   . Phobia   . Schizophrenia, paranoid ([place])   . Stress incontinence, female     Past Surgical History:  Procedure Laterality Date  . ABDOMINAL HYSTERECTOMY     due to fibroids  . CATARACT EXTRACTION  6 16 2006  . CRYOTHERAPY  2 15 2006   facial AK  . hyperplastic   4 20 2006   AK  shave biopsy  . intraocular injection  [id number]  . TOTAL ABDOMINAL HYSTERECTOMY W/ BILATERAL SALPINGOOPHORECTOMY     due to endometriosis  non malignant    Patient Care Team: Gildardo Cranker, DO as PCP - General (Internal Medicine) Hayden Pedro, MD as Attending Physician (Ophthalmology) Nyoka Cowden Phylis Bougie, NP as Nurse Practitioner (Nurse Practitioner) [facility], [place] ([place])  Social History   Social History  . Marital status: Divorced    Spouse name: N/A  . Number of children: N/A  . Years of education: N/A   Occupational History  . Not on file.   Social History Main Topics  . Smoking status: Former Research scientist (life sciences)  . Smokeless tobacco: Former Systems developer    Types: Chew  . Alcohol use No  . Drug use: No  . Sexual activity: Not on file   Other Topics Concern  . Not on file   Social History Narrative   Lives in [facility] (assisted living), remotely smoked and chewed tobacco. Daughter([name])  drives her, independent of all ADL's     reports that she has quit smoking. She has quit using smokeless tobacco. Her smokeless tobacco use included Chew. She reports that she does not drink alcohol or use drugs.  Family History  Problem Relation Age of Onset  .  Heart disease Mother   . Heart disease Father   . Stroke Father   . Cancer Cousin        liver and colon  . Stroke Sister    Family Status  Relation Status  . Mother Deceased  . Father Deceased  . Cousin (Not Specified)  . Sister Alive    Immunization History  Administered Date(s) Administered  . Influenza Split 02/15/2011, 01/21/2012  . Influenza Whole 02/05/2007, 01/17/2010, 01/28/2013  . Influenza-Unspecified 06/04/2014, 05/17/2015, 01/15/2016  . PPD Test 08/24/2015, 08/14/2016  . Pneumococcal Polysaccharide-23 04/10/1999  . Td 10/07/2001    Allergies  Allergen Reactions  . Ace Inhibitors   . Ether Nausea And Vomiting    Medications: Patient's Medications  New Prescriptions    No medications on file  Previous Medications   ACETAMINOPHEN (TYLENOL) 325 MG TABLET    Take 650 mg by mouth every 6 (six) hours as needed.    AMINO ACIDS-PROTEIN HYDROLYS PO    Take 30 mLs by mouth 2 (two) times daily.   ASPIRIN EC 325 MG TABLET    Take 1 tablet (325 mg total) by mouth daily.   ATORVASTATIN (LIPITOR) 20 MG TABLET    Take 1 tablet (20 mg total) by mouth at bedtime.   BENZTROPINE (COGENTIN) 1 MG TABLET    Take 1 mg by mouth at bedtime.   CALCIUM CARBONATE-VITAMIN D (CALCIUM 600-D) 600-400 MG-UNIT PER TABLET    Take 1 tablet by mouth at bedtime.    CETIRIZINE (ZYRTEC) 10 MG TABLET    Take 5 mg by mouth at bedtime.    CHOLECALCIFEROL (VITAMIN D) 1000 UNITS TABLET    Take 1,000 Units by mouth daily.   CYANOCOBALAMIN 1000 MCG TABLET    Take 1,000 mcg by mouth daily.    DOCUSATE SODIUM (COLACE) 100 MG CAPSULE    Take 1 capsule (100 mg total) by mouth daily. For constipation   DONEPEZIL (ARICEPT) 10 MG TABLET    Take 10 mg by mouth at bedtime.   ESCITALOPRAM (LEXAPRO) 20 MG TABLET    Take 20 mg by mouth daily.   FLUTICASONE (FLONASE) 50 MCG/ACT NASAL SPRAY    Place 1 spray into both nostrils daily.   GLIPIZIDE (GLUCOTROL) 5 MG TABLET    Take 5 mg by mouth 2 (two) times daily.   GUAIFENESIN (MUCINEX) 600 MG 12 HR TABLET    Take 600 mg by mouth 2 (two) times daily.   HYPROMELLOSE 0.4 % SOLN    Place 1 drop into both eyes 3 (three) times daily.   INSULIN ASPART (NOVOLOG) 100 UNIT/ML INJECTION    Inject 5 Units into the skin 3 (three) times daily before meals. Notify MD if BS is less than 70 or greater than 450. Hold Insulin if BS is less than 70   INSULIN DETEMIR (LEVEMIR FLEXPEN) 100 UNIT/ML PEN    Inject 7 Units into the skin at bedtime.    LOSARTAN (COZAAR) 25 MG TABLET    Take 25 mg by mouth daily.   MEMANTINE HCL ER (NAMENDA XR) 28 MG CP24    Take 28 mg by mouth daily. For dementia   MULTIPLE VITAMINS-MINERALS (DECUBI-VITE) CAPS    Take 1 capsule by mouth daily.   NUTRITIONAL  SUPPLEMENTS (NUTRITIONAL SUPPLEMENT PO)    Take by mouth. HSG Puree texture, Nectar consistency.  May have Bananas, soft cookies and soft centered sandwiches with distant supervision.  Large portions at meals   OLANZAPINE (ZYPREXA) 5 MG TABLET  Take 5 mg by mouth at bedtime.   PANTOPRAZOLE (PROTONIX) 40 MG TABLET    Take 40 mg by mouth daily.   TRIAMCINOLONE (KENALOG) 0.025 % OINTMENT    Apply 1 application topically daily. Apply to arms for skin integrity  Modified Medications   No medications on file  Discontinued Medications   DEXTROMETHORPHAN-GUAIFENESIN (MUCINEX DM) 30-600 MG 12HR TABLET    Take 1 tablet by mouth 2 (two) times daily as needed.     Review of Systems  Unable to perform ROS: Dementia    Vitals:   12/03/16 1200  BP: 132/82  Pulse: 76  Resp: 18  Temp: (!) 97.4 F (36.3 C)  SpO2: 97%  Weight: 176 lb 4.8 oz (80 kg)  Height: 4\' 9"  (1.448 m)   Body mass index is 38.15 kg/m.  Physical Exam  Constitutional: She appears well-developed.  Frail appearing, sitting up in bed in NAD, lethargic  HENT:  Mouth/Throat: No oropharyngeal exudate.  MM dry; no oral thrush  Eyes: Pupils are equal, round, and reactive to light. No scleral icterus.  Neck: Neck supple. Carotid bruit is not present. No tracheal deviation present.  Cardiovascular: Normal rate, regular rhythm and intact distal pulses.  Exam reveals no gallop and no friction rub.   Murmur (1/6 SEM) heard. No LE edema b/l. no calf TTP.   Pulmonary/Chest: Effort normal and breath sounds normal. No stridor. No respiratory distress. She has no wheezes. She has no rales.  Abdominal: Soft. Normal appearance and bowel sounds are normal. She exhibits no distension and no mass. There is no hepatomegaly. There is no tenderness. There is no rigidity, no rebound and no guarding. No hernia.  Musculoskeletal: She exhibits edema.  Lymphadenopathy:    She has no cervical adenopathy.  Neurological: She is alert.  Skin: Skin is  warm and dry. No rash noted.  Psychiatric: She has a normal mood and affect. Her behavior is normal.     Labs reviewed: Abstract on 11/06/2016  Component Date Value Ref Range Status  . Glucose 11/02/2016 198   Final  . BUN 11/02/2016 17  4 - 21 Final  . Creatinine 11/02/2016 0.9  0.5 - 1.1 Final  . Potassium 11/02/2016 4.7  3.4 - 5.3 Final  . Sodium 11/02/2016 137  137 - 147 Final  . Triglycerides 11/02/2016 157  40 - 160 Final  . Cholesterol 11/02/2016 162  0 - 200 Final  . HDL 11/02/2016 45  35 - 70 Final  . LDL Cholesterol 11/02/2016 85   Final  . Alkaline Phosphatase 11/02/2016 102  25 - 125 Final  . ALT 11/02/2016 19  7 - 35 Final  . AST 11/02/2016 21  13 - 35 Final  . Bilirubin, Total 11/02/2016 0.3   Final  . Hemoglobin A1C 11/02/2016 6.9   Final    No results found.   Assessment/Plan   ICD-10-CM   1. Lethargy R53.83   2. Dementia without behavioral disturbance, unspecified dementia type F03.90   3. Chronic nonseasonal allergic rhinitis due to pollen J30.89   4. Schizophrenia, unspecified type ([place]) F20.9     Check cbc, cmp, ua  Check CXR 2 view  Change B12 to 1025mcg daily  Change mucinex to 600mg  BID  Cont current meds as ordered  Cont nutritional supplements as ordered  Will follow  Asiana Benninger S. Perlie Gold  [facility] [address] [place], [place] [address] [phone] Cell (Monday-Friday 8  AM - 5 PM) [phone] After 5 PM and follow prompts  

## 2016-12-04 DIAGNOSIS — F0391 Unspecified dementia with behavioral disturbance: Secondary | ICD-10-CM | POA: Diagnosis not present

## 2016-12-04 DIAGNOSIS — R1311 Dysphagia, oral phase: Secondary | ICD-10-CM | POA: Diagnosis not present

## 2016-12-04 DIAGNOSIS — M6281 Muscle weakness (generalized): Secondary | ICD-10-CM | POA: Diagnosis not present

## 2016-12-06 ENCOUNTER — Non-Acute Institutional Stay (SKILLED_NURSING_FACILITY): Payer: Medicare Other | Admitting: Adult Health

## 2016-12-06 ENCOUNTER — Encounter: Payer: Self-pay | Admitting: Adult Health

## 2016-12-06 DIAGNOSIS — R319 Hematuria, unspecified: Secondary | ICD-10-CM | POA: Diagnosis not present

## 2016-12-06 DIAGNOSIS — G2401 Drug induced subacute dyskinesia: Secondary | ICD-10-CM | POA: Diagnosis not present

## 2016-12-06 DIAGNOSIS — Z79899 Other long term (current) drug therapy: Secondary | ICD-10-CM | POA: Diagnosis not present

## 2016-12-06 DIAGNOSIS — E1169 Type 2 diabetes mellitus with other specified complication: Secondary | ICD-10-CM | POA: Diagnosis not present

## 2016-12-06 DIAGNOSIS — E785 Hyperlipidemia, unspecified: Secondary | ICD-10-CM

## 2016-12-06 DIAGNOSIS — F0390 Unspecified dementia without behavioral disturbance: Secondary | ICD-10-CM

## 2016-12-06 DIAGNOSIS — N390 Urinary tract infection, site not specified: Secondary | ICD-10-CM | POA: Diagnosis not present

## 2016-12-06 DIAGNOSIS — E119 Type 2 diabetes mellitus without complications: Secondary | ICD-10-CM

## 2016-12-06 DIAGNOSIS — R1312 Dysphagia, oropharyngeal phase: Secondary | ICD-10-CM | POA: Diagnosis not present

## 2016-12-06 DIAGNOSIS — Z794 Long term (current) use of insulin: Secondary | ICD-10-CM

## 2016-12-06 DIAGNOSIS — E118 Type 2 diabetes mellitus with unspecified complications: Secondary | ICD-10-CM | POA: Diagnosis not present

## 2016-12-06 NOTE — Progress Notes (Signed)
Location:   [place] Room Number: 225 B Place of Service:  SNF (31)   CODE STATUS: Full Code  Allergies  Allergen Reactions  . Ace Inhibitors   . Ether Nausea And Vomiting    Chief Complaint  Patient presents with  . Medical Management of Chronic Issues    dementia; tremor; dyslipidemia; diabetes     HPI:  She is a 83 year old long term resident of this facility being seen for the management of her chronic illnesses: dementia; tremor dyslipidemia; diabetes. She is complaining of a cough. She has had a chest x-ray; which did not show any acute disease. She says that she is coughing while she eats. She is on nectar thick liquids. There are no reports of fever present. She denies any chest pain insomnia or poor appetite.    Past Medical History:  Diagnosis Date  . Allergic rhinitis   . CATARACT 06/06/2006   Qualifier: Diagnosis of  By: Genene Churn MD, Janett Billow    . Depressive disorder   . Diabetes mellitus type 2 in obese ([place])   . Essential hypertension, benign 02/24/2015  . Fungal dermatitis   . Hyperlipidemia   . Hypertension   . Macular degeneration   . Mild cognitive impairment   . Narcissism   . Normocytic anemia   . Phobia   . Schizophrenia, paranoid ([place])   . Stress incontinence, female     Past Surgical History:  Procedure Laterality Date  . ABDOMINAL HYSTERECTOMY     due to fibroids  . CATARACT EXTRACTION  6 16 2006  . CRYOTHERAPY  2 15 2006   facial AK  . hyperplastic   4 20 2006   AK shave biopsy  . intraocular injection  [id number]  . TOTAL ABDOMINAL HYSTERECTOMY W/ BILATERAL SALPINGOOPHORECTOMY     due to endometriosis  non malignant    Social History   Social History  . Marital status: Divorced    Spouse name: N/A  . Number of children: N/A  . Years of education: N/A   Occupational History  . Not on file.   Social History Main Topics  . Smoking status: Former Research scientist (life sciences)  . Smokeless tobacco: Former Systems developer    Types: Chew  . Alcohol  use No  . Drug use: No  . Sexual activity: Not on file   Other Topics Concern  . Not on file   Social History Narrative   Lives in [facility] (assisted living), remotely smoked and chewed tobacco. Daughter([name])  drives her, independent of all ADL's   Family History  Problem Relation Age of Onset  . Heart disease Mother   . Heart disease Father   . Stroke Father   . Cancer Cousin        liver and colon  . Stroke Sister       VITAL SIGNS BP (!) 162/88   Pulse (!) 58   Temp (!) 97.5 F (36.4 C)   Resp 18   Ht 4\' 9"  (1.448 m)   Wt 176 lb 4.8 oz (80 kg)   SpO2 99%   BMI 38.15 kg/m   Patient's Medications  New Prescriptions   No medications on file  Previous Medications   ACETAMINOPHEN (TYLENOL) 325 MG TABLET    Take 650 mg by mouth every 6 (six) hours as needed.    AMINO ACIDS-PROTEIN HYDROLYS PO    Take 30 mLs by mouth 2 (two) times daily.   ASPIRIN EC 325 MG TABLET  Take 1 tablet (325 mg total) by mouth daily.   ATORVASTATIN (LIPITOR) 20 MG TABLET    Take 1 tablet (20 mg total) by mouth at bedtime.   BENZTROPINE (COGENTIN) 1 MG TABLET    Take 1 mg by mouth at bedtime.   CALCIUM CARBONATE-VITAMIN D (CALCIUM 600-D) 600-400 MG-UNIT PER TABLET    Take 1 tablet by mouth at bedtime.    CETIRIZINE (ZYRTEC) 10 MG TABLET    Take 5 mg by mouth at bedtime.    CHOLECALCIFEROL (VITAMIN D) 1000 UNITS TABLET    Take 1,000 Units by mouth daily.   CYANOCOBALAMIN 1000 MCG TABLET    Take 1,000 mcg by mouth daily.    DOCUSATE SODIUM (COLACE) 100 MG CAPSULE    Take 1 capsule (100 mg total) by mouth daily. For constipation   DONEPEZIL (ARICEPT) 10 MG TABLET    Take 10 mg by mouth at bedtime.   ESCITALOPRAM (LEXAPRO) 20 MG TABLET    Take 20 mg by mouth daily.   FLUTICASONE (FLONASE) 50 MCG/ACT NASAL SPRAY    Place 1 spray into both nostrils daily.   GLIPIZIDE (GLUCOTROL) 5 MG TABLET    Take 5 mg by mouth 2 (two) times daily.   GUAIFENESIN (MUCINEX) 600 MG 12 HR TABLET    Take  600 mg by mouth 2 (two) times daily as needed.    HYPROMELLOSE 0.4 % SOLN    Place 1 drop into both eyes 3 (three) times daily.   INSULIN ASPART (NOVOLOG) 100 UNIT/ML INJECTION    Inject 5 Units into the skin 3 (three) times daily before meals. Notify MD if BS is less than 70 or greater than 450. Hold Insulin if BS is less than 70   INSULIN DETEMIR (LEVEMIR FLEXPEN) 100 UNIT/ML PEN    Inject 7 Units into the skin at bedtime.    LOSARTAN (COZAAR) 25 MG TABLET    Take 25 mg by mouth daily.   MEMANTINE HCL ER (NAMENDA XR) 28 MG CP24    Take 28 mg by mouth daily. For dementia   MULTIPLE VITAMINS-MINERALS (DECUBI-VITE) CAPS    Take 1 capsule by mouth daily.   NUTRITIONAL SUPPLEMENTS (NUTRITIONAL SUPPLEMENT PO)    Take by mouth. HSG Puree texture, Nectar consistency.  May have Bananas, soft cookies and soft centered sandwiches with distant supervision.  Large portions at meals   OLANZAPINE (ZYPREXA) 5 MG TABLET    Take 5 mg by mouth at bedtime.   PANTOPRAZOLE (PROTONIX) 40 MG TABLET    Take 40 mg by mouth daily.   TRIAMCINOLONE (KENALOG) 0.025 % OINTMENT    Apply 1 application topically daily. Apply to arms for skin integrity  Modified Medications   No medications on file  Discontinued Medications   No medications on file     SIGNIFICANT DIAGNOSTIC EXAMS  TODAY  12-30-14: right eye intraocular injection   02-06-16: ct of head: Atrophy and small vessel disease, similar to priors. No acute intracranial findings.  02-06-16: mri of brain: Atrophy and small vessel disease, similar to priors. No acute stroke is evident.  02-06-16: ct angio of head and neck: CT HEADNo intracranial hemorrhage or CT evidence of large acute infarct. Chronic microvascular changes. No intracranial mass abnormal enhancement. Global atrophy without hydrocephalus.  CTA NECK Plaque right carotid bifurcation and proximal right internal carotid artery with less than 50% diameter narrowing. Mild plaque proximal left  internal carotid artery with less than 50% diameter narrowing. Plaque with mild narrowing origin of  vertebral arteries bilaterally. Left vertebral artery slightly dominant size. CTA HEAD Plaque with mild narrowing cavernous segment internal carotid artery bilaterally. Anterior circulation without medium or large size vessel significant stenosis or occlusion. Mild narrowing distal right vertebral artery. No significant stenosis left vertebral artery or basilar artery.    03-12-16: EEG: This is a mildly abnormal EEG due to the presence of  moderate generalized slowing which is indicative of bihemispheric dysfunction which is a nonspecific finding seen in a variety of degenerative, hypoxic, ischemic, toxic, metabolic etiologies. No definite epileptiform activity is noted   TODAY:  12-03-16: chest x-ray: no acute cardiopulmonary disease   LABS REVIEWED: PREVIOUS  12-09-15: glucose 256; bun 14.6; creat 1.01; k+ 4.5; na++ 137; liver normal albumin 3.5; chol 156; ldl 61; trig 246; hdl 45  hgb a1c 7.6  02-06-16: wbc 5.6; hgb 13.0; hct 40.3; mcv 86.1; plt 149; glucose 239; bun 8; creat 0.84; k+ 4.0; na++ 132; liver normal albumin 3.1; blood culture: no growth; UA: neg 02-15-16: pre-albumin 10  03-14-16: urine micro-albumin <1.2  03-27-16: glucose 126; bun 11.7; creat 0.98; k+ 4.5; na++ 143; hgb a1c 7.8  07-02-16: wbc 8.8; hgb 12.5; hct 37.9 ;mcv 85.6; plt 219; glucose 83; bun 22.5; creat 1.12; k+ 4.8; na++ 140; ca 0.1; liver  Normal albumin 3.4  hgb a1c 7.4 chol 148; ldl 75; trig 165; hdl 40  07-25-16: wbc 7.8; hgb 13.0; hct 39.9; mcv 84.9;  glucose 203; bun 15.5; creat 0.73; k+ 4.4 na++ 138; ca 9.1; hgb a1c 7.2  07-26-16: chol 140; ldl 92; trig 142; hdl 43   TODAY:  11-02-16: glucose 198; bun 17.0; creat 0.89; k+ 4.7; na++ 137; liver normal albumin 4.0 hgb a1c 6.9; chol 162; ldl 85; trig 157; hdl 45   Review of Systems  Constitutional: Positive for malaise/fatigue.  Respiratory: Positive for cough.  Negative for sputum production and shortness of breath.   Cardiovascular: Negative for chest pain, palpitations and leg swelling.  Gastrointestinal: Negative for abdominal pain, constipation and heartburn.  Musculoskeletal: Negative for back pain, joint pain and myalgias.  Skin: Negative.   Neurological: Negative for dizziness.  Psychiatric/Behavioral: The patient is not nervous/anxious.    Physical Exam  Constitutional: No distress.  Slightly overweight   Eyes: Conjunctivae are normal.  Neck: Neck supple. No JVD present. No thyromegaly present.  Cardiovascular: Normal rate, regular rhythm and intact distal pulses.   Respiratory: Effort normal and breath sounds normal. No respiratory distress.  GI: Soft. Bowel sounds are normal. She exhibits no distension. There is no tenderness.  Musculoskeletal: She exhibits no edema.  Able to move all extremities   Lymphadenopathy:    She has no cervical adenopathy.  Neurological: She is alert.  Very little abnormal tongue movement present   Skin: Skin is warm and dry. She is not diaphoretic.  Psychiatric: She has a normal mood and affect.    ASSESSMENT/ PLAN:   TODAY:   1. Dementia without behavior disturbance; is without change will continue aricept 10 mg daily and namenda xr 28 mg daily will not make changes will monitor   Her current weight is 176  pounds and is currently stable.   2. Tremor: she does not have a resting tremor present she does have a small amount of  lip tremor. Will continue cogentin 1 mg nightly   3. Dyslipidemia: ldl 45; trig 162  will continue lipitor 20 mg daily   4. Diabetes  hgb a1c 6.9 (previous 7.2)  Will continue levemir  7 units;  novolog  5 units with meals   Will stop glipizide  5. Dysphagia: worse; she is on nectar this liquids; she is showing signs of aspiration; will have speech therapy see her and will setup a MBS.    PREVIOUS    6. [name]: will continue protonix 40 mg daily   7. Constipation: will  continue colace daily    8. Depression: will continue lexapro 20 mg nightly will monitor   9. Schizophrenia: is stable continue  zyprexa 5  mg nightly and will monitor   Is followed by psych services.  Will continue cogentin 1 mg nightly for tardive dyskinesia and will monitor  .   10. Macular degeneration: will contine   zaditor to both eyes twice daily   11. Allergic rhinitis: will continue flonase to both nares daily and zyrtec 10 mg daily   12. TIA: is presently stable; will continue asa 325 mg daily   13. Hypertension: b/p 108/64 will continue lisinopril 5 mg daily   Ok Edwards NP [facility]  Contact [phone] Monday through Friday 8am- 5pm  After hours call [phone]

## 2016-12-07 DIAGNOSIS — M6281 Muscle weakness (generalized): Secondary | ICD-10-CM | POA: Diagnosis not present

## 2016-12-07 DIAGNOSIS — F0391 Unspecified dementia with behavioral disturbance: Secondary | ICD-10-CM | POA: Diagnosis not present

## 2016-12-07 DIAGNOSIS — R1311 Dysphagia, oral phase: Secondary | ICD-10-CM | POA: Diagnosis not present

## 2016-12-11 DIAGNOSIS — M6281 Muscle weakness (generalized): Secondary | ICD-10-CM | POA: Diagnosis not present

## 2016-12-11 DIAGNOSIS — F0391 Unspecified dementia with behavioral disturbance: Secondary | ICD-10-CM | POA: Diagnosis not present

## 2016-12-11 DIAGNOSIS — R1311 Dysphagia, oral phase: Secondary | ICD-10-CM | POA: Diagnosis not present

## 2016-12-12 DIAGNOSIS — R1311 Dysphagia, oral phase: Secondary | ICD-10-CM | POA: Diagnosis not present

## 2016-12-12 DIAGNOSIS — F0391 Unspecified dementia with behavioral disturbance: Secondary | ICD-10-CM | POA: Diagnosis not present

## 2016-12-12 DIAGNOSIS — M6281 Muscle weakness (generalized): Secondary | ICD-10-CM | POA: Diagnosis not present

## 2016-12-13 DIAGNOSIS — M6281 Muscle weakness (generalized): Secondary | ICD-10-CM | POA: Diagnosis not present

## 2016-12-13 DIAGNOSIS — R1311 Dysphagia, oral phase: Secondary | ICD-10-CM | POA: Diagnosis not present

## 2016-12-13 DIAGNOSIS — F0391 Unspecified dementia with behavioral disturbance: Secondary | ICD-10-CM | POA: Diagnosis not present

## 2016-12-14 DIAGNOSIS — F0391 Unspecified dementia with behavioral disturbance: Secondary | ICD-10-CM | POA: Diagnosis not present

## 2016-12-14 DIAGNOSIS — M6281 Muscle weakness (generalized): Secondary | ICD-10-CM | POA: Diagnosis not present

## 2016-12-14 DIAGNOSIS — R1311 Dysphagia, oral phase: Secondary | ICD-10-CM | POA: Diagnosis not present

## 2016-12-17 DIAGNOSIS — M6281 Muscle weakness (generalized): Secondary | ICD-10-CM | POA: Diagnosis not present

## 2016-12-17 DIAGNOSIS — F0391 Unspecified dementia with behavioral disturbance: Secondary | ICD-10-CM | POA: Diagnosis not present

## 2016-12-17 DIAGNOSIS — R1311 Dysphagia, oral phase: Secondary | ICD-10-CM | POA: Diagnosis not present

## 2016-12-18 DIAGNOSIS — F0391 Unspecified dementia with behavioral disturbance: Secondary | ICD-10-CM | POA: Diagnosis not present

## 2016-12-18 DIAGNOSIS — M6281 Muscle weakness (generalized): Secondary | ICD-10-CM | POA: Diagnosis not present

## 2016-12-18 DIAGNOSIS — R1311 Dysphagia, oral phase: Secondary | ICD-10-CM | POA: Diagnosis not present

## 2016-12-19 ENCOUNTER — Other Ambulatory Visit (HOSPITAL_COMMUNITY): Payer: Self-pay | Admitting: Internal Medicine

## 2016-12-19 DIAGNOSIS — M6281 Muscle weakness (generalized): Secondary | ICD-10-CM | POA: Diagnosis not present

## 2016-12-19 DIAGNOSIS — R1310 Dysphagia, unspecified: Secondary | ICD-10-CM

## 2016-12-19 DIAGNOSIS — F0391 Unspecified dementia with behavioral disturbance: Secondary | ICD-10-CM | POA: Diagnosis not present

## 2016-12-19 DIAGNOSIS — R1311 Dysphagia, oral phase: Secondary | ICD-10-CM | POA: Diagnosis not present

## 2016-12-20 ENCOUNTER — Non-Acute Institutional Stay (SKILLED_NURSING_FACILITY): Payer: Medicare Other | Admitting: Adult Health

## 2016-12-20 ENCOUNTER — Encounter: Payer: Self-pay | Admitting: Adult Health

## 2016-12-20 DIAGNOSIS — F0391 Unspecified dementia with behavioral disturbance: Secondary | ICD-10-CM | POA: Diagnosis not present

## 2016-12-20 DIAGNOSIS — R1311 Dysphagia, oral phase: Secondary | ICD-10-CM | POA: Diagnosis not present

## 2016-12-20 DIAGNOSIS — M6281 Muscle weakness (generalized): Secondary | ICD-10-CM | POA: Diagnosis not present

## 2016-12-20 DIAGNOSIS — H6123 Impacted cerumen, bilateral: Secondary | ICD-10-CM

## 2016-12-20 DIAGNOSIS — J3089 Other allergic rhinitis: Secondary | ICD-10-CM | POA: Diagnosis not present

## 2016-12-20 NOTE — Progress Notes (Signed)
Location:   [place] Room Number: 225 B Place of Service:  SNF (31)   CODE STATUS: Full Code  Allergies  Allergen Reactions  . Ace Inhibitors   . Ether Nausea And Vomiting    Chief Complaint  Patient presents with  . Acute Visit    sore throat    HPI:  Staff reports that he has had a sore throat for the past couple of days. She tells me that she has not tried anything. There are no reports of fever present. She did tell me that her ears feel full. There are no changes in her appetite.    Past Medical History:  Diagnosis Date  . Allergic rhinitis   . CATARACT 06/06/2006   Qualifier: Diagnosis of  By: Genene Churn MD, Janett Billow    . Depressive disorder   . Diabetes mellitus type 2 in obese ([place])   . Essential hypertension, benign 02/24/2015  . Fungal dermatitis   . Hyperlipidemia   . Hypertension   . Macular degeneration   . Mild cognitive impairment   . Narcissism   . Normocytic anemia   . Phobia   . Schizophrenia, paranoid ([place])   . Stress incontinence, female     Past Surgical History:  Procedure Laterality Date  . ABDOMINAL HYSTERECTOMY     due to fibroids  . CATARACT EXTRACTION  6 16 2006  . CRYOTHERAPY  2 15 2006   facial AK  . hyperplastic   4 20 2006   AK shave biopsy  . intraocular injection  [id number]  . TOTAL ABDOMINAL HYSTERECTOMY W/ BILATERAL SALPINGOOPHORECTOMY     due to endometriosis  non malignant    Social History   Social History  . Marital status: Divorced    Spouse name: N/A  . Number of children: N/A  . Years of education: N/A   Occupational History  . Not on file.   Social History Main Topics  . Smoking status: Former Research scientist (life sciences)  . Smokeless tobacco: Former Systems developer    Types: Chew  . Alcohol use No  . Drug use: No  . Sexual activity: Not on file   Other Topics Concern  . Not on file   Social History Narrative   Lives in [facility] (assisted living), remotely smoked and chewed tobacco. Daughter([name])  drives  her, independent of all ADL's   Family History  Problem Relation Age of Onset  . Heart disease Mother   . Heart disease Father   . Stroke Father   . Cancer Cousin        liver and colon  . Stroke Sister       VITAL SIGNS BP 140/80   Pulse 62   Temp (!) 97.4 F (36.3 C)   Resp 17   Ht 4\' 9"  (1.448 m)   Wt 176 lb 12.8 oz (80.2 kg)   SpO2 97%   BMI 38.26 kg/m   Patient's Medications  New Prescriptions   No medications on file  Previous Medications   ACETAMINOPHEN (TYLENOL) 325 MG TABLET    Take 650 mg by mouth every 6 (six) hours as needed.    AMINO ACIDS-PROTEIN HYDROLYS PO    Take 30 mLs by mouth 2 (two) times daily.   ASPIRIN EC 325 MG TABLET    Take 1 tablet (325 mg total) by mouth daily.   ATORVASTATIN (LIPITOR) 20 MG TABLET    Take 1 tablet (20 mg total) by mouth at bedtime.   BENZTROPINE (COGENTIN) 1 MG  TABLET    Take 1 mg by mouth at bedtime.   CALCIUM CARBONATE-VITAMIN D (CALCIUM 600-D) 600-400 MG-UNIT PER TABLET    Take 1 tablet by mouth at bedtime.    CETIRIZINE (ZYRTEC) 10 MG TABLET    Take 5 mg by mouth at bedtime.    CHOLECALCIFEROL (VITAMIN D) 1000 UNITS TABLET    Take 1,000 Units by mouth daily.   CYANOCOBALAMIN 1000 MCG TABLET    Take 1,000 mcg by mouth at bedtime.    DOCUSATE SODIUM (COLACE) 100 MG CAPSULE    Take 1 capsule (100 mg total) by mouth daily. For constipation   DONEPEZIL (ARICEPT) 10 MG TABLET    Take 10 mg by mouth at bedtime.   ESCITALOPRAM (LEXAPRO) 20 MG TABLET    Take 20 mg by mouth daily.   FLUTICASONE (FLONASE) 50 MCG/ACT NASAL SPRAY    Place 1 spray into both nostrils daily.   HYPROMELLOSE 0.4 % SOLN    Place 1 drop into both eyes 3 (three) times daily.   INSULIN ASPART (NOVOLOG) 100 UNIT/ML INJECTION    Inject 5 Units into the skin 3 (three) times daily before meals. Notify MD if BS is less than 70 or greater than 450. Hold Insulin if BS is less than 70   INSULIN DETEMIR (LEVEMIR FLEXPEN) 100 UNIT/ML PEN    Inject 7 Units into the skin  at bedtime.    LOSARTAN (COZAAR) 25 MG TABLET    Take 25 mg by mouth daily.   MEMANTINE HCL ER (NAMENDA XR) 28 MG CP24    Take 28 mg by mouth daily. For dementia   MULTIPLE VITAMINS-MINERALS (DECUBI-VITE) CAPS    Take 1 capsule by mouth daily.   NUTRITIONAL SUPPLEMENTS (NUTRITIONAL SUPPLEMENT PO)    Take by mouth. HSG Puree texture, Nectar consistency.  May have Bananas, soft cookies and soft centered sandwiches with distant supervision.  Large portions at meals   OLANZAPINE (ZYPREXA) 5 MG TABLET    Take 5 mg by mouth at bedtime.   PANTOPRAZOLE (PROTONIX) 40 MG TABLET    Take 40 mg by mouth daily.   TRIAMCINOLONE (KENALOG) 0.025 % OINTMENT    Apply 1 application topically daily. Apply to arms for skin integrity  Modified Medications   No medications on file  Discontinued Medications   GLIPIZIDE (GLUCOTROL) 5 MG TABLET    Take 5 mg by mouth 2 (two) times daily.   GUAIFENESIN (MUCINEX) 600 MG 12 HR TABLET    Take 600 mg by mouth 2 (two) times daily as needed.      SIGNIFICANT DIAGNOSTIC EXAMS  TODAY  12-30-14: right eye intraocular injection   02-06-16: ct of head: Atrophy and small vessel disease, similar to priors. No acute intracranial findings.  02-06-16: mri of brain: Atrophy and small vessel disease, similar to priors. No acute stroke is evident.  02-06-16: ct angio of head and neck: CT HEADNo intracranial hemorrhage or CT evidence of large acute infarct. Chronic microvascular changes. No intracranial mass abnormal enhancement. Global atrophy without hydrocephalus.  CTA NECK Plaque right carotid bifurcation and proximal right internal carotid artery with less than 50% diameter narrowing. Mild plaque proximal left internal carotid artery with less than 50% diameter narrowing. Plaque with mild narrowing origin of vertebral arteries bilaterally. Left vertebral artery slightly dominant size. CTA HEAD Plaque with mild narrowing cavernous segment internal carotid artery bilaterally.  Anterior circulation without medium or large size vessel significant stenosis or occlusion. Mild narrowing distal right vertebral artery. No  significant stenosis left vertebral artery or basilar artery.    03-12-16: EEG: This is a mildly abnormal EEG due to the presence of  moderate generalized slowing which is indicative of bihemispheric dysfunction which is a nonspecific finding seen in a variety of degenerative, hypoxic, ischemic, toxic, metabolic etiologies. No definite epileptiform activity is noted  12-03-16: chest x-ray: no acute cardiopulmonary disease  NO NEW EXAMS  LABS REVIEWED: PREVIOUS  12-09-15: glucose 256; bun 14.6; creat 1.01; k+ 4.5; na++ 137; liver normal albumin 3.5; chol 156; ldl 61; trig 246; hdl 45  hgb a1c 7.6  02-06-16: wbc 5.6; hgb 13.0; hct 40.3; mcv 86.1; plt 149; glucose 239; bun 8; creat 0.84; k+ 4.0; na++ 132; liver normal albumin 3.1; blood culture: no growth; UA: neg 02-15-16: pre-albumin 10  03-14-16: urine micro-albumin <1.2  03-27-16: glucose 126; bun 11.7; creat 0.98; k+ 4.5; na++ 143; hgb a1c 7.8  07-02-16: wbc 8.8; hgb 12.5; hct 37.9 ;mcv 85.6; plt 219; glucose 83; bun 22.5; creat 1.12; k+ 4.8; na++ 140; ca 0.1; liver  Normal albumin 3.4  hgb a1c 7.4 chol 148; ldl 75; trig 165; hdl 40  07-25-16: wbc 7.8; hgb 13.0; hct 39.9; mcv 84.9;  glucose 203; bun 15.5; creat 0.73; k+ 4.4 na++ 138; ca 9.1; hgb a1c 7.2  07-26-16: chol 140; ldl 92; trig 142; hdl 43  11-02-16: glucose 198; bun 17.0; creat 0.89; k+ 4.7; na++ 137; liver normal albumin 4.0 hgb a1c 6.9; chol 162; ldl 85; trig 157; hdl 45   NO NEW LABS   Review of Systems  Constitutional: Negative for fever and malaise/fatigue.  HENT: Positive for congestion and sore throat.        Ears feel full.   Eyes: Negative for pain.  Respiratory: Negative for cough and shortness of breath.   Cardiovascular: Negative for chest pain, palpitations and leg swelling.  Gastrointestinal: Negative for abdominal pain,  constipation and heartburn.  Musculoskeletal: Negative for back pain, joint pain and myalgias.  Skin: Negative.   Neurological: Negative for dizziness.  Psychiatric/Behavioral: The patient is not nervous/anxious.     Physical Exam  Constitutional: No distress.  HENT:  Head: Normocephalic.  Mouth/Throat: No oropharyngeal exudate.  cerumen present in bilateral ears R>L  Eyes: Conjunctivae are normal.  Neck: Neck supple. No thyromegaly present.  Cardiovascular: Normal rate, regular rhythm and normal heart sounds.   Pulmonary/Chest: Effort normal and breath sounds normal. No respiratory distress.  Abdominal: Soft. Bowel sounds are normal. She exhibits no distension. There is no tenderness.  Musculoskeletal: She exhibits no edema.  Is able to move all extremities   Lymphadenopathy:    She has no cervical adenopathy.  Neurological: She is alert.  Skin: Skin is warm and dry. She is not diaphoretic.  Psychiatric: She has a normal mood and affect.  Vitals reviewed.  ASSESSMENT/ PLAN:   TODAY:   1. Allergic rhinitis: worse: will change zyrtec to claritin 10 mg daily will use saline nasal spray twice daily for 3 days;   2. Cerumen impaction: will use debrox ear drops for 3 days then irrigate     Ok Edwards NP [facility]  Contact [phone] Monday through Friday 8am- 5pm  After hours call [phone]

## 2016-12-21 DIAGNOSIS — F0391 Unspecified dementia with behavioral disturbance: Secondary | ICD-10-CM | POA: Diagnosis not present

## 2016-12-21 DIAGNOSIS — R1311 Dysphagia, oral phase: Secondary | ICD-10-CM | POA: Diagnosis not present

## 2016-12-21 DIAGNOSIS — M6281 Muscle weakness (generalized): Secondary | ICD-10-CM | POA: Diagnosis not present

## 2016-12-24 DIAGNOSIS — F0391 Unspecified dementia with behavioral disturbance: Secondary | ICD-10-CM | POA: Diagnosis not present

## 2016-12-24 DIAGNOSIS — R1311 Dysphagia, oral phase: Secondary | ICD-10-CM | POA: Diagnosis not present

## 2016-12-24 DIAGNOSIS — M6281 Muscle weakness (generalized): Secondary | ICD-10-CM | POA: Diagnosis not present

## 2016-12-25 ENCOUNTER — Ambulatory Visit (HOSPITAL_COMMUNITY)
Admission: RE | Admit: 2016-12-25 | Discharge: 2016-12-25 | Disposition: A | Discharge status: Home / Self Care | Payer: No Typology Code available for payment source | Source: Ambulatory Visit | Attending: Internal Medicine | Admitting: Internal Medicine

## 2016-12-25 ENCOUNTER — Ambulatory Visit (HOSPITAL_COMMUNITY)
Admission: RE | Admit: 2016-12-25 | Discharge: 2016-12-25 | Disposition: A | Discharge status: Home / Self Care | Payer: Medicare Other | Source: Ambulatory Visit | Attending: Internal Medicine | Admitting: Internal Medicine

## 2016-12-25 DIAGNOSIS — R1311 Dysphagia, oral phase: Secondary | ICD-10-CM | POA: Diagnosis not present

## 2016-12-25 DIAGNOSIS — E119 Type 2 diabetes mellitus without complications: Secondary | ICD-10-CM | POA: Insufficient documentation

## 2016-12-25 DIAGNOSIS — F0391 Unspecified dementia with behavioral disturbance: Secondary | ICD-10-CM | POA: Diagnosis not present

## 2016-12-25 DIAGNOSIS — R1310 Dysphagia, unspecified: Secondary | ICD-10-CM | POA: Diagnosis not present

## 2016-12-25 DIAGNOSIS — I10 Essential (primary) hypertension: Secondary | ICD-10-CM | POA: Diagnosis not present

## 2016-12-25 DIAGNOSIS — F329 Major depressive disorder, single episode, unspecified: Secondary | ICD-10-CM | POA: Insufficient documentation

## 2016-12-25 DIAGNOSIS — F200 Paranoid schizophrenia: Secondary | ICD-10-CM | POA: Diagnosis not present

## 2016-12-25 DIAGNOSIS — R1312 Dysphagia, oropharyngeal phase: Secondary | ICD-10-CM | POA: Diagnosis not present

## 2016-12-25 DIAGNOSIS — E785 Hyperlipidemia, unspecified: Secondary | ICD-10-CM | POA: Insufficient documentation

## 2016-12-25 DIAGNOSIS — M6281 Muscle weakness (generalized): Secondary | ICD-10-CM | POA: Diagnosis not present

## 2016-12-25 NOTE — Progress Notes (Signed)
Note filed on wrong patient in error

## 2016-12-25 NOTE — Progress Notes (Signed)
Modified Barium Swallow Progress Note  Patient Details  Name: Debra Tran MRN: [id number] Date of Birth: [birth year]  Today's Date: 12/25/2016  Modified Barium Swallow completed.  Full report located under Chart Review in the Imaging Section.  Brief recommendations include the following:  Clinical Impression  Pt exhibited mild oral dysphagia marked by delayed mastication and oral transit with regular texture. Transient swallow trigger at the vallecula due to mildly decreased sensation however not impacting safety of swallow during this study. Flash laryngeal penetration and one instance where trace-minimal amount seen on anterior wall of laryngeal vestibule. No significant pharyngeal residue post swallow. Recommend pt continue Dys 1 (puree) texture, nectar thick liquids, full supervision, no straws, crush pills.   Swallow Evaluation Recommendations       SLP Diet Recommendations: Dysphagia 1 (Puree) solids;Nectar thick liquid   Liquid Administration via: Cup;No straw   Medication Administration: Crushed with puree   Supervision: Patient able to self feed;Full supervision/cueing for compensatory strategies   Compensations: Slow rate;Small sips/bites;Minimize environmental distractions   Postural Changes: Seated upright at 90 degrees   Oral Care Recommendations: Oral care BID        Debra Tran 12/25/2016,2:39 PM  Debra Tran.Ed [employer] [phone]

## 2016-12-26 ENCOUNTER — Non-Acute Institutional Stay (SKILLED_NURSING_FACILITY): Payer: Medicare Other | Admitting: Adult Health

## 2016-12-26 ENCOUNTER — Encounter: Payer: Self-pay | Admitting: Adult Health

## 2016-12-26 DIAGNOSIS — R0982 Postnasal drip: Secondary | ICD-10-CM | POA: Diagnosis not present

## 2016-12-26 DIAGNOSIS — F209 Schizophrenia, unspecified: Secondary | ICD-10-CM

## 2016-12-26 DIAGNOSIS — R1312 Dysphagia, oropharyngeal phase: Secondary | ICD-10-CM

## 2016-12-26 DIAGNOSIS — F0391 Unspecified dementia with behavioral disturbance: Secondary | ICD-10-CM | POA: Diagnosis not present

## 2016-12-26 DIAGNOSIS — M6281 Muscle weakness (generalized): Secondary | ICD-10-CM | POA: Diagnosis not present

## 2016-12-26 DIAGNOSIS — R1311 Dysphagia, oral phase: Secondary | ICD-10-CM | POA: Diagnosis not present

## 2016-12-26 DIAGNOSIS — F0150 Vascular dementia without behavioral disturbance: Secondary | ICD-10-CM

## 2016-12-26 NOTE — Progress Notes (Signed)
Location:   [place] Room Number: 225 B Place of Service:  SNF (31)   CODE STATUS: Full Code  Allergies  Allergen Reactions  . Ace Inhibitors   . Ether Nausea And Vomiting    Chief Complaint  Patient presents with  . Acute Visit    Care Plan Meeting    HPI:  We have come together for her quarterly care plan meeting with her daughter. Her care plan has been reviewed. Her daughter did discuss numerous nursing concerns which have been addressed. She is concerned about her speech delay that she has periodically. Her family is concerned about possible TIA; she is concerned about Letta sleeping during the day. She is also concerned about her mother continuing to have a cough especially at night. She is on nectar thick liquids. Speech therapy is seeing her and does not think the coughing is a sign of aspiration. She does have post nasal drip.  We did discuss that as her dementia progresses she will have issues with delay speech; which is an outcome of her dementia. She is sleeping more during the day; due to her age and her chronic illnesses. Her weight has remained stable . Her daughter did verbalize understanding.  There are no nursing concerns at this time.   Past Medical History:  Diagnosis Date  . Allergic rhinitis   . CATARACT 06/06/2006   Qualifier: Diagnosis of  By: Genene Churn MD, Janett Billow    . Depressive disorder   . Diabetes mellitus type 2 in obese ([place])   . Essential hypertension, benign 02/24/2015  . Fungal dermatitis   . Hyperlipidemia   . Hypertension   . Macular degeneration   . Mild cognitive impairment   . Narcissism   . Normocytic anemia   . Phobia   . Schizophrenia, paranoid ([place])   . Stress incontinence, female     Past Surgical History:  Procedure Laterality Date  . ABDOMINAL HYSTERECTOMY     due to fibroids  . CATARACT EXTRACTION  6 16 2006  . CRYOTHERAPY  2 15 2006   facial AK  . hyperplastic   4 20 2006   AK shave biopsy  . intraocular  injection  [id number]  . TOTAL ABDOMINAL HYSTERECTOMY W/ BILATERAL SALPINGOOPHORECTOMY     due to endometriosis  non malignant    Social History   Social History  . Marital status: Divorced    Spouse name: N/A  . Number of children: N/A  . Years of education: N/A   Occupational History  . Not on file.   Social History Main Topics  . Smoking status: Former Research scientist (life sciences)  . Smokeless tobacco: Former Systems developer    Types: Chew  . Alcohol use No  . Drug use: No  . Sexual activity: Not on file   Other Topics Concern  . Not on file   Social History Narrative   Lives in [facility] (assisted living), remotely smoked and chewed tobacco. Daughter([name])  drives her, independent of all ADL's   Family History  Problem Relation Age of Onset  . Heart disease Mother   . Heart disease Father   . Stroke Father   . Cancer Cousin        liver and colon  . Stroke Sister       VITAL SIGNS BP 140/80   Pulse 62   Temp (!) 97.4 F (36.3 C)   Resp 17   Ht 4\' 9"  (1.448 m)   Wt 180 lb 3.2 oz (81.7  kg)   SpO2 97%   BMI 38.99 kg/m   Patient's Medications  New Prescriptions   No medications on file  Previous Medications   ACETAMINOPHEN (TYLENOL) 325 MG TABLET    Take 650 mg by mouth every 6 (six) hours as needed.    AMINO ACIDS-PROTEIN HYDROLYS PO    Take 30 mLs by mouth 2 (two) times daily.   ASPIRIN EC 325 MG TABLET    Take 1 tablet (325 mg total) by mouth daily.   ATORVASTATIN (LIPITOR) 20 MG TABLET    Take 1 tablet (20 mg total) by mouth at bedtime.   BENZTROPINE (COGENTIN) 1 MG TABLET    Take 1 mg by mouth at bedtime.   CALCIUM CARBONATE-VITAMIN D (CALCIUM 600-D) 600-400 MG-UNIT PER TABLET    Take 1 tablet by mouth at bedtime.    CHOLECALCIFEROL (VITAMIN D) 1000 UNITS TABLET    Take 1,000 Units by mouth daily.   CYANOCOBALAMIN 1000 MCG TABLET    Take 1,000 mcg by mouth at bedtime.    DOCUSATE SODIUM (COLACE) 100 MG CAPSULE    Take 1 capsule (100 mg total) by mouth daily. For  constipation   DONEPEZIL (ARICEPT) 10 MG TABLET    Take 10 mg by mouth at bedtime.   ESCITALOPRAM (LEXAPRO) 20 MG TABLET    Take 20 mg by mouth daily.   FLUTICASONE (FLONASE) 50 MCG/ACT NASAL SPRAY    Place 1 spray into both nostrils daily.   HYPROMELLOSE 0.4 % SOLN    Place 1 drop into both eyes 3 (three) times daily.   INSULIN ASPART (NOVOLOG) 100 UNIT/ML INJECTION    Inject 5 Units into the skin 3 (three) times daily before meals. Notify MD if BS is less than 70 or greater than 450. Hold Insulin if BS is less than 70   INSULIN DETEMIR (LEVEMIR FLEXPEN) 100 UNIT/ML PEN    Inject 7 Units into the skin at bedtime.    LORATADINE (CLARITIN) 10 MG TABLET    Take 10 mg by mouth daily.   LOSARTAN (COZAAR) 25 MG TABLET    Take 25 mg by mouth daily.   MEMANTINE HCL ER (NAMENDA XR) 28 MG CP24    Take 28 mg by mouth daily. For dementia   MULTIPLE VITAMINS-MINERALS (DECUBI-VITE) CAPS    Take 1 capsule by mouth daily.   NUTRITIONAL SUPPLEMENTS (NUTRITIONAL SUPPLEMENT PO)    Take by mouth. HSG Puree texture, Nectar consistency.  May have Bananas, soft cookies and soft centered sandwiches with distant supervision.  Large portions at meals   OLANZAPINE (ZYPREXA) 5 MG TABLET    Take 5 mg by mouth at bedtime.   PANTOPRAZOLE (PROTONIX) 40 MG TABLET    Take 40 mg by mouth daily.   TRIAMCINOLONE (KENALOG) 0.025 % OINTMENT    Apply 1 application topically daily. Apply to arms for skin integrity  Modified Medications   No medications on file  Discontinued Medications   CETIRIZINE (ZYRTEC) 10 MG TABLET    Take 5 mg by mouth at bedtime.      SIGNIFICANT DIAGNOSTIC EXAMS  PREVIOUS  12-30-14: right eye intraocular injection   02-06-16: ct of head: Atrophy and small vessel disease, similar to priors. No acute intracranial findings.  02-06-16: mri of brain: Atrophy and small vessel disease, similar to priors. No acute stroke is evident.  02-06-16: ct angio of head and neck: CT HEADNo intracranial hemorrhage or  CT evidence of large acute infarct. Chronic microvascular changes. No intracranial mass  abnormal enhancement. Global atrophy without hydrocephalus.  CTA NECK Plaque right carotid bifurcation and proximal right internal carotid artery with less than 50% diameter narrowing. Mild plaque proximal left internal carotid artery with less than 50% diameter narrowing. Plaque with mild narrowing origin of vertebral arteries bilaterally. Left vertebral artery slightly dominant size. CTA HEAD Plaque with mild narrowing cavernous segment internal carotid artery bilaterally. Anterior circulation without medium or large size vessel significant stenosis or occlusion. Mild narrowing distal right vertebral artery. No significant stenosis left vertebral artery or basilar artery.    03-12-16: EEG: This is a mildly abnormal EEG due to the presence of  moderate generalized slowing which is indicative of bihemispheric dysfunction which is a nonspecific finding seen in a variety of degenerative, hypoxic, ischemic, toxic, metabolic etiologies. No definite epileptiform activity is noted  12-03-16: chest x-ray: no acute cardiopulmonary disease  NO NEW EXAMS  LABS REVIEWED: PREVIOUS  12-09-15: glucose 256; bun 14.6; creat 1.01; k+ 4.5; na++ 137; liver normal albumin 3.5; chol 156; ldl 61; trig 246; hdl 45  hgb a1c 7.6  02-06-16: wbc 5.6; hgb 13.0; hct 40.3; mcv 86.1; plt 149; glucose 239; bun 8; creat 0.84; k+ 4.0; na++ 132; liver normal albumin 3.1; blood culture: no growth; UA: neg 02-15-16: pre-albumin 10  03-14-16: urine micro-albumin <1.2  03-27-16: glucose 126; bun 11.7; creat 0.98; k+ 4.5; na++ 143; hgb a1c 7.8  07-02-16: wbc 8.8; hgb 12.5; hct 37.9 ;mcv 85.6; plt 219; glucose 83; bun 22.5; creat 1.12; k+ 4.8; na++ 140; ca 0.1; liver  Normal albumin 3.4  hgb a1c 7.4 chol 148; ldl 75; trig 165; hdl 40  07-25-16: wbc 7.8; hgb 13.0; hct 39.9; mcv 84.9;  glucose 203; bun 15.5; creat 0.73; k+ 4.4 na++ 138; ca 9.1; hgb a1c  7.2  07-26-16: chol 140; ldl 92; trig 142; hdl 43  11-02-16: glucose 198; bun 17.0; creat 0.89; k+ 4.7; na++ 137; liver normal albumin 4.0 hgb a1c 6.9; chol 162; ldl 85; trig 157; hdl 45   NO NEW LABS   Review of Systems  Constitutional: Negative for malaise/fatigue.  Respiratory: Negative for cough and shortness of breath.   Cardiovascular: Negative for chest pain, palpitations and leg swelling.  Gastrointestinal: Negative for abdominal pain, constipation and heartburn.  Genitourinary: Negative for dysuria.  Musculoskeletal: Negative for back pain, joint pain and myalgias.  Skin: Negative.   Neurological: Negative for dizziness.  Psychiatric/Behavioral: The patient is not nervous/anxious.     Physical Exam  Constitutional: She appears well-developed and well-nourished. No distress.  HENT:  Right Ear: External ear normal.  Left Ear: External ear normal.  Nose: Nose normal.  Mouth/Throat: No oropharyngeal exudate.  Eyes: Conjunctivae are normal.  Neck: Neck supple. No thyromegaly present.  Cardiovascular: Normal rate, regular rhythm, normal heart sounds and intact distal pulses.   Pulmonary/Chest: Effort normal and breath sounds normal. No respiratory distress.  Abdominal: Soft. Bowel sounds are normal. She exhibits no distension. There is no tenderness.  Musculoskeletal: She exhibits no edema.  Is able to move all extremities   Lymphadenopathy:    She has no cervical adenopathy.  Neurological: She is alert.  Skin: Skin is warm and dry. She is not diaphoretic.  Psychiatric: She has a normal mood and affect.    ASSESSMENT/ PLAN:  TODAY:   1. Dysphagia 2. Dementia 3. Schizophrenia 4. Post nasal drip   Will continue her current plan of care Will begin atrovent 0.03% nasal spray before meals and nightly for her cough and  congestion  Time spent with family and resident >40 minutes: to include discussion of:   Her speech delay; sleeping during the day; post nasal drip and  her overall status; she did verbalize understanding.      MD is aware of resident's narcotic use and is in agreement with current plan of care. We will attempt to wean resident as apropriate   Ok Edwards NP [facility]  Contact [phone] Monday through Friday 8am- 5pm  After hours call [phone]

## 2016-12-27 DIAGNOSIS — R1311 Dysphagia, oral phase: Secondary | ICD-10-CM | POA: Diagnosis not present

## 2016-12-27 DIAGNOSIS — F0391 Unspecified dementia with behavioral disturbance: Secondary | ICD-10-CM | POA: Diagnosis not present

## 2016-12-27 DIAGNOSIS — M6281 Muscle weakness (generalized): Secondary | ICD-10-CM | POA: Diagnosis not present

## 2016-12-28 DIAGNOSIS — M6281 Muscle weakness (generalized): Secondary | ICD-10-CM | POA: Diagnosis not present

## 2016-12-28 DIAGNOSIS — F0391 Unspecified dementia with behavioral disturbance: Secondary | ICD-10-CM | POA: Diagnosis not present

## 2016-12-28 DIAGNOSIS — R1311 Dysphagia, oral phase: Secondary | ICD-10-CM | POA: Diagnosis not present

## 2016-12-31 ENCOUNTER — Encounter: Payer: Self-pay | Admitting: Adult Health

## 2016-12-31 ENCOUNTER — Non-Acute Institutional Stay (SKILLED_NURSING_FACILITY): Payer: Medicare Other | Admitting: Adult Health

## 2016-12-31 DIAGNOSIS — R1311 Dysphagia, oral phase: Secondary | ICD-10-CM | POA: Diagnosis not present

## 2016-12-31 DIAGNOSIS — Z794 Long term (current) use of insulin: Secondary | ICD-10-CM | POA: Diagnosis not present

## 2016-12-31 DIAGNOSIS — E119 Type 2 diabetes mellitus without complications: Secondary | ICD-10-CM | POA: Diagnosis not present

## 2016-12-31 DIAGNOSIS — M6281 Muscle weakness (generalized): Secondary | ICD-10-CM | POA: Diagnosis not present

## 2016-12-31 DIAGNOSIS — F0391 Unspecified dementia with behavioral disturbance: Secondary | ICD-10-CM | POA: Diagnosis not present

## 2016-12-31 NOTE — Progress Notes (Signed)
Location:   [place] Room Number: 225 B Place of Service:  SNF (31)   CODE STATUS: Full Code  Allergies  Allergen Reactions  . Ace Inhibitors   . Ether Nausea And Vomiting    Chief Complaint  Patient presents with  . Acute     DM Follow up    HPI:  She is being seen for the management of her diabetes. Her cbgs are all elevated. There are no reports of hypoglycemia. There are no report of excessive thirst or polyuria. She denies headaches; changes in appetite. There are no reports of behavioral issues. There are no nursing concerns at this time.    Past Medical History:  Diagnosis Date  . Allergic rhinitis   . CATARACT 06/06/2006   Qualifier: Diagnosis of  By: Genene Churn MD, Janett Billow    . Depressive disorder   . Diabetes mellitus type 2 in obese ([place])   . Essential hypertension, benign 02/24/2015  . Fungal dermatitis   . Hyperlipidemia   . Macular degeneration   . Mild cognitive impairment   . Narcissism   . Normocytic anemia   . Phobia   . Schizophrenia, paranoid ([place])   . Stress incontinence, female     Past Surgical History:  Procedure Laterality Date  . ABDOMINAL HYSTERECTOMY     due to fibroids  . CATARACT EXTRACTION  6 16 2006  . CRYOTHERAPY  2 15 2006   facial AK  . hyperplastic   4 20 2006   AK shave biopsy  . intraocular injection  [id number]  . TOTAL ABDOMINAL HYSTERECTOMY W/ BILATERAL SALPINGOOPHORECTOMY     due to endometriosis  non malignant    Social History   Social History  . Marital status: Divorced    Spouse name: N/A  . Number of children: N/A  . Years of education: N/A   Occupational History  . Not on file.   Social History Main Topics  . Smoking status: Former Research scientist (life sciences)  . Smokeless tobacco: Former Systems developer    Types: Chew  . Alcohol use No  . Drug use: No  . Sexual activity: Not on file   Other Topics Concern  . Not on file   Social History Narrative   Lives in [facility] (assisted living), remotely smoked and  chewed tobacco. Daughter([name])  drives her, independent of all ADL's   Family History  Problem Relation Age of Onset  . Heart disease Mother   . Heart disease Father   . Stroke Father   . Cancer Cousin        liver and colon  . Stroke Sister       VITAL SIGNS BP (!) 142/88   Pulse 80   Temp 97.6 F (36.4 C)   Resp 20   Ht 4\' 9"  (1.448 m)   Wt 180 lb 3.2 oz (81.7 kg)   SpO2 95%   BMI 38.99 kg/m   Patient's Medications  New Prescriptions   No medications on file  Previous Medications   ACETAMINOPHEN (TYLENOL) 325 MG TABLET    Take 650 mg by mouth every 6 (six) hours as needed.    AMINO ACIDS-PROTEIN HYDROLYS PO    Take 30 mLs by mouth 2 (two) times daily.   ASPIRIN EC 325 MG TABLET    Take 1 tablet (325 mg total) by mouth daily.   ATORVASTATIN (LIPITOR) 20 MG TABLET    Take 1 tablet (20 mg total) by mouth at bedtime.   BENZTROPINE (COGENTIN) 1  MG TABLET    Take 1 mg by mouth at bedtime.   CALCIUM CARBONATE-VITAMIN D (CALCIUM 600-D) 600-400 MG-UNIT PER TABLET    Take 1 tablet by mouth at bedtime.    CHOLECALCIFEROL (VITAMIN D) 1000 UNITS TABLET    Take 1,000 Units by mouth daily.   CYANOCOBALAMIN 1000 MCG TABLET    Take 1,000 mcg by mouth at bedtime.    DOCUSATE SODIUM (COLACE) 100 MG CAPSULE    Take 1 capsule (100 mg total) by mouth daily. For constipation   DONEPEZIL (ARICEPT) 10 MG TABLET    Take 10 mg by mouth at bedtime.   ESCITALOPRAM (LEXAPRO) 20 MG TABLET    Take 20 mg by mouth daily.   FLUTICASONE (FLONASE) 50 MCG/ACT NASAL SPRAY    Place 1 spray into both nostrils daily.   HYPROMELLOSE 0.4 % SOLN    Place 1 drop into both eyes 3 (three) times daily.   INSULIN ASPART (NOVOLOG) 100 UNIT/ML INJECTION    Inject 5 Units into the skin 3 (three) times daily before meals. Notify MD if BS is less than 70 or greater than 450. Hold Insulin if BS is less than 70   INSULIN DETEMIR (LEVEMIR FLEXPEN) 100 UNIT/ML PEN    Inject 7 Units into the skin at bedtime.    LORATADINE  (CLARITIN) 10 MG TABLET    Take 10 mg by mouth daily.   LOSARTAN (COZAAR) 25 MG TABLET    Take 25 mg by mouth daily.   MEMANTINE HCL ER (NAMENDA XR) 28 MG CP24    Take 28 mg by mouth daily. For dementia   MULTIPLE VITAMINS-MINERALS (DECUBI-VITE) CAPS    Take 1 capsule by mouth daily.   NUTRITIONAL SUPPLEMENTS (NUTRITIONAL SUPPLEMENT PO)    Take by mouth. HSG Puree texture, Nectar consistency.  May have Bananas, soft cookies and soft centered sandwiches with distant supervision.  Large portions at meals   OLANZAPINE (ZYPREXA) 5 MG TABLET    Take 5 mg by mouth at bedtime.   PANTOPRAZOLE (PROTONIX) 40 MG TABLET    Take 40 mg by mouth daily.   TRIAMCINOLONE (KENALOG) 0.025 % OINTMENT    Apply 1 application topically daily. Apply to arms for skin integrity  Modified Medications   No medications on file  Discontinued Medications   No medications on file     SIGNIFICANT DIAGNOSTIC EXAMS  PREVIOUS  12-30-14: right eye intraocular injection   02-06-16: ct of head: Atrophy and small vessel disease, similar to priors. No acute intracranial findings.  02-06-16: mri of brain: Atrophy and small vessel disease, similar to priors. No acute stroke is evident.  02-06-16: ct angio of head and neck: CT HEADNo intracranial hemorrhage or CT evidence of large acute infarct. Chronic microvascular changes. No intracranial mass abnormal enhancement. Global atrophy without hydrocephalus.  CTA NECK Plaque right carotid bifurcation and proximal right internal carotid artery with less than 50% diameter narrowing. Mild plaque proximal left internal carotid artery with less than 50% diameter narrowing. Plaque with mild narrowing origin of vertebral arteries bilaterally. Left vertebral artery slightly dominant size. CTA HEAD Plaque with mild narrowing cavernous segment internal carotid artery bilaterally. Anterior circulation without medium or large size vessel significant stenosis or occlusion. Mild narrowing  distal right vertebral artery. No significant stenosis left vertebral artery or basilar artery.    03-12-16: EEG: This is a mildly abnormal EEG due to the presence of  moderate generalized slowing which is indicative of bihemispheric dysfunction which is a nonspecific  finding seen in a variety of degenerative, hypoxic, ischemic, toxic, metabolic etiologies. No definite epileptiform activity is noted  12-03-16: chest x-ray: no acute cardiopulmonary disease  NO NEW EXAMS  LABS REVIEWED: PREVIOUS  12-09-15: glucose 256; bun 14.6; creat 1.01; k+ 4.5; na++ 137; liver normal albumin 3.5; chol 156; ldl 61; trig 246; hdl 45  hgb a1c 7.6  02-06-16: wbc 5.6; hgb 13.0; hct 40.3; mcv 86.1; plt 149; glucose 239; bun 8; creat 0.84; k+ 4.0; na++ 132; liver normal albumin 3.1; blood culture: no growth; UA: neg 02-15-16: pre-albumin 10  03-14-16: urine micro-albumin <1.2  03-27-16: glucose 126; bun 11.7; creat 0.98; k+ 4.5; na++ 143; hgb a1c 7.8  07-02-16: wbc 8.8; hgb 12.5; hct 37.9 ;mcv 85.6; plt 219; glucose 83; bun 22.5; creat 1.12; k+ 4.8; na++ 140; ca 0.1; liver  Normal albumin 3.4  hgb a1c 7.4 chol 148; ldl 75; trig 165; hdl 40  07-25-16: wbc 7.8; hgb 13.0; hct 39.9; mcv 84.9;  glucose 203; bun 15.5; creat 0.73; k+ 4.4 na++ 138; ca 9.1; hgb a1c 7.2  07-26-16: chol 140; ldl 92; trig 142; hdl 43  11-02-16: glucose 198; bun 17.0; creat 0.89; k+ 4.7; na++ 137; liver normal albumin 4.0 hgb a1c 6.9; chol 162; ldl 85; trig 157; hdl 45   NO NEW LABS    Review of Systems  Constitutional: Negative for malaise/fatigue.  Respiratory: Negative for cough and shortness of breath.   Cardiovascular: Negative for chest pain, palpitations and leg swelling.  Gastrointestinal: Negative for abdominal pain, constipation and heartburn.  Musculoskeletal: Negative for back pain, joint pain and myalgias.  Skin: Negative.   Neurological: Negative for dizziness.  Psychiatric/Behavioral: The patient is not nervous/anxious.      Physical Exam  Constitutional: She appears well-developed and well-nourished. No distress.  Neck: Neck supple. No thyromegaly present.  Cardiovascular: Normal rate, regular rhythm, normal heart sounds and intact distal pulses.   Pulmonary/Chest: Effort normal and breath sounds normal. No respiratory distress.  Abdominal: Soft. Bowel sounds are normal. She exhibits no distension. There is no tenderness.  Musculoskeletal: She exhibits no edema.  Is able to move all extremities   Neurological: She is alert.  Skin: Skin is warm and dry. She is not diaphoretic.  Psychiatric: She has a normal mood and affect.    ASSESSMENT/ PLAN:  TODAY:   1.  Diabetes  hgb a1c 6.9 (previous 7.2)  cbg readings slightly worse: will increase novolog to 8 units with meals and will increase levemir to 10 units nightly and will monitor    MD is aware of resident's narcotic use and is in agreement with current plan of care. We will attempt to wean resident as apropriate   Ok Edwards NP [facility]  Contact [phone] Monday through Friday 8am- 5pm  After hours call [phone]

## 2017-01-01 DIAGNOSIS — F0391 Unspecified dementia with behavioral disturbance: Secondary | ICD-10-CM | POA: Diagnosis not present

## 2017-01-01 DIAGNOSIS — M6281 Muscle weakness (generalized): Secondary | ICD-10-CM | POA: Diagnosis not present

## 2017-01-01 DIAGNOSIS — R1311 Dysphagia, oral phase: Secondary | ICD-10-CM | POA: Diagnosis not present

## 2017-01-02 DIAGNOSIS — M6281 Muscle weakness (generalized): Secondary | ICD-10-CM | POA: Diagnosis not present

## 2017-01-02 DIAGNOSIS — F0391 Unspecified dementia with behavioral disturbance: Secondary | ICD-10-CM | POA: Diagnosis not present

## 2017-01-02 DIAGNOSIS — R1311 Dysphagia, oral phase: Secondary | ICD-10-CM | POA: Diagnosis not present

## 2017-01-03 DIAGNOSIS — M6281 Muscle weakness (generalized): Secondary | ICD-10-CM | POA: Diagnosis not present

## 2017-01-03 DIAGNOSIS — R1311 Dysphagia, oral phase: Secondary | ICD-10-CM | POA: Diagnosis not present

## 2017-01-03 DIAGNOSIS — F0391 Unspecified dementia with behavioral disturbance: Secondary | ICD-10-CM | POA: Diagnosis not present

## 2017-01-04 ENCOUNTER — Encounter: Payer: Self-pay | Admitting: Adult Health

## 2017-01-04 ENCOUNTER — Non-Acute Institutional Stay (SKILLED_NURSING_FACILITY): Payer: Medicare Other | Admitting: Adult Health

## 2017-01-04 DIAGNOSIS — R1311 Dysphagia, oral phase: Secondary | ICD-10-CM | POA: Diagnosis not present

## 2017-01-04 DIAGNOSIS — F32A Depression, unspecified: Secondary | ICD-10-CM

## 2017-01-04 DIAGNOSIS — K219 Gastro-esophageal reflux disease without esophagitis: Secondary | ICD-10-CM

## 2017-01-04 DIAGNOSIS — F329 Major depressive disorder, single episode, unspecified: Secondary | ICD-10-CM | POA: Diagnosis not present

## 2017-01-04 DIAGNOSIS — R0982 Postnasal drip: Secondary | ICD-10-CM

## 2017-01-04 DIAGNOSIS — F209 Schizophrenia, unspecified: Secondary | ICD-10-CM | POA: Diagnosis not present

## 2017-01-04 DIAGNOSIS — K5909 Other constipation: Secondary | ICD-10-CM | POA: Diagnosis not present

## 2017-01-04 DIAGNOSIS — F0391 Unspecified dementia with behavioral disturbance: Secondary | ICD-10-CM | POA: Diagnosis not present

## 2017-01-04 DIAGNOSIS — M6281 Muscle weakness (generalized): Secondary | ICD-10-CM | POA: Diagnosis not present

## 2017-01-04 NOTE — Progress Notes (Signed)
Location:   [facility] Room Number: 225 B Place of Service:  SNF (31)   CODE STATUS: Full Code  Allergies  Allergen Reactions  . Ace Inhibitors   . Ether Nausea And Vomiting    Chief Complaint  Patient presents with  . Medical Management of Chronic Issues    Gerd; constipation; depression; schizophrenia; post nasal drip     HPI:  She is an 83 year old long term resident of this facility being seen for the management of her chronic illnesses: gerd; constipation; depression; schizophrenia; post nasal drip. She denies any post nasal drip; no cough present; no feelings of depression present. There are no staff concerns at this time.    Past Medical History:  Diagnosis Date  . Allergic rhinitis   . CATARACT 06/06/2006   Qualifier: Diagnosis of  By: Genene Churn MD, Janett Billow    . Depressive disorder   . Diabetes mellitus type 2 in obese ([place])   . Essential hypertension, benign 02/24/2015  . Fungal dermatitis   . Hyperlipidemia   . Macular degeneration   . Mild cognitive impairment   . Narcissism   . Normocytic anemia   . Phobia   . Schizophrenia, paranoid ([place])   . Stress incontinence, female     Past Surgical History:  Procedure Laterality Date  . ABDOMINAL HYSTERECTOMY     due to fibroids  . CATARACT EXTRACTION  6 16 2006  . CRYOTHERAPY  2 15 2006   facial AK  . hyperplastic   4 20 2006   AK shave biopsy  . intraocular injection  [id number]  . TOTAL ABDOMINAL HYSTERECTOMY W/ BILATERAL SALPINGOOPHORECTOMY     due to endometriosis  non malignant    Social History   Social History  . Marital status: Divorced    Spouse name: N/A  . Number of children: N/A  . Years of education: N/A   Occupational History  . Not on file.   Social History Main Topics  . Smoking status: Former Research scientist (life sciences)  . Smokeless tobacco: Former Systems developer    Types: Chew  . Alcohol use No  . Drug use: No  . Sexual activity: Not on file   Other Topics Concern  . Not on file   Social  History Narrative   Lives in [facility] (assisted living), remotely smoked and chewed tobacco. Daughter([name])  drives her, independent of all ADL's   Family History  Problem Relation Age of Onset  . Heart disease Mother   . Heart disease Father   . Stroke Father   . Cancer Cousin        liver and colon  . Stroke Sister       VITAL SIGNS BP (!) 186/78   Pulse 96   Temp (!) 97.2 F (36.2 C)   Resp 20   Ht 4\' 9"  (1.448 m)   Wt 178 lb 8 oz (81 kg)   SpO2 97%   BMI 38.63 kg/m   Patient's Medications  New Prescriptions   No medications on file  Previous Medications   ACETAMINOPHEN (TYLENOL) 325 MG TABLET    Take 650 mg by mouth every 6 (six) hours as needed.    AMINO ACIDS-PROTEIN HYDROLYS PO    Take 30 mLs by mouth 2 (two) times daily.   ASPIRIN EC 325 MG TABLET    Take 1 tablet (325 mg total) by mouth daily.   ATORVASTATIN (LIPITOR) 20 MG TABLET    Take 1 tablet (20 mg total) by  mouth at bedtime.   BENZTROPINE (COGENTIN) 1 MG TABLET    Take 1 mg by mouth at bedtime.   CALCIUM CARBONATE-VITAMIN D (CALCIUM 600-D) 600-400 MG-UNIT PER TABLET    Take 1 tablet by mouth at bedtime.    CHOLECALCIFEROL (VITAMIN D) 1000 UNITS TABLET    Take 1,000 Units by mouth daily.   CYANOCOBALAMIN 1000 MCG TABLET    Take 1,000 mcg by mouth at bedtime.    DOCUSATE SODIUM (COLACE) 100 MG CAPSULE    Take 1 capsule (100 mg total) by mouth daily. For constipation   DONEPEZIL (ARICEPT) 10 MG TABLET    Take 10 mg by mouth at bedtime.   ESCITALOPRAM (LEXAPRO) 20 MG TABLET    Take 20 mg by mouth daily.   FLUTICASONE (FLONASE) 50 MCG/ACT NASAL SPRAY    Place 1 spray into both nostrils daily.   HYPROMELLOSE 0.4 % SOLN    Place 1 drop into both eyes 3 (three) times daily.   INSULIN ASPART (NOVOLOG) 100 UNIT/ML INJECTION    Inject 8 Units into the skin 3 (three) times daily before meals. Notify MD if BS is less than 70 or greater than 450. Hold Insulin if BS is less than 70   INSULIN DETEMIR (LEVEMIR  FLEXPEN) 100 UNIT/ML PEN    Inject 10 Units into the skin at bedtime.    IPRATROPIUM (ATROVENT) 0.03 % NASAL SPRAY    Place 2 sprays into both nostrils 2 (two) times daily before a meal. And at bedtime   LORATADINE (CLARITIN) 10 MG TABLET    Take 10 mg by mouth daily.   LOSARTAN (COZAAR) 25 MG TABLET    Take 25 mg by mouth daily.   MEMANTINE HCL ER (NAMENDA XR) 28 MG CP24    Take 28 mg by mouth daily. For dementia   MULTIPLE VITAMINS-MINERALS (DECUBI-VITE) CAPS    Take 1 capsule by mouth daily.   NUTRITIONAL SUPPLEMENTS (NUTRITIONAL SUPPLEMENT PO)    Take by mouth. HSG Puree texture, Nectar consistency.  May have Bananas, soft cookies and soft centered sandwiches with distant supervision.  Large portions at meals   OLANZAPINE (ZYPREXA) 5 MG TABLET    Take 5 mg by mouth at bedtime.   PANTOPRAZOLE (PROTONIX) 40 MG TABLET    Take 40 mg by mouth daily.   TRIAMCINOLONE (KENALOG) 0.025 % OINTMENT    Apply 1 application topically daily. Apply to arms for skin integrity  Modified Medications   No medications on file  Discontinued Medications   No medications on file     SIGNIFICANT DIAGNOSTIC EXAMS  PREVIOUS  12-30-14: right eye intraocular injection   02-06-16: ct of head: Atrophy and small vessel disease, similar to priors. No acute intracranial findings.  02-06-16: mri of brain: Atrophy and small vessel disease, similar to priors. No acute stroke is evident.  02-06-16: ct angio of head and neck: CT HEADNo intracranial hemorrhage or CT evidence of large acute infarct. Chronic microvascular changes. No intracranial mass abnormal enhancement. Global atrophy without hydrocephalus.  CTA NECK Plaque right carotid bifurcation and proximal right internal carotid artery with less than 50% diameter narrowing. Mild plaque proximal left internal carotid artery with less than 50% diameter narrowing. Plaque with mild narrowing origin of vertebral arteries bilaterally. Left vertebral artery slightly  dominant size. CTA HEAD Plaque with mild narrowing cavernous segment internal carotid artery bilaterally. Anterior circulation without medium or large size vessel significant stenosis or occlusion. Mild narrowing distal right vertebral artery. No significant stenosis left  vertebral artery or basilar artery.    03-12-16: EEG: This is a mildly abnormal EEG due to the presence of  moderate generalized slowing which is indicative of bihemispheric dysfunction which is a nonspecific finding seen in a variety of degenerative, hypoxic, ischemic, toxic, metabolic etiologies. No definite epileptiform activity is noted  12-03-16: chest x-ray: no acute cardiopulmonary disease  NO NEW EXAMS    LABS REVIEWED: PREVIOUS  12-09-15: glucose 256; bun 14.6; creat 1.01; k+ 4.5; na++ 137; liver normal albumin 3.5; chol 156; ldl 61; trig 246; hdl 45  hgb a1c 7.6  02-06-16: wbc 5.6; hgb 13.0; hct 40.3; mcv 86.1; plt 149; glucose 239; bun 8; creat 0.84; k+ 4.0; na++ 132; liver normal albumin 3.1; blood culture: no growth; UA: neg 02-15-16: pre-albumin 10  03-14-16: urine micro-albumin <1.2  03-27-16: glucose 126; bun 11.7; creat 0.98; k+ 4.5; na++ 143; hgb a1c 7.8  07-02-16: wbc 8.8; hgb 12.5; hct 37.9 ;mcv 85.6; plt 219; glucose 83; bun 22.5; creat 1.12; k+ 4.8; na++ 140; ca 0.1; liver  Normal albumin 3.4  hgb a1c 7.4 chol 148; ldl 75; trig 165; hdl 40  07-25-16: wbc 7.8; hgb 13.0; hct 39.9; mcv 84.9;  glucose 203; bun 15.5; creat 0.73; k+ 4.4 na++ 138; ca 9.1; hgb a1c 7.2  07-26-16: chol 140; ldl 92; trig 142; hdl 43   11-02-16: glucose 198; bun 17.0; creat 0.89; k+ 4.7; na++ 137; liver normal albumin 4.0 hgb a1c 6.9; chol 162; ldl 85; trig 157; hdl 45  NO NEW LABS   Review of Systems  Constitutional: Negative for malaise/fatigue.  Respiratory: Negative for cough and shortness of breath.   Cardiovascular: Negative for chest pain, palpitations and leg swelling.  Gastrointestinal: Negative for abdominal pain,  constipation and heartburn.  Musculoskeletal: Negative for back pain, joint pain and myalgias.  Skin: Negative.   Neurological: Negative for dizziness.  Psychiatric/Behavioral: The patient is not nervous/anxious.    Physical Exam  Constitutional: She appears well-developed and well-nourished. No distress.  Eyes: Conjunctivae are normal.  Neck: Neck supple. No thyromegaly present.  Cardiovascular: Normal rate, regular rhythm, normal heart sounds and intact distal pulses.   Pulmonary/Chest: Effort normal and breath sounds normal. No respiratory distress. She has no wheezes.  Abdominal: Soft. Bowel sounds are normal. She exhibits no distension. There is no tenderness.  Musculoskeletal: She exhibits no edema.  Is able to move all extremities  Lymphadenopathy:    She has no cervical adenopathy.  Neurological: She is alert.  Skin: Skin is warm and dry. She is not diaphoretic.  Psychiatric: She has a normal mood and affect.   ASSESSMENT/ PLAN:   TODAY:   1. Gerd: stable will continue protonix 40 mg daily   2. Constipation stable : will continue colace daily    3. Depression: stable  will continue lexapro 20 mg nightly will monitor   4. Schizophrenia: is stable continue  zyprexa 5  mg nightly and will monitor   Is followed by psych services.  Will continue cogentin 1 mg nightly for tardive dyskinesia and will monitor   5. Post nasal drip syndrome: is stable; no cough or congestion present: will continue atrovent ac and hs     PREVIOUS     6. Macular degeneration:  Stable will contine   zaditor to both eyes twice daily   7. Allergic rhinitis: stable  will continue flonase to both nares daily and zyrtec 10 mg daily   8. TIA: is presently stable; will continue asa 325  mg daily   9. Hypertension: b/p 186/78 is worse:  will continue lisinopril 5 mg daily and will increase cozaar to 50 mg daily   10. Dementia without behavior disturbance; is without change will continue aricept 10 mg  daily and namenda xr 28 mg daily will not make changes will monitor   Her current weight is 178  pounds and is currently stable.   11. Tremor: stable  she does not have a resting tremor or lip tremor . Will continue cogentin 1 mg nightly   12. Dyslipidemia: stable  ldl 45; trig 162  will continue lipitor 20 mg daily   13. Diabetes stable   hgb a1c 6.9 (previous 7.2)  Will continue levemir 10  units;  novolog  8 units with meals     14. Dysphagia:stable she is on nectar thin liquids; no signs of aspiration present    Will check bmp in one week.     MD is aware of resident's narcotic use and is in agreement with current plan of care. We will attempt to wean resident as apropriate   Ok Edwards NP [facility]  Contact [phone] Monday through Friday 8am- 5pm  After hours call [phone]

## 2017-01-05 DIAGNOSIS — R0982 Postnasal drip: Secondary | ICD-10-CM | POA: Insufficient documentation

## 2017-01-07 DIAGNOSIS — R1311 Dysphagia, oral phase: Secondary | ICD-10-CM | POA: Diagnosis not present

## 2017-01-07 DIAGNOSIS — M6281 Muscle weakness (generalized): Secondary | ICD-10-CM | POA: Diagnosis not present

## 2017-01-07 DIAGNOSIS — F0391 Unspecified dementia with behavioral disturbance: Secondary | ICD-10-CM | POA: Diagnosis not present

## 2017-01-09 DIAGNOSIS — R1311 Dysphagia, oral phase: Secondary | ICD-10-CM | POA: Diagnosis not present

## 2017-01-09 DIAGNOSIS — M6281 Muscle weakness (generalized): Secondary | ICD-10-CM | POA: Diagnosis not present

## 2017-01-09 DIAGNOSIS — F0391 Unspecified dementia with behavioral disturbance: Secondary | ICD-10-CM | POA: Diagnosis not present

## 2017-01-10 ENCOUNTER — Encounter: Payer: Self-pay | Admitting: Adult Health

## 2017-01-10 ENCOUNTER — Non-Acute Institutional Stay (SKILLED_NURSING_FACILITY): Payer: Medicare Other | Admitting: Adult Health

## 2017-01-10 DIAGNOSIS — R1311 Dysphagia, oral phase: Secondary | ICD-10-CM | POA: Diagnosis not present

## 2017-01-10 DIAGNOSIS — J159 Unspecified bacterial pneumonia: Secondary | ICD-10-CM

## 2017-01-10 DIAGNOSIS — F0391 Unspecified dementia with behavioral disturbance: Secondary | ICD-10-CM | POA: Diagnosis not present

## 2017-01-10 DIAGNOSIS — M6281 Muscle weakness (generalized): Secondary | ICD-10-CM | POA: Diagnosis not present

## 2017-01-10 DIAGNOSIS — R0989 Other specified symptoms and signs involving the circulatory and respiratory systems: Secondary | ICD-10-CM | POA: Diagnosis not present

## 2017-01-10 NOTE — Progress Notes (Signed)
Location:   [place] Room Number: 225 B Place of Service:  SNF (31)   CODE STATUS: Full Code  Allergies  Allergen Reactions  . Ace Inhibitors   . Ether Nausea And Vomiting    Chief Complaint  Patient presents with  . Acute Visit    Congestion    HPI:  Staff reports that for the past couple of days she has had a cough. Today she has began using her accessory muscles to help her breath. She tell me that she has a cough; is short of breath; denies sputum and chest pain. There are no reports of fever present; there are no reports of changes in sleep patterns or appetite.    Past Medical History:  Diagnosis Date  . Allergic rhinitis   . CATARACT 06/06/2006   Qualifier: Diagnosis of  By: Genene Churn MD, Janett Billow    . Depressive disorder   . Diabetes mellitus type 2 in obese ([place])   . Essential hypertension, benign 02/24/2015  . Fungal dermatitis   . Hyperlipidemia   . Macular degeneration   . Mild cognitive impairment   . Narcissism ([place])   . Normocytic anemia   . Phobia   . Schizophrenia, paranoid ([place])   . Stress incontinence, female     Past Surgical History:  Procedure Laterality Date  . ABDOMINAL HYSTERECTOMY     due to fibroids  . CATARACT EXTRACTION  6 16 2006  . CRYOTHERAPY  2 15 2006   facial AK  . hyperplastic   4 20 2006   AK shave biopsy  . intraocular injection  [id number]  . TOTAL ABDOMINAL HYSTERECTOMY W/ BILATERAL SALPINGOOPHORECTOMY     due to endometriosis  non malignant    Social History   Social History  . Marital status: Divorced    Spouse name: N/A  . Number of children: N/A  . Years of education: N/A   Occupational History  . Not on file.   Social History Main Topics  . Smoking status: Former Research scientist (life sciences)  . Smokeless tobacco: Former Systems developer    Types: Chew  . Alcohol use No  . Drug use: No  . Sexual activity: Not on file   Other Topics Concern  . Not on file   Social History Narrative   Lives in [facility] (assisted  living), remotely smoked and chewed tobacco. Daughter([name])  drives her, independent of all ADL's   Family History  Problem Relation Age of Onset  . Heart disease Mother   . Heart disease Father   . Stroke Father   . Cancer Cousin        liver and colon  . Stroke Sister       VITAL SIGNS BP 132/78   Pulse 94   Temp (!) 97.3 F (36.3 C)   Resp 20   Ht 4\' 9"  (1.448 m)   Wt 176 lb 6.4 oz (80 kg)   SpO2 97%   BMI 38.17 kg/m    Patient's Medications  New Prescriptions   No medications on file  Previous Medications   ACETAMINOPHEN (TYLENOL) 325 MG TABLET    Take 650 mg by mouth every 6 (six) hours as needed.    AMINO ACIDS-PROTEIN HYDROLYS PO    Take 30 mLs by mouth 2 (two) times daily.   ASPIRIN EC 325 MG TABLET    Take 1 tablet (325 mg total) by mouth daily.   ATORVASTATIN (LIPITOR) 20 MG TABLET    Take 1 tablet (20 mg  total) by mouth at bedtime.   BENZTROPINE (COGENTIN) 1 MG TABLET    Take 1 mg by mouth at bedtime.   CALCIUM CARBONATE-VITAMIN D (CALCIUM 600-D) 600-400 MG-UNIT PER TABLET    Take 1 tablet by mouth at bedtime.    CHOLECALCIFEROL (VITAMIN D) 1000 UNITS TABLET    Take 1,000 Units by mouth daily.   CYANOCOBALAMIN 1000 MCG TABLET    Take 1,000 mcg by mouth at bedtime.    DOCUSATE SODIUM (COLACE) 100 MG CAPSULE    Take 1 capsule (100 mg total) by mouth daily. For constipation   DONEPEZIL (ARICEPT) 10 MG TABLET    Take 10 mg by mouth at bedtime.   ESCITALOPRAM (LEXAPRO) 20 MG TABLET    Take 20 mg by mouth daily.   FLUTICASONE (FLONASE) 50 MCG/ACT NASAL SPRAY    Place 1 spray into both nostrils daily.   HYPROMELLOSE 0.4 % SOLN    Place 1 drop into both eyes 3 (three) times daily.   INSULIN ASPART (NOVOLOG) 100 UNIT/ML INJECTION    Inject 8 Units into the skin 3 (three) times daily before meals. Notify MD if BS is less than 70 or greater than 450. Hold Insulin if BS is less than 70   INSULIN DETEMIR (LEVEMIR FLEXPEN) 100 UNIT/ML PEN    Inject 10 Units into the skin  at bedtime.    IPRATROPIUM (ATROVENT) 0.03 % NASAL SPRAY    Place 2 sprays into both nostrils 2 (two) times daily before a meal. And at bedtime   LORATADINE (CLARITIN) 10 MG TABLET    Take 10 mg by mouth daily.   LOSARTAN (COZAAR) 25 MG TABLET    Take 25 mg by mouth daily.   MEMANTINE HCL ER (NAMENDA XR) 28 MG CP24    Take 28 mg by mouth daily. For dementia   MULTIPLE VITAMINS-MINERALS (DECUBI-VITE) CAPS    Take 1 capsule by mouth daily.   NUTRITIONAL SUPPLEMENTS (NUTRITIONAL SUPPLEMENT PO)    Take by mouth. HSG Puree texture, Nectar consistency.  May have Bananas, soft cookies and soft centered sandwiches with distant supervision.  Large portions at meals   OLANZAPINE (ZYPREXA) 5 MG TABLET    Take 5 mg by mouth at bedtime.   TRIAMCINOLONE (KENALOG) 0.025 % OINTMENT    Apply 1 application topically daily. Apply to arms for skin integrity  Modified Medications   No medications on file  Discontinued Medications   PANTOPRAZOLE (PROTONIX) 40 MG TABLET    Take 40 mg by mouth daily.     SIGNIFICANT DIAGNOSTIC EXAMS  PREVIOUS  12-30-14: right eye intraocular injection   02-06-16: ct of head: Atrophy and small vessel disease, similar to priors. No acute intracranial findings.  02-06-16: mri of brain: Atrophy and small vessel disease, similar to priors. No acute stroke is evident.  02-06-16: ct angio of head and neck: CT HEADNo intracranial hemorrhage or CT evidence of large acute infarct. Chronic microvascular changes. No intracranial mass abnormal enhancement. Global atrophy without hydrocephalus.  CTA NECK Plaque right carotid bifurcation and proximal right internal carotid artery with less than 50% diameter narrowing. Mild plaque proximal left internal carotid artery with less than 50% diameter narrowing. Plaque with mild narrowing origin of vertebral arteries bilaterally. Left vertebral artery slightly dominant size. CTA HEAD Plaque with mild narrowing cavernous segment internal  carotid artery bilaterally. Anterior circulation without medium or large size vessel significant stenosis or occlusion. Mild narrowing distal right vertebral artery. No significant stenosis left vertebral artery or basilar  artery.    03-12-16: EEG: This is a mildly abnormal EEG due to the presence of  moderate generalized slowing which is indicative of bihemispheric dysfunction which is a nonspecific finding seen in a variety of degenerative, hypoxic, ischemic, toxic, metabolic etiologies. No definite epileptiform activity is noted  12-03-16: chest x-ray: no acute cardiopulmonary disease  NO NEW EXAMS    LABS REVIEWED: PREVIOUS   02-06-16: wbc 5.6; hgb 13.0; hct 40.3; mcv 86.1; plt 149; glucose 239; bun 8; creat 0.84; k+ 4.0; na++ 132; liver normal albumin 3.1; blood culture: no growth; UA: neg 02-15-16: pre-albumin 10  03-14-16: urine micro-albumin <1.2  03-27-16: glucose 126; bun 11.7; creat 0.98; k+ 4.5; na++ 143; hgb a1c 7.8  07-02-16: wbc 8.8; hgb 12.5; hct 37.9 ;mcv 85.6; plt 219; glucose 83; bun 22.5; creat 1.12; k+ 4.8; na++ 140; ca 0.1; liver  Normal albumin 3.4  hgb a1c 7.4 chol 148; ldl 75; trig 165; hdl 40  07-25-16: wbc 7.8; hgb 13.0; hct 39.9; mcv 84.9;  glucose 203; bun 15.5; creat 0.73; k+ 4.4 na++ 138; ca 9.1; hgb a1c 7.2  07-26-16: chol 140; ldl 92; trig 142; hdl 43   11-02-16: glucose 198; bun 17.0; creat 0.89; k+ 4.7; na++ 137; liver normal albumin 4.0 hgb a1c 6.9; chol 162; ldl 85; trig 157; hdl 45  NO NEW LABS  Review of Systems  Constitutional: Negative for malaise/fatigue.  Respiratory: Positive for cough and shortness of breath. Negative for sputum production.   Cardiovascular: Negative for chest pain, palpitations and leg swelling.  Gastrointestinal: Negative for abdominal pain, constipation and heartburn.  Musculoskeletal: Negative for back pain, joint pain and myalgias.  Skin: Negative.   Neurological: Negative for dizziness.  Psychiatric/Behavioral: The patient is  not nervous/anxious.    Physical Exam  Constitutional: She appears well-developed and well-nourished. She appears distressed.  Neck: Neck supple.  Pulmonary/Chest:  Has increased effort with accessory muscles used Has rhonchi scattered throughout   Abdominal: Soft. Bowel sounds are normal. She exhibits no distension. There is no tenderness.  Musculoskeletal:  Is able to move all extremities   Lymphadenopathy:    She has no cervical adenopathy.  Neurological: She is alert.  Skin: Skin is warm and dry. She is not diaphoretic.  Psychiatric: She has a normal mood and affect.    ASSESSMENT/ PLAN:  TODAY:   1. Pneumonia, health care associated: will get chest x-ray; will begin duoneb every 6 hours for one week; will begin augmentin 875 mg twice daily for 10 days with probiotic   Will continue to monitor  MD is aware of resident's narcotic use and is in agreement with current plan of care. We will attempt to wean resident as apropriate   Ok Edwards NP [facility]  Contact [phone] Monday through Friday 8am- 5pm  After hours call [phone]

## 2017-01-11 DIAGNOSIS — R1311 Dysphagia, oral phase: Secondary | ICD-10-CM | POA: Diagnosis not present

## 2017-01-11 DIAGNOSIS — F0391 Unspecified dementia with behavioral disturbance: Secondary | ICD-10-CM | POA: Diagnosis not present

## 2017-01-11 DIAGNOSIS — M6281 Muscle weakness (generalized): Secondary | ICD-10-CM | POA: Diagnosis not present

## 2017-01-12 DIAGNOSIS — M6281 Muscle weakness (generalized): Secondary | ICD-10-CM | POA: Diagnosis not present

## 2017-01-12 DIAGNOSIS — R1311 Dysphagia, oral phase: Secondary | ICD-10-CM | POA: Diagnosis not present

## 2017-01-12 DIAGNOSIS — F0391 Unspecified dementia with behavioral disturbance: Secondary | ICD-10-CM | POA: Diagnosis not present

## 2017-01-14 DIAGNOSIS — R1311 Dysphagia, oral phase: Secondary | ICD-10-CM | POA: Diagnosis not present

## 2017-01-14 DIAGNOSIS — F0391 Unspecified dementia with behavioral disturbance: Secondary | ICD-10-CM | POA: Diagnosis not present

## 2017-01-14 DIAGNOSIS — M6281 Muscle weakness (generalized): Secondary | ICD-10-CM | POA: Diagnosis not present

## 2017-01-15 DIAGNOSIS — M6281 Muscle weakness (generalized): Secondary | ICD-10-CM | POA: Diagnosis not present

## 2017-01-15 DIAGNOSIS — F0391 Unspecified dementia with behavioral disturbance: Secondary | ICD-10-CM | POA: Diagnosis not present

## 2017-01-15 DIAGNOSIS — R1311 Dysphagia, oral phase: Secondary | ICD-10-CM | POA: Diagnosis not present

## 2017-01-16 DIAGNOSIS — M6281 Muscle weakness (generalized): Secondary | ICD-10-CM | POA: Diagnosis not present

## 2017-01-16 DIAGNOSIS — F0391 Unspecified dementia with behavioral disturbance: Secondary | ICD-10-CM | POA: Diagnosis not present

## 2017-01-16 DIAGNOSIS — R1311 Dysphagia, oral phase: Secondary | ICD-10-CM | POA: Diagnosis not present

## 2017-01-17 DIAGNOSIS — F0391 Unspecified dementia with behavioral disturbance: Secondary | ICD-10-CM | POA: Diagnosis not present

## 2017-01-17 DIAGNOSIS — M6281 Muscle weakness (generalized): Secondary | ICD-10-CM | POA: Diagnosis not present

## 2017-01-17 DIAGNOSIS — R1311 Dysphagia, oral phase: Secondary | ICD-10-CM | POA: Diagnosis not present

## 2017-02-01 DIAGNOSIS — J159 Unspecified bacterial pneumonia: Secondary | ICD-10-CM | POA: Insufficient documentation

## 2017-02-01 DIAGNOSIS — Z23 Encounter for immunization: Secondary | ICD-10-CM | POA: Diagnosis not present

## 2017-02-04 ENCOUNTER — Encounter (INDEPENDENT_AMBULATORY_CARE_PROVIDER_SITE_OTHER): Payer: Medicare Other | Admitting: Ophthalmology

## 2017-02-04 DIAGNOSIS — M6281 Muscle weakness (generalized): Secondary | ICD-10-CM | POA: Diagnosis not present

## 2017-02-04 DIAGNOSIS — H353231 Exudative age-related macular degeneration, bilateral, with active choroidal neovascularization: Secondary | ICD-10-CM | POA: Diagnosis not present

## 2017-02-04 DIAGNOSIS — R1311 Dysphagia, oral phase: Secondary | ICD-10-CM | POA: Diagnosis not present

## 2017-02-04 DIAGNOSIS — I10 Essential (primary) hypertension: Secondary | ICD-10-CM

## 2017-02-04 DIAGNOSIS — F0391 Unspecified dementia with behavioral disturbance: Secondary | ICD-10-CM | POA: Diagnosis not present

## 2017-02-04 DIAGNOSIS — H35033 Hypertensive retinopathy, bilateral: Secondary | ICD-10-CM

## 2017-02-04 DIAGNOSIS — H43813 Vitreous degeneration, bilateral: Secondary | ICD-10-CM

## 2017-02-05 ENCOUNTER — Encounter: Payer: Self-pay | Admitting: Adult Health

## 2017-02-05 ENCOUNTER — Non-Acute Institutional Stay (SKILLED_NURSING_FACILITY): Payer: Medicare Other | Admitting: Adult Health

## 2017-02-05 DIAGNOSIS — I10 Essential (primary) hypertension: Secondary | ICD-10-CM | POA: Diagnosis not present

## 2017-02-05 DIAGNOSIS — J3089 Other allergic rhinitis: Secondary | ICD-10-CM

## 2017-02-05 DIAGNOSIS — F0150 Vascular dementia without behavioral disturbance: Secondary | ICD-10-CM | POA: Diagnosis not present

## 2017-02-05 DIAGNOSIS — M6281 Muscle weakness (generalized): Secondary | ICD-10-CM | POA: Diagnosis not present

## 2017-02-05 DIAGNOSIS — F0391 Unspecified dementia with behavioral disturbance: Secondary | ICD-10-CM | POA: Diagnosis not present

## 2017-02-05 DIAGNOSIS — G459 Transient cerebral ischemic attack, unspecified: Secondary | ICD-10-CM | POA: Diagnosis not present

## 2017-02-05 DIAGNOSIS — R1311 Dysphagia, oral phase: Secondary | ICD-10-CM | POA: Diagnosis not present

## 2017-02-05 NOTE — Progress Notes (Signed)
Location:   [place] Room Number: 225 B Place of Service:  SNF (31)   CODE STATUS: Full Code  Allergies  Allergen Reactions  . Ace Inhibitors   . Ether Nausea And Vomiting    Chief Complaint  Patient presents with  . Medical Management of Chronic Issues    TIA; allergic rhinitis; hypertension; dementia     HPI:  She is a 83 year old long term resident of this facility being seen for the management of her chronic illnesses: TIA;  essential benign hypertension; chronic non seasonal allergic rhinitis due to pollen; dementia without behavioral disturbance. She is complaining of cough without sputum without shortness of breath. Her lungs are clear. She denies fever; no special time of day that is worse for her cough. She denies any ear pain.    Past Medical History:  Diagnosis Date  . Allergic rhinitis   . CATARACT 06/06/2006   Qualifier: Diagnosis of  By: Genene Churn MD, Janett Billow    . Depressive disorder   . Diabetes mellitus type 2 in obese ([place])   . Essential hypertension, benign 02/24/2015  . Fungal dermatitis   . Hyperlipidemia   . Macular degeneration   . Mild cognitive impairment   . Narcissism ([place])   . Normocytic anemia   . Phobia   . Schizophrenia, paranoid ([place])   . Stress incontinence, female     Past Surgical History:  Procedure Laterality Date  . ABDOMINAL HYSTERECTOMY     due to fibroids  . CATARACT EXTRACTION  6 16 2006  . CRYOTHERAPY  2 15 2006   facial AK  . hyperplastic   4 20 2006   AK shave biopsy  . intraocular injection  [id number]  . TOTAL ABDOMINAL HYSTERECTOMY W/ BILATERAL SALPINGOOPHORECTOMY     due to endometriosis  non malignant    Social History   Social History  . Marital status: Divorced    Spouse name: N/A  . Number of children: N/A  . Years of education: N/A   Occupational History  . Not on file.   Social History Main Topics  . Smoking status: Former Research scientist (life sciences)  . Smokeless tobacco: Former Systems developer    Types: Chew  .  Alcohol use No  . Drug use: No  . Sexual activity: Not on file   Other Topics Concern  . Not on file   Social History Narrative   Lives in [facility] (assisted living), remotely smoked and chewed tobacco. Daughter([name])  drives her, independent of all ADL's   Family History  Problem Relation Age of Onset  . Heart disease Mother   . Heart disease Father   . Stroke Father   . Cancer Cousin        liver and colon  . Stroke Sister       VITAL SIGNS BP 128/78   Pulse 78   Temp 97.8 F (36.6 C)   Resp 18   Ht 4\' 9"  (1.448 m)   Wt 177 lb 1.6 oz (80.3 kg)   SpO2 97%   BMI 38.32 kg/m   Patient's Medications  New Prescriptions   No medications on file  Previous Medications   ACETAMINOPHEN (TYLENOL) 325 MG TABLET    Take 650 mg by mouth every 6 (six) hours as needed.    AMINO ACIDS-PROTEIN HYDROLYS PO    Take 30 mLs by mouth 2 (two) times daily.   ASPIRIN EC 325 MG TABLET    Take 1 tablet (325 mg total) by  mouth daily.   ATORVASTATIN (LIPITOR) 20 MG TABLET    Take 1 tablet (20 mg total) by mouth at bedtime.   BENZTROPINE (COGENTIN) 1 MG TABLET    Take 1 mg by mouth at bedtime.   CALCIUM CARBONATE-VITAMIN D (CALCIUM 600-D) 600-400 MG-UNIT PER TABLET    Take 1 tablet by mouth at bedtime.    CHOLECALCIFEROL (VITAMIN D) 1000 UNITS TABLET    Take 1,000 Units by mouth daily.   CYANOCOBALAMIN 1000 MCG TABLET    Take 1,000 mcg by mouth at bedtime.    DOCUSATE SODIUM (COLACE) 100 MG CAPSULE    Take 1 capsule (100 mg total) by mouth daily. For constipation   DONEPEZIL (ARICEPT) 10 MG TABLET    Take 10 mg by mouth at bedtime.   ESCITALOPRAM (LEXAPRO) 20 MG TABLET    Take 20 mg by mouth daily.   FLUTICASONE (FLONASE) 50 MCG/ACT NASAL SPRAY    Place 1 spray into both nostrils daily.   HYPROMELLOSE 0.4 % SOLN    Place 1 drop into both eyes 3 (three) times daily.   INSULIN ASPART (NOVOLOG) 100 UNIT/ML INJECTION    Inject 8 Units into the skin 3 (three) times daily before meals.  Notify MD if BS is less than 70 or greater than 450. Hold Insulin if BS is less than 70   INSULIN DETEMIR (LEVEMIR FLEXPEN) 100 UNIT/ML PEN    Inject 10 Units into the skin at bedtime.    IPRATROPIUM (ATROVENT) 0.03 % NASAL SPRAY    Place 2 sprays into both nostrils 2 (two) times daily before a meal. And at bedtime   LORATADINE (CLARITIN) 10 MG TABLET    Take 10 mg by mouth daily.   LOSARTAN (COZAAR) 25 MG TABLET    Take 25 mg by mouth daily.   MEMANTINE HCL ER (NAMENDA XR) 28 MG CP24    Take 28 mg by mouth daily. For dementia   MULTIPLE VITAMINS-MINERALS (DECUBI-VITE) CAPS    Take 1 capsule by mouth daily.   NUTRITIONAL SUPPLEMENTS (NUTRITIONAL SUPPLEMENT PO)    Take by mouth. HSG Puree texture, Nectar consistency.  May have Bananas, soft cookies and soft centered sandwiches with distant supervision.  Large portions at meals   OLANZAPINE (ZYPREXA) 5 MG TABLET    Take 5 mg by mouth at bedtime.   TRIAMCINOLONE (KENALOG) 0.025 % OINTMENT    Apply 1 application topically daily. Apply to arms for skin integrity   TRIMETHOPRIM-POLYMYXIN B (POLYTRIM) OPHTHALMIC SOLUTION    Place 1 drop into the right eye 4 (four) times daily.  Modified Medications   No medications on file  Discontinued Medications   No medications on file     SIGNIFICANT DIAGNOSTIC EXAMS  PREVIOUS  12-30-14: right eye intraocular injection   02-06-16: ct of head: Atrophy and small vessel disease, similar to priors. No acute intracranial findings.  02-06-16: mri of brain: Atrophy and small vessel disease, similar to priors. No acute stroke is evident.  02-06-16: ct angio of head and neck: CT HEADNo intracranial hemorrhage or CT evidence of large acute infarct. Chronic microvascular changes. No intracranial mass abnormal enhancement. Global atrophy without hydrocephalus.  CTA NECK Plaque right carotid bifurcation and proximal right internal carotid artery with less than 50% diameter narrowing. Mild plaque proximal left  internal carotid artery with less than 50% diameter narrowing. Plaque with mild narrowing origin of vertebral arteries bilaterally. Left vertebral artery slightly dominant size. CTA HEAD Plaque with mild narrowing cavernous segment internal carotid  artery bilaterally. Anterior circulation without medium or large size vessel significant stenosis or occlusion. Mild narrowing distal right vertebral artery. No significant stenosis left vertebral artery or basilar artery.    03-12-16: EEG: This is a mildly abnormal EEG due to the presence of  moderate generalized slowing which is indicative of bihemispheric dysfunction which is a nonspecific finding seen in a variety of degenerative, hypoxic, ischemic, toxic, metabolic etiologies. No definite epileptiform activity is noted  12-03-16: chest x-ray: no acute cardiopulmonary disease  NO NEW EXAMS    LABS REVIEWED: PREVIOUS   02-06-16: wbc 5.6; hgb 13.0; hct 40.3; mcv 86.1; plt 149; glucose 239; bun 8; creat 0.84; k+ 4.0; na++ 132; liver normal albumin 3.1; blood culture: no growth; UA: neg 02-15-16: pre-albumin 10  03-14-16: urine micro-albumin <1.2  03-27-16: glucose 126; bun 11.7; creat 0.98; k+ 4.5; na++ 143; hgb a1c 7.8  07-02-16: wbc 8.8; hgb 12.5; hct 37.9 ;mcv 85.6; plt 219; glucose 83; bun 22.5; creat 1.12; k+ 4.8; na++ 140; ca 0.1; liver  Normal albumin 3.4  hgb a1c 7.4 chol 148; ldl 75; trig 165; hdl 40  07-25-16: wbc 7.8; hgb 13.0; hct 39.9; mcv 84.9;  glucose 203; bun 15.5; creat 0.73; k+ 4.4 na++ 138; ca 9.1; hgb a1c 7.2  07-26-16: chol 140; ldl 92; trig 142; hdl 43   11-02-16: glucose 198; bun 17.0; creat 0.89; k+ 4.7; na++ 137; liver normal albumin 4.0 hgb a1c 6.9; chol 162; ldl 85; trig 157; hdl 45  NO NEW LABS  Review of Systems  Constitutional: Negative for malaise/fatigue.  HENT: Negative for ear pain.   Respiratory: Positive for cough. Negative for sputum production and shortness of breath.   Cardiovascular: Negative for chest pain,  palpitations and leg swelling.  Gastrointestinal: Negative for abdominal pain, constipation and heartburn.  Musculoskeletal: Negative for back pain, joint pain and myalgias.  Skin: Negative.   Neurological: Negative for dizziness.  Psychiatric/Behavioral: The patient is not nervous/anxious.    Physical Exam  Constitutional: She appears well-developed and well-nourished. No distress.  HENT:  Right Ear: External ear normal.  Left Ear: External ear normal.  Nose: Nose normal.  Mouth/Throat: Oropharynx is clear and moist.  Neck: Neck supple. No thyromegaly present.  Cardiovascular: Normal rate, regular rhythm, normal heart sounds and intact distal pulses.   Pulmonary/Chest: Effort normal and breath sounds normal. No respiratory distress. She has no wheezes. She has no rales.  Abdominal: Soft. Bowel sounds are normal. She exhibits no distension. There is no tenderness.  Musculoskeletal: She exhibits no edema.  Is able to move all extremities   Lymphadenopathy:    She has no cervical adenopathy.  Neurological: She is alert.  Skin: Skin is warm and dry. She is not diaphoretic.  Psychiatric: She has a normal mood and affect.    ASSESSMENT/ PLAN:   TODAY:   1. Allergic rhinitis: stable  will continue flonase to both nares daily and claritin 10 mg daily   2. TIA: is presently stable; will continue asa 325 mg daily   3. Hypertension: b/p 128/78 is worse:  will continue lisinopril 5 mg daily and cozaar 25 mg daily   4. Dementia without behavior disturbance; is without change will continue aricept 10 mg daily and namenda xr 28 mg daily will not make changes will monitor   Her current weight is 177  pounds and is currently stable.     PREVIOUS     5. Tremor: stable  she does not have a resting tremor  or lip tremor . Will continue cogentin 1 mg nightly   6. Dyslipidemia: stable  ldl 45; trig 162  will continue lipitor 20 mg daily   7. Diabetes stable   hgb a1c 6.9 (previous 7.2)  Will  continue levemir 10  units;  novolog  8 units with meals     8. Dysphagia:stable she is on nectar  liquids; no signs of aspiration present   9. Gerd: stable will continue protonix 40 mg daily   10. Constipation stable : will continue colace daily    11. Depression: stable  will continue lexapro 20 mg nightly will monitor   12. Schizophrenia: is stable continue  zyprexa 5  mg nightly and will monitor   Is followed by psych services.  Will continue cogentin 1 mg nightly for tardive dyskinesia and will monitor   13. Post nasal drip syndrome: is stable;  will continue atrovent ac and hs    MD is aware of resident's narcotic use and is in agreement with current plan of care. We will attempt to wean resident as apropriate     Ok Edwards NP [facility]  Contact [phone] Monday through Friday 8am- 5pm  After hours call [phone]

## 2017-02-06 DIAGNOSIS — R1311 Dysphagia, oral phase: Secondary | ICD-10-CM | POA: Diagnosis not present

## 2017-02-06 DIAGNOSIS — M6281 Muscle weakness (generalized): Secondary | ICD-10-CM | POA: Diagnosis not present

## 2017-02-06 DIAGNOSIS — F0391 Unspecified dementia with behavioral disturbance: Secondary | ICD-10-CM | POA: Diagnosis not present

## 2017-02-07 DIAGNOSIS — R1311 Dysphagia, oral phase: Secondary | ICD-10-CM | POA: Diagnosis not present

## 2017-02-07 DIAGNOSIS — F0391 Unspecified dementia with behavioral disturbance: Secondary | ICD-10-CM | POA: Diagnosis not present

## 2017-02-07 DIAGNOSIS — M6281 Muscle weakness (generalized): Secondary | ICD-10-CM | POA: Diagnosis not present

## 2017-02-08 ENCOUNTER — Encounter: Payer: Self-pay | Admitting: Adult Health

## 2017-02-08 ENCOUNTER — Non-Acute Institutional Stay (SKILLED_NURSING_FACILITY): Payer: Medicare Other | Admitting: Adult Health

## 2017-02-08 DIAGNOSIS — R05 Cough: Secondary | ICD-10-CM | POA: Diagnosis not present

## 2017-02-08 DIAGNOSIS — J3089 Other allergic rhinitis: Secondary | ICD-10-CM

## 2017-02-08 DIAGNOSIS — R053 Chronic cough: Secondary | ICD-10-CM

## 2017-02-08 DIAGNOSIS — F0391 Unspecified dementia with behavioral disturbance: Secondary | ICD-10-CM | POA: Diagnosis not present

## 2017-02-08 DIAGNOSIS — R1311 Dysphagia, oral phase: Secondary | ICD-10-CM | POA: Diagnosis not present

## 2017-02-08 DIAGNOSIS — M6281 Muscle weakness (generalized): Secondary | ICD-10-CM | POA: Diagnosis not present

## 2017-02-08 NOTE — Progress Notes (Signed)
Location:   [place] Room Number: 225 B Place of Service:  SNF (31)   CODE STATUS: Full Code  Allergies  Allergen Reactions  . Ace Inhibitors   . Ether Nausea And Vomiting    Chief Complaint  Patient presents with  . Acute Visit    Cough and Shortness of Breath    HPI:  She is having a continued cough with diaphragm breathing present. She does tell me that at times she is short of breath; coughs without sputum. There are no reports of fevers present. She has been treated with abt earlier this month. I have spoken with the nursing staff and speech therapy; regarding her cough. There is concern that she is declining with her cognition. She would benefit from seeing pulmonology.   Past Medical History:  Diagnosis Date  . Allergic rhinitis   . CATARACT 06/06/2006   Qualifier: Diagnosis of  By: Genene Churn MD, Janett Billow    . Depressive disorder   . Diabetes mellitus type 2 in obese ([place])   . Essential hypertension, benign 02/24/2015  . Fungal dermatitis   . Hyperlipidemia   . Macular degeneration   . Mild cognitive impairment   . Narcissism ([place])   . Normocytic anemia   . Phobia   . Schizophrenia, paranoid ([place])   . Stress incontinence, female     Past Surgical History:  Procedure Laterality Date  . ABDOMINAL HYSTERECTOMY     due to fibroids  . CATARACT EXTRACTION  6 16 2006  . CRYOTHERAPY  2 15 2006   facial AK  . hyperplastic   4 20 2006   AK shave biopsy  . intraocular injection  [id number]  . TOTAL ABDOMINAL HYSTERECTOMY W/ BILATERAL SALPINGOOPHORECTOMY     due to endometriosis  non malignant    Social History   Social History  . Marital status: Divorced    Spouse name: N/A  . Number of children: N/A  . Years of education: N/A   Occupational History  . Not on file.   Social History Main Topics  . Smoking status: Former Research scientist (life sciences)  . Smokeless tobacco: Former Systems developer    Types: Chew  . Alcohol use No  . Drug use: No  . Sexual activity: Not on file     Other Topics Concern  . Not on file   Social History Narrative   Lives in [facility] (assisted living), remotely smoked and chewed tobacco. Daughter([name])  drives her, independent of all ADL's   Family History  Problem Relation Age of Onset  . Heart disease Mother   . Heart disease Father   . Stroke Father   . Cancer Cousin        liver and colon  . Stroke Sister       VITAL SIGNS BP 130/80   Pulse 78   Temp 97.6 F (36.4 C)   Resp 18   Ht 4\' 9"  (1.448 m)   Wt 177 lb 1.6 oz (80.3 kg)   SpO2 97%   BMI 38.32 kg/m   Patient's Medications  New Prescriptions   No medications on file  Previous Medications   ACETAMINOPHEN (TYLENOL) 325 MG TABLET    Take 650 mg by mouth every 6 (six) hours as needed.    AMINO ACIDS-PROTEIN HYDROLYS PO    Take 30 mLs by mouth 2 (two) times daily.   ASPIRIN EC 325 MG TABLET    Take 1 tablet (325 mg total) by mouth daily.   ATORVASTATIN (LIPITOR)  20 MG TABLET    Take 1 tablet (20 mg total) by mouth at bedtime.   BENZTROPINE (COGENTIN) 1 MG TABLET    Take 1 mg by mouth at bedtime.   CALCIUM CARBONATE-VITAMIN D (CALCIUM 600-D) 600-400 MG-UNIT PER TABLET    Take 1 tablet by mouth at bedtime.    CHOLECALCIFEROL (VITAMIN D) 1000 UNITS TABLET    Take 1,000 Units by mouth daily.   CYANOCOBALAMIN 1000 MCG TABLET    Take 1,000 mcg by mouth at bedtime.    DOCUSATE SODIUM (COLACE) 100 MG CAPSULE    Take 1 capsule (100 mg total) by mouth daily. For constipation   DONEPEZIL (ARICEPT) 10 MG TABLET    Take 10 mg by mouth at bedtime.   ESCITALOPRAM (LEXAPRO) 20 MG TABLET    Take 20 mg by mouth daily.   FLUTICASONE (FLONASE) 50 MCG/ACT NASAL SPRAY    Place 1 spray into both nostrils daily.   HYPROMELLOSE 0.4 % SOLN    Place 1 drop into both eyes 3 (three) times daily.   INSULIN ASPART (NOVOLOG) 100 UNIT/ML INJECTION    Inject 8 Units into the skin 3 (three) times daily before meals. Notify MD if BS is less than 70 or greater than 450. Hold Insulin if  BS is less than 70   INSULIN DETEMIR (LEVEMIR FLEXPEN) 100 UNIT/ML PEN    Inject 10 Units into the skin at bedtime.    IPRATROPIUM (ATROVENT) 0.03 % NASAL SPRAY    Place 2 sprays into both nostrils 2 (two) times daily before a meal. And at bedtime   LORATADINE (CLARITIN) 10 MG TABLET    Take 10 mg by mouth daily.   LOSARTAN (COZAAR) 50 MG TABLET    Take 50 mg by mouth daily.    MEMANTINE HCL ER (NAMENDA XR) 28 MG CP24    Take 28 mg by mouth daily. For dementia   MULTIPLE VITAMINS-MINERALS (DECUBI-VITE) CAPS    Take 1 capsule by mouth daily.   NUTRITIONAL SUPPLEMENTS (NUTRITIONAL SUPPLEMENT PO)    Take by mouth. HSG Puree texture, Nectar consistency.  May have Bananas, soft cookies and soft centered sandwiches with distant supervision.  Large portions at meals   OLANZAPINE (ZYPREXA) 5 MG TABLET    Take 5 mg by mouth at bedtime.   TRIAMCINOLONE (KENALOG) 0.025 % OINTMENT    Apply 1 application topically daily. Apply to arms for skin integrity  Modified Medications   No medications on file  Discontinued Medications   No medications on file     SIGNIFICANT DIAGNOSTIC EXAMS  PREVIOUS  12-30-14: right eye intraocular injection   02-06-16: ct of head: Atrophy and small vessel disease, similar to priors. No acute intracranial findings.  02-06-16: mri of brain: Atrophy and small vessel disease, similar to priors. No acute stroke is evident.  02-06-16: ct angio of head and neck: CT HEADNo intracranial hemorrhage or CT evidence of large acute infarct. Chronic microvascular changes. No intracranial mass abnormal enhancement. Global atrophy without hydrocephalus.  CTA NECK Plaque right carotid bifurcation and proximal right internal carotid artery with less than 50% diameter narrowing. Mild plaque proximal left internal carotid artery with less than 50% diameter narrowing. Plaque with mild narrowing origin of vertebral arteries bilaterally. Left vertebral artery slightly dominant size. CTA  HEAD Plaque with mild narrowing cavernous segment internal carotid artery bilaterally. Anterior circulation without medium or large size vessel significant stenosis or occlusion. Mild narrowing distal right vertebral artery. No significant stenosis left vertebral artery  or basilar artery.    03-12-16: EEG: This is a mildly abnormal EEG due to the presence of  moderate generalized slowing which is indicative of bihemispheric dysfunction which is a nonspecific finding seen in a variety of degenerative, hypoxic, ischemic, toxic, metabolic etiologies. No definite epileptiform activity is noted  12-03-16: chest x-ray: no acute cardiopulmonary disease  NO NEW EXAMS    LABS REVIEWED: PREVIOUS   02-06-16: wbc 5.6; hgb 13.0; hct 40.3; mcv 86.1; plt 149; glucose 239; bun 8; creat 0.84; k+ 4.0; na++ 132; liver normal albumin 3.1; blood culture: no growth; UA: neg 02-15-16: pre-albumin 10  03-14-16: urine micro-albumin <1.2  03-27-16: glucose 126; bun 11.7; creat 0.98; k+ 4.5; na++ 143; hgb a1c 7.8  07-02-16: wbc 8.8; hgb 12.5; hct 37.9 ;mcv 85.6; plt 219; glucose 83; bun 22.5; creat 1.12; k+ 4.8; na++ 140; ca 0.1; liver  Normal albumin 3.4  hgb a1c 7.4 chol 148; ldl 75; trig 165; hdl 40  07-25-16: wbc 7.8; hgb 13.0; hct 39.9; mcv 84.9;  glucose 203; bun 15.5; creat 0.73; k+ 4.4 na++ 138; ca 9.1; hgb a1c 7.2  07-26-16: chol 140; ldl 92; trig 142; hdl 43   11-02-16: glucose 198; bun 17.0; creat 0.89; k+ 4.7; na++ 137; liver normal albumin 4.0 hgb a1c 6.9; chol 162; ldl 85; trig 157; hdl 45  NO NEW LABS  Review of Systems  Constitutional: Negative for malaise/fatigue.  HENT: Negative.   Respiratory: Positive for cough and shortness of breath. Negative for sputum production.   Cardiovascular: Negative for chest pain, palpitations and leg swelling.  Gastrointestinal: Negative for abdominal pain, constipation and heartburn.  Musculoskeletal: Negative for back pain, joint pain and myalgias.  Skin: Negative.    Neurological: Negative for dizziness.  Psychiatric/Behavioral: The patient is not nervous/anxious.     Physical Exam  Constitutional: She appears well-developed and well-nourished. No distress.  Neck: Neck supple. No thyromegaly present.  Cardiovascular: Normal rate, regular rhythm, normal heart sounds and intact distal pulses.  Pulmonary/Chest: No respiratory distress.  Has increased effort has few fine crackles at the bases   Abdominal: Soft. Bowel sounds are normal. She exhibits no distension. There is no tenderness.  Musculoskeletal: She exhibits no edema.  Is able to move all extremities   Lymphadenopathy:    She has no cervical adenopathy.  Neurological: She is alert.  Skin: Skin is warm. She is not diaphoretic.  Psychiatric: She has a normal mood and affect.    ASSESSMENT/ PLAN:   TODAY:   1. Chronic non seasonal allergic rhinitis due to pollen 2. Chronic cough  Will have speech therapy see her for dysphagia and cognition Will setup a pulmonology consult  I have spoken with Lorriane Shire who has verbalized understanding    MD is aware of resident's narcotic use and is in agreement with current plan of care. We will attempt to wean resident as apropriate     Ok Edwards NP [facility]  Contact [phone] Monday through Friday 8am- 5pm  After hours call [phone]

## 2017-02-09 DIAGNOSIS — M6281 Muscle weakness (generalized): Secondary | ICD-10-CM | POA: Diagnosis not present

## 2017-02-09 DIAGNOSIS — F0391 Unspecified dementia with behavioral disturbance: Secondary | ICD-10-CM | POA: Diagnosis not present

## 2017-02-09 DIAGNOSIS — R1311 Dysphagia, oral phase: Secondary | ICD-10-CM | POA: Diagnosis not present

## 2017-02-11 ENCOUNTER — Ambulatory Visit (INDEPENDENT_AMBULATORY_CARE_PROVIDER_SITE_OTHER): Payer: Medicare Other | Admitting: Podiatry

## 2017-02-11 ENCOUNTER — Encounter: Payer: Self-pay | Admitting: Podiatry

## 2017-02-11 DIAGNOSIS — M79674 Pain in right toe(s): Secondary | ICD-10-CM

## 2017-02-11 DIAGNOSIS — M79676 Pain in unspecified toe(s): Secondary | ICD-10-CM | POA: Diagnosis not present

## 2017-02-11 DIAGNOSIS — M79675 Pain in left toe(s): Secondary | ICD-10-CM | POA: Diagnosis not present

## 2017-02-11 DIAGNOSIS — F0391 Unspecified dementia with behavioral disturbance: Secondary | ICD-10-CM | POA: Diagnosis not present

## 2017-02-11 DIAGNOSIS — B351 Tinea unguium: Secondary | ICD-10-CM | POA: Diagnosis not present

## 2017-02-11 DIAGNOSIS — E1140 Type 2 diabetes mellitus with diabetic neuropathy, unspecified: Secondary | ICD-10-CM

## 2017-02-11 DIAGNOSIS — E1151 Type 2 diabetes mellitus with diabetic peripheral angiopathy without gangrene: Secondary | ICD-10-CM | POA: Diagnosis not present

## 2017-02-11 DIAGNOSIS — M6281 Muscle weakness (generalized): Secondary | ICD-10-CM | POA: Diagnosis not present

## 2017-02-11 DIAGNOSIS — R1311 Dysphagia, oral phase: Secondary | ICD-10-CM | POA: Diagnosis not present

## 2017-02-11 NOTE — Progress Notes (Signed)
Patient ID: Debra Tran, female   DOB: [birth year], 83 y.o.   MRN: [id number]   Subjective: Patient presents with daughter present to treatment room. Daughter is requesting debridement of mother's toenails which have become progressively thickened, elongated and, deformed over the past 9 months. Patient was last treated in office on 05/01/2016 with a low-grade cellulitis and ulcer on the second right toe which is resolved. Patient was instructed to return a week from that appointment of 05/01/2016, however, is not present until today. Daughter states there were recurrent scabies infections and she did not present to our office  Objective:  Patient does not respond to questioning Patient does not transfers from wheelchair  DP and PT pulses nonpalpable bilaterally Capillary reflex immediate bilaterally Sensation to 10 g monofilament wire intact 1/5 bilaterally Ankle reflexes reactive bilaterally Prominent HAV bunion right HAV bunion left No open skin lesions bilaterally Atrophic skin without hair growth bilaterally Hypertrophic, elongated, deformed, discolored toenails 6-10  Assessment: Diabetic with peripheral neuropathy and peripheral arterial disease Neglected symptomatic mycotic toenails 6-10  Plan: Debridement of toenails 6-10 mechanically and electrically without any bleeding Instructed nursing home to continue applying soft foam pad over right great toe joint daily  Reappoint 3 months

## 2017-02-11 NOTE — Patient Instructions (Addendum)
Okay to continue applying foam pad over right great toe joint daily to reduce pressure on bunion joint Reappoint 3 months for debridement of mycotic toenails or sooner if patient has concern Gean Birchwood DPM  02/11/2017  Diabetes and Foot Care Diabetes may cause you to have problems because of poor blood supply (circulation) to your feet and legs. This may cause the skin on your feet to become thinner, break easier, and heal more slowly. Your skin may become dry, and the skin may peel and crack. You may also have nerve damage in your legs and feet causing decreased feeling in them. You may not notice minor injuries to your feet that could lead to infections or more serious problems. Taking care of your feet is one of the most important things you can do for yourself. Follow these instructions at home:  Wear shoes at all times, even in the house. Do not go barefoot. Bare feet are easily injured.  Check your feet daily for blisters, cuts, and redness. If you cannot see the bottom of your feet, use a mirror or ask someone for help.  Wash your feet with warm water (do not use hot water) and mild soap. Then pat your feet and the areas between your toes until they are completely dry. Do not soak your feet as this can dry your skin.  Apply a moisturizing lotion or petroleum jelly (that does not contain alcohol and is unscented) to the skin on your feet and to dry, brittle toenails. Do not apply lotion between your toes.  Trim your toenails straight across. Do not dig under them or around the cuticle. File the edges of your nails with an emery board or nail file.  Do not cut corns or calluses or try to remove them with medicine.  Wear clean socks or stockings every day. Make sure they are not too tight. Do not wear knee-high stockings since they may decrease blood flow to your legs.  Wear shoes that fit properly and have enough cushioning. To break in new shoes, wear them for just a few hours a  day. This prevents you from injuring your feet. Always look in your shoes before you put them on to be sure there are no objects inside.  Do not cross your legs. This may decrease the blood flow to your feet.  If you find a minor scrape, cut, or break in the skin on your feet, keep it and the skin around it clean and dry. These areas may be cleansed with mild soap and water. Do not cleanse the area with peroxide, alcohol, or iodine.  When you remove an adhesive bandage, be sure not to damage the skin around it.  If you have a wound, look at it several times a day to make sure it is healing.  Do not use heating pads or hot water bottles. They may burn your skin. If you have lost feeling in your feet or legs, you may not know it is happening until it is too late.  Make sure your health care provider performs a complete foot exam at least annually or more often if you have foot problems. Report any cuts, sores, or bruises to your health care provider immediately. Contact a health care provider if:  You have an injury that is not healing.  You have cuts or breaks in the skin.  You have an ingrown nail.  You notice redness on your legs or feet.  You feel burning or  tingling in your legs or feet.  You have pain or cramps in your legs and feet.  Your legs or feet are numb.  Your feet always feel cold. Get help right away if:  There is increasing redness, swelling, or pain in or around a wound.  There is a red line that goes up your leg.  Pus is coming from a wound.  You develop a fever or as directed by your health care provider.  You notice a bad smell coming from an ulcer or wound. This information is not intended to replace advice given to you by your health care provider. Make sure you discuss any questions you have with your health care provider. Document Released: 03/23/2000 Document Revised: 09/01/2015 Document Reviewed: 09/02/2012 Elsevier Interactive Patient Education   2017 [employer].

## 2017-02-12 DIAGNOSIS — M6281 Muscle weakness (generalized): Secondary | ICD-10-CM | POA: Diagnosis not present

## 2017-02-12 DIAGNOSIS — F0391 Unspecified dementia with behavioral disturbance: Secondary | ICD-10-CM | POA: Diagnosis not present

## 2017-02-12 DIAGNOSIS — R1311 Dysphagia, oral phase: Secondary | ICD-10-CM | POA: Diagnosis not present

## 2017-02-13 DIAGNOSIS — R1311 Dysphagia, oral phase: Secondary | ICD-10-CM | POA: Diagnosis not present

## 2017-02-13 DIAGNOSIS — F0391 Unspecified dementia with behavioral disturbance: Secondary | ICD-10-CM | POA: Diagnosis not present

## 2017-02-13 DIAGNOSIS — M6281 Muscle weakness (generalized): Secondary | ICD-10-CM | POA: Diagnosis not present

## 2017-02-14 DIAGNOSIS — R1311 Dysphagia, oral phase: Secondary | ICD-10-CM | POA: Diagnosis not present

## 2017-02-14 DIAGNOSIS — F0391 Unspecified dementia with behavioral disturbance: Secondary | ICD-10-CM | POA: Diagnosis not present

## 2017-02-14 DIAGNOSIS — M6281 Muscle weakness (generalized): Secondary | ICD-10-CM | POA: Diagnosis not present

## 2017-02-15 DIAGNOSIS — F0391 Unspecified dementia with behavioral disturbance: Secondary | ICD-10-CM | POA: Diagnosis not present

## 2017-02-15 DIAGNOSIS — M6281 Muscle weakness (generalized): Secondary | ICD-10-CM | POA: Diagnosis not present

## 2017-02-15 DIAGNOSIS — R1311 Dysphagia, oral phase: Secondary | ICD-10-CM | POA: Diagnosis not present

## 2017-02-18 DIAGNOSIS — E1140 Type 2 diabetes mellitus with diabetic neuropathy, unspecified: Secondary | ICD-10-CM | POA: Diagnosis not present

## 2017-02-18 DIAGNOSIS — R1311 Dysphagia, oral phase: Secondary | ICD-10-CM | POA: Diagnosis not present

## 2017-02-18 DIAGNOSIS — B351 Tinea unguium: Secondary | ICD-10-CM | POA: Diagnosis not present

## 2017-02-18 DIAGNOSIS — M6281 Muscle weakness (generalized): Secondary | ICD-10-CM | POA: Diagnosis not present

## 2017-02-18 DIAGNOSIS — F0391 Unspecified dementia with behavioral disturbance: Secondary | ICD-10-CM | POA: Diagnosis not present

## 2017-02-18 DIAGNOSIS — Z794 Long term (current) use of insulin: Secondary | ICD-10-CM | POA: Diagnosis not present

## 2017-02-19 ENCOUNTER — Other Ambulatory Visit: Payer: Self-pay | Admitting: Dermatology

## 2017-02-19 DIAGNOSIS — F0391 Unspecified dementia with behavioral disturbance: Secondary | ICD-10-CM | POA: Diagnosis not present

## 2017-02-19 DIAGNOSIS — D0439 Carcinoma in situ of skin of other parts of face: Secondary | ICD-10-CM | POA: Diagnosis not present

## 2017-02-19 DIAGNOSIS — R1311 Dysphagia, oral phase: Secondary | ICD-10-CM | POA: Diagnosis not present

## 2017-02-19 DIAGNOSIS — M6281 Muscle weakness (generalized): Secondary | ICD-10-CM | POA: Diagnosis not present

## 2017-02-20 ENCOUNTER — Ambulatory Visit (INDEPENDENT_AMBULATORY_CARE_PROVIDER_SITE_OTHER): Payer: Medicare Other | Admitting: Pulmonary Disease

## 2017-02-20 ENCOUNTER — Other Ambulatory Visit: Payer: Self-pay | Admitting: Pulmonary Disease

## 2017-02-20 ENCOUNTER — Encounter: Payer: Self-pay | Admitting: Pulmonary Disease

## 2017-02-20 ENCOUNTER — Ambulatory Visit (INDEPENDENT_AMBULATORY_CARE_PROVIDER_SITE_OTHER)
Admission: RE | Admit: 2017-02-20 | Discharge: 2017-02-20 | Disposition: A | Discharge status: Home / Self Care | Payer: Medicare Other | Source: Ambulatory Visit | Attending: Pulmonary Disease | Admitting: Pulmonary Disease

## 2017-02-20 VITALS — BP 142/84 | HR 60 | Ht 62.0 in | Wt 171.0 lb

## 2017-02-20 DIAGNOSIS — R059 Cough, unspecified: Secondary | ICD-10-CM

## 2017-02-20 DIAGNOSIS — R05 Cough: Secondary | ICD-10-CM | POA: Diagnosis not present

## 2017-02-20 DIAGNOSIS — F0391 Unspecified dementia with behavioral disturbance: Secondary | ICD-10-CM | POA: Diagnosis not present

## 2017-02-20 DIAGNOSIS — R1311 Dysphagia, oral phase: Secondary | ICD-10-CM | POA: Diagnosis not present

## 2017-02-20 DIAGNOSIS — M6281 Muscle weakness (generalized): Secondary | ICD-10-CM | POA: Diagnosis not present

## 2017-02-20 MED ORDER — CHLORPHENIRAMINE MALEATE 4 MG PO TABS: 4.0000 mg | ORAL_TABLET | Freq: Three times a day (TID) | ORAL | 3 refills | Status: DC

## 2017-02-20 MED ORDER — AZELASTINE-FLUTICASONE 137-50 MCG/ACT NA SUSP: 1.0000 | Freq: Two times a day (BID) | NASAL | 3 refills | Status: DC

## 2017-02-20 MED ORDER — OMEPRAZOLE 20 MG PO CPDR: 20.0000 mg | DELAYED_RELEASE_CAPSULE | Freq: Every day | ORAL | 3 refills | Status: DC

## 2017-02-20 NOTE — Progress Notes (Addendum)
Debra Tran    [id number]    11/17/1927  Primary Care Physician:Carter, Brayton Layman, DO  Referring Physician: Gildardo Cranker, [address], [place] [address]  Chief complaint: Consult for evaluation of chronic cough  HPI: 83 year old with diabetes, hypertension, hyperlipidemia, TIA, allergic rhinitis, skin cancer, dementia.  Complains of chronic cough nonproductive in nature associated with dyspnea and wheezing for the past 1 year.  More recently her cough has become productive.  She had an x-ray at [facility] [I do not have the images to review] and was given antibiotics x2, she is also being treated with Mucinex and DuoNeb's.  She has symptoms of postnasal drip.  Denies any acid reflux. Barium swallow this year noted for mild oral dysphagia. Seen by speech therapy who felt that her cough is not a result of aspiration.  She is on dysphagia diet with nectar thick liquids.  Pets: None no known exposures Occupation:Homemaker, Exposures: No known exposures Smoking history:No smoking history, Travel History: Not significant  Outpatient Encounter Medications as of 02/20/2017  Medication Sig  . acetaminophen (TYLENOL) 325 MG tablet Take 650 mg by mouth every 6 (six) hours as needed.   . AMINO ACIDS-PROTEIN HYDROLYS PO Take 30 mLs by mouth 2 (two) times daily.  Marland Kitchen aspirin EC 325 MG tablet Take 1 tablet (325 mg total) by mouth daily.  Marland Kitchen atorvastatin (LIPITOR) 20 MG tablet Take 1 tablet (20 mg total) by mouth at bedtime.  . benztropine (COGENTIN) 1 MG tablet Take 1 mg by mouth at bedtime.  . Calcium Carbonate-Vitamin D (CALCIUM 600-D) 600-400 MG-UNIT per tablet Take 1 tablet by mouth at bedtime.   . cholecalciferol (VITAMIN D) 1000 UNITS tablet Take 1,000 Units by mouth daily.  . cyanocobalamin 1000 MCG tablet Take 1,000 mcg by mouth at bedtime.   . docusate sodium (COLACE) 100 MG capsule Take 1 capsule (100 mg total) by mouth daily. For constipation  .  donepezil (ARICEPT) 10 MG tablet Take 10 mg by mouth at bedtime.  Marland Kitchen escitalopram (LEXAPRO) 20 MG tablet Take 20 mg by mouth daily.  . fluticasone (FLONASE) 50 MCG/ACT nasal spray Place 1 spray into both nostrils daily.  . Hypromellose 0.4 % SOLN Place 1 drop into both eyes 3 (three) times daily.  . insulin aspart (NOVOLOG) 100 UNIT/ML injection Inject 8 Units into the skin 3 (three) times daily before meals. Notify MD if BS is less than 70 or greater than 450. Hold Insulin if BS is less than 70  . Insulin Detemir (LEVEMIR FLEXPEN) 100 UNIT/ML Pen Inject 10 Units into the skin at bedtime.   Marland Kitchen ipratropium (ATROVENT) 0.03 % nasal spray Place 2 sprays into both nostrils 2 (two) times daily before a meal. And at bedtime  . loratadine (CLARITIN) 10 MG tablet Take 10 mg by mouth daily.  Marland Kitchen losartan (COZAAR) 50 MG tablet Take 50 mg by mouth daily.   . Memantine HCl ER (NAMENDA XR) 28 MG CP24 Take 28 mg by mouth daily. For dementia  . Multiple Vitamins-Minerals (DECUBI-VITE) CAPS Take 1 capsule by mouth daily.  . Nutritional Supplements (NUTRITIONAL SUPPLEMENT PO) Take by mouth. HSG Puree texture, Nectar consistency.  May have Bananas, soft cookies and soft centered sandwiches with distant supervision.  Large portions at meals  . OLANZapine (ZYPREXA) 5 MG tablet Take 5 mg by mouth at bedtime.  . triamcinolone (KENALOG) 0.025 % ointment Apply 1 application topically daily. Apply to arms for skin  integrity   No facility-administered encounter medications on file as of 02/20/2017.     Allergies as of 02/20/2017 - Review Complete 02/20/2017  Allergen Reaction Noted  . Ace inhibitors  06/05/2016  . Ether Nausea And Vomiting 02/03/2013    Past Medical History:  Diagnosis Date  . Allergic rhinitis   . CATARACT 06/06/2006   Qualifier: Diagnosis of  By: Genene Churn MD, Janett Billow    . Depressive disorder   . Diabetes mellitus type 2 in obese ([place])   . Essential hypertension, benign 02/24/2015  . Fungal  dermatitis   . Hyperlipidemia   . Macular degeneration   . Mild cognitive impairment   . Narcissism ([place])   . Normocytic anemia   . Phobia   . Schizophrenia, paranoid ([place])   . Stress incontinence, female     Past Surgical History:  Procedure Laterality Date  . ABDOMINAL HYSTERECTOMY     due to fibroids  . CATARACT EXTRACTION  6 16 2006  . CRYOTHERAPY  2 15 2006   facial AK  . hyperplastic   4 20 2006   AK shave biopsy  . intraocular injection  [id number]  . TOTAL ABDOMINAL HYSTERECTOMY W/ BILATERAL SALPINGOOPHORECTOMY     due to endometriosis  non malignant    Family History  Problem Relation Age of Onset  . Heart disease Mother   . Heart disease Father   . Stroke Father   . Cancer Cousin        liver and colon  . Stroke Sister     Social History   Socioeconomic History  . Marital status: Divorced    Spouse name: Not on file  . Number of children: Not on file  . Years of education: Not on file  . Highest education level: Not on file  Social Needs  . Financial resource strain: Not on file  . Food insecurity - worry: Not on file  . Food insecurity - inability: Not on file  . Transportation needs - medical: Not on file  . Transportation needs - non-medical: Not on file  Occupational History  . Not on file  Tobacco Use  . Smoking status: Former Research scientist (life sciences)  . Smokeless tobacco: Former Systems developer    Types: Chew  Substance and Sexual Activity  . Alcohol use: No  . Drug use: No  . Sexual activity: Not on file  Other Topics Concern  . Not on file  Social History Narrative   Lives in [facility] (assisted living), remotely smoked and chewed tobacco. Daughter([name])  drives her, independent of all ADL's    Review of systems: Review of Systems  Constitutional: Negative for fever and chills.  HENT: Negative.   Eyes: Negative for blurred vision.  Respiratory: as per HPI  Cardiovascular: Negative for chest pain and palpitations.  Gastrointestinal: Negative for  vomiting, diarrhea, blood per rectum. Genitourinary: Negative for dysuria, urgency, frequency and hematuria.  Musculoskeletal: Negative for myalgias, back pain and joint pain.  Skin: Negative for itching and rash.  Neurological: Negative for dizziness, tremors, focal weakness, seizures and loss of consciousness.  Endo/Heme/Allergies: Negative for environmental allergies.  Psychiatric/Behavioral: Negative for depression, suicidal ideas and hallucinations.  All other systems reviewed and are negative.  Physical Exam: Blood pressure (!) 142/84, pulse 60, height 5\' 2"  (1.575 m), weight 171 lb (77.6 kg), SpO2 98 %. Gen:      No acute distress HEENT:  EOMI, sclera anicteric Neck:     No masses; no thyromegaly Lungs:    Clear  to auscultation bilaterally; normal respiratory effort CV:         Regular rate and rhythm; no murmurs Abd:      + bowel sounds; soft, non-tender; no palpable masses, no distension Ext:    No edema; adequate peripheral perfusion Skin:      Warm and dry; no rash Neuro: alert and oriented x 3 Psych: normal mood and affect  Data Reviewed: [facility] 02/20/17-unable to complete Chest x-ray 02/06/16- no active cardiopulmonary disease I have reviewed the images personally  Assessment:  Evaluation for chronic cough Bronchitis Suspect upper airway cough syndrome from postnasal drip, possible GERD and likely as mild aspiration Is already been treated with 2 rounds of antibiotic for bronchitis.  We will avoid further antibiotics and get chest x-ray for evaluation Get reports of previous chest x-ray from nursing home Started on chlorpheniramine antihistamine and Dymista nasal spray.  Start Prilosec for empiric treatment for acid reflux Continue duo nebs as needed and Mucinex for congestion.  Plan/Recommendations: - CXR - Get old CXR reports - Chlorpheniramine 4 mg tid and dymista - Prilosec 20 mg qd - Continue mucinex and duonebs. - Continue aspiration precautions  Marshell Garfinkel MD [facility] Pager [phone] 02/20/2017, 9:30 AM  CC: Gildardo Cranker, DO  Addendum: Chest x-ray 01/10/17 report from [facility] rehab center.  No images to review. No acute cardiopulmonary findings, lungs fields are clear.

## 2017-02-20 NOTE — Patient Instructions (Signed)
We will schedule you for chest x-ray Start chlorpheniramine 4 mg 3 times daily and Dymista nasal spray Continue using the Mucinex and nebs as needed Start Prilosec 20 mg a day Follow-up in 1-2 months.

## 2017-02-21 DIAGNOSIS — F419 Anxiety disorder, unspecified: Secondary | ICD-10-CM | POA: Diagnosis not present

## 2017-02-21 DIAGNOSIS — F259 Schizoaffective disorder, unspecified: Secondary | ICD-10-CM | POA: Diagnosis not present

## 2017-02-21 DIAGNOSIS — F0391 Unspecified dementia with behavioral disturbance: Secondary | ICD-10-CM | POA: Diagnosis not present

## 2017-02-21 DIAGNOSIS — M6281 Muscle weakness (generalized): Secondary | ICD-10-CM | POA: Diagnosis not present

## 2017-02-21 DIAGNOSIS — F329 Major depressive disorder, single episode, unspecified: Secondary | ICD-10-CM | POA: Diagnosis not present

## 2017-02-21 DIAGNOSIS — R1311 Dysphagia, oral phase: Secondary | ICD-10-CM | POA: Diagnosis not present

## 2017-02-22 DIAGNOSIS — F0391 Unspecified dementia with behavioral disturbance: Secondary | ICD-10-CM | POA: Diagnosis not present

## 2017-02-22 DIAGNOSIS — M6281 Muscle weakness (generalized): Secondary | ICD-10-CM | POA: Diagnosis not present

## 2017-02-22 DIAGNOSIS — R1311 Dysphagia, oral phase: Secondary | ICD-10-CM | POA: Diagnosis not present

## 2017-02-23 DIAGNOSIS — R1311 Dysphagia, oral phase: Secondary | ICD-10-CM | POA: Diagnosis not present

## 2017-02-23 DIAGNOSIS — M6281 Muscle weakness (generalized): Secondary | ICD-10-CM | POA: Diagnosis not present

## 2017-02-23 DIAGNOSIS — F0391 Unspecified dementia with behavioral disturbance: Secondary | ICD-10-CM | POA: Diagnosis not present

## 2017-02-25 DIAGNOSIS — F0391 Unspecified dementia with behavioral disturbance: Secondary | ICD-10-CM | POA: Diagnosis not present

## 2017-02-25 DIAGNOSIS — M6281 Muscle weakness (generalized): Secondary | ICD-10-CM | POA: Diagnosis not present

## 2017-02-25 DIAGNOSIS — R1311 Dysphagia, oral phase: Secondary | ICD-10-CM | POA: Diagnosis not present

## 2017-02-26 DIAGNOSIS — F0391 Unspecified dementia with behavioral disturbance: Secondary | ICD-10-CM | POA: Diagnosis not present

## 2017-02-26 DIAGNOSIS — R1311 Dysphagia, oral phase: Secondary | ICD-10-CM | POA: Diagnosis not present

## 2017-02-26 DIAGNOSIS — M6281 Muscle weakness (generalized): Secondary | ICD-10-CM | POA: Diagnosis not present

## 2017-02-27 ENCOUNTER — Encounter (INDEPENDENT_AMBULATORY_CARE_PROVIDER_SITE_OTHER): Payer: Medicare Other | Admitting: Ophthalmology

## 2017-02-27 DIAGNOSIS — F0391 Unspecified dementia with behavioral disturbance: Secondary | ICD-10-CM | POA: Diagnosis not present

## 2017-02-27 DIAGNOSIS — H353231 Exudative age-related macular degeneration, bilateral, with active choroidal neovascularization: Secondary | ICD-10-CM

## 2017-02-27 DIAGNOSIS — H43813 Vitreous degeneration, bilateral: Secondary | ICD-10-CM

## 2017-02-27 DIAGNOSIS — R1311 Dysphagia, oral phase: Secondary | ICD-10-CM | POA: Diagnosis not present

## 2017-02-27 DIAGNOSIS — I10 Essential (primary) hypertension: Secondary | ICD-10-CM

## 2017-02-27 DIAGNOSIS — H35033 Hypertensive retinopathy, bilateral: Secondary | ICD-10-CM | POA: Diagnosis not present

## 2017-02-27 DIAGNOSIS — M6281 Muscle weakness (generalized): Secondary | ICD-10-CM | POA: Diagnosis not present

## 2017-02-28 ENCOUNTER — Encounter: Payer: Self-pay | Admitting: Adult Health

## 2017-02-28 DIAGNOSIS — R053 Chronic cough: Secondary | ICD-10-CM | POA: Insufficient documentation

## 2017-02-28 DIAGNOSIS — R05 Cough: Secondary | ICD-10-CM | POA: Insufficient documentation

## 2017-02-28 DIAGNOSIS — F0391 Unspecified dementia with behavioral disturbance: Secondary | ICD-10-CM | POA: Diagnosis not present

## 2017-02-28 DIAGNOSIS — R1311 Dysphagia, oral phase: Secondary | ICD-10-CM | POA: Diagnosis not present

## 2017-02-28 DIAGNOSIS — M6281 Muscle weakness (generalized): Secondary | ICD-10-CM | POA: Diagnosis not present

## 2017-03-01 DIAGNOSIS — F0391 Unspecified dementia with behavioral disturbance: Secondary | ICD-10-CM | POA: Diagnosis not present

## 2017-03-01 DIAGNOSIS — M6281 Muscle weakness (generalized): Secondary | ICD-10-CM | POA: Diagnosis not present

## 2017-03-01 DIAGNOSIS — R1311 Dysphagia, oral phase: Secondary | ICD-10-CM | POA: Diagnosis not present

## 2017-03-04 DIAGNOSIS — F0391 Unspecified dementia with behavioral disturbance: Secondary | ICD-10-CM | POA: Diagnosis not present

## 2017-03-04 DIAGNOSIS — R1311 Dysphagia, oral phase: Secondary | ICD-10-CM | POA: Diagnosis not present

## 2017-03-04 DIAGNOSIS — M6281 Muscle weakness (generalized): Secondary | ICD-10-CM | POA: Diagnosis not present

## 2017-03-05 DIAGNOSIS — R1311 Dysphagia, oral phase: Secondary | ICD-10-CM | POA: Diagnosis not present

## 2017-03-05 DIAGNOSIS — M6281 Muscle weakness (generalized): Secondary | ICD-10-CM | POA: Diagnosis not present

## 2017-03-05 DIAGNOSIS — F0391 Unspecified dementia with behavioral disturbance: Secondary | ICD-10-CM | POA: Diagnosis not present

## 2017-03-06 DIAGNOSIS — F0391 Unspecified dementia with behavioral disturbance: Secondary | ICD-10-CM | POA: Diagnosis not present

## 2017-03-06 DIAGNOSIS — M6281 Muscle weakness (generalized): Secondary | ICD-10-CM | POA: Diagnosis not present

## 2017-03-06 DIAGNOSIS — R1311 Dysphagia, oral phase: Secondary | ICD-10-CM | POA: Diagnosis not present

## 2017-03-07 DIAGNOSIS — M6281 Muscle weakness (generalized): Secondary | ICD-10-CM | POA: Diagnosis not present

## 2017-03-07 DIAGNOSIS — R1311 Dysphagia, oral phase: Secondary | ICD-10-CM | POA: Diagnosis not present

## 2017-03-07 DIAGNOSIS — F0391 Unspecified dementia with behavioral disturbance: Secondary | ICD-10-CM | POA: Diagnosis not present

## 2017-03-08 ENCOUNTER — Encounter: Payer: Self-pay | Admitting: Adult Health

## 2017-03-08 ENCOUNTER — Non-Acute Institutional Stay (SKILLED_NURSING_FACILITY): Payer: Medicare Other | Admitting: Adult Health

## 2017-03-08 DIAGNOSIS — F0391 Unspecified dementia with behavioral disturbance: Secondary | ICD-10-CM | POA: Diagnosis not present

## 2017-03-08 DIAGNOSIS — J45998 Other asthma: Secondary | ICD-10-CM | POA: Diagnosis not present

## 2017-03-08 DIAGNOSIS — Z794 Long term (current) use of insulin: Secondary | ICD-10-CM

## 2017-03-08 DIAGNOSIS — E119 Type 2 diabetes mellitus without complications: Secondary | ICD-10-CM | POA: Diagnosis not present

## 2017-03-08 DIAGNOSIS — E785 Hyperlipidemia, unspecified: Secondary | ICD-10-CM

## 2017-03-08 DIAGNOSIS — E1169 Type 2 diabetes mellitus with other specified complication: Secondary | ICD-10-CM | POA: Diagnosis not present

## 2017-03-08 DIAGNOSIS — R1311 Dysphagia, oral phase: Secondary | ICD-10-CM | POA: Diagnosis not present

## 2017-03-08 DIAGNOSIS — R1312 Dysphagia, oropharyngeal phase: Secondary | ICD-10-CM

## 2017-03-08 DIAGNOSIS — M6281 Muscle weakness (generalized): Secondary | ICD-10-CM | POA: Diagnosis not present

## 2017-03-08 NOTE — Progress Notes (Signed)
Location:   [facility] Room Number: 225 B Place of Service:  SNF (31)   CODE STATUS: Full Code  Allergies  Allergen Reactions  . Ace Inhibitors   . Ether Nausea And Vomiting    Chief Complaint  Patient presents with  . Medical Management of Chronic Issues    Tremor; dyslipidemia; diabetes; dysphagia     HPI:  She is a 83 year old long term resident of this facility being seen for the management of her chronic illnesses; dysphagia oropharyngeal phase; controlled diabetes mellitus type II without complication; hyperlipidemia; associated with type 2 diabetes mellitus; tardive dyskinesia. She has been seen by pulmonology for her chronic cough. Today she is denying any shortness of breath; no cough; on change in appetite; no changes in sleep pattern. There are no nursing concerns at this time.    Past Medical History:  Diagnosis Date  . Allergic rhinitis   . CATARACT 06/06/2006   Qualifier: Diagnosis of  By: Genene Churn MD, Janett Billow    . Depressive disorder   . Diabetes mellitus type 2 in obese ([place])   . Essential hypertension, benign 02/24/2015  . Fungal dermatitis   . Hyperlipidemia   . Macular degeneration   . Mild cognitive impairment   . Narcissism ([place])   . Normocytic anemia   . Phobia   . Schizophrenia, paranoid ([place])   . Stress incontinence, female     Past Surgical History:  Procedure Laterality Date  . ABDOMINAL HYSTERECTOMY     due to fibroids  . CATARACT EXTRACTION  6 16 2006  . CRYOTHERAPY  2 15 2006   facial AK  . hyperplastic   4 20 2006   AK shave biopsy  . intraocular injection  [id number]  . TOTAL ABDOMINAL HYSTERECTOMY W/ BILATERAL SALPINGOOPHORECTOMY     due to endometriosis  non malignant    Social History   Socioeconomic History  . Marital status: Divorced    Spouse name: Not on file  . Number of children: Not on file  . Years of education: Not on file  . Highest education level: Not on file  Social Needs  . Financial resource  strain: Not on file  . Food insecurity - worry: Not on file  . Food insecurity - inability: Not on file  . Transportation needs - medical: Not on file  . Transportation needs - non-medical: Not on file  Occupational History  . Not on file  Tobacco Use  . Smoking status: Never Smoker  . Smokeless tobacco: Never Used  Substance and Sexual Activity  . Alcohol use: No  . Drug use: No  . Sexual activity: Not on file  Other Topics Concern  . Not on file  Social History Narrative  . Not on file   Family History  Problem Relation Age of Onset  . Heart disease Mother   . Heart disease Father   . Stroke Father   . Cancer Cousin        liver and colon  . Stroke Sister       VITAL SIGNS BP 128/76   Pulse 84   Temp 98.2 F (36.8 C)   Resp 18   Ht 4\' 9"  (1.448 m)   Wt 175 lb 12.8 oz (79.7 kg)   SpO2 99%   BMI 38.04 kg/m   Outpatient Encounter Medications as of 03/08/2017  Medication Sig  . acetaminophen (TYLENOL) 325 MG tablet Take 650 mg by mouth every 6 (six) hours as needed.   Marland Kitchen  AMINO ACIDS-PROTEIN HYDROLYS PO Take 30 mLs by mouth 2 (two) times daily.  Marland Kitchen aspirin EC 325 MG tablet Take 1 tablet (325 mg total) by mouth daily.  Marland Kitchen atorvastatin (LIPITOR) 20 MG tablet Take 1 tablet (20 mg total) by mouth at bedtime.  . benztropine (COGENTIN) 1 MG tablet Take 1 mg by mouth at bedtime.  . Calcium Carbonate-Vitamin D (CALCIUM 600-D) 600-400 MG-UNIT per tablet Take 1 tablet by mouth at bedtime.   . cholecalciferol (VITAMIN D) 1000 UNITS tablet Take 1,000 Units by mouth daily.  . cyanocobalamin 1000 MCG tablet Take 1,000 mcg by mouth at bedtime.   . docusate sodium (COLACE) 100 MG capsule Take 1 capsule (100 mg total) by mouth daily. For constipation  . donepezil (ARICEPT) 10 MG tablet Take 10 mg by mouth at bedtime.  . Hypromellose 0.4 % SOLN Place 1 drop into both eyes 3 (three) times daily.  . insulin aspart (NOVOLOG) 100 UNIT/ML injection Inject 8 Units into the skin 3 (three)  times daily before meals. Notify MD if BS is less than 70 or greater than 450. Hold Insulin if BS is less than 70  . Insulin Detemir (LEVEMIR FLEXPEN) 100 UNIT/ML Pen Inject 10 Units into the skin at bedtime.   Marland Kitchen ipratropium (ATROVENT) 0.03 % nasal spray Place 2 sprays into both nostrils 2 (two) times daily before a meal. And at bedtime  . ipratropium-albuterol (DUONEB) 0.5-2.5 (3) MG/3ML SOLN Take 3 mLs by nebulization every 6 (six) hours as needed.  . loratadine (CLARITIN) 10 MG tablet Take 10 mg by mouth daily.  Marland Kitchen losartan (COZAAR) 50 MG tablet Take 50 mg by mouth daily.   . Memantine HCl ER (NAMENDA XR) 28 MG CP24 Take 28 mg by mouth daily. For dementia  . Multiple Vitamins-Minerals (DECUBI-VITE) CAPS Take 1 capsule by mouth daily.  . Nutritional Supplements (NUTRITIONAL SUPPLEMENT PO) Take by mouth. HSG Puree texture, Nectar consistency.  May have Bananas, soft cookies and soft centered sandwiches with distant supervision.  Large portions at meals  . OLANZapine (ZYPREXA) 5 MG tablet Take 5 mg by mouth at bedtime.  . triamcinolone (KENALOG) 0.025 % ointment Apply 1 application topically daily. Apply to arms for skin integrity  . Azelastine-Fluticasone (DYMISTA) 137-50 MCG/ACT SUSP Place 1 spray 2 (two) times daily into the nose.  . chlorpheniramine (CHLOR-TRIMETON) 4 MG tablet Take 1 tablet (4 mg total) 3 (three) times daily by mouth.  . escitalopram (LEXAPRO) 20 MG tablet Take 20 mg by mouth daily.  . fluticasone (FLONASE) 50 MCG/ACT nasal spray Place 1 spray into both nostrils daily.  . [DISCONTINUED] omeprazole (PRILOSEC) 20 MG capsule Take 1 capsule (20 mg total) daily by mouth. (Patient not taking: Reported on 03/08/2017)   No facility-administered encounter medications on file as of 03/08/2017.      SIGNIFICANT DIAGNOSTIC EXAMS  PREVIOUS  12-30-14: right eye intraocular injection   02-06-16: ct of head: Atrophy and small vessel disease, similar to priors. No acute intracranial  findings.  02-06-16: mri of brain: Atrophy and small vessel disease, similar to priors. No acute stroke is evident.  02-06-16: ct angio of head and neck: CT HEADNo intracranial hemorrhage or CT evidence of large acute infarct. Chronic microvascular changes. No intracranial mass abnormal enhancement. Global atrophy without hydrocephalus.  CTA NECK Plaque right carotid bifurcation and proximal right internal carotid artery with less than 50% diameter narrowing. Mild plaque proximal left internal carotid artery with less than 50% diameter narrowing. Plaque with mild narrowing origin of  vertebral arteries bilaterally. Left vertebral artery slightly dominant size. CTA HEAD Plaque with mild narrowing cavernous segment internal carotid artery bilaterally. Anterior circulation without medium or large size vessel significant stenosis or occlusion. Mild narrowing distal right vertebral artery. No significant stenosis left vertebral artery or basilar artery.    03-12-16: EEG: This is a mildly abnormal EEG due to the presence of  moderate generalized slowing which is indicative of bihemispheric dysfunction which is a nonspecific finding seen in a variety of degenerative, hypoxic, ischemic, toxic, metabolic etiologies. No definite epileptiform activity is noted  12-03-16: chest x-ray: no acute cardiopulmonary disease  TODAY;   01-10-17: chest x-ray: no acute cardiopulmonary disease     LABS REVIEWED: PREVIOUS   03-14-16: urine micro-albumin <1.2  03-27-16: glucose 126; bun 11.7; creat 0.98; k+ 4.5; na++ 143; hgb a1c 7.8  07-02-16: wbc 8.8; hgb 12.5; hct 37.9 ;mcv 85.6; plt 219; glucose 83; bun 22.5; creat 1.12; k+ 4.8; na++ 140; ca 0.1; liver  Normal albumin 3.4  hgb a1c 7.4 chol 148; ldl 75; trig 165; hdl 40  07-25-16: wbc 7.8; hgb 13.0; hct 39.9; mcv 84.9;  glucose 203; bun 15.5; creat 0.73; k+ 4.4 na++ 138; ca 9.1; hgb a1c 7.2  07-26-16: chol 140; ldl 92; trig 142; hdl 43   11-02-16: glucose 198; bun  17.0; creat 0.89; k+ 4.7; na++ 137; liver normal albumin 4.0 hgb a1c 6.9; chol 162; ldl 85; trig 157; hdl 45  NO NEW LABS   Review of Systems  Constitutional: Negative for malaise/fatigue.  Respiratory: Negative for cough and shortness of breath.   Cardiovascular: Negative for chest pain, palpitations and leg swelling.  Gastrointestinal: Negative for abdominal pain, constipation and heartburn.  Musculoskeletal: Negative for back pain, joint pain and myalgias.  Skin: Negative.   Neurological: Negative for dizziness.  Psychiatric/Behavioral: The patient is not nervous/anxious.     Physical Exam  Constitutional: She appears well-developed and well-nourished. No distress.  Neck: No thyromegaly present.  Cardiovascular: Normal rate, regular rhythm and intact distal pulses.  Murmur heard. 1/6  Pulmonary/Chest: Effort normal and breath sounds normal. No respiratory distress.  Abdominal: Soft. Bowel sounds are normal. She exhibits no distension. There is no tenderness.  Musculoskeletal: She exhibits no edema.  Able to move all extremities   Lymphadenopathy:    She has no cervical adenopathy.  Neurological: She is alert.  Skin: Skin is warm and dry. She is not diaphoretic.  Psychiatric: She has a normal mood and affect.    ASSESSMENT/ PLAN:  TODAY:   1. Tremor: stable  she does not have a resting tremor or lip tremor . Will continue cogentin 1 mg nightly   2. Dyslipidemia: stable  ldl 45; trig 162  will continue lipitor 20 mg daily   3. Diabetes stable   hgb a1c 6.9 (previous 7.2)  Will continue levemir 10  units;  novolog  8 units with meals     4. Dysphagia:stable she is on nectar  liquids; no signs of aspiration present     PREVIOUS     5. Gerd: stable will continue prilosec 20  mg daily   6. Constipation stable : will continue colace daily    7. Depression: stable  will continue lexapro 20 mg nightly will monitor   8. Schizophrenia: is stable continue  zyprexa 5  mg  nightly and will monitor   Is followed by psych services.  Will continue cogentin 1 mg nightly for tardive dyskinesia and will monitor   9.  Post nasal drip syndrome: is stable;  will continue atrovent nasal spray twice daily   10. Chronic bronchitis with chronic cough: stable is being followed by pulmonology: will continue dymysta twice daily and chlortrimeton 4 mg three times daily and will monitor    11. Allergic rhinitis: stable  will continue flonase to both nares daily and claritin 10 mg daily   12. TIA: is presently stable; will continue asa 325 mg daily   13. Hypertension: b/p 128/76 is stable :  will continue lisinopril 5 mg daily and cozaar 25 mg daily   14. Dementia without behavior disturbance; is without change will continue aricept 10 mg daily and namenda xr 28 mg daily will not make changes will monitor   Her current weight is 175  pounds and is currently stable.    Will get cbc cmp hgb a1c lipids   MD is aware of resident's narcotic use and is in agreement with current plan of care. We will attempt to wean resident as apropriate     Ok Edwards NP [facility]  Contact [phone] Monday through Friday 8am- 5pm  After hours call [phone]

## 2017-03-11 DIAGNOSIS — R1319 Other dysphagia: Secondary | ICD-10-CM | POA: Diagnosis not present

## 2017-03-11 DIAGNOSIS — F0391 Unspecified dementia with behavioral disturbance: Secondary | ICD-10-CM | POA: Diagnosis not present

## 2017-03-11 DIAGNOSIS — R1312 Dysphagia, oropharyngeal phase: Secondary | ICD-10-CM | POA: Diagnosis not present

## 2017-03-11 DIAGNOSIS — R489 Unspecified symbolic dysfunctions: Secondary | ICD-10-CM | POA: Diagnosis not present

## 2017-03-11 DIAGNOSIS — R1311 Dysphagia, oral phase: Secondary | ICD-10-CM | POA: Diagnosis not present

## 2017-03-11 DIAGNOSIS — M6281 Muscle weakness (generalized): Secondary | ICD-10-CM | POA: Diagnosis not present

## 2017-03-12 DIAGNOSIS — R1312 Dysphagia, oropharyngeal phase: Secondary | ICD-10-CM | POA: Diagnosis not present

## 2017-03-12 DIAGNOSIS — M6281 Muscle weakness (generalized): Secondary | ICD-10-CM | POA: Diagnosis not present

## 2017-03-12 DIAGNOSIS — R489 Unspecified symbolic dysfunctions: Secondary | ICD-10-CM | POA: Diagnosis not present

## 2017-03-12 DIAGNOSIS — F0391 Unspecified dementia with behavioral disturbance: Secondary | ICD-10-CM | POA: Diagnosis not present

## 2017-03-12 DIAGNOSIS — R1319 Other dysphagia: Secondary | ICD-10-CM | POA: Diagnosis not present

## 2017-03-12 DIAGNOSIS — R1311 Dysphagia, oral phase: Secondary | ICD-10-CM | POA: Diagnosis not present

## 2017-03-19 DIAGNOSIS — J45998 Other asthma: Secondary | ICD-10-CM | POA: Insufficient documentation

## 2017-03-26 HISTORY — PX: OTHER SURGICAL HISTORY: SHX169

## 2017-03-27 ENCOUNTER — Encounter (INDEPENDENT_AMBULATORY_CARE_PROVIDER_SITE_OTHER): Payer: Medicare Other | Admitting: Ophthalmology

## 2017-03-27 DIAGNOSIS — I10 Essential (primary) hypertension: Secondary | ICD-10-CM

## 2017-03-27 DIAGNOSIS — H353231 Exudative age-related macular degeneration, bilateral, with active choroidal neovascularization: Secondary | ICD-10-CM | POA: Diagnosis not present

## 2017-03-27 DIAGNOSIS — H35033 Hypertensive retinopathy, bilateral: Secondary | ICD-10-CM | POA: Diagnosis not present

## 2017-03-27 DIAGNOSIS — H43813 Vitreous degeneration, bilateral: Secondary | ICD-10-CM

## 2017-03-29 DIAGNOSIS — E119 Type 2 diabetes mellitus without complications: Secondary | ICD-10-CM | POA: Diagnosis not present

## 2017-03-29 DIAGNOSIS — E785 Hyperlipidemia, unspecified: Secondary | ICD-10-CM | POA: Diagnosis not present

## 2017-03-29 DIAGNOSIS — E118 Type 2 diabetes mellitus with unspecified complications: Secondary | ICD-10-CM | POA: Diagnosis not present

## 2017-03-29 DIAGNOSIS — D649 Anemia, unspecified: Secondary | ICD-10-CM | POA: Diagnosis not present

## 2017-03-29 LAB — CBC AND DIFFERENTIAL
HEMATOCRIT: 39 (ref 36–46)
HEMOGLOBIN: 12.7 (ref 12.0–16.0)
Neutrophils Absolute: 4
PLATELETS: 211 (ref 150–399)
WBC: 6.8

## 2017-03-29 LAB — HEPATIC FUNCTION PANEL
ALK PHOS: 117 (ref 25–125)
ALT: 16 (ref 7–35)
AST: 16 (ref 13–35)
Bilirubin, Total: 0.2

## 2017-03-29 LAB — LIPID PANEL
CHOLESTEROL: 162 (ref 0–200)
HDL: 44 (ref 35–70)
LDL Cholesterol: 75
TRIGLYCERIDES: 214 — AB (ref 40–160)

## 2017-03-29 LAB — BASIC METABOLIC PANEL
BUN: 20 (ref 4–21)
Creatinine: 0.9 (ref 0.5–1.1)
Glucose: 250
Potassium: 4.9 (ref 3.4–5.3)
Sodium: 137 (ref 137–147)

## 2017-03-29 LAB — HEMOGLOBIN A1C: Hemoglobin A1C: 9.4

## 2017-04-02 ENCOUNTER — Emergency Department (HOSPITAL_COMMUNITY): Payer: Medicare Other

## 2017-04-02 ENCOUNTER — Inpatient Hospital Stay (HOSPITAL_COMMUNITY)
Admission: EM | Admit: 2017-04-02 | Discharge: 2017-04-04 | DRG: 071 | Disposition: A | Discharge status: Skilled Nursing Facility | Payer: Medicare Other | Attending: Internal Medicine | Admitting: Internal Medicine

## 2017-04-02 ENCOUNTER — Encounter (HOSPITAL_COMMUNITY): Payer: Self-pay | Admitting: Emergency Medicine

## 2017-04-02 ENCOUNTER — Observation Stay (HOSPITAL_COMMUNITY): Payer: Medicare Other

## 2017-04-02 ENCOUNTER — Other Ambulatory Visit: Payer: Self-pay

## 2017-04-02 DIAGNOSIS — F0150 Vascular dementia without behavioral disturbance: Secondary | ICD-10-CM

## 2017-04-02 DIAGNOSIS — R030 Elevated blood-pressure reading, without diagnosis of hypertension: Secondary | ICD-10-CM | POA: Diagnosis not present

## 2017-04-02 DIAGNOSIS — E119 Type 2 diabetes mellitus without complications: Secondary | ICD-10-CM | POA: Diagnosis present

## 2017-04-02 DIAGNOSIS — F200 Paranoid schizophrenia: Secondary | ICD-10-CM | POA: Diagnosis present

## 2017-04-02 DIAGNOSIS — E785 Hyperlipidemia, unspecified: Secondary | ICD-10-CM | POA: Diagnosis present

## 2017-04-02 DIAGNOSIS — G9340 Encephalopathy, unspecified: Secondary | ICD-10-CM | POA: Diagnosis not present

## 2017-04-02 DIAGNOSIS — Z79899 Other long term (current) drug therapy: Secondary | ICD-10-CM

## 2017-04-02 DIAGNOSIS — I10 Essential (primary) hypertension: Secondary | ICD-10-CM | POA: Diagnosis present

## 2017-04-02 DIAGNOSIS — F0390 Unspecified dementia without behavioral disturbance: Secondary | ICD-10-CM | POA: Diagnosis present

## 2017-04-02 DIAGNOSIS — R1312 Dysphagia, oropharyngeal phase: Secondary | ICD-10-CM | POA: Diagnosis present

## 2017-04-02 DIAGNOSIS — K219 Gastro-esophageal reflux disease without esophagitis: Secondary | ICD-10-CM | POA: Diagnosis present

## 2017-04-02 DIAGNOSIS — Z794 Long term (current) use of insulin: Secondary | ICD-10-CM

## 2017-04-02 DIAGNOSIS — R9431 Abnormal electrocardiogram [ECG] [EKG]: Secondary | ICD-10-CM | POA: Diagnosis not present

## 2017-04-02 DIAGNOSIS — H3530 Unspecified macular degeneration: Secondary | ICD-10-CM | POA: Diagnosis present

## 2017-04-02 DIAGNOSIS — R400 Somnolence: Secondary | ICD-10-CM

## 2017-04-02 DIAGNOSIS — R4020 Unspecified coma: Secondary | ICD-10-CM | POA: Diagnosis not present

## 2017-04-02 DIAGNOSIS — R918 Other nonspecific abnormal finding of lung field: Secondary | ICD-10-CM | POA: Diagnosis not present

## 2017-04-02 DIAGNOSIS — Z888 Allergy status to other drugs, medicaments and biological substances status: Secondary | ICD-10-CM

## 2017-04-02 LAB — BASIC METABOLIC PANEL
Anion gap: 6 (ref 5–15)
BUN: 20 mg/dL (ref 6–20)
CHLORIDE: 99 mmol/L — AB (ref 101–111)
CO2: 33 mmol/L — AB (ref 22–32)
CREATININE: 1.1 mg/dL — AB (ref 0.44–1.00)
Calcium: 9 mg/dL (ref 8.9–10.3)
GFR calc non Af Amer: 44 mL/min — ABNORMAL LOW (ref >60)
GFR, EST AFRICAN AMERICAN: 51 mL/min — AB (ref >60)
Glucose, Bld: 175 mg/dL — ABNORMAL HIGH (ref 65–99)
POTASSIUM: 4.2 mmol/L (ref 3.5–5.1)
SODIUM: 138 mmol/L (ref 135–145)

## 2017-04-02 LAB — CBC WITH DIFFERENTIAL/PLATELET
BASOS PCT: 1 %
Basophils Absolute: 0 10*3/uL (ref 0.0–0.1)
EOS ABS: 0.2 10*3/uL (ref 0.0–0.7)
Eosinophils Relative: 2 %
HEMATOCRIT: 44.1 % (ref 36.0–46.0)
HEMOGLOBIN: 14.2 g/dL (ref 12.0–15.0)
LYMPHS ABS: 2.8 10*3/uL (ref 0.7–4.0)
Lymphocytes Relative: 34 %
MCH: 27.7 pg (ref 26.0–34.0)
MCHC: 32.2 g/dL (ref 30.0–36.0)
MCV: 86 fL (ref 78.0–100.0)
MONOS PCT: 6 %
Monocytes Absolute: 0.5 10*3/uL (ref 0.1–1.0)
NEUTROS ABS: 4.9 10*3/uL (ref 1.7–7.7)
NEUTROS PCT: 57 %
Platelets: 213 10*3/uL (ref 150–400)
RBC: 5.13 MIL/uL — AB (ref 3.87–5.11)
RDW: 13.5 % (ref 11.5–15.5)
WBC: 8.5 10*3/uL (ref 4.0–10.5)

## 2017-04-02 LAB — MRSA PCR SCREENING: MRSA by PCR: NEGATIVE

## 2017-04-02 LAB — BLOOD GAS, VENOUS
ACID-BASE EXCESS: 4.9 mmol/L — AB (ref 0.0–2.0)
BICARBONATE: 30.7 mmol/L — AB (ref 20.0–28.0)
Drawn by: 422461
FIO2: 21
O2 SAT: 71.4 %
PATIENT TEMPERATURE: 98.6
PH VEN: 7.375 (ref 7.250–7.430)
pCO2, Ven: 53.9 mmHg (ref 44.0–60.0)
pO2, Ven: 38.8 mmHg (ref 32.0–45.0)

## 2017-04-02 LAB — HEPATIC FUNCTION PANEL
ALBUMIN: 3.1 g/dL — AB (ref 3.5–5.0)
ALT: 18 U/L (ref 14–54)
AST: 25 U/L (ref 15–41)
Alkaline Phosphatase: 87 U/L (ref 38–126)
Bilirubin, Direct: 0.2 mg/dL (ref 0.1–0.5)
Indirect Bilirubin: 0.5 mg/dL (ref 0.3–0.9)
Total Bilirubin: 0.7 mg/dL (ref 0.3–1.2)
Total Protein: 6.5 g/dL (ref 6.5–8.1)

## 2017-04-02 LAB — URINALYSIS, ROUTINE W REFLEX MICROSCOPIC
BACTERIA UA: NONE SEEN
BILIRUBIN URINE: NEGATIVE
GLUCOSE, UA: NEGATIVE mg/dL
HGB URINE DIPSTICK: NEGATIVE
KETONES UR: NEGATIVE mg/dL
LEUKOCYTES UA: NEGATIVE
NITRITE: NEGATIVE
PROTEIN: NEGATIVE mg/dL
Specific Gravity, Urine: 1.012 (ref 1.005–1.030)
Squamous Epithelial / LPF: NONE SEEN
pH: 7 (ref 5.0–8.0)

## 2017-04-02 LAB — AMMONIA: Ammonia: 14 umol/L (ref 9–35)

## 2017-04-02 LAB — I-STAT TROPONIN, ED: TROPONIN I, POC: 0 ng/mL (ref 0.00–0.08)

## 2017-04-02 LAB — GLUCOSE, CAPILLARY
Glucose-Capillary: 109 mg/dL — ABNORMAL HIGH (ref 65–99)
Glucose-Capillary: 132 mg/dL — ABNORMAL HIGH (ref 65–99)
Glucose-Capillary: 148 mg/dL — ABNORMAL HIGH (ref 65–99)

## 2017-04-02 LAB — CBG MONITORING, ED: GLUCOSE-CAPILLARY: 143 mg/dL — AB (ref 65–99)

## 2017-04-02 MED ORDER — MEMANTINE HCL ER 28 MG PO CP24: 28.0000 mg | ORAL_CAPSULE | Freq: Every day | ORAL | Status: DC

## 2017-04-02 MED ORDER — ENOXAPARIN SODIUM 40 MG/0.4ML ~~LOC~~ SOLN
40.0000 mg | SUBCUTANEOUS | Status: DC
  Administered 2017-04-02 – 2017-04-03 (×2): 40 mg via SUBCUTANEOUS
  Filled 2017-04-02: qty 0.4

## 2017-04-02 MED ORDER — CETAPHIL MOISTURIZING EX LOTN
1.0000 "application " | TOPICAL_LOTION | Freq: Two times a day (BID) | CUTANEOUS | Status: DC
  Administered 2017-04-03 – 2017-04-04 (×3): 1 via TOPICAL
  Filled 2017-04-02 (×2): qty 118

## 2017-04-02 MED ORDER — PANTOPRAZOLE SODIUM 40 MG PO TBEC: 40.0000 mg | DELAYED_RELEASE_TABLET | Freq: Every day | ORAL | Status: DC

## 2017-04-02 MED ORDER — IOPAMIDOL (ISOVUE-370) INJECTION 76%
100.0000 mL | Freq: Once | INTRAVENOUS | Status: AC | PRN
  Administered 2017-04-02: 80 mL via INTRAVENOUS

## 2017-04-02 MED ORDER — OLANZAPINE 5 MG PO TABS: 5.0000 mg | ORAL_TABLET | Freq: Every day | ORAL | Status: DC

## 2017-04-02 MED ORDER — ACETAMINOPHEN 325 MG PO TABS: 650.0000 mg | ORAL_TABLET | Freq: Four times a day (QID) | ORAL | Status: DC | PRN

## 2017-04-02 MED ORDER — DOCUSATE SODIUM 100 MG PO CAPS: 100.0000 mg | ORAL_CAPSULE | Freq: Every day | ORAL | Status: DC

## 2017-04-02 MED ORDER — DECUBI-VITE PO CAPS: 1.0000 | ORAL_CAPSULE | Freq: Every day | ORAL | Status: DC

## 2017-04-02 MED ORDER — ONDANSETRON HCL 4 MG/2ML IJ SOLN: 4.0000 mg | Freq: Four times a day (QID) | INTRAMUSCULAR | Status: DC | PRN

## 2017-04-02 MED ORDER — VITAMIN B-12 1000 MCG PO TABS: 1000.0000 ug | ORAL_TABLET | Freq: Every day | ORAL | Status: DC

## 2017-04-02 MED ORDER — POLYVINYL ALCOHOL 1.4 % OP SOLN
1.0000 [drp] | Freq: Three times a day (TID) | OPHTHALMIC | Status: DC
  Administered 2017-04-02 – 2017-04-04 (×6): 1 [drp] via OPHTHALMIC
  Filled 2017-04-02: qty 15

## 2017-04-02 MED ORDER — BISACODYL 10 MG RE SUPP: 10.0000 mg | Freq: Every day | RECTAL | Status: DC | PRN

## 2017-04-02 MED ORDER — ATORVASTATIN CALCIUM 20 MG PO TABS: 20.0000 mg | ORAL_TABLET | Freq: Every day | ORAL | Status: DC

## 2017-04-02 MED ORDER — STROKE: EARLY STAGES OF RECOVERY BOOK
Freq: Once | Status: DC
  Filled 2017-04-02: qty 1

## 2017-04-02 MED ORDER — INSULIN DETEMIR 100 UNIT/ML ~~LOC~~ SOLN
6.0000 [IU] | Freq: Every day | SUBCUTANEOUS | Status: DC
  Administered 2017-04-02 – 2017-04-03 (×2): 6 [IU] via SUBCUTANEOUS
  Filled 2017-04-02 (×4): qty 0.06

## 2017-04-02 MED ORDER — LOSARTAN POTASSIUM 50 MG PO TABS: 50.0000 mg | ORAL_TABLET | Freq: Every day | ORAL | Status: DC

## 2017-04-02 MED ORDER — HYDRALAZINE HCL 20 MG/ML IJ SOLN
5.0000 mg | INTRAMUSCULAR | Status: DC | PRN
  Administered 2017-04-03: 10 mg via INTRAVENOUS
  Filled 2017-04-02 (×2): qty 1

## 2017-04-02 MED ORDER — VITAMIN D3 25 MCG (1000 UNIT) PO TABS: 1000.0000 [IU] | ORAL_TABLET | Freq: Every day | ORAL | Status: DC

## 2017-04-02 MED ORDER — ACETAMINOPHEN 650 MG RE SUPP: 650.0000 mg | Freq: Four times a day (QID) | RECTAL | Status: DC | PRN

## 2017-04-02 MED ORDER — IOPAMIDOL (ISOVUE-370) INJECTION 76%
INTRAVENOUS | Status: AC
  Filled 2017-04-02: qty 100

## 2017-04-02 MED ORDER — SODIUM CHLORIDE 0.9 % IV SOLN
INTRAVENOUS | Status: DC
  Administered 2017-04-02 – 2017-04-03 (×2): via INTRAVENOUS

## 2017-04-02 MED ORDER — FLEET ENEMA 7-19 GM/118ML RE ENEM
1.0000 | ENEMA | RECTAL | Status: DC | PRN
Start: 2017-04-02 — End: 2017-04-04

## 2017-04-02 MED ORDER — ASPIRIN 325 MG PO TABS
325.0000 mg | ORAL_TABLET | Freq: Every day | ORAL | Status: DC
  Administered 2017-04-03 – 2017-04-04 (×2): 325 mg via ORAL
  Filled 2017-04-02 (×2): qty 1

## 2017-04-02 MED ORDER — INSULIN ASPART 100 UNIT/ML ~~LOC~~ SOLN: 0.0000 [IU] | Freq: Three times a day (TID) | SUBCUTANEOUS | Status: DC

## 2017-04-02 MED ORDER — ESCITALOPRAM OXALATE 10 MG PO TABS: 20.0000 mg | ORAL_TABLET | Freq: Every day | ORAL | Status: DC

## 2017-04-02 MED ORDER — IPRATROPIUM-ALBUTEROL 0.5-2.5 (3) MG/3ML IN SOLN: 3.0000 mL | Freq: Four times a day (QID) | RESPIRATORY_TRACT | Status: DC | PRN

## 2017-04-02 MED ORDER — ONDANSETRON HCL 4 MG PO TABS: 4.0000 mg | ORAL_TABLET | Freq: Four times a day (QID) | ORAL | Status: DC | PRN

## 2017-04-02 MED ORDER — ASPIRIN 300 MG RE SUPP
300.0000 mg | Freq: Every day | RECTAL | Status: DC
  Administered 2017-04-02: 300 mg via RECTAL
  Filled 2017-04-02 (×3): qty 1

## 2017-04-02 MED ORDER — ASPIRIN EC 325 MG PO TBEC: 325.0000 mg | DELAYED_RELEASE_TABLET | Freq: Every day | ORAL | Status: DC

## 2017-04-02 MED ORDER — ACETAMINOPHEN 325 MG PO TABS
650.0000 mg | ORAL_TABLET | Freq: Four times a day (QID) | ORAL | Status: DC | PRN
Start: 2017-04-02 — End: 2017-04-04

## 2017-04-02 MED ORDER — PRO-STAT SUGAR FREE PO LIQD: 30.0000 mL | Freq: Two times a day (BID) | ORAL | Status: DC

## 2017-04-02 MED ORDER — INSULIN DETEMIR 100 UNIT/ML FLEXPEN: 10.0000 [IU] | PEN_INJECTOR | Freq: Every day | SUBCUTANEOUS | Status: DC

## 2017-04-02 MED ORDER — IPRATROPIUM BROMIDE 0.03 % NA SOLN
2.0000 | Freq: Three times a day (TID) | NASAL | Status: DC
  Administered 2017-04-02 – 2017-04-04 (×5): 2 via NASAL
  Filled 2017-04-02: qty 30

## 2017-04-02 MED ORDER — IPRATROPIUM BROMIDE 0.03 % NA SOLN: 2.0000 | Freq: Three times a day (TID) | NASAL | Status: DC

## 2017-04-02 MED ORDER — LORATADINE 10 MG PO TABS: 10.0000 mg | ORAL_TABLET | Freq: Every day | ORAL | Status: DC

## 2017-04-02 MED ORDER — INSULIN ASPART 100 UNIT/ML ~~LOC~~ SOLN
0.0000 [IU] | SUBCUTANEOUS | Status: DC
  Administered 2017-04-02 (×3): 1 [IU] via SUBCUTANEOUS
  Administered 2017-04-03: 3 [IU] via SUBCUTANEOUS
  Administered 2017-04-03: 1 [IU] via SUBCUTANEOUS
  Administered 2017-04-03: 2 [IU] via SUBCUTANEOUS
  Administered 2017-04-04: 3 [IU] via SUBCUTANEOUS
  Administered 2017-04-04: 2 [IU] via SUBCUTANEOUS

## 2017-04-02 MED ORDER — BENZTROPINE MESYLATE 1 MG PO TABS: 1.0000 mg | ORAL_TABLET | Freq: Every day | ORAL | Status: DC

## 2017-04-02 MED ORDER — CALCIUM CARBONATE-VITAMIN D 600-400 MG-UNIT PO TABS: 1.0000 | ORAL_TABLET | Freq: Every day | ORAL | Status: DC

## 2017-04-02 MED ORDER — DONEPEZIL HCL 5 MG PO TABS: 10.0000 mg | ORAL_TABLET | Freq: Every day | ORAL | Status: DC

## 2017-04-02 NOTE — ED Triage Notes (Signed)
Pt arriving from [place] with hypertension. Pt responding to verbal stimuli. Pt denies any pain or any other complaints at this time.   180/100 Hr 54 CBG 120

## 2017-04-02 NOTE — H&P (Signed)
History and Physical    Debra Tran GXQ:[id number] DOB: 1928/01/29 DOA: 04/02/2017  PCP: Gildardo Cranker, DO  Patient coming from: SNF  I have personally briefly reviewed patient's old medical records in [place]  Chief Complaint: HTN, AMS  HPI: Debra Tran is a 83 y.o. female with medical history significant of HTN, DM2, dementia, paranoid schizophrenia.  Patient was sent in to ED from SNF this evening due to AMS.  She is normally quite verbal, they noted she was poorly responsive this evening.  Apparently became more responsive when EMS arrived, but became less responsive again in ED.   ED Course: Patient initially difficult to arouse, only eye opening and purposeful movements to pain initially.  No focal deficits so code stroke not called.  Work up negative other than BPs are labile and tend to be running on the higher side (180/100).  CT head neg.  UA neg, VBG just shows compensated chronic resp acidosis.  Patient has now started to have improving mental status, is now waking up.  Obeying commands, best GCS is 15 at this time.  Still not back to baseline however.   Review of Systems: Unable to perform due to AMS  Past Medical History:  Diagnosis Date  . Allergic rhinitis   . CATARACT 06/06/2006   Qualifier: Diagnosis of  By: Genene Churn MD, Janett Billow    . Depressive disorder   . Diabetes mellitus type 2 in obese ([place])   . Essential hypertension, benign 02/24/2015  . Fungal dermatitis   . Hyperlipidemia   . Macular degeneration   . Mild cognitive impairment   . Narcissism ([place])   . Normocytic anemia   . Phobia   . Schizophrenia, paranoid ([place])   . Stress incontinence, female     Past Surgical History:  Procedure Laterality Date  . ABDOMINAL HYSTERECTOMY     due to fibroids  . Avastin Ingection Right 03/26/2017   In Right eye  . CATARACT EXTRACTION  6 16 2006  . CRYOTHERAPY  2 15 2006   facial AK  . hyperplastic   4 20 2006   AK shave biopsy  .  intraocular injection  [id number]  . TOTAL ABDOMINAL HYSTERECTOMY W/ BILATERAL SALPINGOOPHORECTOMY     due to endometriosis  non malignant     reports that  has never smoked. she has never used smokeless tobacco. She reports that she does not drink alcohol or use drugs.  Allergies  Allergen Reactions  . Ace Inhibitors     Unknown reaction per MAR   . Ether Nausea And Vomiting    Family History  Problem Relation Age of Onset  . Heart disease Mother   . Heart disease Father   . Stroke Father   . Cancer Cousin        liver and colon  . Stroke Sister      Prior to Admission medications   Medication Sig Start Date End Date Taking? Authorizing Provider  acetaminophen (TYLENOL) 325 MG tablet Take 650 mg by mouth every 6 (six) hours as needed for mild pain, moderate pain or fever.    Yes [provider]  Amino Acids-Protein Hydrolys (FEEDING SUPPLEMENT, PRO-STAT SUGAR FREE 64,) LIQD Take 30 mLs by mouth 2 (two) times daily.   Yes [provider]  aspirin EC 325 MG tablet Take 1 tablet (325 mg total) by mouth daily. 02/06/16  Yes Sherwood Gambler, MD  atorvastatin (LIPITOR) 20 MG tablet Take 1 tablet (20 mg total) by  mouth at bedtime. 08/24/11  Yes Clovis Cao, MD  benztropine (COGENTIN) 1 MG tablet Take 1 mg by mouth at bedtime.   Yes [provider]  Calcium Carbonate-Vitamin D (CALCIUM 600-D) 600-400 MG-UNIT per tablet Take 1 tablet by mouth at bedtime.    Yes [provider]  cholecalciferol (VITAMIN D) 1000 UNITS tablet Take 1,000 Units by mouth at bedtime.    Yes [provider]  cyanocobalamin 1000 MCG tablet Take 1,000 mcg by mouth daily with breakfast.    Yes [provider]  docusate sodium (COLACE) 100 MG capsule Take 1 capsule (100 mg total) by mouth daily. For constipation Patient taking differently: Take 100 mg by mouth daily with breakfast. For constipation 09/15/14  Yes Gerlene Fee, NP  donepezil (ARICEPT) 10 MG  tablet Take 10 mg by mouth at bedtime.   Yes [provider]  escitalopram (LEXAPRO) 20 MG tablet Take 20 mg by mouth daily with breakfast.    Yes [provider]  Hypromellose 0.4 % SOLN Place 1 drop into both eyes 3 (three) times daily.   Yes [provider]  insulin aspart (NOVOLOG) 100 UNIT/ML injection Inject 8 Units into the skin 3 (three) times daily after meals. Notify MD if BS is less than 70 or greater than 450. Hold Insulin if BS is less than 70   Yes [provider]  Insulin Detemir (LEVEMIR FLEXPEN) 100 UNIT/ML Pen Inject 10 Units into the skin at bedtime.    Yes [provider]  ipratropium (ATROVENT) 0.03 % nasal spray Place 2 sprays into both nostrils at bedtime. And at bedtime    Yes [provider]  ipratropium (ATROVENT) 0.03 % nasal spray Place 2 sprays into both nostrils 3 (three) times daily before meals.   Yes [provider]  ipratropium-albuterol (DUONEB) 0.5-2.5 (3) MG/3ML SOLN Take 3 mLs by nebulization every 6 (six) hours as needed (for shortness of breath).  02/08/17  Yes [provider]  loratadine (CLARITIN) 10 MG tablet Take 10 mg by mouth daily with breakfast.    Yes [provider]  losartan (COZAAR) 50 MG tablet Take 50 mg by mouth daily with breakfast.    Yes [provider]  [facility] LOTN Apply 1 application topically 2 (two) times daily.   Yes [provider]  Memantine HCl ER (NAMENDA XR) 28 MG CP24 Take 28 mg by mouth daily with breakfast. For dementia   Yes [provider]  Multiple Vitamins-Minerals (DECUBI-VITE) CAPS Take 1 capsule by mouth daily with breakfast.    Yes [provider]  OLANZapine (ZYPREXA) 5 MG tablet Take 5 mg by mouth at bedtime.   Yes [provider]  omeprazole (PRILOSEC) 20 MG capsule Take 20 mg by mouth daily at 6 (six) AM.   Yes [provider]    Physical Exam: Vitals:   04/02/17 0146 04/02/17  0252 04/02/17 0400 04/02/17 0430  BP: (!) 152/53 (!) 163/36 (!) 121/40 (!) 146/48  Pulse: (!) 50 (!) 50 (!) 46 (!) 48  Resp: 11 11 12 11   Temp: (!) 96.5 F (35.8 C)     TempSrc: Axillary     SpO2: 99% 99% 100% 100%    Constitutional: NAD, calm, comfortable Eyes: Pupils are equal, though has irregular pupil particularly on left, likely due to prior cataract surgery. ENMT: Mucous membranes are moist. Posterior pharynx clear of any exudate or lesions.Normal dentition.  Neck: normal, supple, no masses, no thyromegaly Respiratory: clear  to auscultation bilaterally, no wheezing, no crackles. Normal respiratory effort. No accessory muscle use.  Cardiovascular: Regular rate and rhythm, no murmurs / rubs / gallops. No extremity edema. 2+ pedal pulses. No carotid bruits.  Abdomen: no tenderness, no masses palpated. No hepatosplenomegaly. Bowel sounds positive.  Musculoskeletal: no clubbing / cyanosis. No joint deformity upper and lower extremities. Good ROM, no contractures. Normal muscle tone.  Skin: no rashes, lesions, ulcers. No induration Neurologic: MAE Psychiatric: Blunted affect, still lethargic and falls asleep easily.  Does obey commands, GCS 15 now.   Labs on Admission: I have personally reviewed following labs and imaging studies  CBC: Recent Labs  Lab 04/02/17 0140  WBC 8.5  NEUTROABS 4.9  HGB 14.2  HCT 44.1  MCV 86.0  PLT 287   Basic Metabolic Panel: Recent Labs  Lab 04/02/17 0140  NA 138  K 4.2  CL 99*  CO2 33*  GLUCOSE 175*  BUN 20  CREATININE 1.10*  CALCIUM 9.0   GFR: CrCl cannot be calculated (Unknown ideal weight.). Liver Function Tests: Recent Labs  Lab 04/02/17 0244  AST 25  ALT 18  ALKPHOS 87  BILITOT 0.7  PROT 6.5  ALBUMIN 3.1*   No results for input(s): LIPASE, AMYLASE in the last 168 hours. Recent Labs  Lab 04/02/17 0244  AMMONIA 14   Coagulation Profile: No results for input(s): INR, PROTIME in the last 168 hours. Cardiac  Enzymes: No results for input(s): CKTOTAL, CKMB, CKMBINDEX, TROPONINI in the last 168 hours. BNP (last 3 results) No results for input(s): PROBNP in the last 8760 hours. HbA1C: No results for input(s): HGBA1C in the last 72 hours. CBG: No results for input(s): GLUCAP in the last 168 hours. Lipid Profile: No results for input(s): CHOL, HDL, LDLCALC, TRIG, CHOLHDL, LDLDIRECT in the last 72 hours. Thyroid Function Tests: No results for input(s): TSH, T4TOTAL, FREET4, T3FREE, THYROIDAB in the last 72 hours. Anemia Panel: No results for input(s): VITAMINB12, FOLATE, FERRITIN, TIBC, IRON, RETICCTPCT in the last 72 hours. Urine analysis:    Component Value Date/Time   COLORURINE YELLOW 04/02/2017 [place] 04/02/2017 0239   LABSPEC 1.012 04/02/2017 0239   PHURINE 7.0 04/02/2017 0239   GLUCOSEU NEGATIVE 04/02/2017 0239   HGBUR NEGATIVE 04/02/2017 0239   BILIRUBINUR NEGATIVE 04/02/2017 0239   BILIRUBINUR negative 05/29/2012 1117   KETONESUR NEGATIVE 04/02/2017 0239   PROTEINUR NEGATIVE 04/02/2017 0239   UROBILINOGEN 1.0 08/14/2014 2214   NITRITE NEGATIVE 04/02/2017 0239   LEUKOCYTESUR NEGATIVE 04/02/2017 0239    Radiological Exams on Admission: Ct Head Wo Contrast  Result Date: 04/02/2017 CLINICAL DATA:  Acute onset of altered level of consciousness. EXAM: CT HEAD WITHOUT CONTRAST TECHNIQUE: Contiguous axial images were obtained from the base of the skull through the vertex without intravenous contrast. COMPARISON:  CTA of the head, and MRI of the brain performed 02/06/2016 FINDINGS: Brain: No evidence of acute infarction, hemorrhage, hydrocephalus, extra-axial collection or mass lesion/mass effect. Prominence of the ventricles and sulci reflects mild to moderate cortical volume loss. Mild cerebellar atrophy is noted. Scattered periventricular and subcortical matter change likely reflects small vessel ischemic microangiopathy. The brainstem and fourth ventricle are within  normal limits. The basal ganglia are unremarkable in appearance. The cerebral hemispheres demonstrate grossly normal gray-white differentiation. No mass effect or midline shift is seen. Vascular: No hyperdense vessel or unexpected calcification. Skull: There is no evidence of fracture; visualized osseous structures are unremarkable in appearance. Sinuses/Orbits: The orbits are within normal limits. There is  partial opacification of the mastoid air cells bilaterally. The paranasal sinuses are well-aerated. Other: No significant soft tissue abnormalities are seen. IMPRESSION: 1. No acute intracranial pathology seen on CT. 2. Mild to moderate cortical volume loss and scattered small vessel ischemic microangiopathy. 3. Partial opacification of the mastoid air cells bilaterally. Electronically Signed   By: Garald Balding M.D.   On: 04/02/2017 03:50   Dg Chest Port 1 View  Result Date: 04/02/2017 CLINICAL DATA:  Altered mental status EXAM: PORTABLE CHEST 1 VIEW COMPARISON:  Chest radiograph 02/20/2017 FINDINGS: Retrocardiac left lung base opacities, likely atelectasis. No pleural effusion or pneumothorax. No pulmonary edema. IMPRESSION: No active disease. Electronically Signed   By: Ulyses Jarred M.D.   On: 04/02/2017 02:56    EKG: Independently reviewed.  Assessment/Plan Principal Problem:   Acute encephalopathy Active Problems:   Controlled diabetes mellitus type II without complication (HCC)   Dementia without behavioral disturbance   Essential hypertension, benign   Dysphagia, oropharyngeal phase    1. Acute encephalopathy - 1. Unclear cause 2. But does seem to be improving, now able to wake up, best GCS is 15. 3. CTA head and neck ordered stat to rule out large vessel occlusion 4. MRI brain ordered 5. Stroke neuro checks ordered, although patient doesn't have any focal findings per say.  Insidious onset of symptoms and unclear LKW, not candidate for TPA.  Getting STAT CTA head and neck to r/o  large vessel occlusion that could be intervened on by NIR. 6. ASA 7. Due to AMS, will keep NPO off of home meds for now till she passes swallow screen / SLP eval 1. Of note I think she fails bedside swallow anyhow as she has h/o dysphagia 2. Dysphagia - 1. At baseline is on Pureed diet with nectar thick liquids 3. DM2 - 1. Will Reduce levemir to 6 units QHS while NPO 2. Sensitive SSI Q4H while NPO 4. Dementia - 1. Chronic 2. Continue home meds when able to take these PO 5. HTN - 1. PO meds are once again on hold for the moment 2. Continue losartan if able to take POs and she rules out for stroke.  DVT prophylaxis: Lovenox Code Status: Full code for now Family Communication: Daughter at bedside Disposition Plan: SNF after admit Consults called: None Admission status: Place in [place], [place] [facility] Pager [phone]  If 7AM-7PM, please contact day team taking care of patient [email or web address] Password [facility]  04/02/2017, 6:03 AM

## 2017-04-02 NOTE — Progress Notes (Signed)
Pt admitted after midnight. For details, please refer to admission note done 04/02/2017. Pt admitted from SNF for AMS. MRI brain is pending.  Leisa Lenz [facility] [phone]

## 2017-04-02 NOTE — ED Provider Notes (Signed)
[facility] DEPT Provider Note   CSN: [id number] Arrival date & time: 04/02/17  0115     History   Chief Complaint Chief Complaint  Patient presents with  . Hypertension    HPI Debra Tran is a 83 y.o. female.  The history is provided by the nursing home and a relative. The history is limited by the condition of the patient (Altered mental status).  She has history of paranoid schizophrenia hyperlipidemia, dementia, hypertension and was sent from nursing home because of altered mental status.  She is normally quite verbal and they noted that she was poorly responsive this evening.  Apparently, she became more responsive when EMS arrived, but she is completely nonverbal now.  There is no history of fall.  Past Medical History:  Diagnosis Date  . Allergic rhinitis   . CATARACT 06/06/2006   Qualifier: Diagnosis of  By: Genene Churn MD, Janett Billow    . Depressive disorder   . Diabetes mellitus type 2 in obese ([place])   . Essential hypertension, benign 02/24/2015  . Fungal dermatitis   . Hyperlipidemia   . Macular degeneration   . Mild cognitive impairment   . Narcissism ([place])   . Normocytic anemia   . Phobia   . Schizophrenia, paranoid ([place])   . Stress incontinence, female     Patient Active Problem List   Diagnosis Date Noted  . Chronic allergic bronchitis 03/19/2017  . Chronic cough 02/28/2017  . Healthcare associated bacterial pneumonia 02/01/2017  . Post-nasal drip 01/05/2017  . Dysphagia, oropharyngeal phase 12/06/2016  . Chronic nonseasonal allergic rhinitis due to pollen 11/02/2016  . Seborrhea corporis 11/02/2016  . TIA (transient ischemic attack) 02/27/2016  . Nonmelanoma skin cancer 07/18/2015  . Essential hypertension, benign 02/24/2015  . Stress incontinence 11/29/2014  . Tardive dyskinesia 09/15/2014  . Chronic constipation 07/03/2014  . GERD (gastroesophageal reflux disease) 05/11/2014  . Depressive disorder   . Dementia  without behavioral disturbance 12/12/2011  . ARTHRITIS 09/20/2009  . Controlled diabetes mellitus type II without complication ([place]) [id number]/63/[id number]  . Hyperlipidemia associated with type 2 diabetes mellitus ([place]) 06/06/2006  . Schizophrenia ([place]) 06/06/2006    Past Surgical History:  Procedure Laterality Date  . ABDOMINAL HYSTERECTOMY     due to fibroids  . Avastin Ingection Right 03/26/2017   In Right eye  . CATARACT EXTRACTION  6 16 2006  . CRYOTHERAPY  2 15 2006   facial AK  . hyperplastic   4 20 2006   AK shave biopsy  . intraocular injection  [id number]  . TOTAL ABDOMINAL HYSTERECTOMY W/ BILATERAL SALPINGOOPHORECTOMY     due to endometriosis  non malignant    OB History    No data available       Home Medications    Prior to Admission medications   Medication Sig Start Date End Date Taking? Authorizing Provider  acetaminophen (TYLENOL) 325 MG tablet Take 650 mg by mouth every 6 (six) hours as needed for mild pain, moderate pain or fever.    Yes [provider]  Amino Acids-Protein Hydrolys (FEEDING SUPPLEMENT, PRO-STAT SUGAR FREE 64,) LIQD Take 30 mLs by mouth 2 (two) times daily.   Yes [provider]  aspirin EC 325 MG tablet Take 1 tablet (325 mg total) by mouth daily. 02/06/16  Yes Sherwood Gambler, MD  atorvastatin (LIPITOR) 20 MG tablet Take 1 tablet (20 mg total) by mouth at bedtime. 08/24/11  Yes Clovis Cao, MD  benztropine (COGENTIN) 1 MG tablet  Take 1 mg by mouth at bedtime.   Yes [provider]  Calcium Carbonate-Vitamin D (CALCIUM 600-D) 600-400 MG-UNIT per tablet Take 1 tablet by mouth at bedtime.    Yes [provider]  cholecalciferol (VITAMIN D) 1000 UNITS tablet Take 1,000 Units by mouth at bedtime.    Yes [provider]  cyanocobalamin 1000 MCG tablet Take 1,000 mcg by mouth daily with breakfast.    Yes [provider]  docusate sodium (COLACE) 100 MG capsule Take 1 capsule (100 mg total) by mouth  daily. For constipation Patient taking differently: Take 100 mg by mouth daily with breakfast. For constipation 09/15/14  Yes Gerlene Fee, NP  donepezil (ARICEPT) 10 MG tablet Take 10 mg by mouth at bedtime.   Yes [provider]  escitalopram (LEXAPRO) 20 MG tablet Take 20 mg by mouth daily with breakfast.    Yes [provider]  Hypromellose 0.4 % SOLN Place 1 drop into both eyes 3 (three) times daily.   Yes [provider]  insulin aspart (NOVOLOG) 100 UNIT/ML injection Inject 8 Units into the skin 3 (three) times daily after meals. Notify MD if BS is less than 70 or greater than 450. Hold Insulin if BS is less than 70   Yes [provider]  Insulin Detemir (LEVEMIR FLEXPEN) 100 UNIT/ML Pen Inject 10 Units into the skin at bedtime.    Yes [provider]  ipratropium (ATROVENT) 0.03 % nasal spray Place 2 sprays into both nostrils at bedtime. And at bedtime    Yes [provider]  ipratropium (ATROVENT) 0.03 % nasal spray Place 2 sprays into both nostrils 3 (three) times daily before meals.   Yes [provider]  ipratropium-albuterol (DUONEB) 0.5-2.5 (3) MG/3ML SOLN Take 3 mLs by nebulization every 6 (six) hours as needed (for shortness of breath).  02/08/17  Yes [provider]  loratadine (CLARITIN) 10 MG tablet Take 10 mg by mouth daily with breakfast.    Yes [provider]  losartan (COZAAR) 50 MG tablet Take 50 mg by mouth daily with breakfast.    Yes [provider]  [facility] LOTN Apply 1 application topically 2 (two) times daily.   Yes [provider]  Memantine HCl ER (NAMENDA XR) 28 MG CP24 Take 28 mg by mouth daily with breakfast. For dementia   Yes [provider]  Multiple Vitamins-Minerals (DECUBI-VITE) CAPS Take 1 capsule by mouth daily with breakfast.    Yes [provider]  OLANZapine (ZYPREXA) 5 MG tablet Take 5 mg by mouth at bedtime.   Yes [provider]  omeprazole (PRILOSEC) 20 MG capsule Take 20 mg by mouth daily at 6 (six) AM.   Yes [provider]  Azelastine-Fluticasone (DYMISTA) 137-50 MCG/ACT SUSP Place 1 spray 2 (two) times daily into the nose. Patient not taking: Reported on 04/02/2017 02/20/17   Marshell Garfinkel, MD  chlorpheniramine (CHLOR-TRIMETON) 4 MG tablet Take 1 tablet (4 mg total) 3 (three) times daily by mouth. Patient not taking: Reported on 04/02/2017 02/20/17   Marshell Garfinkel, MD    Family History Family History  Problem Relation Age of Onset  . Heart disease Mother   . Heart disease Father   . Stroke Father   . Cancer Cousin        liver and colon  . Stroke Sister     Social History Social History   Tobacco Use  . Smoking status: Never Smoker  . Smokeless tobacco:  Never Used  Substance Use Topics  . Alcohol use: No  . Drug use: No     Allergies   Ace inhibitors and Ether   Review of Systems Review of Systems  Unable to perform ROS: Mental status change     Physical Exam Updated Vital Signs BP (!) 152/53 (BP Location: Left Arm)   Pulse (!) 50   Temp (!) 96.5 F (35.8 C) (Axillary)   Resp 11   SpO2 99%   Physical Exam  Nursing note and vitals reviewed.  83 year old female, resting comfortably and in no acute distress. Vital signs are significant for hypertension and bradycardia. Oxygen saturation is 99%, which is normal. Head is normocephalic and atraumatic. PERRLA, EOMI. Oropharynx is clear.  Mucous membranes are dry. Neck is nontender and supple without adenopathy or JVD. Back is nontender and there is no CVA tenderness. Lungs are clear without rales, wheezes, or rhonchi. Chest is nontender. Heart has regular rate and rhythm without murmur. Abdomen is soft, flat, nontender without masses or hepatosplenomegaly and peristalsis is normoactive. Extremities have no cyanosis or edema, full range of motion is present. Skin is warm and dry without rash. Neurologic:  She responds to painful stimuli but not to verbal stimuli, cranial nerves are grossly intact, there are no gross motor or sensory deficits.  She moves all extremities equally in response to pain.  ED Treatments / Results  Labs (all labs ordered are listed, but only abnormal results are displayed) Labs Reviewed  CBC WITH DIFFERENTIAL/PLATELET - Abnormal; Notable for the following components:      Result Value   RBC 5.13 (*)    All other components within normal limits  BASIC METABOLIC PANEL - Abnormal; Notable for the following components:   Chloride 99 (*)    CO2 33 (*)    Glucose, Bld 175 (*)    Creatinine, Ser 1.10 (*)    GFR calc non Af Amer 44 (*)    GFR calc Af Amer 51 (*)    All other components within normal limits  HEPATIC FUNCTION PANEL - Abnormal; Notable for the following components:   Albumin 3.1 (*)    All other components within normal limits  BLOOD GAS, VENOUS - Abnormal; Notable for the following components:   Bicarbonate 30.7 (*)    Acid-Base Excess 4.9 (*)    All other components within normal limits  CBG MONITORING, ED - Abnormal; Notable for the following components:   Glucose-Capillary 143 (*)    All other components within normal limits  AMMONIA  URINALYSIS, ROUTINE W REFLEX MICROSCOPIC  I-STAT TROPONIN, ED    Radiology Ct Angio Head W Or Wo Contrast  Result Date: 04/02/2017 CLINICAL DATA:  Acute encephalopathy, waxing and waning. Evaluate for large vessel occlusion. Unknown LKN.Comorbidities include diabetes mellitus type II, hypertension, and dementia. EXAM: CT ANGIOGRAPHY HEAD AND NECK TECHNIQUE: Multidetector CT imaging of the head and neck was performed using the standard protocol during bolus administration of intravenous contrast. Multiplanar CT image reconstructions and MIPs were obtained to evaluate the vascular anatomy. Carotid stenosis measurements (when applicable) are obtained utilizing NASCET criteria, using the distal internal carotid diameter  as the denominator. CONTRAST:  17mL ISOVUE-370 IOPAMIDOL (ISOVUE-370) INJECTION 76% COMPARISON:  CT head earlier today.  CTA 02/06/2016. FINDINGS: CTA NECK Aortic arch: Standard branching. Imaged portion shows no evidence of aneurysm or dissection. No significant stenosis of the major arch vessel origins. Moderate calcification. Right carotid system: Calcific plaque, similar to priors. No evidence of  dissection, stenosis (50% or greater) or occlusion. Left carotid system: Calcific plaque, similar priors. No evidence of dissection, stenosis (50% or greater) or occlusion. Vertebral arteries: LEFT vertebral slightly larger, both patent. No significant ostial narrowing. No evidence of dissection, stenosis (50% or greater) or occlusion. Nonvascular soft tissues:  Noncontributory. CTA HEAD Anterior circulation: Calcification of the cavernous internal carotid arteries consistent with cerebrovascular atherosclerotic disease. No signs of intracranial large vessel occlusion. No significant stenosis, proximal occlusion, aneurysm, or vascular malformation. Posterior circulation: Both vertebrals contribute to formation of the basilar. Posterior cerebral arteries and cerebellar arteries appear unremarkable. No significant stenosis, proximal occlusion, aneurysm, or vascular malformation. Venous sinuses: As permitted by contrast timing, patent. Anatomic variants: None of significance. Delayed phase:   No abnormal intracranial enhancement. Review of the MIP images confirms the above findings IMPRESSION: Minor extracranial and intracranial atheromatous change, not unexpected for age, and stable from 2017. No large vessel occlusion, dissection, or other significant vascular finding. Atrophy and small vessel disease without acute intracranial process. Electronically Signed   By: Staci Righter M.D.   On: 04/02/2017 08:00   Ct Head Wo Contrast  Result Date: 04/02/2017 CLINICAL DATA:  Acute onset of altered level of consciousness.  EXAM: CT HEAD WITHOUT CONTRAST TECHNIQUE: Contiguous axial images were obtained from the base of the skull through the vertex without intravenous contrast. COMPARISON:  CTA of the head, and MRI of the brain performed 02/06/2016 FINDINGS: Brain: No evidence of acute infarction, hemorrhage, hydrocephalus, extra-axial collection or mass lesion/mass effect. Prominence of the ventricles and sulci reflects mild to moderate cortical volume loss. Mild cerebellar atrophy is noted. Scattered periventricular and subcortical matter change likely reflects small vessel ischemic microangiopathy. The brainstem and fourth ventricle are within normal limits. The basal ganglia are unremarkable in appearance. The cerebral hemispheres demonstrate grossly normal gray-white differentiation. No mass effect or midline shift is seen. Vascular: No hyperdense vessel or unexpected calcification. Skull: There is no evidence of fracture; visualized osseous structures are unremarkable in appearance. Sinuses/Orbits: The orbits are within normal limits. There is partial opacification of the mastoid air cells bilaterally. The paranasal sinuses are well-aerated. Other: No significant soft tissue abnormalities are seen. IMPRESSION: 1. No acute intracranial pathology seen on CT. 2. Mild to moderate cortical volume loss and scattered small vessel ischemic microangiopathy. 3. Partial opacification of the mastoid air cells bilaterally. Electronically Signed   By: Garald Balding M.D.   On: 04/02/2017 03:50   Ct Angio Neck W And/or Wo Contrast  Result Date: 04/02/2017 CLINICAL DATA:  Acute encephalopathy, waxing and waning. Evaluate for large vessel occlusion. Unknown LKN.Comorbidities include diabetes mellitus type II, hypertension, and dementia. EXAM: CT ANGIOGRAPHY HEAD AND NECK TECHNIQUE: Multidetector CT imaging of the head and neck was performed using the standard protocol during bolus administration of intravenous contrast. Multiplanar CT image  reconstructions and MIPs were obtained to evaluate the vascular anatomy. Carotid stenosis measurements (when applicable) are obtained utilizing NASCET criteria, using the distal internal carotid diameter as the denominator. CONTRAST:  80mL ISOVUE-370 IOPAMIDOL (ISOVUE-370) INJECTION 76% COMPARISON:  CT head earlier today.  CTA 02/06/2016. FINDINGS: CTA NECK Aortic arch: Standard branching. Imaged portion shows no evidence of aneurysm or dissection. No significant stenosis of the major arch vessel origins. Moderate calcification. Right carotid system: Calcific plaque, similar to priors. No evidence of dissection, stenosis (50% or greater) or occlusion. Left carotid system: Calcific plaque, similar priors. No evidence of dissection, stenosis (50% or greater) or occlusion. Vertebral arteries: LEFT vertebral slightly larger, both  patent. No significant ostial narrowing. No evidence of dissection, stenosis (50% or greater) or occlusion. Nonvascular soft tissues:  Noncontributory. CTA HEAD Anterior circulation: Calcification of the cavernous internal carotid arteries consistent with cerebrovascular atherosclerotic disease. No signs of intracranial large vessel occlusion. No significant stenosis, proximal occlusion, aneurysm, or vascular malformation. Posterior circulation: Both vertebrals contribute to formation of the basilar. Posterior cerebral arteries and cerebellar arteries appear unremarkable. No significant stenosis, proximal occlusion, aneurysm, or vascular malformation. Venous sinuses: As permitted by contrast timing, patent. Anatomic variants: None of significance. Delayed phase:   No abnormal intracranial enhancement. Review of the MIP images confirms the above findings IMPRESSION: Minor extracranial and intracranial atheromatous change, not unexpected for age, and stable from 2017. No large vessel occlusion, dissection, or other significant vascular finding. Atrophy and small vessel disease without acute  intracranial process. Electronically Signed   By: Staci Righter M.D.   On: 04/02/2017 08:00   Dg Chest Port 1 View  Result Date: 04/02/2017 CLINICAL DATA:  Altered mental status EXAM: PORTABLE CHEST 1 VIEW COMPARISON:  Chest radiograph 02/20/2017 FINDINGS: Retrocardiac left lung base opacities, likely atelectasis. No pleural effusion or pneumothorax. No pulmonary edema. IMPRESSION: No active disease. Electronically Signed   By: Ulyses Jarred M.D.   On: 04/02/2017 02:56    Procedures Procedures (including critical care time)  Medications Ordered in ED Medications  Hypromellose 0.4 % SOLN 1 drop (not administered)  ipratropium (ATROVENT) 0.03 % nasal spray 2 spray (not administered)  ipratropium (ATROVENT) 0.03 % nasal spray 2 spray (not administered)  ipratropium-albuterol (DUONEB) 0.5-2.5 (3) MG/3ML nebulizer solution 3 mL (not administered)  Lotion Base LOTN 1 application (not administered)  acetaminophen (TYLENOL) tablet 650 mg (not administered)    Or  acetaminophen (TYLENOL) suppository 650 mg (not administered)  ondansetron (ZOFRAN) tablet 4 mg (not administered)    Or  ondansetron (ZOFRAN) injection 4 mg (not administered)  enoxaparin (LOVENOX) injection 40 mg (not administered)  iopamidol (ISOVUE-370) 76 % injection (not administered)  insulin aspart (novoLOG) injection 0-9 Units (not administered)  aspirin suppository 300 mg (not administered)    Or  aspirin tablet 325 mg (not administered)  Insulin Detemir (LEVEMIR) FlexPen 6 Units (not administered)  iopamidol (ISOVUE-370) 76 % injection 100 mL (80 mLs Intravenous Contrast Given 04/02/17 0614)     Initial Impression / Assessment and Plan / ED Course  I have reviewed the triage vital signs and the nursing notes.  Pertinent labs & imaging results that were available during my care of the patient were reviewed by me and considered in my medical decision making (see chart for details).  Altered mental status of uncertain  cause. Initial laboratory workup is unremarkable.  She will be sent for CT of head as well as check urinalysis and chest x-ray.  Old records are reviewed, and she has no relevant past visits.  CT is unremarkable as is urinalysis and laboratory workup.  Patient did not improve mental state with observation.  Case is discussed with Dr. Alcario Drought of [facility] hospitalists agrees to admit the patient and requests CT angiogram be obtained of head and neck and also arterial blood gas.  Arterial blood gas did not show evidence of CO2 narcosis.  CT angiograms are negative.  She probably should have MRI to rule out stroke, but this is unlikely.  Also, of note, patient did momentarily wake up and was conversant, but then became somnolent again.  Also, blood pressure was noted be quite labile going as high as 086 systolic  and as low as 209 systolic.  Final Clinical Impressions(s) / ED Diagnoses   Final diagnoses:  Somnolence    ED Discharge Orders    None       Delora Fuel, MD 19/80/22 [phone]

## 2017-04-02 NOTE — ED Notes (Signed)
Bed: HY07 Expected date:  Expected time:  Means of arrival:  Comments: 88 F from facility HTN

## 2017-04-03 DIAGNOSIS — H3530 Unspecified macular degeneration: Secondary | ICD-10-CM | POA: Diagnosis present

## 2017-04-03 DIAGNOSIS — Z794 Long term (current) use of insulin: Secondary | ICD-10-CM | POA: Diagnosis not present

## 2017-04-03 DIAGNOSIS — Z79899 Other long term (current) drug therapy: Secondary | ICD-10-CM | POA: Diagnosis not present

## 2017-04-03 DIAGNOSIS — Z888 Allergy status to other drugs, medicaments and biological substances status: Secondary | ICD-10-CM | POA: Diagnosis not present

## 2017-04-03 DIAGNOSIS — F0391 Unspecified dementia with behavioral disturbance: Secondary | ICD-10-CM | POA: Diagnosis not present

## 2017-04-03 DIAGNOSIS — R1311 Dysphagia, oral phase: Secondary | ICD-10-CM | POA: Diagnosis not present

## 2017-04-03 DIAGNOSIS — G9340 Encephalopathy, unspecified: Secondary | ICD-10-CM | POA: Diagnosis not present

## 2017-04-03 DIAGNOSIS — K219 Gastro-esophageal reflux disease without esophagitis: Secondary | ICD-10-CM | POA: Diagnosis present

## 2017-04-03 DIAGNOSIS — M6281 Muscle weakness (generalized): Secondary | ICD-10-CM | POA: Diagnosis not present

## 2017-04-03 DIAGNOSIS — F0390 Unspecified dementia without behavioral disturbance: Secondary | ICD-10-CM | POA: Diagnosis present

## 2017-04-03 DIAGNOSIS — R1319 Other dysphagia: Secondary | ICD-10-CM | POA: Diagnosis not present

## 2017-04-03 DIAGNOSIS — R489 Unspecified symbolic dysfunctions: Secondary | ICD-10-CM | POA: Diagnosis not present

## 2017-04-03 DIAGNOSIS — R1312 Dysphagia, oropharyngeal phase: Secondary | ICD-10-CM | POA: Diagnosis present

## 2017-04-03 DIAGNOSIS — F200 Paranoid schizophrenia: Secondary | ICD-10-CM | POA: Diagnosis present

## 2017-04-03 DIAGNOSIS — I10 Essential (primary) hypertension: Secondary | ICD-10-CM | POA: Diagnosis present

## 2017-04-03 DIAGNOSIS — R4182 Altered mental status, unspecified: Secondary | ICD-10-CM | POA: Diagnosis not present

## 2017-04-03 DIAGNOSIS — E119 Type 2 diabetes mellitus without complications: Secondary | ICD-10-CM | POA: Diagnosis present

## 2017-04-03 DIAGNOSIS — E785 Hyperlipidemia, unspecified: Secondary | ICD-10-CM | POA: Diagnosis present

## 2017-04-03 LAB — BASIC METABOLIC PANEL
Anion gap: 9 (ref 5–15)
BUN: 17 mg/dL (ref 6–20)
CHLORIDE: 104 mmol/L (ref 101–111)
CO2: 25 mmol/L (ref 22–32)
CREATININE: 0.87 mg/dL (ref 0.44–1.00)
Calcium: 8.4 mg/dL — ABNORMAL LOW (ref 8.9–10.3)
GFR calc Af Amer: >60 mL/min (ref >60)
GFR calc non Af Amer: 58 mL/min — ABNORMAL LOW (ref >60)
GLUCOSE: 108 mg/dL — AB (ref 65–99)
Potassium: 4.3 mmol/L (ref 3.5–5.1)
SODIUM: 138 mmol/L (ref 135–145)

## 2017-04-03 LAB — GLUCOSE, CAPILLARY
GLUCOSE-CAPILLARY: 110 mg/dL — AB (ref 65–99)
GLUCOSE-CAPILLARY: 138 mg/dL — AB (ref 65–99)
GLUCOSE-CAPILLARY: 248 mg/dL — AB (ref 65–99)
GLUCOSE-CAPILLARY: 174 mg/dL — AB (ref 65–99)
Glucose-Capillary: 104 mg/dL — ABNORMAL HIGH (ref 65–99)
Glucose-Capillary: 128 mg/dL — ABNORMAL HIGH (ref 65–99)

## 2017-04-03 MED ORDER — RESOURCE THICKENUP CLEAR PO POWD
ORAL | Status: DC | PRN
  Filled 2017-04-03: qty 125

## 2017-04-03 NOTE — Evaluation (Signed)
Clinical/Bedside Swallow Evaluation Patient Details  Name: Debra Tran MRN: [id number] Date of Birth: 1927/12/01  Today's Date: 04/03/2017 Time: SLP Start Time (ACUTE ONLY): 85 SLP Stop Time (ACUTE ONLY): 1605 SLP Time Calculation (min) (ACUTE ONLY): 35 min  Past Medical History:  Past Medical History:  Diagnosis Date  . Allergic rhinitis   . CATARACT 06/06/2006   Qualifier: Diagnosis of  By: Genene Churn MD, Janett Billow    . Depressive disorder   . Diabetes mellitus type 2 in obese ([place])   . Essential hypertension, benign 02/24/2015  . Fungal dermatitis   . Hyperlipidemia   . Macular degeneration   . Mild cognitive impairment   . Narcissism ([place])   . Normocytic anemia   . Phobia   . Schizophrenia, paranoid ([place])   . Stress incontinence, female    Past Tran History:  Past Tran History:  Procedure Laterality Date  . ABDOMINAL HYSTERECTOMY     due to fibroids  . Avastin Ingection Right 03/26/2017   In Right eye  . CATARACT EXTRACTION  6 16 2006  . CRYOTHERAPY  2 15 2006   facial AK  . hyperplastic   4 20 2006   AK shave biopsy  . intraocular injection  [id number]  . TOTAL ABDOMINAL HYSTERECTOMY W/ BILATERAL SALPINGOOPHORECTOMY     due to endometriosis  non malignant   HPI:  Debra Tran is a 83 y.o. female with medical history significant of HTN, DM2, dysphagia, allergic rhinitis, narcissim, dementia, paranoid schizophrenia who resides at [facility]  Patient was sent in to ED from SNF due to [place].  She is normally quite verbal, they noted she was poorly responsive this evening.   MRI 04/02/17 showed atrophy, MBS 12/2016 completed showing mild oral deficits and trace penetration of liquids - rec dys1/nectar diet.  CXR done 12/25 was negative.  Swallow evaluation ordered.  Pt on dys1/nectar and allowed soft sandwiches, cookies, bananas.     Assessment / Plan / Recommendation Clinical Impression  Patient swallow function is likely consistent with findings from [facility] 12/2016.   She does not have focal cranial nerve deficits but voice is mildly hoarse and sounds audibly wet at times.  She is observed to become mildly dyspneic during intake - suspect due to her COPD.  Pt observed with intake of cracker, puree, nectar and thin.  Difficulty orally manipulating solids noted - likely due to lack of dentition and dementia.   No indication of aspiration with any po observed.  SLP recommends to continue dys1/necter diet with precautions - being mindful of respiratory demands.  Will follow up clinically for family/pt education and tolerance.  Of note, pt appears hydrated and well nourished causing SLP to suspect adequate intake at SNF despite dietary restrictions.  Thanks for this order.   SLP Visit Diagnosis: Dysphagia, oral phase (R13.11)    Aspiration Risk  Mild aspiration risk    Diet Recommendation Dysphagia 1 (Puree);Nectar-thick liquid   Liquid Administration via: Cup;No straw Medication Administration: Whole meds with puree Supervision: Staff to assist with self feeding Compensations: Minimize environmental distractions;Slow rate;Follow solids with liquid Postural Changes: Seated upright at 90 degrees;Remain upright for at least 30 minutes after po intake    Other  Recommendations Oral Care Recommendations: Oral care BID Other Recommendations: Order thickener from pharmacy;Clarify dietary restrictions   Follow up Recommendations        Frequency and Duration min 1 x/week  1 week       Prognosis Prognosis for Safe Diet Advancement: Fair  Barriers to Reach Goals: Time post onset      Swallow Study   General Date of Onset: 04/03/17 HPI: [name] is a 83 y.o. female with medical history significant of HTN, DM2, dysphagia, allergic rhinitis, narcissim, dementia, paranoid schizophrenia who resides at [facility].  Patient was sent in to ED from SNF due to [employer].  She is normally quite verbal, they noted she was poorly responsive this evening.   MRI 04/02/17 showed  atrophy, MBS 12/2016 completed showing mild oral deficits and trace penetration of liquids - rec dys1/nectar diet.  CXR done 12/25 was negative.  Swallow evaluation ordered.  Pt on dys1/nectar and allowed soft sandwiches, cookies, bananas.   Type of Study: Bedside Swallow Evaluation Previous Swallow Assessment: MBS - see HPI Diet Prior to this Study: Dysphagia 1 (puree);Nectar-thick liquids Temperature Spikes Noted: No Respiratory Status: Room air History of Recent Intubation: No Behavior/Cognition: Alert;Cooperative Oral Cavity Assessment: Within Functional Limits Oral Care Completed by SLP: No Oral Cavity - Dentition: Edentulous;Dentures, not available Vision: Functional for self-feeding(with assist) Self-Feeding Abilities: Able to feed self Patient Positioning: Upright in bed Baseline Vocal Quality: Normal Volitional Cough: Weak Volitional Swallow: Unable to elicit(pt attempted however)    Oral/Motor/Sensory Function Overall Oral Motor/Sensory Function: Generalized oral weakness   Ice Chips Ice chips: Not tested   Thin Liquid Thin Liquid: Within functional limits Presentation: Cup;Self Fed    Nectar Thick Nectar Thick Liquid: Within functional limits Presentation: Cup;Self Fed   Honey Thick Honey Thick Liquid: Not tested   Puree Puree: Within functional limits Presentation: Spoon   Solid   GO   Solid: Impaired Presentation: Self Fed Oral Phase Impairments: Reduced lingual movement/coordination;Impaired mastication Oral Phase Functional Implications: Other (comment);Impaired mastication;Oral residue Other Comments: pt did not have dentures in her room and reportedly uses them to eat -     Functional Assessment Tool Used: clinical judgement Functional Limitations: Swallowing Swallow Current Status (O2703): At least 20 percent but less than 40 percent impaired, limited or restricted Swallow Goal Status [phone]): At least 1 percent but less than 20 percent impaired, limited or  restricted   Macario Golds 04/03/2017,4:08 PM Luanna Salk, [place] [facility] SLP [phone]

## 2017-04-03 NOTE — Progress Notes (Signed)
Patient ID: Debra Tran, female   DOB: 09/12/1927, 83 y.o.   MRN: [id number]  PROGRESS NOTE    FABIAN COCA  HDQ:[id number] DOB: 09-Feb-1928 DOA: 04/02/2017  PCP: Gildardo Cranker, DO   Brief Narrative:  83 y.o. female with medical history significant of HTN, DM2, dementia, paranoid schizophrenia.  Patient was sent in to ED from SNF this evening due to AMS.  She is normally quite verbal, they noted she was poorly responsive.  Apparently became more responsive when EMS arrived, but became less responsive again in ED. CT angio head and CT head did not show acute findings other than minor extracranial and intracranial atheromatous change, not unexpected for age, and stable from 2017. No large vessel occlusion, dissection, or other significant vascular finding. MRI brain showed atrophy and small vessel disease.  No acute intracranial findings.  Assessment & Plan:   Principal Problem:   Acute encephalopathy / Dementia without behavioral disturbance - Unclear etiology - CT head and MRI brain without acute findings - CXR without acute findings  - PT eval pending  Active Problems:   Controlled diabetes mellitus type II without complication (HCC) - Continue Lantus 6 units at bedtime and SSI    Essential hypertension, benign - Use hydralazine PRN   DVT prophylaxis: Lovenox subQ Code Status: full code  Family Communication: daughter at the bedside Disposition Plan: D/C in am   Consultants:   PT  Procedures:   None   Antimicrobials:   None    Subjective: No overnight events.  Objective: Vitals:   04/02/17 1956 04/03/17 0432 04/03/17 0457 04/03/17 0635  BP: 127/63 (!) 205/128 (!) 154/110 (!) 164/51  Pulse: (!) 54 (!) 51    Resp: 17 20    Temp: 98.1 F (36.7 C) (!) 97.3 F (36.3 C)    TempSrc: Oral Oral    SpO2: 100% 100%    Weight:      Height:        Intake/Output Summary (Last 24 hours) at 04/03/2017 1046 Last data filed at 04/03/2017 0200 Gross per 24  hour  Intake 551.67 ml  Output -  Net 551.67 ml   Filed Weights   04/02/17 1036 04/02/17 1820  Weight: 78.6 kg (173 lb 4.5 oz) 78.6 kg (173 lb 4.5 oz)    Examination:  General exam: Appears calm and comfortable  Respiratory system: no wheezing, no rhonchi  Cardiovascular system: S1 & S2 heard, RRR.  Gastrointestinal system: Abdomen is nondistended, soft and nontender. No organomegaly or masses felt. Normal bowel sounds heard. Central nervous system: More alert, no focal deficit Extremities: no tenderness, palpable pulses  Skin: warm, dry Psychiatry: No agitation or restlessness   Data Reviewed: I have personally reviewed following labs and imaging studies  CBC: Recent Labs  Lab 03/29/17 04/02/17 0140  WBC 6.8 8.5  NEUTROABS 4 4.9  HGB 12.7 14.2  HCT 39 44.1  MCV  --  86.0  PLT 211 119   Basic Metabolic Panel: Recent Labs  Lab 03/29/17 04/02/17 0140 04/03/17 0418  NA 137 138 138  K 4.9 4.2 4.3  CL  --  99* 104  CO2  --  33* 25  GLUCOSE  --  175* 108*  BUN 20 20 17   CREATININE 0.9 1.10* 0.87  CALCIUM  --  9.0 8.4*   GFR: Estimated Creatinine Clearance: 49.2 mL/min (by C-G formula based on SCr of 0.87 mg/dL). Liver Function Tests: Recent Labs  Lab 03/29/17 04/02/17 0244  AST 16 25  ALT 16 18  ALKPHOS 117 87  BILITOT  --  0.7  PROT  --  6.5  ALBUMIN  --  3.1*   No results for input(s): LIPASE, AMYLASE in the last 168 hours. Recent Labs  Lab 04/02/17 0244  AMMONIA 14   Coagulation Profile: No results for input(s): INR, PROTIME in the last 168 hours. Cardiac Enzymes: No results for input(s): CKTOTAL, CKMB, CKMBINDEX, TROPONINI in the last 168 hours. BNP (last 3 results) No results for input(s): PROBNP in the last 8760 hours. HbA1C: No results for input(s): HGBA1C in the last 72 hours. CBG: Recent Labs  Lab 04/02/17 1614 04/02/17 1950 04/02/17 2359 04/03/17 0421 04/03/17 0723  GLUCAP 132* 148* 109* 104* 110*   Lipid Profile: No results  for input(s): CHOL, HDL, LDLCALC, TRIG, CHOLHDL, LDLDIRECT in the last 72 hours. Thyroid Function Tests: No results for input(s): TSH, T4TOTAL, FREET4, T3FREE, THYROIDAB in the last 72 hours. Anemia Panel: No results for input(s): VITAMINB12, FOLATE, FERRITIN, TIBC, IRON, RETICCTPCT in the last 72 hours. Urine analysis:    Component Value Date/Time   COLORURINE YELLOW 04/02/2017 [place] 04/02/2017 0239   LABSPEC 1.012 04/02/2017 0239   PHURINE 7.0 04/02/2017 0239   GLUCOSEU NEGATIVE 04/02/2017 0239   HGBUR NEGATIVE 04/02/2017 0239   BILIRUBINUR NEGATIVE 04/02/2017 0239   BILIRUBINUR negative 05/29/2012 1117   KETONESUR NEGATIVE 04/02/2017 0239   PROTEINUR NEGATIVE 04/02/2017 0239   UROBILINOGEN 1.0 08/14/2014 2214   NITRITE NEGATIVE 04/02/2017 0239   LEUKOCYTESUR NEGATIVE 04/02/2017 0239   Sepsis Labs: @LABRCNTIP (procalcitonin:4,lacticidven:4)    Recent Results (from the past 240 hour(s))  MRSA PCR Screening     Status: None   Collection Time: 04/02/17  5:39 PM  Result Value Ref Range Status   MRSA by PCR NEGATIVE NEGATIVE Final    Comment:        The GeneXpert MRSA Assay (FDA approved for NASAL specimens only), is one component of a comprehensive MRSA colonization surveillance program. It is not intended to diagnose MRSA infection nor to guide or monitor treatment for MRSA infections.       Radiology Studies: Ct Angio Head W Or Wo Contrast  Result Date: 04/02/2017 CLINICAL DATA:  Acute encephalopathy, waxing and waning. Evaluate for large vessel occlusion. Unknown LKN.Comorbidities include diabetes mellitus type II, hypertension, and dementia. EXAM: CT ANGIOGRAPHY HEAD AND NECK TECHNIQUE: Multidetector CT imaging of the head and neck was performed using the standard protocol during bolus administration of intravenous contrast. Multiplanar CT image reconstructions and MIPs were obtained to evaluate the vascular anatomy. Carotid stenosis measurements  (when applicable) are obtained utilizing NASCET criteria, using the distal internal carotid diameter as the denominator. CONTRAST:  51mL ISOVUE-370 IOPAMIDOL (ISOVUE-370) INJECTION 76% COMPARISON:  CT head earlier today.  CTA 02/06/2016. FINDINGS: CTA NECK Aortic arch: Standard branching. Imaged portion shows no evidence of aneurysm or dissection. No significant stenosis of the major arch vessel origins. Moderate calcification. Right carotid system: Calcific plaque, similar to priors. No evidence of dissection, stenosis (50% or greater) or occlusion. Left carotid system: Calcific plaque, similar priors. No evidence of dissection, stenosis (50% or greater) or occlusion. Vertebral arteries: LEFT vertebral slightly larger, both patent. No significant ostial narrowing. No evidence of dissection, stenosis (50% or greater) or occlusion. Nonvascular soft tissues:  Noncontributory. CTA HEAD Anterior circulation: Calcification of the cavernous internal carotid arteries consistent with cerebrovascular atherosclerotic disease. No signs of intracranial large vessel occlusion. No significant stenosis, proximal occlusion, aneurysm, or vascular malformation. Posterior  circulation: Both vertebrals contribute to formation of the basilar. Posterior cerebral arteries and cerebellar arteries appear unremarkable. No significant stenosis, proximal occlusion, aneurysm, or vascular malformation. Venous sinuses: As permitted by contrast timing, patent. Anatomic variants: None of significance. Delayed phase:   No abnormal intracranial enhancement. Review of the MIP images confirms the above findings IMPRESSION: Minor extracranial and intracranial atheromatous change, not unexpected for age, and stable from 2017. No large vessel occlusion, dissection, or other significant vascular finding. Atrophy and small vessel disease without acute intracranial process. Electronically Signed   By: Staci Righter M.D.   On: 04/02/2017 08:00   Ct Head Wo  Contrast  Result Date: 04/02/2017 CLINICAL DATA:  Acute onset of altered level of consciousness. EXAM: CT HEAD WITHOUT CONTRAST TECHNIQUE: Contiguous axial images were obtained from the base of the skull through the vertex without intravenous contrast. COMPARISON:  CTA of the head, and MRI of the brain performed 02/06/2016 FINDINGS: Brain: No evidence of acute infarction, hemorrhage, hydrocephalus, extra-axial collection or mass lesion/mass effect. Prominence of the ventricles and sulci reflects mild to moderate cortical volume loss. Mild cerebellar atrophy is noted. Scattered periventricular and subcortical matter change likely reflects small vessel ischemic microangiopathy. The brainstem and fourth ventricle are within normal limits. The basal ganglia are unremarkable in appearance. The cerebral hemispheres demonstrate grossly normal gray-white differentiation. No mass effect or midline shift is seen. Vascular: No hyperdense vessel or unexpected calcification. Skull: There is no evidence of fracture; visualized osseous structures are unremarkable in appearance. Sinuses/Orbits: The orbits are within normal limits. There is partial opacification of the mastoid air cells bilaterally. The paranasal sinuses are well-aerated. Other: No significant soft tissue abnormalities are seen. IMPRESSION: 1. No acute intracranial pathology seen on CT. 2. Mild to moderate cortical volume loss and scattered small vessel ischemic microangiopathy. 3. Partial opacification of the mastoid air cells bilaterally. Electronically Signed   By: Garald Balding M.D.   On: 04/02/2017 03:50   Ct Angio Neck W And/or Wo Contrast  Result Date: 04/02/2017 CLINICAL DATA:  Acute encephalopathy, waxing and waning. Evaluate for large vessel occlusion. Unknown LKN.Comorbidities include diabetes mellitus type II, hypertension, and dementia. EXAM: CT ANGIOGRAPHY HEAD AND NECK TECHNIQUE: Multidetector CT imaging of the head and neck was performed  using the standard protocol during bolus administration of intravenous contrast. Multiplanar CT image reconstructions and MIPs were obtained to evaluate the vascular anatomy. Carotid stenosis measurements (when applicable) are obtained utilizing NASCET criteria, using the distal internal carotid diameter as the denominator. CONTRAST:  12mL ISOVUE-370 IOPAMIDOL (ISOVUE-370) INJECTION 76% COMPARISON:  CT head earlier today.  CTA 02/06/2016. FINDINGS: CTA NECK Aortic arch: Standard branching. Imaged portion shows no evidence of aneurysm or dissection. No significant stenosis of the major arch vessel origins. Moderate calcification. Right carotid system: Calcific plaque, similar to priors. No evidence of dissection, stenosis (50% or greater) or occlusion. Left carotid system: Calcific plaque, similar priors. No evidence of dissection, stenosis (50% or greater) or occlusion. Vertebral arteries: LEFT vertebral slightly larger, both patent. No significant ostial narrowing. No evidence of dissection, stenosis (50% or greater) or occlusion. Nonvascular soft tissues:  Noncontributory. CTA HEAD Anterior circulation: Calcification of the cavernous internal carotid arteries consistent with cerebrovascular atherosclerotic disease. No signs of intracranial large vessel occlusion. No significant stenosis, proximal occlusion, aneurysm, or vascular malformation. Posterior circulation: Both vertebrals contribute to formation of the basilar. Posterior cerebral arteries and cerebellar arteries appear unremarkable. No significant stenosis, proximal occlusion, aneurysm, or vascular malformation. Venous sinuses: As permitted by  contrast timing, patent. Anatomic variants: None of significance. Delayed phase:   No abnormal intracranial enhancement. Review of the MIP images confirms the above findings IMPRESSION: Minor extracranial and intracranial atheromatous change, not unexpected for age, and stable from 2017. No large vessel occlusion,  dissection, or other significant vascular finding. Atrophy and small vessel disease without acute intracranial process. Electronically Signed   By: Staci Righter M.D.   On: 04/02/2017 08:00   Mr Brain Wo Contrast  Result Date: 04/02/2017 CLINICAL DATA:  Acute encephalopathy, waxing and waning. Comorbidities include diabetes, dementia, and hypertension as well as schizophrenia. EXAM: MRI HEAD WITHOUT CONTRAST TECHNIQUE: Multiplanar, multiecho pulse sequences of the brain and surrounding structures were obtained without intravenous contrast. COMPARISON:  CT head without contrast as well as CTA head neck earlier today. FINDINGS: Brain: No evidence for acute infarction, hemorrhage, mass lesion, hydrocephalus, or extra-axial fluid. Generalized atrophy. Moderate subcortical and periventricular T2 and FLAIR hyperintensities, likely chronic microvascular ischemic change. Vascular: Normal flow voids. Skull and upper cervical spine: Normal marrow signal. Sinuses/Orbits: Negative. Other: None. IMPRESSION: Atrophy and small vessel disease.  No acute intracranial findings. Electronically Signed   By: Staci Righter M.D.   On: 04/02/2017 09:27   Dg Chest Port 1 View  Result Date: 04/02/2017 CLINICAL DATA:  Altered mental status EXAM: PORTABLE CHEST 1 VIEW COMPARISON:  Chest radiograph 02/20/2017 FINDINGS: Retrocardiac left lung base opacities, likely atelectasis. No pleural effusion or pneumothorax. No pulmonary edema. IMPRESSION: No active disease. Electronically Signed   By: Ulyses Jarred M.D.   On: 04/02/2017 02:56     Scheduled Meds: . aspirin  300 mg Rectal Daily   Or  . aspirin  325 mg Oral Daily  . cetaphil  1 application Topical BID  . enoxaparin (LOVENOX) injection  40 mg Subcutaneous Q24H  . insulin aspart  0-9 Units Subcutaneous Q4H  . insulin detemir  6 Units Subcutaneous QHS  . ipratropium  2 spray Each Nare TID AC & HS  . polyvinyl alcohol  1 drop Both Eyes TID   Continuous Infusions: .  sodium chloride 100 mL/hr at 04/03/17 0437     LOS: 0 days    Time spent: 25 minutes  Greater than 50% of the time spent on counseling and coordinating the care.   Leisa Lenz, MD [facility] Pager [phone]  If 7PM-7AM, please contact night-coverage [email or web address] Password [facility] 04/03/2017, 10:46 AM

## 2017-04-03 NOTE — Progress Notes (Addendum)
Pt's Blood pressure 205/128 at 0432. PRN 10 mg IV hydralazine given to pt and on call Baltazar Najjar notified. Will recheck blood pressure and continue to monitor pt.

## 2017-04-04 ENCOUNTER — Telehealth: Payer: Self-pay

## 2017-04-04 LAB — CBC
HCT: 34 % — ABNORMAL LOW (ref 36.0–46.0)
Hemoglobin: 11.1 g/dL — ABNORMAL LOW (ref 12.0–15.0)
MCH: 27.9 pg (ref 26.0–34.0)
MCHC: 32.6 g/dL (ref 30.0–36.0)
MCV: 85.4 fL (ref 78.0–100.0)
PLATELETS: 188 10*3/uL (ref 150–400)
RBC: 3.98 MIL/uL (ref 3.87–5.11)
RDW: 13.9 % (ref 11.5–15.5)
WBC: 8.7 10*3/uL (ref 4.0–10.5)

## 2017-04-04 LAB — BASIC METABOLIC PANEL
Anion gap: 4 — ABNORMAL LOW (ref 5–15)
BUN: 16 mg/dL (ref 6–20)
CO2: 28 mmol/L (ref 22–32)
CREATININE: 0.87 mg/dL (ref 0.44–1.00)
Calcium: 8.1 mg/dL — ABNORMAL LOW (ref 8.9–10.3)
Chloride: 105 mmol/L (ref 101–111)
GFR, EST NON AFRICAN AMERICAN: 58 mL/min — AB (ref >60)
Glucose, Bld: 97 mg/dL (ref 65–99)
POTASSIUM: 3.7 mmol/L (ref 3.5–5.1)
SODIUM: 137 mmol/L (ref 135–145)

## 2017-04-04 LAB — GLUCOSE, CAPILLARY
GLUCOSE-CAPILLARY: 88 mg/dL (ref 65–99)
Glucose-Capillary: 113 mg/dL — ABNORMAL HIGH (ref 65–99)
Glucose-Capillary: 151 mg/dL — ABNORMAL HIGH (ref 65–99)
Glucose-Capillary: 206 mg/dL — ABNORMAL HIGH (ref 65–99)

## 2017-04-04 MED ORDER — RESOURCE THICKENUP CLEAR PO POWD: 1.0000 | ORAL | 0 refills | Status: DC | PRN

## 2017-04-04 NOTE — Discharge Summary (Signed)
Physician Discharge Summary  Debra Tran DXI:[id number] DOB: 07/06/1927 DOA: 04/02/2017  PCP: Gildardo Cranker, DO  Admit date: 04/02/2017 Discharge date: 04/04/2017  Recommendations for Outpatient Follow-up:  1. Check CBC and BMP per SNF protocol   Discharge Diagnoses:  Principal Problem:   Acute encephalopathy Active Problems:   Controlled diabetes mellitus type II without complication (HCC)   Dementia without behavioral disturbance   Essential hypertension, benign   Dysphagia, oropharyngeal phase    Discharge Condition: stable   Diet recommendation: as tolerated   History of present illness:  84 y.o.femalewith medical history significant ofHTN, DM2, dementia, paranoid schizophrenia. Patient was sent in to ED from SNF this evening due to AMS. She is normally quite verbal, they noted she was poorly responsive. Apparently became more responsive when EMS arrived, but became less responsive again in ED. CT angio head and CT head did not show acute findings other than minor extracranial and intracranial atheromatous change, not unexpected for age, and stable from 2017. No large vessel occlusion, dissection, or other significant vascular finding. MRI brain showed atrophy and small vessel disease.  No acute intracranial findings.   Hospital Course:  Principal Problem:   Acute encephalopathy / Dementia without behavioral disturbance - Unclear etiology - CT head and MRI brain without acute findings - CXR without acute findings  - May return to SNF - Stable mental status, more alert this am  Active Problems:   Controlled diabetes mellitus type II without complication (HCC) - Resume insulin per home regimen     Essential hypertension, benign - Resume home meds    DVT prophylaxis: Lovenox subQ Code Status: full code  Family Communication: daughter at bedside     Consultants:   PT  Procedures:   None   Antimicrobials:   None    Signed:  Leisa Lenz, MD  [facility] 04/04/2017, 9:04 AM  Pager #: [phone]  Time spent in minutes: more than 30 minutes    Discharge Exam: Vitals:   04/03/17 1342 04/04/17 0438  BP: (!) 140/57 (!) 163/53  Pulse: 63 (!) 52  Resp: (!) 22 20  Temp: 98.3 F (36.8 C) 98.8 F (37.1 C)  SpO2: 96% 100%   Vitals:   04/03/17 0457 04/03/17 0635 04/03/17 1342 04/04/17 0438  BP: (!) 154/110 (!) 164/51 (!) 140/57 (!) 163/53  Pulse:   63 (!) 52  Resp:   (!) 22 20  Temp:   98.3 F (36.8 C) 98.8 F (37.1 C)  TempSrc:   Oral Axillary  SpO2:   96% 100%  Weight:      Height:        General: Pt is alert, not in acute distress Cardiovascular: Regular rate and rhythm, S1/S2 + Respiratory: Clear to auscultation bilaterally, no wheezing, no crackles, no rhonchi Abdominal: Soft, non tender, non distended, bowel sounds +, no guarding Extremities: trace edema in LE, no tenderness  Neuro: Grossly nonfocal  Discharge Instructions  Discharge Instructions    Call MD for:  persistant nausea and vomiting   Complete by:  As directed    Call MD for:  redness, tenderness, or signs of infection (pain, swelling, redness, odor or green/yellow discharge around incision site)   Complete by:  As directed    Call MD for:  severe uncontrolled pain   Complete by:  As directed    Diet - low sodium heart healthy   Complete by:  As directed    Increase activity slowly   Complete by:  As  directed      Allergies as of 04/04/2017      Reactions   Ace Inhibitors    Unknown reaction per [facility]    Ether Nausea And Vomiting      Medication List    TAKE these medications   acetaminophen 325 MG tablet Commonly known as:  TYLENOL Take 650 mg by mouth every 6 (six) hours as needed for mild pain, moderate pain or fever.   aspirin EC 325 MG tablet Take 1 tablet (325 mg total) by mouth daily.   atorvastatin 20 MG tablet Commonly known as:  LIPITOR Take 1 tablet (20 mg total) by mouth at bedtime.    benztropine 1 MG tablet Commonly known as:  COGENTIN Take 1 mg by mouth at bedtime.   CALCIUM 600-D 600-400 MG-UNIT tablet Generic drug:  Calcium Carbonate-Vitamin D Take 1 tablet by mouth at bedtime.   cholecalciferol 1000 units tablet Commonly known as:  VITAMIN D Take 1,000 Units by mouth at bedtime.   cyanocobalamin 1000 MCG tablet Take 1,000 mcg by mouth daily with breakfast.   DECUBI-VITE Caps Take 1 capsule by mouth daily with breakfast.   docusate sodium 100 MG capsule Commonly known as:  COLACE Take 1 capsule (100 mg total) by mouth daily. For constipation What changed:    when to take this  additional instructions   donepezil 10 MG tablet Commonly known as:  ARICEPT Take 10 mg by mouth at bedtime.   escitalopram 20 MG tablet Commonly known as:  LEXAPRO Take 20 mg by mouth daily with breakfast.   feeding supplement (PRO-STAT SUGAR FREE 64) Liqd Take 30 mLs by mouth 2 (two) times daily.   Hypromellose 0.4 % Soln Place 1 drop into both eyes 3 (three) times daily.   insulin aspart 100 UNIT/ML injection Commonly known as:  novoLOG Inject 8 Units into the skin 3 (three) times daily after meals. Notify MD if BS is less than 70 or greater than 450. Hold Insulin if BS is less than 70   ipratropium 0.03 % nasal spray Commonly known as:  ATROVENT Place 2 sprays into both nostrils at bedtime. And at bedtime   ipratropium 0.03 % nasal spray Commonly known as:  ATROVENT Place 2 sprays into both nostrils 3 (three) times daily before meals.   ipratropium-albuterol 0.5-2.5 (3) MG/3ML Soln Commonly known as:  DUONEB Take 3 mLs by nebulization every 6 (six) hours as needed (for shortness of breath).   LEVEMIR FLEXPEN 100 UNIT/ML Pen Generic drug:  Insulin Detemir Inject 10 Units into the skin at bedtime.   loratadine 10 MG tablet Commonly known as:  CLARITIN Take 10 mg by mouth daily with breakfast.   losartan 50 MG tablet Commonly known as:  COZAAR Take  50 mg by mouth daily with breakfast.   Lotion Base Lotn Apply 1 application topically 2 (two) times daily.   NAMENDA XR 28 MG Cp24 24 hr capsule Generic drug:  memantine Take 28 mg by mouth daily with breakfast. For dementia   OLANZapine 5 MG tablet Commonly known as:  ZYPREXA Take 5 mg by mouth at bedtime.   omeprazole 20 MG capsule Commonly known as:  PRILOSEC Take 20 mg by mouth daily at 6 (six) AM.   RESOURCE THICKENUP CLEAR Powd Take 120 g by mouth as needed.      Follow-up Information    Gildardo Cranker, DO. Schedule an appointment as soon as possible for a visit in 2 week(s).   Specialty:  Internal  Medicine Contact information: [place] [address] [phone]            The results of significant diagnostics from this hospitalization (including imaging, microbiology, ancillary and laboratory) are listed below for reference.    Significant Diagnostic Studies: Ct Angio Head W Or Wo Contrast  Result Date: 04/02/2017 CLINICAL DATA:  Acute encephalopathy, waxing and waning. Evaluate for large vessel occlusion. Unknown LKN.Comorbidities include diabetes mellitus type II, hypertension, and dementia. EXAM: CT ANGIOGRAPHY HEAD AND NECK TECHNIQUE: Multidetector CT imaging of the head and neck was performed using the standard protocol during bolus administration of intravenous contrast. Multiplanar CT image reconstructions and MIPs were obtained to evaluate the vascular anatomy. Carotid stenosis measurements (when applicable) are obtained utilizing NASCET criteria, using the distal internal carotid diameter as the denominator. CONTRAST:  37mL ISOVUE-370 IOPAMIDOL (ISOVUE-370) INJECTION 76% COMPARISON:  CT head earlier today.  CTA 02/06/2016. FINDINGS: CTA NECK Aortic arch: Standard branching. Imaged portion shows no evidence of aneurysm or dissection. No significant stenosis of the major arch vessel origins. Moderate calcification. Right carotid system:  Calcific plaque, similar to priors. No evidence of dissection, stenosis (50% or greater) or occlusion. Left carotid system: Calcific plaque, similar priors. No evidence of dissection, stenosis (50% or greater) or occlusion. Vertebral arteries: LEFT vertebral slightly larger, both patent. No significant ostial narrowing. No evidence of dissection, stenosis (50% or greater) or occlusion. Nonvascular soft tissues:  Noncontributory. CTA HEAD Anterior circulation: Calcification of the cavernous internal carotid arteries consistent with cerebrovascular atherosclerotic disease. No signs of intracranial large vessel occlusion. No significant stenosis, proximal occlusion, aneurysm, or vascular malformation. Posterior circulation: Both vertebrals contribute to formation of the basilar. Posterior cerebral arteries and cerebellar arteries appear unremarkable. No significant stenosis, proximal occlusion, aneurysm, or vascular malformation. Venous sinuses: As permitted by contrast timing, patent. Anatomic variants: None of significance. Delayed phase:   No abnormal intracranial enhancement. Review of the MIP images confirms the above findings IMPRESSION: Minor extracranial and intracranial atheromatous change, not unexpected for age, and stable from 2017. No large vessel occlusion, dissection, or other significant vascular finding. Atrophy and small vessel disease without acute intracranial process. Electronically Signed   By: Staci Righter M.D.   On: 04/02/2017 08:00   Ct Head Wo Contrast  Result Date: 04/02/2017 CLINICAL DATA:  Acute onset of altered level of consciousness. EXAM: CT HEAD WITHOUT CONTRAST TECHNIQUE: Contiguous axial images were obtained from the base of the skull through the vertex without intravenous contrast. COMPARISON:  CTA of the head, and MRI of the brain performed 02/06/2016 FINDINGS: Brain: No evidence of acute infarction, hemorrhage, hydrocephalus, extra-axial collection or mass lesion/mass effect.  Prominence of the ventricles and sulci reflects mild to moderate cortical volume loss. Mild cerebellar atrophy is noted. Scattered periventricular and subcortical matter change likely reflects small vessel ischemic microangiopathy. The brainstem and fourth ventricle are within normal limits. The basal ganglia are unremarkable in appearance. The cerebral hemispheres demonstrate grossly normal gray-white differentiation. No mass effect or midline shift is seen. Vascular: No hyperdense vessel or unexpected calcification. Skull: There is no evidence of fracture; visualized osseous structures are unremarkable in appearance. Sinuses/Orbits: The orbits are within normal limits. There is partial opacification of the mastoid air cells bilaterally. The paranasal sinuses are well-aerated. Other: No significant soft tissue abnormalities are seen. IMPRESSION: 1. No acute intracranial pathology seen on CT. 2. Mild to moderate cortical volume loss and scattered small vessel ischemic microangiopathy. 3. Partial opacification of the mastoid air cells bilaterally.  Electronically Signed   By: Garald Balding M.D.   On: 04/02/2017 03:50   Ct Angio Neck W And/or Wo Contrast  Result Date: 04/02/2017 CLINICAL DATA:  Acute encephalopathy, waxing and waning. Evaluate for large vessel occlusion. Unknown LKN.Comorbidities include diabetes mellitus type II, hypertension, and dementia. EXAM: CT ANGIOGRAPHY HEAD AND NECK TECHNIQUE: Multidetector CT imaging of the head and neck was performed using the standard protocol during bolus administration of intravenous contrast. Multiplanar CT image reconstructions and MIPs were obtained to evaluate the vascular anatomy. Carotid stenosis measurements (when applicable) are obtained utilizing NASCET criteria, using the distal internal carotid diameter as the denominator. CONTRAST:  51mL ISOVUE-370 IOPAMIDOL (ISOVUE-370) INJECTION 76% COMPARISON:  CT head earlier today.  CTA 02/06/2016. FINDINGS: CTA  NECK Aortic arch: Standard branching. Imaged portion shows no evidence of aneurysm or dissection. No significant stenosis of the major arch vessel origins. Moderate calcification. Right carotid system: Calcific plaque, similar to priors. No evidence of dissection, stenosis (50% or greater) or occlusion. Left carotid system: Calcific plaque, similar priors. No evidence of dissection, stenosis (50% or greater) or occlusion. Vertebral arteries: LEFT vertebral slightly larger, both patent. No significant ostial narrowing. No evidence of dissection, stenosis (50% or greater) or occlusion. Nonvascular soft tissues:  Noncontributory. CTA HEAD Anterior circulation: Calcification of the cavernous internal carotid arteries consistent with cerebrovascular atherosclerotic disease. No signs of intracranial large vessel occlusion. No significant stenosis, proximal occlusion, aneurysm, or vascular malformation. Posterior circulation: Both vertebrals contribute to formation of the basilar. Posterior cerebral arteries and cerebellar arteries appear unremarkable. No significant stenosis, proximal occlusion, aneurysm, or vascular malformation. Venous sinuses: As permitted by contrast timing, patent. Anatomic variants: None of significance. Delayed phase:   No abnormal intracranial enhancement. Review of the MIP images confirms the above findings IMPRESSION: Minor extracranial and intracranial atheromatous change, not unexpected for age, and stable from 2017. No large vessel occlusion, dissection, or other significant vascular finding. Atrophy and small vessel disease without acute intracranial process. Electronically Signed   By: Staci Righter M.D.   On: 04/02/2017 08:00   Mr Brain Wo Contrast  Result Date: 04/02/2017 CLINICAL DATA:  Acute encephalopathy, waxing and waning. Comorbidities include diabetes, dementia, and hypertension as well as schizophrenia. EXAM: MRI HEAD WITHOUT CONTRAST TECHNIQUE: Multiplanar, multiecho pulse  sequences of the brain and surrounding structures were obtained without intravenous contrast. COMPARISON:  CT head without contrast as well as CTA head neck earlier today. FINDINGS: Brain: No evidence for acute infarction, hemorrhage, mass lesion, hydrocephalus, or extra-axial fluid. Generalized atrophy. Moderate subcortical and periventricular T2 and FLAIR hyperintensities, likely chronic microvascular ischemic change. Vascular: Normal flow voids. Skull and upper cervical spine: Normal marrow signal. Sinuses/Orbits: Negative. Other: None. IMPRESSION: Atrophy and small vessel disease.  No acute intracranial findings. Electronically Signed   By: Staci Righter M.D.   On: 04/02/2017 09:27   Dg Chest Port 1 View  Result Date: 04/02/2017 CLINICAL DATA:  Altered mental status EXAM: PORTABLE CHEST 1 VIEW COMPARISON:  Chest radiograph 02/20/2017 FINDINGS: Retrocardiac left lung base opacities, likely atelectasis. No pleural effusion or pneumothorax. No pulmonary edema. IMPRESSION: No active disease. Electronically Signed   By: Ulyses Jarred M.D.   On: 04/02/2017 02:56    Microbiology: Recent Results (from the past 240 hour(s))  MRSA PCR Screening     Status: None   Collection Time: 04/02/17  5:39 PM  Result Value Ref Range Status   MRSA by PCR NEGATIVE NEGATIVE Final    Comment:  The GeneXpert MRSA Assay (FDA approved for NASAL specimens only), is one component of a comprehensive MRSA colonization surveillance program. It is not intended to diagnose MRSA infection nor to guide or monitor treatment for MRSA infections.      Labs: Basic Metabolic Panel: Recent Labs  Lab 03/29/17 04/02/17 0140 04/03/17 0418 04/04/17 0401  NA 137 138 138 137  K 4.9 4.2 4.3 3.7  CL  --  99* 104 105  CO2  --  33* 25 28  GLUCOSE  --  175* 108* 97  BUN 20 20 17 16   CREATININE 0.9 1.10* 0.87 0.87  CALCIUM  --  9.0 8.4* 8.1*   Liver Function Tests: Recent Labs  Lab 03/29/17 04/02/17 0244  AST 16 25   ALT 16 18  ALKPHOS 117 87  BILITOT  --  0.7  PROT  --  6.5  ALBUMIN  --  3.1*   No results for input(s): LIPASE, AMYLASE in the last 168 hours. Recent Labs  Lab 04/02/17 0244  AMMONIA 14   CBC: Recent Labs  Lab 03/29/17 04/02/17 0140 04/04/17 0401  WBC 6.8 8.5 8.7  NEUTROABS 4 4.9  --   HGB 12.7 14.2 11.1*  HCT 39 44.1 34.0*  MCV  --  86.0 85.4  PLT 211 213 188   Cardiac Enzymes: No results for input(s): CKTOTAL, CKMB, CKMBINDEX, TROPONINI in the last 168 hours. BNP: BNP (last 3 results) No results for input(s): BNP in the last 8760 hours.  ProBNP (last 3 results) No results for input(s): PROBNP in the last 8760 hours.  CBG: Recent Labs  Lab 04/03/17 1920 04/03/17 2220 04/04/17 0037 04/04/17 0413 04/04/17 0805  GLUCAP 128* 174* 151* 88 113*

## 2017-04-04 NOTE — Progress Notes (Signed)
Pt discharged to home, report called to [place], [place] dispatched. SRP, RN

## 2017-04-04 NOTE — Evaluation (Signed)
Physical Therapy Evaluation Patient Details Name: Debra Tran MRN: [id number] DOB: 1927/09/28 Today's Date: 04/04/2017   History of Present Illness  83 y.o. female with medical history significant of HTN, DM2, dementia, paranoid schizophrenia and admitted from SNF for acute encephalopathy.  Clinical Impression  Pt admitted with above diagnosis. Pt currently with functional limitations due to the deficits listed below (see PT Problem List).  Pt will benefit from skilled PT to increase their independence and safety with mobility to allow discharge to the venue listed below.   Pt and daughter agreeable for pt to mobilize today.  Daughter reports pt typically required to use w/c at facility however is able to ambulate.  Recommend f/u PT upon return to facility.    Follow Up Recommendations SNF    Equipment Recommendations  None recommended by PT    Recommendations for Other Services       Precautions / Restrictions Precautions Precautions: Fall      Mobility  Bed Mobility Overal bed mobility: Needs Assistance Bed Mobility: Supine to Sit     Supine to sit: Min guard     General bed mobility comments: provided a hand for pt to pull trunk upright, verbal cues for technique  Transfers Overall transfer level: Needs assistance Equipment used: Rolling walker (2 wheeled) Transfers: Sit to/from Stand Sit to Stand: Min guard         General transfer comment: min/guard for safety  Ambulation/Gait Ambulation/Gait assistance: Min guard Ambulation Distance (Feet): 30 Feet Assistive device: Rolling walker (2 wheeled) Gait Pattern/deviations: Step-through pattern;Decreased stride length     General Gait Details: verbal cues for RW positioning, limited distance as pt typically required to use w/c at facility  Stairs            Wheelchair Mobility    Modified Rankin (Stroke Patients Only)       Balance Overall balance assessment: Needs assistance          Standing balance support: Bilateral upper extremity supported Standing balance-Leahy Scale: Poor Standing balance comment: requires UE support                             Pertinent Vitals/Pain Pain Assessment: No/denies pain    Home Living Family/patient expects to be discharged to:: Skilled nursing facility                      Prior Function Level of Independence: Independent with assistive device(s)         Comments: pt typically transfers to/from w/c at facility however is able to ambulate with RW      Hand Dominance        Extremity/Trunk Assessment        Lower Extremity Assessment Lower Extremity Assessment: Generalized weakness       Communication   Communication: No difficulties  Cognition Arousal/Alertness: Awake/alert Behavior During Therapy: WFL for tasks assessed/performed Overall Cognitive Status: History of cognitive impairments - at baseline                                        General Comments      Exercises     Assessment/Plan    PT Assessment Patient needs continued PT services  PT Problem List Decreased strength;Decreased activity tolerance;Decreased balance;Decreased knowledge of use of DME;Decreased mobility  PT Treatment Interventions Therapeutic activities;DME instruction;Gait training;Therapeutic exercise;Patient/family education;Balance training;Wheelchair mobility training;Functional mobility training    PT Goals (Current goals can be found in the Care Plan section)  Acute Rehab PT Goals PT Goal Formulation: With patient/family Time For Goal Achievement: 04/11/17 Potential to Achieve Goals: Good    Frequency Min 2X/week   Barriers to discharge        Co-evaluation               AM-PAC PT "6 Clicks" Daily Activity  Outcome Measure Difficulty turning over in bed (including adjusting bedclothes, sheets and blankets)?: A Little Difficulty moving from lying on back to  sitting on the side of the bed? : A Lot Difficulty sitting down on and standing up from a chair with arms (e.g., wheelchair, bedside commode, etc,.)?: Unable Help needed moving to and from a bed to chair (including a wheelchair)?: A Little Help needed walking in hospital room?: A Little Help needed climbing 3-5 steps with a railing? : A Lot 6 Click Score: 14    End of Session Equipment Utilized During Treatment: Gait belt Activity Tolerance: Patient tolerated treatment well Patient left: in bed;with call bell/phone within reach;with bed alarm set;with family/visitor present   PT Visit Diagnosis: Difficulty in walking, not elsewhere classified (R26.2)    Time: 9892-1194 PT Time Calculation (min) (ACUTE ONLY): 20 min   Charges:   PT Evaluation $PT Eval Low Complexity: 1 Low     PT G CodesCarmelia Bake, PT, DPT 04/04/2017 Pager: [phone]  York Ram E 04/04/2017, 12:14 PM

## 2017-04-04 NOTE — Clinical Social Work Placement (Signed)
Patient returning to [facility] SNF. Facility aware of patient's discharge and confirmed patient's ability to return. PTAR contacted, patient's family notified. Patient's RN can call report to [phone] room 225B, packet complete. CSW signing off, no other needs identified at this time.   CLINICAL SOCIAL WORK PLACEMENT  NOTE  Date:  04/04/2017  Patient Details  Name: Debra Tran MRN: [id number] Date of Birth: 1927-07-28  Clinical Social Work is seeking post-discharge placement for this patient at the [place] level of care (*CSW will initial, date and re-position this form in  chart as items are completed):  Yes   Patient/family provided with [place] Work Department's list of facilities offering this level of care within the geographic area requested by the patient (or if unable, by the patient's family).  Yes   Patient/family informed of their freedom to choose among providers that offer the needed level of care, that participate in Medicare, Medicaid or managed care program needed by the patient, have an available bed and are willing to accept the patient.  Yes   Patient/family informed of [facility]'s ownership interest in [facility] and [facility], as well as of the fact that they are under no obligation to receive care at these facilities.  PASRR submitted to EDS on       PASRR number received on       Existing PASRR number confirmed on 04/04/17     FL2 transmitted to all facilities in geographic area requested by pt/family on 04/04/17     FL2 transmitted to all facilities within larger geographic area on       Patient informed that his/her managed care company has contracts with or will negotiate with certain facilities, including the following:        Yes   Patient/family informed of bed offers received.  Patient chooses bed at [facility]     Physician recommends and patient chooses bed at        Patient to be transferred to [facility] on 04/04/17.  Patient to be transferred to facility by PTAR     Patient family notified on 04/04/17 of transfer.  Name of family member notified:  Lytle Michaels     PHYSICIAN       Additional Comment:    _______________________________________________ Burnis Medin, LCSW 04/04/2017, 12:48 PM

## 2017-04-04 NOTE — NC FL2 (Signed)
[place] LEVEL OF CARE SCREENING TOOL     IDENTIFICATION  Patient Name: Debra Tran Birthdate: 03/28/1928 Sex: female Admission Date (Current Location): 04/02/2017  [facility] and [place] Number:  Herbalist and Address:  [facility],  [place], [place]      Provider Number: [id number]  Attending Physician Name and Address:  Robbie Lis, MD  Relative Name and Phone Number:       Current Level of Care: Hospital Recommended Level of Care: [place] Prior Approval Number:    Date Approved/Denied:   PASRR Number: [id number]  Discharge Plan: SNF    Current Diagnoses: Patient Active Problem List   Diagnosis Date Noted  . Acute encephalopathy 04/02/2017  . Chronic allergic bronchitis 03/19/2017  . Chronic cough 02/28/2017  . Healthcare associated bacterial pneumonia 02/01/2017  . Post-nasal drip 01/05/2017  . Dysphagia, oropharyngeal phase 12/06/2016  . Chronic nonseasonal allergic rhinitis due to pollen 11/02/2016  . Seborrhea corporis 11/02/2016  . TIA (transient ischemic attack) 02/27/2016  . Nonmelanoma skin cancer 07/18/2015  . Essential hypertension, benign 02/24/2015  . Stress incontinence 11/29/2014  . Tardive dyskinesia 09/15/2014  . Chronic constipation 07/03/2014  . GERD (gastroesophageal reflux disease) 05/11/2014  . Depressive disorder   . Dementia without behavioral disturbance 12/12/2011  . ARTHRITIS 09/20/2009  . Controlled diabetes mellitus type II without complication ([place]) 47/82/9562  . Hyperlipidemia associated with type 2 diabetes mellitus ([place]) 06/06/2006  . Schizophrenia ([place]) 06/06/2006    Orientation RESPIRATION BLADDER Height & Weight     Self  Normal Incontinent Weight: 173 lb 4.5 oz (78.6 kg) Height:  5\' 7"  (170.2 cm)  BEHAVIORAL SYMPTOMS/MOOD NEUROLOGICAL BOWEL NUTRITION STATUS        Diet(DYS 1)  AMBULATORY STATUS COMMUNICATION OF NEEDS Skin   Limited  Assist Verbally Normal                       Personal Care Assistance Level of Assistance  Bathing, Feeding, Dressing Bathing Assistance: Maximum assistance Feeding assistance: Independent Dressing Assistance: Maximum assistance     Functional Limitations Info  Sight, Hearing, Speech Sight Info: Impaired Hearing Info: Adequate Speech Info: Adequate    SPECIAL CARE FACTORS FREQUENCY  PT (By licensed PT), Speech therapy     PT Frequency: Min 2x/week       Speech Therapy Frequency: Min 1x/week      Contractures Contractures Info: Not present    Additional Factors Info  Code Status, Allergies Code Status Info: Full Code Allergies Info: Ace Inhibitors;Ether;           Current Medications (04/04/2017):  This is the current hospital active medication list Current Facility-Administered Medications  Medication Dose Route Frequency Provider Last Rate Last Dose  . 0.9 %  sodium chloride infusion   Intravenous Continuous Robbie Lis, MD 10 mL/hr at 04/03/17 1138    . acetaminophen (TYLENOL) tablet 650 mg  650 mg Oral Q6H PRN Etta Quill, DO       Or  . acetaminophen (TYLENOL) suppository 650 mg  650 mg Rectal Q6H PRN Etta Quill, DO      . aspirin suppository 300 mg  300 mg Rectal Daily Alcario Drought, Jared M, DO   300 mg at 04/02/17 1254   Or  . aspirin tablet 325 mg  325 mg Oral Daily Etta Quill, DO   325 mg at 04/04/17 0932  . bisacodyl (DULCOLAX) suppository 10  mg  10 mg Rectal Daily PRN Gardiner Barefoot, NP      . cetaphil lotion 1 application  1 application Topical BID Etta Quill, DO   1 application at 70/96/28 [phone]  . enoxaparin (LOVENOX) injection 40 mg  40 mg Subcutaneous Q24H Jennette Kettle M, DO   40 mg at 04/03/17 1137  . hydrALAZINE (APRESOLINE) injection 5-10 mg  5-10 mg Intravenous Q4H PRN Etta Quill, DO   10 mg at 04/03/17 0435  . insulin aspart (novoLOG) injection 0-9 Units  0-9 Units Subcutaneous Q4H Etta Quill, DO   2  Units at 04/04/17 0038  . insulin detemir (LEVEMIR) injection 6 Units  6 Units Subcutaneous QHS Etta Quill, DO   6 Units at 04/03/17 2218  . ipratropium (ATROVENT) 0.03 % nasal spray 2 spray  2 spray Each Nare TID AC & HS Etta Quill, DO   2 spray at 04/04/17 0933  . ipratropium-albuterol (DUONEB) 0.5-2.5 (3) MG/3ML nebulizer solution 3 mL  3 mL Nebulization Q6H PRN Etta Quill, DO      . ondansetron [facility]) tablet 4 mg  4 mg Oral Q6H PRN Etta Quill, DO       Or  . ondansetron [facility]) injection 4 mg  4 mg Intravenous Q6H PRN Etta Quill, DO      . polyvinyl alcohol (LIQUIFILM TEARS) 1.4 % ophthalmic solution 1 drop  1 drop Both Eyes TID Etta Quill, DO   1 drop at 04/04/17 0932  . [place]   Oral PRN Robbie Lis, MD      . sodium phosphate (FLEET) 7-19 GM/118ML enema 1 enema  1 enema Rectal Q3 days PRN Kirby-Graham, Karsten Fells, NP         Discharge Medications: Please see discharge summary for a list of discharge medications.  Relevant Imaging Results:  Relevant Lab Results:   Additional Information SSN    [id number]  Burnis Medin, LCSW

## 2017-04-04 NOTE — Progress Notes (Signed)
Report given to [place] at [facility]. Pt ready for transport. SRP,RN

## 2017-04-04 NOTE — Discharge Instructions (Signed)

## 2017-04-04 NOTE — Telephone Encounter (Signed)
Possible re-admission to facility. This is a patient you were seeing at [facility] . [facility] F/U is needed if patient was re-admitted to facility upon discharge. Hospital discharge from [facility] on  04/04/17.

## 2017-04-04 NOTE — Clinical Social Work Note (Signed)
Clinical Social Work Assessment  Patient Details  Name: Debra Tran MRN: [id number] Date of Birth: [birth year]  Date of referral:  04/04/17               Reason for consult:  Discharge Planning                Permission sought to share information with:  Facility Sport and exercise psychologist, Family Supports Permission granted to share information::     Name::     Charla Criscione  Agency::     Relationship::  Daughter  Contact Information:  [phone]  Housing/Transportation Living arrangements for the past 2 months:  [place] of Information:  Adult Children Patient Interpreter Needed:  None Criminal Activity/Legal Involvement Pertinent to Current Situation/Hospitalization:  No - Comment as needed Significant Relationships:  Adult Children Lives with:  Facility Resident Do you feel safe going back to the place where you live?  Yes Need for family participation in patient care:  Yes (Comment)  Care giving concerns:  Patient from [facility] SNF for [facility] term care.    Social Worker assessment / plan:  CSW spoke with patient's daughter regarding discharge plans. Patient's daughter reported that the plan is for patient to discharge back to [facility] SNF.   CSW contacted [facility], admissions staff confirmed patient's ability to return.  CSW will complete FL2 and continue to follow and assist with discharge planning.  Employment status:  Retired Forensic scientist:  Information systems manager, Medicaid In [place] PT Recommendations:  [place] / Referral to community resources:  (Patient from SNF for [facility] term [facility])  Patient/Family's Response to care:  Patient's daughter agreeable to dc back to SNF for [facility].  Patient/Family's Understanding of and Emotional Response to Diagnosis, Current Treatment, and Prognosis:  Patient's daughter involved in patient's care and verbalized plan for patient to dc back to current SNF. Patient's daughter and  CSW disussed patient's care at current SNF, patient's daughter reported concerns. Patient's daughter reported that she has addressed concerns with administration and that she will be having a meeting. CSW validated patient's daughters concerns and positively affirmed patient's daughter advocacy for patient.   Emotional Assessment Appearance:  Appears stated age Attitude/Demeanor/Rapport:    Affect (typically observed):  Quiet Orientation:  Oriented to Self Alcohol / Substance use:  Not Applicable Psych involvement (Current and /or in the community):  No (Comment)  Discharge Needs  Concerns to be addressed:  Care Coordination Readmission within the last 30 days:  No Current discharge risk:  None Barriers to Discharge:  No Barriers Identified   Burnis Medin, LCSW 04/04/2017, 11:36 AM

## 2017-04-05 ENCOUNTER — Encounter: Payer: Self-pay | Admitting: Adult Health

## 2017-04-05 ENCOUNTER — Non-Acute Institutional Stay (SKILLED_NURSING_FACILITY): Payer: Medicare Other | Admitting: Adult Health

## 2017-04-05 DIAGNOSIS — I10 Essential (primary) hypertension: Secondary | ICD-10-CM | POA: Diagnosis not present

## 2017-04-05 DIAGNOSIS — R489 Unspecified symbolic dysfunctions: Secondary | ICD-10-CM | POA: Diagnosis not present

## 2017-04-05 DIAGNOSIS — Z794 Long term (current) use of insulin: Secondary | ICD-10-CM | POA: Diagnosis not present

## 2017-04-05 DIAGNOSIS — M6281 Muscle weakness (generalized): Secondary | ICD-10-CM | POA: Diagnosis not present

## 2017-04-05 DIAGNOSIS — R1312 Dysphagia, oropharyngeal phase: Secondary | ICD-10-CM

## 2017-04-05 DIAGNOSIS — G459 Transient cerebral ischemic attack, unspecified: Secondary | ICD-10-CM

## 2017-04-05 DIAGNOSIS — E1169 Type 2 diabetes mellitus with other specified complication: Secondary | ICD-10-CM

## 2017-04-05 DIAGNOSIS — F0150 Vascular dementia without behavioral disturbance: Secondary | ICD-10-CM | POA: Diagnosis not present

## 2017-04-05 DIAGNOSIS — K219 Gastro-esophageal reflux disease without esophagitis: Secondary | ICD-10-CM | POA: Diagnosis not present

## 2017-04-05 DIAGNOSIS — F209 Schizophrenia, unspecified: Secondary | ICD-10-CM | POA: Diagnosis not present

## 2017-04-05 DIAGNOSIS — J45998 Other asthma: Secondary | ICD-10-CM | POA: Diagnosis not present

## 2017-04-05 DIAGNOSIS — E785 Hyperlipidemia, unspecified: Secondary | ICD-10-CM | POA: Diagnosis not present

## 2017-04-05 DIAGNOSIS — E119 Type 2 diabetes mellitus without complications: Secondary | ICD-10-CM

## 2017-04-05 DIAGNOSIS — G2401 Drug induced subacute dyskinesia: Secondary | ICD-10-CM | POA: Diagnosis not present

## 2017-04-05 DIAGNOSIS — F0391 Unspecified dementia with behavioral disturbance: Secondary | ICD-10-CM | POA: Diagnosis not present

## 2017-04-05 DIAGNOSIS — R1311 Dysphagia, oral phase: Secondary | ICD-10-CM | POA: Diagnosis not present

## 2017-04-05 DIAGNOSIS — R1319 Other dysphagia: Secondary | ICD-10-CM | POA: Diagnosis not present

## 2017-04-05 NOTE — Progress Notes (Signed)
Location:   [place] Room Number: 225 B Place of Service:  SNF (31)   CODE STATUS: Full Code  Allergies  Allergen Reactions  . Ace Inhibitors     Unknown reaction per MAR   . Ether Nausea And Vomiting    Chief Complaint  Patient presents with  . Hospitalization Follow-up    Hospital Follow up    HPI:  She is an 83 year old long term resident of this facility who has been hospitalized from 04-02-17 through 04-04-17. She was hospitalized for acute encephalopathy and dementia. Her workup was negative for any acute changes in her status. Today she is alert and sitting up in her wheelchair and is talking. She denies any chest pain; back pain; no changes in appetite; denies poor sleep and poor energy. There are no nursing concerns at this time. She will continue to be followed for her chronic illnesses including: hypertension; diabetes and dementia.   Past Medical History:  Diagnosis Date  . Allergic rhinitis   . CATARACT 06/06/2006   Qualifier: Diagnosis of  By: Genene Churn MD, Janett Billow    . Depressive disorder   . Diabetes mellitus type 2 in obese ([place])   . Essential hypertension, benign 02/24/2015  . Fungal dermatitis   . Hyperlipidemia   . Macular degeneration   . Mild cognitive impairment   . Narcissism ([place])   . Normocytic anemia   . Phobia   . Schizophrenia, paranoid ([place])   . Stress incontinence, female     Past Surgical History:  Procedure Laterality Date  . ABDOMINAL HYSTERECTOMY     due to fibroids  . Avastin Ingection Right 03/26/2017   In Right eye  . CATARACT EXTRACTION  6 16 2006  . CRYOTHERAPY  2 15 2006   facial AK  . hyperplastic   4 20 2006   AK shave biopsy  . intraocular injection  [id number]  . TOTAL ABDOMINAL HYSTERECTOMY W/ BILATERAL SALPINGOOPHORECTOMY     due to endometriosis  non malignant    Social History   Socioeconomic History  . Marital status: Divorced    Spouse name: Not on file  . Number of children: Not on file  .  Years of education: Not on file  . Highest education level: Not on file  Social Needs  . Financial resource strain: Not on file  . Food insecurity - worry: Not on file  . Food insecurity - inability: Not on file  . Transportation needs - medical: Not on file  . Transportation needs - non-medical: Not on file  Occupational History  . Not on file  Tobacco Use  . Smoking status: Never Smoker  . Smokeless tobacco: Never Used  Substance and Sexual Activity  . Alcohol use: No  . Drug use: No  . Sexual activity: Not on file  Other Topics Concern  . Not on file  Social History Narrative  . Not on file   Family History  Problem Relation Age of Onset  . Heart disease Mother   . Heart disease Father   . Stroke Father   . Cancer Cousin        liver and colon  . Stroke Sister       VITAL SIGNS BP (!) 152/80   Pulse 60   Temp (!) 97.3 F (36.3 C)   Resp 18   Ht 4\' 9"  (1.448 m)   Wt 172 lb 11.2 oz (78.3 kg)   SpO2 98%   BMI 37.37 kg/m  Outpatient Encounter Medications as of 04/05/2017  Medication Sig  . acetaminophen (TYLENOL) 325 MG tablet Take 650 mg by mouth every 6 (six) hours as needed for mild pain, moderate pain or fever.   . Amino Acids-Protein Hydrolys (FEEDING SUPPLEMENT, PRO-STAT SUGAR FREE 64,) LIQD Take 30 mLs by mouth 2 (two) times daily.  Marland Kitchen aspirin EC 325 MG tablet Take 1 tablet (325 mg total) by mouth daily.  Marland Kitchen atorvastatin (LIPITOR) 20 MG tablet Take 1 tablet (20 mg total) by mouth at bedtime.  . benztropine (COGENTIN) 1 MG tablet Take 1 mg by mouth at bedtime.  . Calcium Carbonate-Vitamin D (CALCIUM 600-D) 600-400 MG-UNIT per tablet Take 1 tablet by mouth at bedtime.   . cholecalciferol (VITAMIN D) 1000 UNITS tablet Take 1,000 Units by mouth at bedtime.   . cyanocobalamin 1000 MCG tablet Take 1,000 mcg by mouth daily with breakfast.   . docusate sodium (COLACE) 100 MG capsule Take 100 mg by mouth daily.  Marland Kitchen donepezil (ARICEPT) 10 MG tablet Take 10 mg by  mouth at bedtime.  Marland Kitchen escitalopram (LEXAPRO) 20 MG tablet Take 20 mg by mouth daily with breakfast.   . Hypromellose 0.4 % SOLN Place 1 drop into both eyes 3 (three) times daily.  . insulin aspart (NOVOLOG) 100 UNIT/ML injection Inject 8 Units into the skin 3 (three) times daily after meals. Notify MD if BS is less than 70 or greater than 450. Hold Insulin if BS is less than 70  . Insulin Detemir (LEVEMIR FLEXPEN) 100 UNIT/ML Pen Inject 10 Units into the skin at bedtime.   Marland Kitchen ipratropium (ATROVENT) 0.03 % nasal spray Place 2 sprays into both nostrils at bedtime. And at bedtime   . ipratropium (ATROVENT) 0.03 % nasal spray Place 2 sprays into both nostrils 3 (three) times daily before meals.  Marland Kitchen ipratropium-albuterol (DUONEB) 0.5-2.5 (3) MG/3ML SOLN Take 3 mLs by nebulization every 6 (six) hours as needed (for shortness of breath).   . loratadine (CLARITIN) 10 MG tablet Take 10 mg by mouth daily with breakfast.   . losartan (COZAAR) 50 MG tablet Take 50 mg by mouth daily with breakfast.   . Lotion Base LOTN Apply 1 application topically 2 (two) times daily.  . Maltodextrin-Xanthan Gum (RESOURCE THICKENUP CLEAR) POWD Take 120 g by mouth as needed.  . Memantine HCl ER (NAMENDA XR) 28 MG CP24 Take 28 mg by mouth daily with breakfast. For dementia  . Multiple Vitamins-Minerals (DECUBI-VITE) CAPS Take 1 capsule by mouth daily with breakfast.   . Nutritional Supplements (NUTRITIONAL SUPPLEMENT PO) Nectar thick liquids  . OLANZapine (ZYPREXA) 5 MG tablet Take 5 mg by mouth at bedtime.  Marland Kitchen omeprazole (PRILOSEC) 20 MG capsule Take 20 mg by mouth daily at 6 (six) AM.  . [DISCONTINUED] docusate sodium (COLACE) 100 MG capsule Take 1 capsule (100 mg total) by mouth daily. For constipation (Patient not taking: Reported on 04/05/2017)   No facility-administered encounter medications on file as of 04/05/2017.      SIGNIFICANT DIAGNOSTIC EXAMS  PREVIOUS  12-30-14: right eye intraocular injection     03-12-16:  EEG: This is a mildly abnormal EEG due to the presence of  moderate generalized slowing which is indicative of bihemispheric dysfunction which is a nonspecific finding seen in a variety of degenerative, hypoxic, ischemic, toxic, metabolic etiologies. No definite epileptiform activity is noted  12-03-16: chest x-ray: no acute cardiopulmonary disease  01-10-17: chest x-ray: no acute cardiopulmonary disease  TODAY:   04-02-17: ct of head:  1. No acute intracranial pathology seen on CT. 2. Mild to moderate cortical volume loss and scattered small vessel ischemic microangiopathy. 3. Partial opacification of the mastoid air cells bilaterally.  04-02-17: ct angio of head and neck: Minor extracranial and intracranial atheromatous change, not unexpected for age, and stable from 2017. No large vessel occlusion, dissection, or other significant vascular finding. Atrophy and small vessel disease without acute intracranial process.  04-02-17: MRI of brain: Atrophy and small vessel disease. No acute intracranial findings.     LABS REVIEWED: PREVIOUS   03-14-16: urine micro-albumin <1.2  03-27-16: glucose 126; bun 11.7; creat 0.98; k+ 4.5; na++ 143; hgb a1c 7.8  07-02-16: wbc 8.8; hgb 12.5; hct 37.9 ;mcv 85.6; plt 219; glucose 83; bun 22.5; creat 1.12; k+ 4.8; na++ 140; ca 0.1; liver  Normal albumin 3.4  hgb a1c 7.4 chol 148; ldl 75; trig 165; hdl 40  07-25-16: wbc 7.8; hgb 13.0; hct 39.9; mcv 84.9;  glucose 203; bun 15.5; creat 0.73; k+ 4.4 na++ 138; ca 9.1; hgb a1c 7.2  07-26-16: chol 140; ldl 92; trig 142; hdl 43   11-02-16: glucose 198; bun 17.0; creat 0.89; k+ 4.7; na++ 137; liver normal albumin 4.0 hgb a1c 6.9; chol 162; ldl 85; trig 157; hdl 45  TODAY ;  03-29-17: chol 162; ldl 75 trig 214; hdl 44; hgb a1c 9.4 04-02-17: wbc 8.5; hgb 14.2; hct 44.1; mcv 86.0; plt 213; glucose 175; bun 20; creat 1.10; k+ 4.2; na++ 138; ca 9.0; liver normal albumin 3.1; ammonia 14 04-04-17: wbc 8.7; hgb 11.1; hct  34.0; mcv 85.4; plt 188; glucose 97; bun 16; creat 0.87; k+ 3.7; na++137; ca 8.1    Review of Systems  Constitutional: Negative for malaise/fatigue.  Respiratory: Negative for cough and shortness of breath.   Cardiovascular: Negative for chest pain, palpitations and leg swelling.  Gastrointestinal: Negative for abdominal pain, constipation and heartburn.  Musculoskeletal: Negative for back pain, joint pain and myalgias.  Skin: Negative.   Neurological: Negative for dizziness.  Psychiatric/Behavioral: The patient is not nervous/anxious.      Physical Exam  Constitutional: She appears well-developed and well-nourished. No distress.  Neck: No thyromegaly present.  Cardiovascular: Normal rate, regular rhythm and intact distal pulses.  Murmur heard. 1/6  Pulmonary/Chest: Effort normal and breath sounds normal. No respiratory distress.  Abdominal: Soft. Bowel sounds are normal. She exhibits no distension. There is no tenderness.  Musculoskeletal: She exhibits no edema.  Is able to move all extremities   Lymphadenopathy:    She has no cervical adenopathy.  Neurological: She is alert.  Skin: Skin is warm and dry. She is not diaphoretic.  Right shin skin tear 4.1 x 3.5 x 0.1 cm  Psychiatric: She has a normal mood and affect.     ASSESSMENT/ PLAN:  TODAY:   1. Tremor: stable  she does not have a resting tremor or lip tremor . Will continue cogentin 1 mg nightly   2. Dyslipidemia: stable  ldl 75; trig 214  will continue lipitor 20 mg daily   3. Diabetes stable   hgb a1c 6.9 (previous 7.2)  Will continue levemir 10  units;  novolog  8 units with meals     4. Dysphagia:stable she is on nectar  liquids; no signs of aspiration present   5. Gerd: stable will continue prilosec 20  mg daily   6. Constipation stable : will continue colace daily    7. Depression: stable  will continue lexapro 20 mg daily will  monitor   8. Schizophrenia: is stable continue  zyprexa 5  mg nightly and  will monitor   Is followed by psych services.  Will continue cogentin 1 mg nightly for tardive dyskinesia and will monitor   9. Post nasal drip syndrome: is stable;   will continue atrovent nasal spray four times daily   10. Chronic allergic bronchitis: will continue claritin 10 mg daily  11. TIA: is presently stable; will continue asa 325 mg daily   12. Hypertension: b/p 152/80 is stable : will continue cozaar 50 mg daily   13. Dementia without behavior disturbance; is without change will continue aricept 10 mg daily and namenda xr 28 mg daily will not make changes will monitor   Her current weight is 172  pounds and is currently stable.      MD is aware of resident's narcotic use and is in agreement with current plan of care. We will attempt to wean resident as apropriate     Ok Edwards NP [facility]  Contact [phone] Monday through Friday 8am- 5pm  After hours call [phone]

## 2017-04-06 DIAGNOSIS — R1311 Dysphagia, oral phase: Secondary | ICD-10-CM | POA: Diagnosis not present

## 2017-04-06 DIAGNOSIS — R1319 Other dysphagia: Secondary | ICD-10-CM | POA: Diagnosis not present

## 2017-04-06 DIAGNOSIS — M6281 Muscle weakness (generalized): Secondary | ICD-10-CM | POA: Diagnosis not present

## 2017-04-06 DIAGNOSIS — F0391 Unspecified dementia with behavioral disturbance: Secondary | ICD-10-CM | POA: Diagnosis not present

## 2017-04-06 DIAGNOSIS — R1312 Dysphagia, oropharyngeal phase: Secondary | ICD-10-CM | POA: Diagnosis not present

## 2017-04-06 DIAGNOSIS — R489 Unspecified symbolic dysfunctions: Secondary | ICD-10-CM | POA: Diagnosis not present

## 2017-04-07 DIAGNOSIS — M6281 Muscle weakness (generalized): Secondary | ICD-10-CM | POA: Diagnosis not present

## 2017-04-07 DIAGNOSIS — R1319 Other dysphagia: Secondary | ICD-10-CM | POA: Diagnosis not present

## 2017-04-07 DIAGNOSIS — R1312 Dysphagia, oropharyngeal phase: Secondary | ICD-10-CM | POA: Diagnosis not present

## 2017-04-07 DIAGNOSIS — R1311 Dysphagia, oral phase: Secondary | ICD-10-CM | POA: Diagnosis not present

## 2017-04-07 DIAGNOSIS — F0391 Unspecified dementia with behavioral disturbance: Secondary | ICD-10-CM | POA: Diagnosis not present

## 2017-04-07 DIAGNOSIS — R489 Unspecified symbolic dysfunctions: Secondary | ICD-10-CM | POA: Diagnosis not present

## 2017-04-08 ENCOUNTER — Non-Acute Institutional Stay (SKILLED_NURSING_FACILITY): Payer: Medicare Other | Admitting: Internal Medicine

## 2017-04-08 ENCOUNTER — Encounter: Payer: Self-pay | Admitting: Internal Medicine

## 2017-04-08 DIAGNOSIS — R1319 Other dysphagia: Secondary | ICD-10-CM | POA: Diagnosis not present

## 2017-04-08 DIAGNOSIS — E119 Type 2 diabetes mellitus without complications: Secondary | ICD-10-CM

## 2017-04-08 DIAGNOSIS — F0150 Vascular dementia without behavioral disturbance: Secondary | ICD-10-CM

## 2017-04-08 DIAGNOSIS — F0391 Unspecified dementia with behavioral disturbance: Secondary | ICD-10-CM | POA: Diagnosis not present

## 2017-04-08 DIAGNOSIS — R1311 Dysphagia, oral phase: Secondary | ICD-10-CM | POA: Diagnosis not present

## 2017-04-08 DIAGNOSIS — Z794 Long term (current) use of insulin: Secondary | ICD-10-CM | POA: Diagnosis not present

## 2017-04-08 DIAGNOSIS — F209 Schizophrenia, unspecified: Secondary | ICD-10-CM

## 2017-04-08 DIAGNOSIS — E8809 Other disorders of plasma-protein metabolism, not elsewhere classified: Secondary | ICD-10-CM | POA: Diagnosis not present

## 2017-04-08 DIAGNOSIS — M6281 Muscle weakness (generalized): Secondary | ICD-10-CM | POA: Diagnosis not present

## 2017-04-08 DIAGNOSIS — R489 Unspecified symbolic dysfunctions: Secondary | ICD-10-CM | POA: Diagnosis not present

## 2017-04-08 DIAGNOSIS — R1312 Dysphagia, oropharyngeal phase: Secondary | ICD-10-CM | POA: Diagnosis not present

## 2017-04-08 NOTE — Progress Notes (Signed)
Patient ID: Debra Tran, female   DOB: 27-Dec-1927, 83 y.o.   MRN: [id number]  Provider:  DR Arletha Grippe Location:  [facility] Room Number: [place] of Service:  SNF (31)  PCP: Gildardo Cranker, DO Patient Care Team: Gildardo Cranker, DO as PCP - General (Internal Medicine) Hayden Pedro, MD as Attending Physician (Ophthalmology) Nyoka Cowden Phylis Bougie, NP as Nurse Practitioner (Nurse Practitioner) [facility] [place] ([place])  Extended Emergency Contact Information Primary Emergency Contact: Stoney,[name] Address: [address]          [place], [place] [address] [place] Phone: [phone] Relation: Daughter  Code Status: FULL CODE Goals of Care: Advanced Directive information Advanced Directives 04/05/2017  Does Patient Have a Medical Advance Directive? Yes  Type of Advance Directive -  Does patient want to make changes to medical advance directive? No - Patient declined  Copy of [place] in Chart? -  Would patient like information on creating a medical advance directive? -  Pre-existing out of facility DNR order (yellow form or pink MOST form) Pink MOST form placed in chart (order not valid for inpatient use)      Chief Complaint  Patient presents with  . Readmit To SNF    Readmit to [facility]    HPI: Patient is a 83 y.o. female seen today for REadmission to SNF following hospital stay for acute encephalopathy, DM, dementia, dysphagia, HTN, schizophrenia. CT head neg for acute process. MRI brain obtained and revealed no acute process; showed atrophy and small vessel disease. Cr 1.1-->0.87; albumin 3.7. She was returned to SNF for long term care.  Today she reports no concerns. No nursing issues. She is a poor historian due to dementia. Hx obtained from chart. No falls. She sleeps well and appetite is ok.  Tremor - related to TD. Stable on cogentin 1 mg nightly   Dyslipidemia  - stable on lipitor 20 mg daily. LDL 75; TG 214  DM - controlled. A1c 6.9% ; takes levemir 10  units;  novolog  8 units with meals     hx Dysphagia - stable on nectar  liquids; no signs of aspiration present   GERD - stable on prilosec 20  mg daily   Chronic Constipation - stable on colace daily    Depression - mood stable on lexapro 20 mg daily   Schizophrenia - mood/behavior stable on zyprexa 5  mg nightly; cogentin 1 mg nightly for tardive dyskinesia; followed by psych services  Chronic allergic rhinitis - stable on claritin 10 mg daily; atrovent nasal spray for post nasal drip  Hx TIA - stable on ASA 325 mg daily   Hypertension - BP stable on cozaar 50 mg daily   Dementia without behavior disturbance - stable on aricept 10 mg daily and namenda xr 28 mg daily. Albumin 3.1   Past Medical History:  Diagnosis Date  . Allergic rhinitis   . CATARACT 06/06/2006   Qualifier: Diagnosis of  By: Genene Churn MD, Janett Billow    . Depressive disorder   . Diabetes mellitus type 2 in obese ([place])   . Essential hypertension, benign 02/24/2015  . Fungal dermatitis   . Hyperlipidemia   . Macular degeneration   . Mild cognitive impairment   . Narcissism ([place])   . Normocytic anemia   . Phobia   . Schizophrenia, paranoid ([place])   . Stress incontinence, female    Past Surgical History:  Procedure Laterality  Date  . ABDOMINAL HYSTERECTOMY     due to fibroids  . Avastin Ingection Right 03/26/2017   In Right eye  . CATARACT EXTRACTION  6 16 2006  . CRYOTHERAPY  2 15 2006   facial AK  . hyperplastic   4 20 2006   AK shave biopsy  . intraocular injection  [id number]  . TOTAL ABDOMINAL HYSTERECTOMY W/ BILATERAL SALPINGOOPHORECTOMY     due to endometriosis  non malignant    reports that  has never smoked. she has never used smokeless tobacco. She reports that she does not drink alcohol or use drugs. Social History   Socioeconomic History  . Marital status: Divorced    Spouse name: Not on file    . Number of children: Not on file  . Years of education: Not on file  . Highest education level: Not on file  Social Needs  . Financial resource strain: Not on file  . Food insecurity - worry: Not on file  . Food insecurity - inability: Not on file  . Transportation needs - medical: Not on file  . Transportation needs - non-medical: Not on file  Occupational History  . Not on file  Tobacco Use  . Smoking status: Never Smoker  . Smokeless tobacco: Never Used  Substance and Sexual Activity  . Alcohol use: No  . Drug use: No  . Sexual activity: Not on file  Other Topics Concern  . Not on file  Social History Narrative  . Not on file    Functional Status Survey:    Family History  Problem Relation Age of Onset  . Heart disease Mother   . Heart disease Father   . Stroke Father   . Cancer Cousin        liver and colon  . Stroke Sister     Health Maintenance  Topic Date Due  . FOOT EXAM  08/28/2017 (Originally 08/10/2016)  . HEMOGLOBIN A1C  09/27/2017  . OPHTHALMOLOGY EXAM  11/28/2017  . TETANUS/TDAP  08/09/2024  . INFLUENZA VACCINE  Completed  . DEXA SCAN  Completed  . PNA vac Low Risk Adult  Completed    Allergies  Allergen Reactions  . Ace Inhibitors     Unknown reaction per MAR   . Ether Nausea And Vomiting    Outpatient Encounter Medications as of 04/08/2017  Medication Sig  . acetaminophen (TYLENOL) 325 MG tablet Take 650 mg by mouth every 6 (six) hours as needed for mild pain, moderate pain or fever.   . Amino Acids-Protein Hydrolys (FEEDING SUPPLEMENT, PRO-STAT SUGAR FREE 64,) LIQD Take 30 mLs by mouth 2 (two) times daily.  Marland Kitchen aspirin EC 325 MG tablet Take 1 tablet (325 mg total) by mouth daily.  Marland Kitchen atorvastatin (LIPITOR) 20 MG tablet Take 1 tablet (20 mg total) by mouth at bedtime.  . benztropine (COGENTIN) 1 MG tablet Take 1 mg by mouth at bedtime.  . Calcium Carbonate-Vitamin D (CALCIUM 600-D) 600-400 MG-UNIT per tablet Take 1 tablet by mouth at  bedtime.   . cholecalciferol (VITAMIN D) 1000 UNITS tablet Take 1,000 Units by mouth at bedtime.   . cyanocobalamin 1000 MCG tablet Take 1,000 mcg by mouth daily with breakfast.   . docusate sodium (COLACE) 100 MG capsule Take 100 mg by mouth daily.  Marland Kitchen donepezil (ARICEPT) 10 MG tablet Take 10 mg by mouth at bedtime.  Marland Kitchen escitalopram (LEXAPRO) 20 MG tablet Take 20 mg by mouth daily with breakfast.   . Hypromellose 0.4 %  SOLN Place 1 drop into both eyes 3 (three) times daily.  . insulin aspart (NOVOLOG) 100 UNIT/ML injection Inject 8 Units into the skin 3 (three) times daily after meals. Notify MD if BS is less than 70 or greater than 450. Hold Insulin if BS is less than 70  . Insulin Detemir (LEVEMIR FLEXPEN) 100 UNIT/ML Pen Inject 10 Units into the skin at bedtime.   Marland Kitchen ipratropium (ATROVENT) 0.03 % nasal spray Place 2 sprays into both nostrils at bedtime. And at bedtime   . ipratropium (ATROVENT) 0.03 % nasal spray Place 2 sprays into both nostrils 3 (three) times daily before meals.  Marland Kitchen ipratropium-albuterol (DUONEB) 0.5-2.5 (3) MG/3ML SOLN Take 3 mLs by nebulization every 6 (six) hours as needed (for shortness of breath).   . loratadine (CLARITIN) 10 MG tablet Take 10 mg by mouth daily with breakfast.   . losartan (COZAAR) 50 MG tablet Take 50 mg by mouth daily with breakfast.   . Lotion Base LOTN Apply 1 application topically 2 (two) times daily.  . Maltodextrin-Xanthan Gum (RESOURCE THICKENUP CLEAR) POWD Take 120 g by mouth as needed.  . Memantine HCl ER (NAMENDA XR) 28 MG CP24 Take 28 mg by mouth daily with breakfast. For dementia  . Multiple Vitamins-Minerals (DECUBI-VITE) CAPS Take 1 capsule by mouth daily with breakfast.   . Nutritional Supplements (NUTRITIONAL SUPPLEMENT PO) Nectar thick liquids  . OLANZapine (ZYPREXA) 5 MG tablet Take 5 mg by mouth at bedtime.  Marland Kitchen omeprazole (PRILOSEC) 20 MG capsule Take 20 mg by mouth daily at 6 (six) AM.   No facility-administered encounter  medications on file as of 04/08/2017.     Review of Systems  Unable to perform ROS: Dementia    Vitals:   04/08/17 0952  BP: (!) 152/80  Pulse: 60  Resp: 18  Temp: (!) 97.3 F (36.3 C)  TempSrc: Oral  SpO2: 98%  Weight: 172 lb 11.2 oz (78.3 kg)  Height: 4\' 9"  (1.448 m)   Body mass index is 37.37 kg/m. Physical Exam  Constitutional: She appears well-developed.  Frail appearing sitting in w/c in NAD  HENT:  Mouth/Throat: Oropharynx is clear and moist. No oropharyngeal exudate.  MMM; no oral thrush  Eyes: Pupils are equal, round, and reactive to light. No scleral icterus.  Neck: Neck supple. Carotid bruit is not present. No tracheal deviation present. No thyromegaly present.  Cardiovascular: Normal rate, regular rhythm and intact distal pulses. Exam reveals no gallop and no friction rub.  Murmur (1/6 SEM) heard. No LE edema b/l. no calf TTP.   Pulmonary/Chest: Effort normal and breath sounds normal. No stridor. No respiratory distress. She has no wheezes. She has no rales.  Abdominal: Soft. Normal appearance and bowel sounds are normal. She exhibits no distension and no mass. There is no hepatomegaly. There is no tenderness. There is no rigidity, no rebound and no guarding. No hernia.  obese  Musculoskeletal: She exhibits edema.  Lymphadenopathy:    She has no cervical adenopathy.  Neurological: She is alert.  Skin: Skin is warm and dry. No rash noted.  Left forehead abrasion noted - dsg c/d/i. No d/c  Psychiatric: She has a normal mood and affect. Her behavior is normal.    Labs reviewed: Basic Metabolic Panel: Recent Labs    04/02/17 0140 04/03/17 0418 04/04/17 0401  NA 138 138 137  K 4.2 4.3 3.7  CL 99* 104 105  CO2 33* 25 28  GLUCOSE 175* 108* 97  BUN 20 17 16  CREATININE 1.10* 0.87 0.87  CALCIUM 9.0 8.4* 8.1*   Liver Function Tests: Recent Labs    11/02/16 03/29/17 04/02/17 0244  AST 21 16 25   ALT 19 16 18   ALKPHOS 102 117 87  BILITOT  --   --  0.7   PROT  --   --  6.5  ALBUMIN  --   --  3.1*   No results for input(s): LIPASE, AMYLASE in the last 8760 hours. Recent Labs    04/02/17 0244  AMMONIA 14   CBC: Recent Labs    07/02/16 03/29/17 04/02/17 0140 04/04/17 0401  WBC 8.8 6.8 8.5 8.7  NEUTROABS 5 4 4.9  --   HGB 12.5 12.7 14.2 11.1*  HCT 38 39 44.1 34.0*  MCV  --   --  86.0 85.4  PLT 219 211 213 188   Cardiac Enzymes: No results for input(s): CKTOTAL, CKMB, CKMBINDEX, TROPONINI in the last 8760 hours. BNP: Invalid input(s): POCBNP Lab Results  Component Value Date   HGBA1C 9.4 03/29/2017   Lab Results  Component Value Date   TSH 1.593 08/31/2011   No results found for: VITAMINB12 No results found for: FOLATE No results found for: IRON, TIBC, FERRITIN  Imaging and Procedures obtained prior to SNF admission: Ct Angio Head W Or Wo Contrast  Result Date: 04/02/2017 CLINICAL DATA:  Acute encephalopathy, waxing and waning. Evaluate for large vessel occlusion. Unknown LKN.Comorbidities include diabetes mellitus type II, hypertension, and dementia. EXAM: CT ANGIOGRAPHY HEAD AND NECK TECHNIQUE: Multidetector CT imaging of the head and neck was performed using the standard protocol during bolus administration of intravenous contrast. Multiplanar CT image reconstructions and MIPs were obtained to evaluate the vascular anatomy. Carotid stenosis measurements (when applicable) are obtained utilizing NASCET criteria, using the distal internal carotid diameter as the denominator. CONTRAST:  54mL ISOVUE-370 IOPAMIDOL (ISOVUE-370) INJECTION 76% COMPARISON:  CT head earlier today.  CTA 02/06/2016. FINDINGS: CTA NECK Aortic arch: Standard branching. Imaged portion shows no evidence of aneurysm or dissection. No significant stenosis of the major arch vessel origins. Moderate calcification. Right carotid system: Calcific plaque, similar to priors. No evidence of dissection, stenosis (50% or greater) or occlusion. Left carotid system:  Calcific plaque, similar priors. No evidence of dissection, stenosis (50% or greater) or occlusion. Vertebral arteries: LEFT vertebral slightly larger, both patent. No significant ostial narrowing. No evidence of dissection, stenosis (50% or greater) or occlusion. Nonvascular soft tissues:  Noncontributory. CTA HEAD Anterior circulation: Calcification of the cavernous internal carotid arteries consistent with cerebrovascular atherosclerotic disease. No signs of intracranial large vessel occlusion. No significant stenosis, proximal occlusion, aneurysm, or vascular malformation. Posterior circulation: Both vertebrals contribute to formation of the basilar. Posterior cerebral arteries and cerebellar arteries appear unremarkable. No significant stenosis, proximal occlusion, aneurysm, or vascular malformation. Venous sinuses: As permitted by contrast timing, patent. Anatomic variants: None of significance. Delayed phase:   No abnormal intracranial enhancement. Review of the MIP images confirms the above findings IMPRESSION: Minor extracranial and intracranial atheromatous change, not unexpected for age, and stable from 2017. No large vessel occlusion, dissection, or other significant vascular finding. Atrophy and small vessel disease without acute intracranial process. Electronically Signed   By: Staci Righter M.D.   On: 04/02/2017 08:00   Ct Head Wo Contrast  Result Date: 04/02/2017 CLINICAL DATA:  Acute onset of altered level of consciousness. EXAM: CT HEAD WITHOUT CONTRAST TECHNIQUE: Contiguous axial images were obtained from the base of the skull through the vertex without intravenous contrast. COMPARISON:  CTA of the head, and MRI of the brain performed 02/06/2016 FINDINGS: Brain: No evidence of acute infarction, hemorrhage, hydrocephalus, extra-axial collection or mass lesion/mass effect. Prominence of the ventricles and sulci reflects mild to moderate cortical volume loss. Mild cerebellar atrophy is noted.  Scattered periventricular and subcortical matter change likely reflects small vessel ischemic microangiopathy. The brainstem and fourth ventricle are within normal limits. The basal ganglia are unremarkable in appearance. The cerebral hemispheres demonstrate grossly normal gray-white differentiation. No mass effect or midline shift is seen. Vascular: No hyperdense vessel or unexpected calcification. Skull: There is no evidence of fracture; visualized osseous structures are unremarkable in appearance. Sinuses/Orbits: The orbits are within normal limits. There is partial opacification of the mastoid air cells bilaterally. The paranasal sinuses are well-aerated. Other: No significant soft tissue abnormalities are seen. IMPRESSION: 1. No acute intracranial pathology seen on CT. 2. Mild to moderate cortical volume loss and scattered small vessel ischemic microangiopathy. 3. Partial opacification of the mastoid air cells bilaterally. Electronically Signed   By: Garald Balding M.D.   On: 04/02/2017 03:50   Ct Angio Neck W And/or Wo Contrast  Result Date: 04/02/2017 CLINICAL DATA:  Acute encephalopathy, waxing and waning. Evaluate for large vessel occlusion. Unknown LKN.Comorbidities include diabetes mellitus type II, hypertension, and dementia. EXAM: CT ANGIOGRAPHY HEAD AND NECK TECHNIQUE: Multidetector CT imaging of the head and neck was performed using the standard protocol during bolus administration of intravenous contrast. Multiplanar CT image reconstructions and MIPs were obtained to evaluate the vascular anatomy. Carotid stenosis measurements (when applicable) are obtained utilizing NASCET criteria, using the distal internal carotid diameter as the denominator. CONTRAST:  34mL ISOVUE-370 IOPAMIDOL (ISOVUE-370) INJECTION 76% COMPARISON:  CT head earlier today.  CTA 02/06/2016. FINDINGS: CTA NECK Aortic arch: Standard branching. Imaged portion shows no evidence of aneurysm or dissection. No significant stenosis  of the major arch vessel origins. Moderate calcification. Right carotid system: Calcific plaque, similar to priors. No evidence of dissection, stenosis (50% or greater) or occlusion. Left carotid system: Calcific plaque, similar priors. No evidence of dissection, stenosis (50% or greater) or occlusion. Vertebral arteries: LEFT vertebral slightly larger, both patent. No significant ostial narrowing. No evidence of dissection, stenosis (50% or greater) or occlusion. Nonvascular soft tissues:  Noncontributory. CTA HEAD Anterior circulation: Calcification of the cavernous internal carotid arteries consistent with cerebrovascular atherosclerotic disease. No signs of intracranial large vessel occlusion. No significant stenosis, proximal occlusion, aneurysm, or vascular malformation. Posterior circulation: Both vertebrals contribute to formation of the basilar. Posterior cerebral arteries and cerebellar arteries appear unremarkable. No significant stenosis, proximal occlusion, aneurysm, or vascular malformation. Venous sinuses: As permitted by contrast timing, patent. Anatomic variants: None of significance. Delayed phase:   No abnormal intracranial enhancement. Review of the MIP images confirms the above findings IMPRESSION: Minor extracranial and intracranial atheromatous change, not unexpected for age, and stable from 2017. No large vessel occlusion, dissection, or other significant vascular finding. Atrophy and small vessel disease without acute intracranial process. Electronically Signed   By: Staci Righter M.D.   On: 04/02/2017 08:00   Mr Brain Wo Contrast  Result Date: 04/02/2017 CLINICAL DATA:  Acute encephalopathy, waxing and waning. Comorbidities include diabetes, dementia, and hypertension as well as schizophrenia. EXAM: MRI HEAD WITHOUT CONTRAST TECHNIQUE: Multiplanar, multiecho pulse sequences of the brain and surrounding structures were obtained without intravenous contrast. COMPARISON:  CT head without  contrast as well as CTA head neck earlier today. FINDINGS: Brain: No evidence for acute infarction, hemorrhage, mass lesion, hydrocephalus, or  extra-axial fluid. Generalized atrophy. Moderate subcortical and periventricular T2 and FLAIR hyperintensities, likely chronic microvascular ischemic change. Vascular: Normal flow voids. Skull and upper cervical spine: Normal marrow signal. Sinuses/Orbits: Negative. Other: None. IMPRESSION: Atrophy and small vessel disease.  No acute intracranial findings. Electronically Signed   By: Staci Righter M.D.   On: 04/02/2017 09:27   Dg Chest Port 1 View  Result Date: 04/02/2017 CLINICAL DATA:  Altered mental status EXAM: PORTABLE CHEST 1 VIEW COMPARISON:  Chest radiograph 02/20/2017 FINDINGS: Retrocardiac left lung base opacities, likely atelectasis. No pleural effusion or pneumothorax. No pulmonary edema. IMPRESSION: No active disease. Electronically Signed   By: Ulyses Jarred M.D.   On: 04/02/2017 02:56    Assessment/Plan   ICD-10-CM   1. Vascular dementia without behavioral disturbance F01.50   2. Hypoalbuminemia E88.09   3. Controlled type 2 diabetes mellitus without complication, with long-term current use of insulin (HCC) E11.9    Z79.4   4. Schizophrenia, unspecified type ([place]) F20.9     Cont current meds as ordered  PT/OT/ST as ordered  Wound care as ordered  Psych services to follow  GOAL: long term care  Will follow  Labs/tests ordered: none    Khrystyne Arpin S. Perlie Gold  [facility] [address] [place], [place] [address] [phone] Cell (Monday-Friday 8 AM - 5 PM) [phone] After 5 PM and follow prompts

## 2017-04-10 DIAGNOSIS — M6281 Muscle weakness (generalized): Secondary | ICD-10-CM | POA: Diagnosis not present

## 2017-04-10 DIAGNOSIS — R1312 Dysphagia, oropharyngeal phase: Secondary | ICD-10-CM | POA: Diagnosis not present

## 2017-04-10 DIAGNOSIS — R1319 Other dysphagia: Secondary | ICD-10-CM | POA: Diagnosis not present

## 2017-04-10 DIAGNOSIS — R489 Unspecified symbolic dysfunctions: Secondary | ICD-10-CM | POA: Diagnosis not present

## 2017-04-10 DIAGNOSIS — R1311 Dysphagia, oral phase: Secondary | ICD-10-CM | POA: Diagnosis not present

## 2017-04-10 DIAGNOSIS — F0391 Unspecified dementia with behavioral disturbance: Secondary | ICD-10-CM | POA: Diagnosis not present

## 2017-04-11 DIAGNOSIS — R1312 Dysphagia, oropharyngeal phase: Secondary | ICD-10-CM | POA: Diagnosis not present

## 2017-04-11 DIAGNOSIS — F0391 Unspecified dementia with behavioral disturbance: Secondary | ICD-10-CM | POA: Diagnosis not present

## 2017-04-11 DIAGNOSIS — R489 Unspecified symbolic dysfunctions: Secondary | ICD-10-CM | POA: Diagnosis not present

## 2017-04-11 DIAGNOSIS — M6281 Muscle weakness (generalized): Secondary | ICD-10-CM | POA: Diagnosis not present

## 2017-04-11 DIAGNOSIS — R1319 Other dysphagia: Secondary | ICD-10-CM | POA: Diagnosis not present

## 2017-04-11 DIAGNOSIS — R1311 Dysphagia, oral phase: Secondary | ICD-10-CM | POA: Diagnosis not present

## 2017-04-13 DIAGNOSIS — R1311 Dysphagia, oral phase: Secondary | ICD-10-CM | POA: Diagnosis not present

## 2017-04-13 DIAGNOSIS — R489 Unspecified symbolic dysfunctions: Secondary | ICD-10-CM | POA: Diagnosis not present

## 2017-04-13 DIAGNOSIS — F0391 Unspecified dementia with behavioral disturbance: Secondary | ICD-10-CM | POA: Diagnosis not present

## 2017-04-13 DIAGNOSIS — R1319 Other dysphagia: Secondary | ICD-10-CM | POA: Diagnosis not present

## 2017-04-13 DIAGNOSIS — R1312 Dysphagia, oropharyngeal phase: Secondary | ICD-10-CM | POA: Diagnosis not present

## 2017-04-13 DIAGNOSIS — M6281 Muscle weakness (generalized): Secondary | ICD-10-CM | POA: Diagnosis not present

## 2017-04-14 DIAGNOSIS — R1319 Other dysphagia: Secondary | ICD-10-CM | POA: Diagnosis not present

## 2017-04-14 DIAGNOSIS — F0391 Unspecified dementia with behavioral disturbance: Secondary | ICD-10-CM | POA: Diagnosis not present

## 2017-04-14 DIAGNOSIS — R1311 Dysphagia, oral phase: Secondary | ICD-10-CM | POA: Diagnosis not present

## 2017-04-14 DIAGNOSIS — R489 Unspecified symbolic dysfunctions: Secondary | ICD-10-CM | POA: Diagnosis not present

## 2017-04-14 DIAGNOSIS — M6281 Muscle weakness (generalized): Secondary | ICD-10-CM | POA: Diagnosis not present

## 2017-04-14 DIAGNOSIS — R1312 Dysphagia, oropharyngeal phase: Secondary | ICD-10-CM | POA: Diagnosis not present

## 2017-04-16 DIAGNOSIS — R1312 Dysphagia, oropharyngeal phase: Secondary | ICD-10-CM | POA: Diagnosis not present

## 2017-04-16 DIAGNOSIS — R1319 Other dysphagia: Secondary | ICD-10-CM | POA: Diagnosis not present

## 2017-04-16 DIAGNOSIS — F0391 Unspecified dementia with behavioral disturbance: Secondary | ICD-10-CM | POA: Diagnosis not present

## 2017-04-16 DIAGNOSIS — R1311 Dysphagia, oral phase: Secondary | ICD-10-CM | POA: Diagnosis not present

## 2017-04-16 DIAGNOSIS — M6281 Muscle weakness (generalized): Secondary | ICD-10-CM | POA: Diagnosis not present

## 2017-04-16 DIAGNOSIS — R489 Unspecified symbolic dysfunctions: Secondary | ICD-10-CM | POA: Diagnosis not present

## 2017-04-17 DIAGNOSIS — R1319 Other dysphagia: Secondary | ICD-10-CM | POA: Diagnosis not present

## 2017-04-17 DIAGNOSIS — R1311 Dysphagia, oral phase: Secondary | ICD-10-CM | POA: Diagnosis not present

## 2017-04-17 DIAGNOSIS — R489 Unspecified symbolic dysfunctions: Secondary | ICD-10-CM | POA: Diagnosis not present

## 2017-04-17 DIAGNOSIS — F0391 Unspecified dementia with behavioral disturbance: Secondary | ICD-10-CM | POA: Diagnosis not present

## 2017-04-17 DIAGNOSIS — R1312 Dysphagia, oropharyngeal phase: Secondary | ICD-10-CM | POA: Diagnosis not present

## 2017-04-17 DIAGNOSIS — M6281 Muscle weakness (generalized): Secondary | ICD-10-CM | POA: Diagnosis not present

## 2017-04-19 ENCOUNTER — Encounter: Payer: Self-pay | Admitting: Adult Health

## 2017-04-19 NOTE — Progress Notes (Signed)
Entered in error

## 2017-04-22 ENCOUNTER — Telehealth: Payer: Self-pay | Admitting: Pulmonary Disease

## 2017-04-22 NOTE — Telephone Encounter (Signed)
Notes recorded by Marshell Garfinkel, MD on 02/21/2017 at 9:46 PM EST Please let the patient know that chest x-ray does not show any acute lung abnormality. ------------------------------------ Spoke with pt's daughter, Lorriane Shire She is aware of pt's results. Nothing further was needed.

## 2017-04-24 ENCOUNTER — Non-Acute Institutional Stay (SKILLED_NURSING_FACILITY): Payer: Medicare Other | Admitting: Adult Health

## 2017-04-24 ENCOUNTER — Ambulatory Visit: Payer: Self-pay | Admitting: Pulmonary Disease

## 2017-04-24 ENCOUNTER — Encounter: Payer: Self-pay | Admitting: Adult Health

## 2017-04-24 DIAGNOSIS — I10 Essential (primary) hypertension: Secondary | ICD-10-CM

## 2017-04-24 DIAGNOSIS — E119 Type 2 diabetes mellitus without complications: Secondary | ICD-10-CM | POA: Diagnosis not present

## 2017-04-24 DIAGNOSIS — E1169 Type 2 diabetes mellitus with other specified complication: Secondary | ICD-10-CM

## 2017-04-24 DIAGNOSIS — Z794 Long term (current) use of insulin: Secondary | ICD-10-CM | POA: Diagnosis not present

## 2017-04-24 DIAGNOSIS — E785 Hyperlipidemia, unspecified: Secondary | ICD-10-CM

## 2017-04-24 NOTE — Progress Notes (Signed)
Location:   [place] Room Number: 225 B Place of Service:  SNF (31)   CODE STATUS: Full Code  Allergies  Allergen Reactions  . Ace Inhibitors     Unknown reaction per MAR   . Ether Nausea And Vomiting    Chief Complaint  Patient presents with  . Acute Visit    Diabetes Mellitus    HPI:  She is being seen for the management of her diabetes. Her cbg readings have been elevated. There are no reports of missed doses of medication. She denies any excessive thirst; hunger; or excessive urination. There are no nursing concerns at this time.   Past Medical History:  Diagnosis Date  . Allergic rhinitis   . CATARACT 06/06/2006   Qualifier: Diagnosis of  By: Genene Churn MD, Janett Billow    . Depressive disorder   . Diabetes mellitus type 2 in obese ([place])   . Essential hypertension, benign 02/24/2015  . Fungal dermatitis   . Hyperlipidemia   . Macular degeneration   . Mild cognitive impairment   . Narcissism ([place])   . Normocytic anemia   . Phobia   . Schizophrenia, paranoid ([place])   . Stress incontinence, female     Past Surgical History:  Procedure Laterality Date  . ABDOMINAL HYSTERECTOMY     due to fibroids  . Avastin Ingection Right 03/26/2017   In Right eye  . CATARACT EXTRACTION  6 16 2006  . CRYOTHERAPY  2 15 2006   facial AK  . hyperplastic   4 20 2006   AK shave biopsy  . intraocular injection  [id number]  . TOTAL ABDOMINAL HYSTERECTOMY W/ BILATERAL SALPINGOOPHORECTOMY     due to endometriosis  non malignant    Social History   Socioeconomic History  . Marital status: Divorced    Spouse name: Not on file  . Number of children: Not on file  . Years of education: Not on file  . Highest education level: Not on file  Social Needs  . Financial resource strain: Not on file  . Food insecurity - worry: Not on file  . Food insecurity - inability: Not on file  . Transportation needs - medical: Not on file  . Transportation needs - non-medical: Not on file    Occupational History  . Not on file  Tobacco Use  . Smoking status: Never Smoker  . Smokeless tobacco: Never Used  Substance and Sexual Activity  . Alcohol use: No  . Drug use: No  . Sexual activity: Not on file  Other Topics Concern  . Not on file  Social History Narrative  . Not on file   Family History  Problem Relation Age of Onset  . Heart disease Mother   . Heart disease Father   . Stroke Father   . Cancer Cousin        liver and colon  . Stroke Sister       VITAL SIGNS BP (!) 152/80   Pulse 60   Temp (!) 97.3 F (36.3 C)   Resp 18   Ht 4\' 9"  (1.448 m)   Wt 172 lb 11.2 oz (78.3 kg)   SpO2 98%   BMI 37.37 kg/m   Outpatient Encounter Medications as of 04/24/2017  Medication Sig  . acetaminophen (TYLENOL) 325 MG tablet Take 650 mg by mouth every 6 (six) hours as needed for mild pain, moderate pain or fever.   . Amino Acids-Protein Hydrolys (FEEDING SUPPLEMENT, PRO-STAT SUGAR FREE 64,) LIQD Take  30 mLs by mouth 2 (two) times daily.  Marland Kitchen aspirin EC 325 MG tablet Take 1 tablet (325 mg total) by mouth daily.  Marland Kitchen atorvastatin (LIPITOR) 20 MG tablet Take 1 tablet (20 mg total) by mouth at bedtime.  . benztropine (COGENTIN) 1 MG tablet Take 1 mg by mouth at bedtime.  . Calcium Carbonate-Vitamin D (CALCIUM 600-D) 600-400 MG-UNIT per tablet Take 1 tablet by mouth at bedtime.   . cholecalciferol (VITAMIN D) 1000 UNITS tablet Take 1,000 Units by mouth at bedtime.   . cyanocobalamin 1000 MCG tablet Take 1,000 mcg by mouth daily with breakfast.   . docusate sodium (COLACE) 100 MG capsule Take 100 mg by mouth daily.  Marland Kitchen donepezil (ARICEPT) 10 MG tablet Take 10 mg by mouth at bedtime.  Marland Kitchen escitalopram (LEXAPRO) 20 MG tablet Take 20 mg by mouth daily with breakfast.   . Hypromellose 0.4 % SOLN Place 1 drop into both eyes 3 (three) times daily.  . insulin aspart (NOVOLOG) 100 UNIT/ML injection Inject 8 Units into the skin 3 (three) times daily after meals. Notify MD if BS is less  than 70 or greater than 450. Hold Insulin if BS is less than 70  . Insulin Detemir (LEVEMIR FLEXPEN) 100 UNIT/ML Pen Inject 10 Units into the skin at bedtime.   Marland Kitchen ipratropium (ATROVENT) 0.03 % nasal spray Place 2 sprays into both nostrils at bedtime. And at bedtime   . ipratropium (ATROVENT) 0.03 % nasal spray Place 2 sprays into both nostrils 3 (three) times daily before meals.  Marland Kitchen ipratropium-albuterol (DUONEB) 0.5-2.5 (3) MG/3ML SOLN Take 3 mLs by nebulization every 6 (six) hours as needed (for shortness of breath).   . loratadine (CLARITIN) 10 MG tablet Take 10 mg by mouth daily with breakfast.   . losartan (COZAAR) 50 MG tablet Take 50 mg by mouth daily with breakfast.   . Lotion Base LOTN Apply 1 application topically 2 (two) times daily.  . Memantine HCl ER (NAMENDA XR) 28 MG CP24 Take 28 mg by mouth daily with breakfast. For dementia  . Multiple Vitamins-Minerals (DECUBI-VITE) CAPS Take 1 capsule by mouth daily with breakfast.   . Nutritional Supplements (NUTRITIONAL SUPPLEMENT PO) Nectar thick liquids  . OLANZapine (ZYPREXA) 5 MG tablet Take 5 mg by mouth at bedtime.  Marland Kitchen omeprazole (PRILOSEC) 20 MG capsule Take 20 mg by mouth daily at 6 (six) AM.  . [DISCONTINUED] Insulin Glargine (TOUJEO MAX SOLOSTAR) 300 UNIT/ML SOPN Inject 92 Units into the skin at bedtime. This is long acting - DO NOT HOLD  . [DISCONTINUED] Maltodextrin-Xanthan Gum (RESOURCE THICKENUP CLEAR) POWD Take 120 g by mouth as needed. (Patient not taking: Reported on 04/24/2017)   No facility-administered encounter medications on file as of 04/24/2017.      SIGNIFICANT DIAGNOSTIC EXAMS  PREVIOUS  12-30-14: right eye intraocular injection     03-12-16: EEG: This is a mildly abnormal EEG due to the presence of  moderate generalized slowing which is indicative of bihemispheric dysfunction which is a nonspecific finding seen in a variety of degenerative, hypoxic, ischemic, toxic, metabolic etiologies. No definite epileptiform  activity is noted  12-03-16: chest x-ray: no acute cardiopulmonary disease  01-10-17: chest x-ray: no acute cardiopulmonary disease  04-02-17: ct of head:  1. No acute intracranial pathology seen on CT. 2. Mild to moderate cortical volume loss and scattered small vessel ischemic microangiopathy. 3. Partial opacification of the mastoid air cells bilaterally.  04-02-17: ct angio of head and neck: Minor extracranial and intracranial atheromatous  change, not unexpected for age, and stable from 2017. No large vessel occlusion, dissection, or other significant vascular finding. Atrophy and small vessel disease without acute intracranial process.  04-02-17: MRI of brain: Atrophy and small vessel disease. No acute intracranial findings.  NO NEW EXAMS      LABS REVIEWED: PREVIOUS   07-02-16: wbc 8.8; hgb 12.5; hct 37.9 ;mcv 85.6; plt 219; glucose 83; bun 22.5; creat 1.12; k+ 4.8; na++ 140; ca 0.1; liver  Normal albumin 3.4  hgb a1c 7.4 chol 148; ldl 75; trig 165; hdl 40  07-25-16: wbc 7.8; hgb 13.0; hct 39.9; mcv 84.9;  glucose 203; bun 15.5; creat 0.73; k+ 4.4 na++ 138; ca 9.1; hgb a1c 7.2  07-26-16: chol 140; ldl 92; trig 142; hdl 43   11-02-16: glucose 198; bun 17.0; creat 0.89; k+ 4.7; na++ 137; liver normal albumin 4.0 hgb a1c 6.9; chol 162; ldl 85; trig 157; hdl 45 03-29-17: chol 162; ldl 75 trig 214; hdl 44; hgb a1c 9.4 04-02-17: wbc 8.5; hgb 14.2; hct 44.1; mcv 86.0; plt 213; glucose 175; bun 20; creat 1.10; k+ 4.2; na++ 138; ca 9.0; liver normal albumin 3.1; ammonia 14 04-04-17: wbc 8.7; hgb 11.1; hct 34.0; mcv 85.4; plt 188; glucose 97; bun 16; creat 0.87; k+ 3.7; na++137; ca 8.1  NO NEW LABS     Review of Systems  Constitutional: Negative for malaise/fatigue.  Respiratory: Negative for cough and shortness of breath.   Cardiovascular: Negative for chest pain, palpitations and leg swelling.  Gastrointestinal: Negative for abdominal pain, constipation and heartburn.  Musculoskeletal:  Negative for back pain, joint pain and myalgias.  Skin: Negative.   Neurological: Negative for dizziness.  Psychiatric/Behavioral: The patient is not nervous/anxious.    Physical Exam  Constitutional: She appears well-developed and well-nourished. No distress.  Neck: No thyromegaly present.  Cardiovascular: Normal rate, regular rhythm and intact distal pulses.  Murmur heard. 1/6  Pulmonary/Chest: Effort normal and breath sounds normal. No respiratory distress.  Abdominal: Soft. Bowel sounds are normal. She exhibits no distension. There is no tenderness.  Musculoskeletal: She exhibits no edema.  Is able to move all extremities   Lymphadenopathy:    She has no cervical adenopathy.  Neurological: She is alert.  Skin: Skin is warm and dry. She is not diaphoretic.  Psychiatric: She has a normal mood and affect.   ASSESSMENT/ PLAN:  TODAY:   1.  Hyperlipidemia associated with type 2 diabetes mellitus: stable  ldl 75; trig 214  will continue lipitor 20 mg daily   2. Controlled diabetes mellitus type II without complication with long term use of insulin:    hgb a1c 6.9 (previous 7.2)  Will continue  novolog  8 units with meals   Will increase levemir to 14 units nightly due to her elevated cbg readings. Is on statin and arb and asa   3.  Essential hypertension, benign: b/p 152/80 is stable : will continue cozaar 50 mg daily    MD is aware of resident's narcotic use and is in agreement with current plan of care. We will attempt to wean resident as apropriate     Ok Edwards NP [facility]  Contact [phone] Monday through Friday 8am- 5pm  After hours call [phone]

## 2017-04-25 ENCOUNTER — Encounter (INDEPENDENT_AMBULATORY_CARE_PROVIDER_SITE_OTHER): Payer: Medicare Other | Admitting: Ophthalmology

## 2017-04-25 DIAGNOSIS — H35033 Hypertensive retinopathy, bilateral: Secondary | ICD-10-CM | POA: Diagnosis not present

## 2017-04-25 DIAGNOSIS — I10 Essential (primary) hypertension: Secondary | ICD-10-CM | POA: Diagnosis not present

## 2017-04-25 DIAGNOSIS — H43813 Vitreous degeneration, bilateral: Secondary | ICD-10-CM

## 2017-04-25 DIAGNOSIS — H353231 Exudative age-related macular degeneration, bilateral, with active choroidal neovascularization: Secondary | ICD-10-CM | POA: Diagnosis not present

## 2017-05-02 ENCOUNTER — Non-Acute Institutional Stay (SKILLED_NURSING_FACILITY): Payer: Medicare Other | Admitting: Adult Health

## 2017-05-02 ENCOUNTER — Encounter: Payer: Self-pay | Admitting: Adult Health

## 2017-05-02 DIAGNOSIS — G2401 Drug induced subacute dyskinesia: Secondary | ICD-10-CM

## 2017-05-02 DIAGNOSIS — E119 Type 2 diabetes mellitus without complications: Secondary | ICD-10-CM | POA: Diagnosis not present

## 2017-05-02 DIAGNOSIS — E1169 Type 2 diabetes mellitus with other specified complication: Secondary | ICD-10-CM | POA: Diagnosis not present

## 2017-05-02 DIAGNOSIS — E785 Hyperlipidemia, unspecified: Secondary | ICD-10-CM

## 2017-05-02 DIAGNOSIS — Z20828 Contact with and (suspected) exposure to other viral communicable diseases: Secondary | ICD-10-CM | POA: Diagnosis not present

## 2017-05-02 DIAGNOSIS — Z794 Long term (current) use of insulin: Secondary | ICD-10-CM | POA: Diagnosis not present

## 2017-05-02 NOTE — Progress Notes (Signed)
Location:   [place] Room Number: 225 B Place of Service:  SNF (31)   CODE STATUS: Full Code (MOST updated 05-02-17)  Allergies  Allergen Reactions  . Ace Inhibitors     Unknown reaction per MAR   . Ether Nausea And Vomiting    Chief Complaint  Patient presents with  . Medical Management of Chronic Issues    Tardive dyskinesia; hyperlipidemia; diabetes;     HPI:  She is a 84 year old long term resident of this facility being seen for the management of her chronic illnesses: tardive dyskinesia; hyperlipidemia; diabetes. She denies any fevers; no cough; no shortness of breath; no body aches or pain.  We have had a care plan meeting. Her daughter has had numerous concerns about issues over the past several months; which have been addressed. We did discuss her medications; medical status; her last hospitalization. We did review her MOST form; she did verbalize understanding and no changes were made.   Past Medical History:  Diagnosis Date  . Allergic rhinitis   . CATARACT 06/06/2006   Qualifier: Diagnosis of  By: Genene Churn MD, Janett Billow    . Depressive disorder   . Diabetes mellitus type 2 in obese ([place])   . Essential hypertension, benign 02/24/2015  . Fungal dermatitis   . Hyperlipidemia   . Macular degeneration   . Mild cognitive impairment   . Narcissism ([place])   . Normocytic anemia   . Phobia   . Schizophrenia, paranoid ([place])   . Stress incontinence, female     Past Surgical History:  Procedure Laterality Date  . ABDOMINAL HYSTERECTOMY     due to fibroids  . Avastin Ingection Right 03/26/2017   In Right eye  . CATARACT EXTRACTION  6 16 2006  . CRYOTHERAPY  2 15 2006   facial AK  . hyperplastic   4 20 2006   AK shave biopsy  . intraocular injection  [id number]  . TOTAL ABDOMINAL HYSTERECTOMY W/ BILATERAL SALPINGOOPHORECTOMY     due to endometriosis  non malignant    Social History   Socioeconomic History  . Marital status: Divorced    Spouse name:  Not on file  . Number of children: Not on file  . Years of education: Not on file  . Highest education level: Not on file  Social Needs  . Financial resource strain: Not on file  . Food insecurity - worry: Not on file  . Food insecurity - inability: Not on file  . Transportation needs - medical: Not on file  . Transportation needs - non-medical: Not on file  Occupational History  . Not on file  Tobacco Use  . Smoking status: Never Smoker  . Smokeless tobacco: Never Used  Substance and Sexual Activity  . Alcohol use: No  . Drug use: No  . Sexual activity: Not on file  Other Topics Concern  . Not on file  Social History Narrative  . Not on file   Family History  Problem Relation Age of Onset  . Heart disease Mother   . Heart disease Father   . Stroke Father   . Cancer Cousin        liver and colon  . Stroke Sister       VITAL SIGNS BP (!) 152/80   Pulse 60   Temp (!) 97.3 F (36.3 C)   Resp 18   Ht 4\' 9"  (1.448 m)   Wt 173 lb (78.5 kg)   SpO2 98%  BMI 37.44 kg/m   Outpatient Encounter Medications as of 05/02/2017  Medication Sig  . acetaminophen (TYLENOL) 325 MG tablet Take 650 mg by mouth every 6 (six) hours as needed for mild pain, moderate pain or fever.   . Amino Acids-Protein Hydrolys (FEEDING SUPPLEMENT, PRO-STAT SUGAR FREE 64,) LIQD Take 30 mLs by mouth 2 (two) times daily.  Marland Kitchen aspirin EC 325 MG tablet Take 1 tablet (325 mg total) by mouth daily.  Marland Kitchen atorvastatin (LIPITOR) 20 MG tablet Take 1 tablet (20 mg total) by mouth at bedtime.  . benztropine (COGENTIN) 1 MG tablet Take 1 mg by mouth at bedtime.  . Calcium Carbonate-Vitamin D (CALCIUM 600-D) 600-400 MG-UNIT per tablet Take 1 tablet by mouth at bedtime.   . cholecalciferol (VITAMIN D) 1000 UNITS tablet Take 1,000 Units by mouth at bedtime.   . cyanocobalamin 1000 MCG tablet Take 1,000 mcg by mouth daily with breakfast.   . docusate sodium (COLACE) 100 MG capsule Take 100 mg by mouth daily.  Marland Kitchen  donepezil (ARICEPT) 10 MG tablet Take 10 mg by mouth at bedtime.  Marland Kitchen escitalopram (LEXAPRO) 20 MG tablet Take 20 mg by mouth daily with breakfast.   . Hypromellose 0.4 % SOLN Place 1 drop into both eyes 3 (three) times daily.  . insulin aspart (NOVOLOG) 100 UNIT/ML injection Inject 8 Units into the skin 3 (three) times daily after meals. Notify MD if BS is less than 70 or greater than 450. Hold Insulin if BS is less than 70  . Insulin Detemir (LEVEMIR FLEXPEN) 100 UNIT/ML Pen Inject 14 Units into the skin at bedtime.   Marland Kitchen ipratropium (ATROVENT) 0.03 % nasal spray Place 2 sprays into both nostrils at bedtime. And at bedtime   . ipratropium (ATROVENT) 0.03 % nasal spray Place 2 sprays into both nostrils 3 (three) times daily before meals.  Marland Kitchen ipratropium-albuterol (DUONEB) 0.5-2.5 (3) MG/3ML SOLN Take 3 mLs by nebulization every 6 (six) hours as needed (for shortness of breath).   . loratadine (CLARITIN) 10 MG tablet Take 10 mg by mouth daily with breakfast.   . losartan (COZAAR) 50 MG tablet Take 50 mg by mouth daily with breakfast.   . Lotion Base LOTN Apply 1 application topically 2 (two) times daily.  . Memantine HCl ER (NAMENDA XR) 28 MG CP24 Take 28 mg by mouth daily with breakfast. For dementia  . Multiple Vitamins-Minerals (DECUBI-VITE) CAPS Take 1 capsule by mouth daily with breakfast.   . Nutritional Supplements (NUTRITIONAL SUPPLEMENT PO) Nectar thick liquids  . OLANZapine (ZYPREXA) 5 MG tablet Take 5 mg by mouth at bedtime.  Marland Kitchen omeprazole (PRILOSEC) 20 MG capsule Take 20 mg by mouth daily at 6 (six) AM.   No facility-administered encounter medications on file as of 05/02/2017.      SIGNIFICANT DIAGNOSTIC EXAMS   PREVIOUS  12-30-14: right eye intraocular injection     03-12-16: EEG: This is a mildly abnormal EEG due to the presence of  moderate generalized slowing which is indicative of bihemispheric dysfunction which is a nonspecific finding seen in a variety of degenerative, hypoxic,  ischemic, toxic, metabolic etiologies. No definite epileptiform activity is noted  12-03-16: chest x-ray: no acute cardiopulmonary disease  01-10-17: chest x-ray: no acute cardiopulmonary disease  04-02-17: ct of head:  1. No acute intracranial pathology seen on CT. 2. Mild to moderate cortical volume loss and scattered small vessel ischemic microangiopathy. 3. Partial opacification of the mastoid air cells bilaterally.  04-02-17: ct angio of head and neck:  Minor extracranial and intracranial atheromatous change, not unexpected for age, and stable from 2017. No large vessel occlusion, dissection, or other significant vascular finding. Atrophy and small vessel disease without acute intracranial process.  04-02-17: MRI of brain: Atrophy and small vessel disease. No acute intracranial findings.  NO NEW EXAMS      LABS REVIEWED: PREVIOUS   07-02-16: wbc 8.8; hgb 12.5; hct 37.9 ;mcv 85.6; plt 219; glucose 83; bun 22.5; creat 1.12; k+ 4.8; na++ 140; ca 0.1; liver  Normal albumin 3.4  hgb a1c 7.4 chol 148; ldl 75; trig 165; hdl 40  07-25-16: wbc 7.8; hgb 13.0; hct 39.9; mcv 84.9;  glucose 203; bun 15.5; creat 0.73; k+ 4.4 na++ 138; ca 9.1; hgb a1c 7.2  07-26-16: chol 140; ldl 92; trig 142; hdl 43   11-02-16: glucose 198; bun 17.0; creat 0.89; k+ 4.7; na++ 137; liver normal albumin 4.0 hgb a1c 6.9; chol 162; ldl 85; trig 157; hdl 45 03-29-17: chol 162; ldl 75 trig 214; hdl 44; hgb a1c 9.4 04-02-17: wbc 8.5; hgb 14.2; hct 44.1; mcv 86.0; plt 213; glucose 175; bun 20; creat 1.10; k+ 4.2; na++ 138; ca 9.0; liver normal albumin 3.1; ammonia 14 04-04-17: wbc 8.7; hgb 11.1; hct 34.0; mcv 85.4; plt 188; glucose 97; bun 16; creat 0.87; k+ 3.7; na++137; ca 8.1  NO NEW LABS     Review of Systems  Constitutional: Negative for malaise/fatigue.  Respiratory: Negative for cough and shortness of breath.   Cardiovascular: Negative for chest pain, palpitations and leg swelling.  Gastrointestinal: Negative for  abdominal pain, constipation and heartburn.  Musculoskeletal: Negative for back pain, joint pain and myalgias.  Skin: Negative.   Neurological: Negative for dizziness.  Psychiatric/Behavioral: The patient is not nervous/anxious.     Physical Exam  Constitutional: She appears well-developed and well-nourished. No distress.  Neck: No thyromegaly present.  Cardiovascular: Normal rate, regular rhythm and intact distal pulses.  Murmur heard. 1/6  Pulmonary/Chest: Effort normal and breath sounds normal. No respiratory distress.  Abdominal: Soft. Bowel sounds are normal. She exhibits no distension. There is no tenderness.  Musculoskeletal: She exhibits no edema.  Is able to move all extremities   Lymphadenopathy:    She has no cervical adenopathy.  Neurological: She is alert.  Skin: Skin is warm and dry. She is not diaphoretic.  Psychiatric: She has a normal mood and affect.    ASSESSMENT/ PLAN:  TODAY:   1. Tardive dyskinesia: stable  she does not have a resting tremor or lip tremor . Will continue cogentin 1 mg nightly   2. Hyperlipidemia associated with type 2 diabetes mellitus: stable  ldl 75; trig 214  will continue lipitor 20 mg daily   3. Controlled diabetes type II without complication with long term insulin use:  stable  hgb a1c 6.9 (previous 7.2)  Will continue levemir 14  units;  novolog  8 units with meals   Is on sa statin and arb  4. Exposure to influenza: will begin tamiflu 75 mg daily for 14 days. Will monitor   PREVIOUS    5. Dysphagia, oropharyngeal phase:stable she is on nectar  liquids; no signs of aspiration present   6. Gerd without esophagitis: stable will continue prilosec 20  mg daily   7. Chronic constipation stable : will continue colace daily    8. Depressive dissorder: stable  will continue lexapro 20 mg daily will monitor   9. Schizophrenia: is stable continue  zyprexa 5  mg nightly and will  monitor   Is followed by psych services.  Will continue  cogentin 1 mg nightly for tardive dyskinesia and will monitor   10. Post nasal drip syndrome: is stable;   will continue atrovent nasal spray four times daily   11. Chronic allergic bronchitis: will continue claritin 10 mg daily  12. TIA: is presently stable; will continue asa 325 mg daily   13. Essential hypertension benign: b/p 152/80 is stable : will continue cozaar 50 mg daily   14. Dementia without behavior disturbance; is without change will continue aricept 10 mg daily and namenda xr 28 mg daily will not make changes will monitor   Her current weight is 173  pounds and is currently stable.    Time spent with patient and family: 45 minutes: discussed MOST form; medications; medical status; nursing concerns has verbalized understanding.    MD is aware of resident's narcotic use and is in agreement with current plan of care. We will attempt to wean resident as apropriate   Ok Edwards NP [facility]  Contact [phone] Monday through Friday 8am- 5pm  After hours call [phone]

## 2017-05-09 ENCOUNTER — Non-Acute Institutional Stay (SKILLED_NURSING_FACILITY): Payer: Medicare Other | Admitting: Internal Medicine

## 2017-05-09 DIAGNOSIS — I10 Essential (primary) hypertension: Secondary | ICD-10-CM

## 2017-05-09 DIAGNOSIS — F0150 Vascular dementia without behavioral disturbance: Secondary | ICD-10-CM

## 2017-05-09 DIAGNOSIS — E785 Hyperlipidemia, unspecified: Secondary | ICD-10-CM

## 2017-05-09 DIAGNOSIS — E1169 Type 2 diabetes mellitus with other specified complication: Secondary | ICD-10-CM | POA: Diagnosis not present

## 2017-05-09 DIAGNOSIS — F209 Schizophrenia, unspecified: Secondary | ICD-10-CM

## 2017-05-09 DIAGNOSIS — Z794 Long term (current) use of insulin: Secondary | ICD-10-CM

## 2017-05-09 DIAGNOSIS — E119 Type 2 diabetes mellitus without complications: Secondary | ICD-10-CM

## 2017-05-10 DIAGNOSIS — I10 Essential (primary) hypertension: Secondary | ICD-10-CM | POA: Diagnosis not present

## 2017-05-10 DIAGNOSIS — D649 Anemia, unspecified: Secondary | ICD-10-CM | POA: Diagnosis not present

## 2017-05-10 LAB — BASIC METABOLIC PANEL
BUN: 24 — AB (ref 4–21)
Creatinine: 0.9 (ref 0.5–1.1)
GLUCOSE: 210
Potassium: 4.4 (ref 3.4–5.3)
Sodium: 140 (ref 137–147)

## 2017-05-10 LAB — CBC AND DIFFERENTIAL
HCT: 40 (ref 36–46)
Hemoglobin: 12.7 (ref 12.0–16.0)
NEUTROS ABS: 4
PLATELETS: 189 (ref 150–399)
WBC: 7.3

## 2017-05-13 ENCOUNTER — Ambulatory Visit: Payer: Medicare Other | Admitting: Podiatry

## 2017-05-14 DIAGNOSIS — F0151 Vascular dementia with behavioral disturbance: Secondary | ICD-10-CM | POA: Diagnosis not present

## 2017-05-14 DIAGNOSIS — F251 Schizoaffective disorder, depressive type: Secondary | ICD-10-CM | POA: Diagnosis not present

## 2017-05-17 ENCOUNTER — Ambulatory Visit (INDEPENDENT_AMBULATORY_CARE_PROVIDER_SITE_OTHER): Payer: Medicare Other | Admitting: Podiatry

## 2017-05-17 ENCOUNTER — Encounter: Payer: Self-pay | Admitting: Podiatry

## 2017-05-17 DIAGNOSIS — M79674 Pain in right toe(s): Secondary | ICD-10-CM | POA: Diagnosis not present

## 2017-05-17 DIAGNOSIS — M79675 Pain in left toe(s): Secondary | ICD-10-CM | POA: Diagnosis not present

## 2017-05-17 DIAGNOSIS — B351 Tinea unguium: Secondary | ICD-10-CM | POA: Diagnosis not present

## 2017-05-17 NOTE — Progress Notes (Signed)
Subjective:   Patient ID: Debra Tran, female   DOB: 84 y.o.   MRN: [id number]   HPI Patient presents with incurvated nailbeds with Tran 1-5 both feet that she cannot take care of herself and they are sore with any form of shoe gear   ROS      Objective:  Physical Exam  Neurovascular status unchanged with thick yellow brittle nailbeds 1-5 both feet that are painful     Assessment:  Mycotic nail infection with Tran 1-5 both feet     Plan:  Debride painful nailbeds 1-5 both feet with no iatrogenic bleeding noted

## 2017-05-23 ENCOUNTER — Encounter (INDEPENDENT_AMBULATORY_CARE_PROVIDER_SITE_OTHER): Payer: Medicare Other | Admitting: Ophthalmology

## 2017-05-23 DIAGNOSIS — H43813 Vitreous degeneration, bilateral: Secondary | ICD-10-CM

## 2017-05-23 DIAGNOSIS — H35033 Hypertensive retinopathy, bilateral: Secondary | ICD-10-CM | POA: Diagnosis not present

## 2017-05-23 DIAGNOSIS — I10 Essential (primary) hypertension: Secondary | ICD-10-CM | POA: Diagnosis not present

## 2017-05-23 DIAGNOSIS — H353231 Exudative age-related macular degeneration, bilateral, with active choroidal neovascularization: Secondary | ICD-10-CM | POA: Diagnosis not present

## 2017-06-04 DIAGNOSIS — F251 Schizoaffective disorder, depressive type: Secondary | ICD-10-CM | POA: Diagnosis not present

## 2017-06-04 DIAGNOSIS — F0151 Vascular dementia with behavioral disturbance: Secondary | ICD-10-CM | POA: Diagnosis not present

## 2017-06-05 ENCOUNTER — Encounter: Payer: Self-pay | Admitting: Adult Health

## 2017-06-05 ENCOUNTER — Non-Acute Institutional Stay (SKILLED_NURSING_FACILITY): Payer: Medicare Other | Admitting: Adult Health

## 2017-06-05 DIAGNOSIS — K219 Gastro-esophageal reflux disease without esophagitis: Secondary | ICD-10-CM | POA: Diagnosis not present

## 2017-06-05 DIAGNOSIS — R1312 Dysphagia, oropharyngeal phase: Secondary | ICD-10-CM

## 2017-06-05 DIAGNOSIS — F329 Major depressive disorder, single episode, unspecified: Secondary | ICD-10-CM | POA: Diagnosis not present

## 2017-06-05 DIAGNOSIS — K5909 Other constipation: Secondary | ICD-10-CM | POA: Diagnosis not present

## 2017-06-05 DIAGNOSIS — F32A Depression, unspecified: Secondary | ICD-10-CM

## 2017-06-05 NOTE — Progress Notes (Signed)
Location:   [place] Room Number: 545 GYBWL of Service:  SNF (31)   CODE STATUS: full code (MOST reviewed 05-02-17)  Allergies  Allergen Reactions  . Ace Inhibitors     Unknown reaction per MAR   . Ether Nausea And Vomiting    Chief Complaint  Patient presents with  . Medical Management of Chronic Issues    Dysphagia; constipation; gerd; depression     HPI:  She is a 84 year old long term resident of this facility being seen for the management of her chronic illnesses: dysphagia; constipation; gerd and depression. She denies any issues with heartburn; or constipation. She denies feelings of sadness. There are reports of aspiration present. There are no nursing concerns at this time.   Past Medical History:  Diagnosis Date  . Allergic rhinitis   . CATARACT 06/06/2006   Qualifier: Diagnosis of  By: Genene Churn MD, Janett Billow    . Depressive disorder   . Diabetes mellitus type 2 in obese ([place])   . Essential hypertension, benign 02/24/2015  . Fungal dermatitis   . Hyperlipidemia   . Macular degeneration   . Mild cognitive impairment   . Narcissism ([place])   . Normocytic anemia   . Phobia   . Schizophrenia, paranoid ([place])   . Stress incontinence, female     Past Surgical History:  Procedure Laterality Date  . ABDOMINAL HYSTERECTOMY     due to fibroids  . Avastin Ingection Right 03/26/2017   In Right eye  . CATARACT EXTRACTION  6 16 2006  . CRYOTHERAPY  2 15 2006   facial AK  . hyperplastic   4 20 2006   AK shave biopsy  . intraocular injection  [id number]  . TOTAL ABDOMINAL HYSTERECTOMY W/ BILATERAL SALPINGOOPHORECTOMY     due to endometriosis  non malignant    Social History   Socioeconomic History  . Marital status: Divorced    Spouse name: Not on file  . Number of children: Not on file  . Years of education: Not on file  . Highest education level: Not on file  Social Needs  . Financial resource strain: Not on file  . Food insecurity -  worry: Not on file  . Food insecurity - inability: Not on file  . Transportation needs - medical: Not on file  . Transportation needs - non-medical: Not on file  Occupational History  . Not on file  Tobacco Use  . Smoking status: Never Smoker  . Smokeless tobacco: Never Used  Substance and Sexual Activity  . Alcohol use: No  . Drug use: No  . Sexual activity: Not on file  Other Topics Concern  . Not on file  Social History Narrative  . Not on file   Family History  Problem Relation Age of Onset  . Heart disease Mother   . Heart disease Father   . Stroke Father   . Cancer Cousin        liver and colon  . Stroke Sister       VITAL SIGNS BP 110/60   Pulse 68   Temp (!) 96.1 F (35.6 C)   Resp 18   Ht 4\' 9"  (1.448 m)   Wt 169 lb 3.2 oz (76.7 kg)   SpO2 96%   BMI 36.61 kg/m   Outpatient Encounter Medications as of 06/05/2017  Medication Sig  . acetaminophen (TYLENOL) 325 MG tablet Take 650 mg by mouth every 6 (six) hours as needed for  mild pain, moderate pain or fever.   . Amino Acids-Protein Hydrolys (FEEDING SUPPLEMENT, PRO-STAT SUGAR FREE 64,) LIQD Take 30 mLs by mouth 2 (two) times daily.  Marland Kitchen aspirin EC 325 MG tablet Take 1 tablet (325 mg total) by mouth daily.  Marland Kitchen atorvastatin (LIPITOR) 20 MG tablet Take 1 tablet (20 mg total) by mouth at bedtime.  . benztropine (COGENTIN) 1 MG tablet Take 1 mg by mouth at bedtime.  . Calcium Carbonate-Vitamin D (CALCIUM 600-D) 600-400 MG-UNIT per tablet Take 1 tablet by mouth at bedtime.   . cholecalciferol (VITAMIN D) 1000 UNITS tablet Take 1,000 Units by mouth at bedtime.   . cyanocobalamin 1000 MCG tablet Take 1,000 mcg by mouth daily with breakfast.   . docusate sodium (COLACE) 100 MG capsule Take 100 mg by mouth daily.  Marland Kitchen donepezil (ARICEPT) 10 MG tablet Take 10 mg by mouth at bedtime.  Marland Kitchen escitalopram (LEXAPRO) 20 MG tablet Take 20 mg by mouth daily with breakfast.   . Hypromellose 0.4 % SOLN Place 1 drop into both eyes 3  (three) times daily.  . insulin aspart (NOVOLOG) 100 UNIT/ML injection Inject 8 Units into the skin 3 (three) times daily after meals. Notify MD if BS is less than 70 or greater than 450. Hold Insulin if BS is less than 70  . Insulin Detemir (LEVEMIR FLEXPEN) 100 UNIT/ML Pen Inject 14 Units into the skin at bedtime.   Marland Kitchen ipratropium (ATROVENT) 0.03 % nasal spray Place 2 sprays into both nostrils at bedtime. And at bedtime   . ipratropium (ATROVENT) 0.03 % nasal spray Place 2 sprays into both nostrils 3 (three) times daily before meals.  Marland Kitchen ipratropium-albuterol (DUONEB) 0.5-2.5 (3) MG/3ML SOLN Take 3 mLs by nebulization every 6 (six) hours as needed (for shortness of breath).   . loratadine (CLARITIN) 10 MG tablet Take 10 mg by mouth daily with breakfast.   . losartan (COZAAR) 50 MG tablet Take 50 mg by mouth daily with breakfast.   . Lotion Base LOTN Apply 1 application topically 2 (two) times daily.  . Memantine HCl ER (NAMENDA XR) 28 MG CP24 Take 28 mg by mouth daily with breakfast. For dementia  . Multiple Vitamins-Minerals (DECUBI-VITE) CAPS Take 1 capsule by mouth daily with breakfast.   . Nutritional Supplements (NUTRITIONAL SUPPLEMENT PO) Nectar thick liquids  . OLANZapine (ZYPREXA) 5 MG tablet Take 5 mg by mouth at bedtime.  Marland Kitchen omeprazole (PRILOSEC) 20 MG capsule Take 20 mg by mouth daily at 6 (six) AM.   No facility-administered encounter medications on file as of 06/05/2017.      SIGNIFICANT DIAGNOSTIC EXAMS  PREVIOUS  12-30-14: right eye intraocular injection     03-12-16: EEG: This is a mildly abnormal EEG due to the presence of  moderate generalized slowing which is indicative of bihemispheric dysfunction which is a nonspecific finding seen in a variety of degenerative, hypoxic, ischemic, toxic, metabolic etiologies. No definite epileptiform activity is noted  12-03-16: chest x-ray: no acute cardiopulmonary disease  01-10-17: chest x-ray: no acute cardiopulmonary  disease  04-02-17: ct of head:  1. No acute intracranial pathology seen on CT. 2. Mild to moderate cortical volume loss and scattered small vessel ischemic microangiopathy. 3. Partial opacification of the mastoid air cells bilaterally.  04-02-17: ct angio of head and neck: Minor extracranial and intracranial atheromatous change, not unexpected for age, and stable from 2017. No large vessel occlusion, dissection, or other significant vascular finding. Atrophy and small vessel disease without acute intracranial process.  04-02-17: MRI of brain: Atrophy and small vessel disease. No acute intracranial findings.  NO NEW EXAMS      LABS REVIEWED: PREVIOUS   07-02-16: wbc 8.8; hgb 12.5; hct 37.9 ;mcv 85.6; plt 219; glucose 83; bun 22.5; creat 1.12; k+ 4.8; na++ 140; ca 0.1; liver  Normal albumin 3.4  hgb a1c 7.4 chol 148; ldl 75; trig 165; hdl 40  07-25-16: wbc 7.8; hgb 13.0; hct 39.9; mcv 84.9;  glucose 203; bun 15.5; creat 0.73; k+ 4.4 na++ 138; ca 9.1; hgb a1c 7.2  07-26-16: chol 140; ldl 92; trig 142; hdl 43   11-02-16: glucose 198; bun 17.0; creat 0.89; k+ 4.7; na++ 137; liver normal albumin 4.0 hgb a1c 6.9; chol 162; ldl 85; trig 157; hdl 45 03-29-17: chol 162; ldl 75 trig 214; hdl 44; hgb a1c 9.4 04-02-17: wbc 8.5; hgb 14.2; hct 44.1; mcv 86.0; plt 213; glucose 175; bun 20; creat 1.10; k+ 4.2; na++ 138; ca 9.0; liver normal albumin 3.1; ammonia 14 04-04-17: wbc 8.7; hgb 11.1; hct 34.0; mcv 85.4; plt 188; glucose 97; bun 16; creat 0.87; k+ 3.7; na++137; ca 8.1  TODAY:   05-10-17: wbc 7.3; hgb 12.7; hct 39.7; mcv 85.3; plt 189; glucose 210; bun 24.2; creat 0.92; k+ 4.4; na++ 140; ca 9.2    Review of Systems  Constitutional: Negative for malaise/fatigue.  Respiratory: Negative for cough and shortness of breath.   Cardiovascular: Negative for chest pain, palpitations and leg swelling.  Gastrointestinal: Negative for abdominal pain, constipation and heartburn.  Musculoskeletal: Negative for  back pain, joint pain and myalgias.  Skin: Negative.   Neurological: Negative for dizziness.  Psychiatric/Behavioral: The patient is not nervous/anxious.     Physical Exam  Constitutional: She appears well-developed and well-nourished. No distress.  Neck: No thyromegaly present.  Cardiovascular: Normal rate, regular rhythm and intact distal pulses.  Murmur heard. 1/6  Pulmonary/Chest: Effort normal and breath sounds normal. No respiratory distress.  Abdominal: Soft. Bowel sounds are normal. She exhibits no distension. There is no tenderness.  Musculoskeletal: Normal range of motion. She exhibits no edema.  Lymphadenopathy:    She has no cervical adenopathy.  Neurological: She is alert.  Skin: Skin is warm and dry. She is not diaphoretic.  Psychiatric: She has a normal mood and affect.   .   ASSESSMENT/ PLAN:  TODAY:   1. Dysphagia, oropharyngeal phase:stable she is on nectar thick liquids; no signs of aspiration present   2. Gerd without esophagitis: stable will continue prilosec 20  mg daily   3. Chronic constipation stable : will continue colace daily    4. Depressive dissorder: stable  will continue lexapro 20 mg daily will monitor   PREVIOUS    5. Schizophrenia: is stable continue  zyprexa 5  mg nightly and will monitor   Is followed by psych services.  Will continue cogentin 1 mg nightly for tardive dyskinesia and will monitor   6. Post nasal drip syndrome: is stable;   will continue atrovent nasal spray four times daily   7. Chronic allergic bronchitis: will continue claritin 10 mg daily  8. TIA: is presently stable; will continue asa 325 mg daily   9. Essential hypertension benign: b/p 110/60 is stable : will continue cozaar 50 mg daily   10. Dementia without behavior disturbance; is without change will continue aricept 10 mg daily and namenda xr 28 mg daily will not make changes will monitor   Her current weight is 169  pounds and is currently  stable.   11.  Tardive dyskinesia: stable  she does not have a resting tremor or lip tremor . Will continue cogentin 1 mg nightly   12. Hyperlipidemia associated with type 2 diabetes mellitus: stable  ldl 75; trig 214  will continue lipitor 20 mg daily   13. Controlled diabetes type II without complication with long term insulin use:  stable  hgb a1c 6.9 (previous 7.2)  Will continue levemir 14  units;  novolog  8 units with meals   Is on  statin and arb    MD is aware of resident's narcotic use and is in agreement with current plan of care. We will attempt to wean resident as apropriate   Ok Edwards NP [facility]  Contact [phone] Monday through Friday 8am- 5pm  After hours call [phone]

## 2017-06-21 ENCOUNTER — Encounter (INDEPENDENT_AMBULATORY_CARE_PROVIDER_SITE_OTHER): Payer: Medicare Other | Admitting: Ophthalmology

## 2017-06-21 DIAGNOSIS — H43813 Vitreous degeneration, bilateral: Secondary | ICD-10-CM | POA: Diagnosis not present

## 2017-06-21 DIAGNOSIS — H35033 Hypertensive retinopathy, bilateral: Secondary | ICD-10-CM

## 2017-06-21 DIAGNOSIS — H353231 Exudative age-related macular degeneration, bilateral, with active choroidal neovascularization: Secondary | ICD-10-CM | POA: Diagnosis not present

## 2017-06-21 DIAGNOSIS — I10 Essential (primary) hypertension: Secondary | ICD-10-CM | POA: Diagnosis not present

## 2017-07-02 ENCOUNTER — Encounter: Payer: Self-pay | Admitting: Adult Health

## 2017-07-02 ENCOUNTER — Non-Acute Institutional Stay (SKILLED_NURSING_FACILITY): Payer: Medicare Other | Admitting: Adult Health

## 2017-07-02 DIAGNOSIS — I10 Essential (primary) hypertension: Secondary | ICD-10-CM

## 2017-07-02 DIAGNOSIS — R0982 Postnasal drip: Secondary | ICD-10-CM | POA: Diagnosis not present

## 2017-07-02 DIAGNOSIS — G459 Transient cerebral ischemic attack, unspecified: Secondary | ICD-10-CM | POA: Diagnosis not present

## 2017-07-02 DIAGNOSIS — J45998 Other asthma: Secondary | ICD-10-CM

## 2017-07-02 DIAGNOSIS — F209 Schizophrenia, unspecified: Secondary | ICD-10-CM | POA: Diagnosis not present

## 2017-07-02 NOTE — Progress Notes (Signed)
Location:   [facility] Room Number: 225 B Place of Service:  SNF (31)   CODE STATUS: Full Code (Most Form updated 05/02/17)  Allergies  Allergen Reactions  . Ace Inhibitors     Unknown reaction per MAR   . Ether Nausea And Vomiting    Chief Complaint  Patient presents with  . Medical Management of Chronic Issues    Hypertension; tia; chronic allergic bronchitis; post nasal drip; schizophrenia     HPI:  She is an 84 year old long term resident of this facility being seen for the management of her chronic illnesses: hypertension; tia; chronic allergic bronchitis; post nasal drip; schizophrenia. She has no complaints of cough; shortness of breath; no changes in appetite; no changes in mood state. There are no nursing concerns at this time.   Past Medical History:  Diagnosis Date  . Allergic rhinitis   . CATARACT 06/06/2006   Qualifier: Diagnosis of  By: Genene Churn MD, Janett Billow    . Depressive disorder   . Diabetes mellitus type 2 in obese ([place])   . Essential hypertension, benign 02/24/2015  . Fungal dermatitis   . Hyperlipidemia   . Macular degeneration   . Mild cognitive impairment   . Narcissism ([place])   . Normocytic anemia   . Phobia   . Schizophrenia, paranoid ([place])   . Stress incontinence, female     Past Surgical History:  Procedure Laterality Date  . ABDOMINAL HYSTERECTOMY     due to fibroids  . Avastin Ingection Right 03/26/2017   In Right eye  . CATARACT EXTRACTION  6 16 2006  . CRYOTHERAPY  2 15 2006   facial AK  . hyperplastic   4 20 2006   AK shave biopsy  . intraocular injection  [id number]  . TOTAL ABDOMINAL HYSTERECTOMY W/ BILATERAL SALPINGOOPHORECTOMY     due to endometriosis  non malignant    Social History   Socioeconomic History  . Marital status: Divorced    Spouse name: Not on file  . Number of children: Not on file  . Years of education: Not on file  . Highest education level: Not on file  Occupational History  . Not on  file  Social Needs  . Financial resource strain: Not on file  . Food insecurity:    Worry: Not on file    Inability: Not on file  . Transportation needs:    Medical: Not on file    Non-medical: Not on file  Tobacco Use  . Smoking status: Never Smoker  . Smokeless tobacco: Never Used  Substance and Sexual Activity  . Alcohol use: No  . Drug use: No  . Sexual activity: Not on file  Lifestyle  . Physical activity:    Days per week: Not on file    Minutes per session: Not on file  . Stress: Not on file  Relationships  . Social connections:    Talks on phone: Not on file    Gets together: Not on file    Attends religious service: Not on file    Active member of club or organization: Not on file    Attends meetings of clubs or organizations: Not on file    Relationship status: Not on file  . Intimate partner violence:    Fear of current or ex partner: Not on file    Emotionally abused: Not on file    Physically abused: Not on file    Forced sexual activity: Not on file  Other Topics Concern  . Not on file  Social History Narrative  . Not on file   Family History  Problem Relation Age of Onset  . Heart disease Mother   . Heart disease Father   . Stroke Father   . Cancer Cousin        liver and colon  . Stroke Sister       VITAL SIGNS BP (!) 165/65   Pulse (!) 59   Temp 98.6 F (37 C)   Resp 18   Ht 4\' 9"  (1.448 m)   Wt 164 lb 4.8 oz (74.5 kg)   SpO2 97%   BMI 35.55 kg/m   Outpatient Encounter Medications as of 07/02/2017  Medication Sig  . acetaminophen (TYLENOL) 325 MG tablet Take 650 mg by mouth every 6 (six) hours as needed for mild pain, moderate pain or fever.   Marland Kitchen aspirin EC 325 MG tablet Take 1 tablet (325 mg total) by mouth daily.  Marland Kitchen atorvastatin (LIPITOR) 20 MG tablet Take 1 tablet (20 mg total) by mouth at bedtime.  . benztropine (COGENTIN) 1 MG tablet Take 1 mg by mouth at bedtime.  . Calcium Carbonate-Vitamin D (CALCIUM 600-D) 600-400 MG-UNIT  per tablet Take 1 tablet by mouth at bedtime.   . cholecalciferol (VITAMIN D) 1000 UNITS tablet Take 1,000 Units by mouth at bedtime.   . cyanocobalamin 1000 MCG tablet Take 1,000 mcg by mouth daily with breakfast.   . docusate sodium (COLACE) 100 MG capsule Take 100 mg by mouth daily.  Marland Kitchen donepezil (ARICEPT) 10 MG tablet Take 10 mg by mouth at bedtime.  Marland Kitchen escitalopram (LEXAPRO) 20 MG tablet Take 20 mg by mouth daily with breakfast.   . Hypromellose 0.4 % SOLN Place 1 drop into both eyes 3 (three) times daily.  . insulin aspart (NOVOLOG) 100 UNIT/ML injection Inject 8 Units into the skin 3 (three) times daily after meals. Notify MD if BS is less than 70 or greater than 450. Hold Insulin if BS is less than 70  . Insulin Detemir (LEVEMIR FLEXPEN) 100 UNIT/ML Pen Inject 14 Units into the skin at bedtime.   Marland Kitchen ipratropium (ATROVENT) 0.03 % nasal spray Place 2 sprays into both nostrils at bedtime. And at bedtime   . ipratropium (ATROVENT) 0.03 % nasal spray Place 2 sprays into both nostrils 3 (three) times daily before meals.  Marland Kitchen ipratropium-albuterol (DUONEB) 0.5-2.5 (3) MG/3ML SOLN Take 3 mLs by nebulization every 6 (six) hours as needed (for shortness of breath).   . lip balm (CARMEX) ointment 1 application. Apply to lips 4 times daily for cold sore  . loratadine (CLARITIN) 10 MG tablet Take 10 mg by mouth daily with breakfast.   . losartan (COZAAR) 50 MG tablet Take 50 mg by mouth daily with breakfast.   . Lotion Base LOTN Apply 1 application topically 2 (two) times daily.  . Memantine HCl ER (NAMENDA XR) 28 MG CP24 Take 28 mg by mouth daily with breakfast. For dementia  . Multiple Vitamins-Minerals (DECUBI-VITE) CAPS Take 1 capsule by mouth daily with breakfast.   . Nutritional Supplements (NUTRITIONAL SUPPLEMENT PO) Nectar thick liquids  . Nutritional Supplements (PROMOD PO) Give 22ml by mouth two times daily  . OLANZapine (ZYPREXA) 5 MG tablet Take 5 mg by mouth at bedtime.  Marland Kitchen omeprazole  (PRILOSEC) 20 MG capsule Take 20 mg by mouth daily at 6 (six) AM.  . white petrolatum (VASELINE) GEL Apply to left forehead every shift  . [DISCONTINUED] Amino Acids-Protein  Hydrolys (FEEDING SUPPLEMENT, PRO-STAT SUGAR FREE 64,) LIQD Take 30 mLs by mouth 2 (two) times daily.   No facility-administered encounter medications on file as of 07/02/2017.      SIGNIFICANT DIAGNOSTIC EXAMS  PREVIOUS  12-30-14: right eye intraocular injection     03-12-16: EEG: This is a mildly abnormal EEG due to the presence of  moderate generalized slowing which is indicative of bihemispheric dysfunction which is a nonspecific finding seen in a variety of degenerative, hypoxic, ischemic, toxic, metabolic etiologies. No definite epileptiform activity is noted  12-03-16: chest x-ray: no acute cardiopulmonary disease  01-10-17: chest x-ray: no acute cardiopulmonary disease  04-02-17: ct of head:  1. No acute intracranial pathology seen on CT. 2. Mild to moderate cortical volume loss and scattered small vessel ischemic microangiopathy. 3. Partial opacification of the mastoid air cells bilaterally.  04-02-17: ct angio of head and neck: Minor extracranial and intracranial atheromatous change, not unexpected for age, and stable from 2017. No large vessel occlusion, dissection, or other significant vascular finding. Atrophy and small vessel disease without acute intracranial process.  04-02-17: MRI of brain: Atrophy and small vessel disease. No acute intracranial findings.  NO NEW EXAMS      LABS REVIEWED: PREVIOUS   07-02-16: wbc 8.8; hgb 12.5; hct 37.9 ;mcv 85.6; plt 219; glucose 83; bun 22.5; creat 1.12; k+ 4.8; na++ 140; ca 0.1; liver  Normal albumin 3.4  hgb a1c 7.4 chol 148; ldl 75; trig 165; hdl 40  07-25-16: wbc 7.8; hgb 13.0; hct 39.9; mcv 84.9;  glucose 203; bun 15.5; creat 0.73; k+ 4.4 na++ 138; ca 9.1; hgb a1c 7.2  07-26-16: chol 140; ldl 92; trig 142; hdl 43   11-02-16: glucose 198; bun 17.0; creat  0.89; k+ 4.7; na++ 137; liver normal albumin 4.0 hgb a1c 6.9; chol 162; ldl 85; trig 157; hdl 45 03-29-17: chol 162; ldl 75 trig 214; hdl 44; hgb a1c 9.4 04-02-17: wbc 8.5; hgb 14.2; hct 44.1; mcv 86.0; plt 213; glucose 175; bun 20; creat 1.10; k+ 4.2; na++ 138; ca 9.0; liver normal albumin 3.1; ammonia 14 04-04-17: wbc 8.7; hgb 11.1; hct 34.0; mcv 85.4; plt 188; glucose 97; bun 16; creat 0.87; k+ 3.7; na++137; ca 8.1 05-10-17: wbc 7.3; hgb 12.7; hct 39.7; mcv 85.3; plt 189; glucose 210; bun 24.2; creat 0.92; k+ 4.4; na++ 140; ca 9.2   NO NEW LABS    Review of Systems  Constitutional: Negative for malaise/fatigue.  Respiratory: Negative for cough and shortness of breath.   Cardiovascular: Negative for chest pain, palpitations and leg swelling.  Gastrointestinal: Negative for abdominal pain, constipation and heartburn.  Musculoskeletal: Negative for back pain, joint pain and myalgias.  Skin: Negative.   Neurological: Negative for dizziness.  Psychiatric/Behavioral: The patient is not nervous/anxious.     Physical Exam  Constitutional: She appears well-developed and well-nourished. No distress.  Neck: No thyromegaly present.  Cardiovascular: Normal rate, regular rhythm and intact distal pulses.  Murmur heard. 1/6  Pulmonary/Chest: Effort normal and breath sounds normal. No respiratory distress.  Abdominal: Soft. Bowel sounds are normal. She exhibits no distension. There is no tenderness.  Musculoskeletal: Normal range of motion. She exhibits no edema.  Lymphadenopathy:    She has no cervical adenopathy.  Neurological: She is alert.  No lip tremor present   Skin: Skin is warm and dry. She is not diaphoretic.  Psychiatric: She has a normal mood and affect.   .   ASSESSMENT/ PLAN:  TODAY:   1. Schizophrenia:  is stable continue  zyprexa 5  mg nightly and will monitor   Is followed by psych services.  Will continue cogentin 1 mg nightly for tardive dyskinesia and will monitor   2.  Post nasal drip syndrome: is stable;   will continue atrovent nasal spray four times daily   3. Chronic allergic bronchitis: will continue claritin 10 mg daily  4. TIA: is presently stable; will continue asa 325 mg daily   5. Essential hypertension benign: b/p 165/65  is stable : will continue cozaar 50 mg daily   PREVIOUS    6. Dementia without behavior disturbance; is without change will continue aricept 10 mg daily and namenda xr 28 mg daily will not make changes will monitor   Her current weight is 169  pounds and is currently stable.   7. Tardive dyskinesia: stable  she does not have a resting tremor or lip tremor . Will continue cogentin 1 mg nightly   8. Hyperlipidemia associated with type 2 diabetes mellitus: stable  ldl 75; trig 214  will continue lipitor 20 mg daily   9. Controlled diabetes type II without complication with long term insulin use:  stable  hgb a1c 6.9 (previous 7.2)  Will continue levemir 14  units;  novolog  8 units with meals   Is on  statin and arb  10. Dysphagia, oropharyngeal phase:stable she is on nectar thick liquids; no signs of aspiration present   11. Gerd without esophagitis: stable will continue prilosec 20  mg daily   12. Chronic constipation stable : will continue colace daily    13. Depressive dissorder: stable  will continue lexapro 20 mg daily will monitor      MD is aware of resident's narcotic use and is in agreement with current plan of care. We will attempt to wean resident as apropriate   Ok Edwards NP [facility]  Contact [phone] Monday through Friday 8am- 5pm  After hours call [phone]

## 2017-07-07 ENCOUNTER — Encounter: Payer: Self-pay | Admitting: Internal Medicine

## 2017-07-07 NOTE — Progress Notes (Signed)
Patient ID: Debra Tran, female   DOB: 1927/05/25, 84 y.o.   MRN: [id number]  Location:  [facility]   Place of Service:  SNF (31) Provider:  Wendell, Pillow, DO  Patient Care Team: Gildardo Cranker, DO as PCP - General (Internal Medicine) Hayden Pedro, MD as Attending Physician (Ophthalmology) Nyoka Cowden Phylis Bougie, NP as Nurse Practitioner (Nurse Practitioner) [facility] [place] ([place])  Extended Emergency Contact Information Primary Emergency Contact: Robello,[name] Address: [address]          [place], [place] [address] [place] Phone: [phone] Relation: Daughter  Code Status:   Goals of care: Advanced Directive information Advanced Directives 07/02/2017  Does Patient Have a Medical Advance Directive? Yes  Type of Advance Directive Out of facility DNR (pink MOST or yellow form)  Does patient want to make changes to medical advance directive? No - Patient declined  Copy of [place] in Chart? -  Would patient like information on creating a medical advance directive? -  Pre-existing out of facility DNR order (yellow form or pink MOST form) Pink MOST form placed in chart (order not valid for inpatient use)     Chief Complaint  Patient presents with  . Medical Management of Chronic Issues    30 day f/u    HPI:  Pt is a 84 y.o. female seen today for medical management of chronic diseases.  She has no concerns. No CP, SOB, f/c. She is a poor historian due to dementia. Hx obtained from chart.  Tremor - 2/2 TD. Controlled on cogentin 1 mg nightly   Dyslipidemia - stable on lipitor 20 mg daily. LDL 75; TG 214. No myalgias.  DM - controlled. A1c 6.9% ; takes levemir 10  units;  novolog  8 units with meals. No low BS reactions  hx Dysphagia - stable on nectar liquids; no signs of aspiration present   GERD - stable on prilosec 20  mg daily   Chronic Constipation - stable  on colace daily    Depression - mood stable on lexapro 20 mg daily   Schizophrenia - mood stable on zyprexa 5  mg nightly; cogentin 1 mg nightly for tardive dyskinesia; followed by psych services  Chronic allergic rhinitis - stable on claritin 10 mg daily; atrovent nasal spray for post nasal drip  Hx TIA - stable on ASA 325 mg daily   Hypertension - stable on cozaar 50 mg daily. She takes ASA for anticoagulation  Dementia without behavior disturbance - stable on aricept 10 mg daily and namenda xr 28 mg daily. Albumin 3.1. She gets nutritional supplements per facility protocol   Past Medical History:  Diagnosis Date  . Allergic rhinitis   . CATARACT 06/06/2006   Qualifier: Diagnosis of  By: Genene Churn MD, Janett Billow    . Depressive disorder   . Diabetes mellitus type 2 in obese ([place])   . Essential hypertension, benign 02/24/2015  . Fungal dermatitis   . Hyperlipidemia   . Macular degeneration   . Mild cognitive impairment   . Narcissism ([place])   . Normocytic anemia   . Phobia   . Schizophrenia, paranoid ([place])   . Stress incontinence, female    Past Surgical History:  Procedure Laterality Date  . ABDOMINAL HYSTERECTOMY     due to fibroids  . Avastin Ingection Right 03/26/2017   In Right eye  . CATARACT EXTRACTION  6 16 2006  . CRYOTHERAPY  2 15 2006   facial AK  . hyperplastic   4 20 2006   AK shave biopsy  . intraocular injection  [id number]  . TOTAL ABDOMINAL HYSTERECTOMY W/ BILATERAL SALPINGOOPHORECTOMY     due to endometriosis  non malignant    Allergies  Allergen Reactions  . Ace Inhibitors     Unknown reaction per MAR   . Ether Nausea And Vomiting    Outpatient Encounter Medications as of 05/09/2017  Medication Sig  . acetaminophen (TYLENOL) 325 MG tablet Take 650 mg by mouth every 6 (six) hours as needed for mild pain, moderate pain or fever.   Marland Kitchen aspirin EC 325 MG tablet Take 1 tablet (325 mg total) by mouth daily.  Marland Kitchen atorvastatin (LIPITOR) 20 MG tablet Take 1  tablet (20 mg total) by mouth at bedtime.  . benztropine (COGENTIN) 1 MG tablet Take 1 mg by mouth at bedtime.  . Calcium Carbonate-Vitamin D (CALCIUM 600-D) 600-400 MG-UNIT per tablet Take 1 tablet by mouth at bedtime.   . cholecalciferol (VITAMIN D) 1000 UNITS tablet Take 1,000 Units by mouth at bedtime.   . cyanocobalamin 1000 MCG tablet Take 1,000 mcg by mouth daily with breakfast.   . docusate sodium (COLACE) 100 MG capsule Take 100 mg by mouth daily.  Marland Kitchen donepezil (ARICEPT) 10 MG tablet Take 10 mg by mouth at bedtime.  Marland Kitchen escitalopram (LEXAPRO) 20 MG tablet Take 20 mg by mouth daily with breakfast.   . Hypromellose 0.4 % SOLN Place 1 drop into both eyes 3 (three) times daily.  . insulin aspart (NOVOLOG) 100 UNIT/ML injection Inject 8 Units into the skin 3 (three) times daily after meals. Notify MD if BS is less than 70 or greater than 450. Hold Insulin if BS is less than 70  . Insulin Detemir (LEVEMIR FLEXPEN) 100 UNIT/ML Pen Inject 14 Units into the skin at bedtime.   Marland Kitchen ipratropium (ATROVENT) 0.03 % nasal spray Place 2 sprays into both nostrils at bedtime. And at bedtime   . ipratropium (ATROVENT) 0.03 % nasal spray Place 2 sprays into both nostrils 3 (three) times daily before meals.  Marland Kitchen ipratropium-albuterol (DUONEB) 0.5-2.5 (3) MG/3ML SOLN Take 3 mLs by nebulization every 6 (six) hours as needed (for shortness of breath).   . loratadine (CLARITIN) 10 MG tablet Take 10 mg by mouth daily with breakfast.   . losartan (COZAAR) 50 MG tablet Take 50 mg by mouth daily with breakfast.   . Lotion Base LOTN Apply 1 application topically 2 (two) times daily.  . Memantine HCl ER (NAMENDA XR) 28 MG CP24 Take 28 mg by mouth daily with breakfast. For dementia  . Multiple Vitamins-Minerals (DECUBI-VITE) CAPS Take 1 capsule by mouth daily with breakfast.   . Nutritional Supplements (NUTRITIONAL SUPPLEMENT PO) Nectar thick liquids  . OLANZapine (ZYPREXA) 5 MG tablet Take 5 mg by mouth at bedtime.  Marland Kitchen  omeprazole (PRILOSEC) 20 MG capsule Take 20 mg by mouth daily at 6 (six) AM.  . [DISCONTINUED] Amino Acids-Protein Hydrolys (FEEDING SUPPLEMENT, PRO-STAT SUGAR FREE 64,) LIQD Take 30 mLs by mouth 2 (two) times daily.   No facility-administered encounter medications on file as of 05/09/2017.     Review of Systems  Unable to perform ROS: Psychiatric disorder    Immunization History  Administered Date(s) Administered  . Influenza Split 02/15/2011, 01/21/2012  . Influenza Whole 02/05/2007, 01/17/2010, 01/28/2013  . Influenza-Unspecified 06/04/2014, 05/17/2015, 01/15/2016, 02/01/2017  . PPD Test 08/24/2015, 08/14/2016  . Pneumococcal Polysaccharide-23 04/10/1999  . Td  10/07/2001   Pertinent  Health Maintenance Due  Topic Date Due  . FOOT EXAM  08/28/2017 (Originally 08/10/2016)  . HEMOGLOBIN A1C  09/27/2017  . OPHTHALMOLOGY EXAM  11/28/2017  . INFLUENZA VACCINE  Completed  . DEXA SCAN  Completed  . PNA vac Low Risk Adult  Completed   Fall Risk  10/25/2016 02/27/2016 09/14/2014 05/29/2012  Falls in the past year? No No No -  Risk for fall due to : - - - (No Data)  Risk for fall due to: Comment - - - pt walks w/a walker   Functional Status Survey:    Vitals:   05/09/17 1543  BP: 110/60  Pulse: 68  Temp: (!) 96.1 F (35.6 C)  SpO2: 97%  Weight: 173 lb (78.5 kg)   Body mass index is 37.44 kg/m. Physical Exam  Constitutional: She appears well-developed.  Sitting in w/c in NAD  HENT:  Mouth/Throat: Oropharynx is clear and moist. No oropharyngeal exudate.  MM dry; no oral thrush  Eyes: Pupils are equal, round, and reactive to light. No scleral icterus.  Neck: Neck supple. Carotid bruit is not present. No tracheal deviation present. No thyromegaly present.  Cardiovascular: Normal rate, regular rhythm and intact distal pulses. Exam reveals no gallop and no friction rub.  Murmur heard.  Systolic murmur is present with a grade of 1/6. No LE edema b/l. no calf TTP.     Pulmonary/Chest: Effort normal and breath sounds normal. No stridor. No respiratory distress. She has no wheezes. She has no rales.  Abdominal: Soft. Normal appearance and bowel sounds are normal. She exhibits no distension and no mass. There is no hepatomegaly. There is no tenderness. There is no rigidity, no rebound and no guarding. No hernia.  Musculoskeletal: She exhibits edema.  Lymphadenopathy:    She has no cervical adenopathy.  Neurological: She is alert.  Skin: Skin is warm and dry. No rash noted.  Psychiatric: She has a normal mood and affect. Her behavior is normal.    Labs reviewed: Recent Labs    04/02/17 0140 04/03/17 0418 04/04/17 0401 05/10/17  NA 138 138 137 140  K 4.2 4.3 3.7 4.4  CL 99* 104 105  --   CO2 33* 25 28  --   GLUCOSE 175* 108* 97  --   BUN 20 17 16  24*  CREATININE 1.10* 0.87 0.87 0.9  CALCIUM 9.0 8.4* 8.1*  --    Recent Labs    11/02/16 03/29/17 04/02/17 0244  AST 21 16 25   ALT 19 16 18   ALKPHOS 102 117 87  BILITOT  --   --  0.7  PROT  --   --  6.5  ALBUMIN  --   --  3.1*   Recent Labs    03/29/17 04/02/17 0140 04/04/17 0401 05/10/17  WBC 6.8 8.5 8.7 7.3  NEUTROABS 4 4.9  --  4  HGB 12.7 14.2 11.1* 12.7  HCT 39 44.1 34.0* 40  MCV  --  86.0 85.4  --   PLT 211 213 188 189   Lab Results  Component Value Date   TSH 1.593 08/31/2011   Lab Results  Component Value Date   HGBA1C 9.4 03/29/2017   Lab Results  Component Value Date   CHOL 162 03/29/2017   HDL 44 03/29/2017   LDLCALC 75 03/29/2017   LDLDIRECT 92 08/31/2011   TRIG 214 (A) 03/29/2017   CHOLHDL 3.7 06/30/2010    Significant Diagnostic Results in last 30 days:  No  results found.  Assessment/Plan   ICD-10-CM   1. Vascular dementia without behavioral disturbance F01.50   2. Hyperlipidemia associated with type 2 diabetes mellitus ([place]) E11.69    E78.5   3. Controlled type 2 diabetes mellitus without complication, with long-term current use of insulin ([facility]) E11.9     Z79.4   4. Essential hypertension, benign I10   5. Schizophrenia, unspecified type ([place]) F20.9     Cont current meds as ordered  PT/OT/ST as indicated  Nutritional supplements as indicated  Will follow  Labs/tests ordered: cbc, bmp   Donnika Kucher S. Perlie Gold  [facility] [address] [place], [place] [address] [phone] Cell (Monday-Friday 8 AM - 5 PM) [phone] After 5 PM and follow prompts

## 2017-07-11 DIAGNOSIS — F251 Schizoaffective disorder, depressive type: Secondary | ICD-10-CM | POA: Diagnosis not present

## 2017-07-11 DIAGNOSIS — F0151 Vascular dementia with behavioral disturbance: Secondary | ICD-10-CM | POA: Diagnosis not present

## 2017-07-11 DIAGNOSIS — M150 Primary generalized (osteo)arthritis: Secondary | ICD-10-CM | POA: Diagnosis not present

## 2017-07-11 DIAGNOSIS — E118 Type 2 diabetes mellitus with unspecified complications: Secondary | ICD-10-CM | POA: Diagnosis not present

## 2017-07-11 DIAGNOSIS — M6281 Muscle weakness (generalized): Secondary | ICD-10-CM | POA: Diagnosis not present

## 2017-07-14 DIAGNOSIS — M6281 Muscle weakness (generalized): Secondary | ICD-10-CM | POA: Diagnosis not present

## 2017-07-14 DIAGNOSIS — E118 Type 2 diabetes mellitus with unspecified complications: Secondary | ICD-10-CM | POA: Diagnosis not present

## 2017-07-14 DIAGNOSIS — M150 Primary generalized (osteo)arthritis: Secondary | ICD-10-CM | POA: Diagnosis not present

## 2017-07-15 DIAGNOSIS — M150 Primary generalized (osteo)arthritis: Secondary | ICD-10-CM | POA: Diagnosis not present

## 2017-07-15 DIAGNOSIS — E118 Type 2 diabetes mellitus with unspecified complications: Secondary | ICD-10-CM | POA: Diagnosis not present

## 2017-07-15 DIAGNOSIS — M6281 Muscle weakness (generalized): Secondary | ICD-10-CM | POA: Diagnosis not present

## 2017-07-16 DIAGNOSIS — E118 Type 2 diabetes mellitus with unspecified complications: Secondary | ICD-10-CM | POA: Diagnosis not present

## 2017-07-16 DIAGNOSIS — M150 Primary generalized (osteo)arthritis: Secondary | ICD-10-CM | POA: Diagnosis not present

## 2017-07-16 DIAGNOSIS — M6281 Muscle weakness (generalized): Secondary | ICD-10-CM | POA: Diagnosis not present

## 2017-07-17 DIAGNOSIS — E118 Type 2 diabetes mellitus with unspecified complications: Secondary | ICD-10-CM | POA: Diagnosis not present

## 2017-07-17 DIAGNOSIS — M150 Primary generalized (osteo)arthritis: Secondary | ICD-10-CM | POA: Diagnosis not present

## 2017-07-17 DIAGNOSIS — M6281 Muscle weakness (generalized): Secondary | ICD-10-CM | POA: Diagnosis not present

## 2017-07-18 DIAGNOSIS — M6281 Muscle weakness (generalized): Secondary | ICD-10-CM | POA: Diagnosis not present

## 2017-07-18 DIAGNOSIS — M150 Primary generalized (osteo)arthritis: Secondary | ICD-10-CM | POA: Diagnosis not present

## 2017-07-18 DIAGNOSIS — E118 Type 2 diabetes mellitus with unspecified complications: Secondary | ICD-10-CM | POA: Diagnosis not present

## 2017-07-19 ENCOUNTER — Encounter (INDEPENDENT_AMBULATORY_CARE_PROVIDER_SITE_OTHER): Payer: Medicare Other | Admitting: Ophthalmology

## 2017-07-21 DIAGNOSIS — M6281 Muscle weakness (generalized): Secondary | ICD-10-CM | POA: Diagnosis not present

## 2017-07-21 DIAGNOSIS — E118 Type 2 diabetes mellitus with unspecified complications: Secondary | ICD-10-CM | POA: Diagnosis not present

## 2017-07-21 DIAGNOSIS — M150 Primary generalized (osteo)arthritis: Secondary | ICD-10-CM | POA: Diagnosis not present

## 2017-07-22 DIAGNOSIS — M6281 Muscle weakness (generalized): Secondary | ICD-10-CM | POA: Diagnosis not present

## 2017-07-22 DIAGNOSIS — E118 Type 2 diabetes mellitus with unspecified complications: Secondary | ICD-10-CM | POA: Diagnosis not present

## 2017-07-22 DIAGNOSIS — M150 Primary generalized (osteo)arthritis: Secondary | ICD-10-CM | POA: Diagnosis not present

## 2017-07-23 DIAGNOSIS — E118 Type 2 diabetes mellitus with unspecified complications: Secondary | ICD-10-CM | POA: Diagnosis not present

## 2017-07-23 DIAGNOSIS — M6281 Muscle weakness (generalized): Secondary | ICD-10-CM | POA: Diagnosis not present

## 2017-07-23 DIAGNOSIS — M150 Primary generalized (osteo)arthritis: Secondary | ICD-10-CM | POA: Diagnosis not present

## 2017-07-24 DIAGNOSIS — E118 Type 2 diabetes mellitus with unspecified complications: Secondary | ICD-10-CM | POA: Diagnosis not present

## 2017-07-24 DIAGNOSIS — M150 Primary generalized (osteo)arthritis: Secondary | ICD-10-CM | POA: Diagnosis not present

## 2017-07-24 DIAGNOSIS — M6281 Muscle weakness (generalized): Secondary | ICD-10-CM | POA: Diagnosis not present

## 2017-07-25 DIAGNOSIS — E118 Type 2 diabetes mellitus with unspecified complications: Secondary | ICD-10-CM | POA: Diagnosis not present

## 2017-07-25 DIAGNOSIS — M6281 Muscle weakness (generalized): Secondary | ICD-10-CM | POA: Diagnosis not present

## 2017-07-25 DIAGNOSIS — M150 Primary generalized (osteo)arthritis: Secondary | ICD-10-CM | POA: Diagnosis not present

## 2017-07-28 DIAGNOSIS — M6281 Muscle weakness (generalized): Secondary | ICD-10-CM | POA: Diagnosis not present

## 2017-07-28 DIAGNOSIS — E118 Type 2 diabetes mellitus with unspecified complications: Secondary | ICD-10-CM | POA: Diagnosis not present

## 2017-07-28 DIAGNOSIS — M150 Primary generalized (osteo)arthritis: Secondary | ICD-10-CM | POA: Diagnosis not present

## 2017-07-29 DIAGNOSIS — E118 Type 2 diabetes mellitus with unspecified complications: Secondary | ICD-10-CM | POA: Diagnosis not present

## 2017-07-29 DIAGNOSIS — M6281 Muscle weakness (generalized): Secondary | ICD-10-CM | POA: Diagnosis not present

## 2017-07-29 DIAGNOSIS — M150 Primary generalized (osteo)arthritis: Secondary | ICD-10-CM | POA: Diagnosis not present

## 2017-07-30 DIAGNOSIS — E118 Type 2 diabetes mellitus with unspecified complications: Secondary | ICD-10-CM | POA: Diagnosis not present

## 2017-07-30 DIAGNOSIS — M150 Primary generalized (osteo)arthritis: Secondary | ICD-10-CM | POA: Diagnosis not present

## 2017-07-30 DIAGNOSIS — M6281 Muscle weakness (generalized): Secondary | ICD-10-CM | POA: Diagnosis not present

## 2017-07-31 DIAGNOSIS — M6281 Muscle weakness (generalized): Secondary | ICD-10-CM | POA: Diagnosis not present

## 2017-07-31 DIAGNOSIS — E118 Type 2 diabetes mellitus with unspecified complications: Secondary | ICD-10-CM | POA: Diagnosis not present

## 2017-07-31 DIAGNOSIS — M150 Primary generalized (osteo)arthritis: Secondary | ICD-10-CM | POA: Diagnosis not present

## 2017-08-01 DIAGNOSIS — M6281 Muscle weakness (generalized): Secondary | ICD-10-CM | POA: Diagnosis not present

## 2017-08-01 DIAGNOSIS — M150 Primary generalized (osteo)arthritis: Secondary | ICD-10-CM | POA: Diagnosis not present

## 2017-08-01 DIAGNOSIS — E118 Type 2 diabetes mellitus with unspecified complications: Secondary | ICD-10-CM | POA: Diagnosis not present

## 2017-08-04 DIAGNOSIS — E118 Type 2 diabetes mellitus with unspecified complications: Secondary | ICD-10-CM | POA: Diagnosis not present

## 2017-08-04 DIAGNOSIS — M150 Primary generalized (osteo)arthritis: Secondary | ICD-10-CM | POA: Diagnosis not present

## 2017-08-04 DIAGNOSIS — M6281 Muscle weakness (generalized): Secondary | ICD-10-CM | POA: Diagnosis not present

## 2017-08-05 ENCOUNTER — Non-Acute Institutional Stay (SKILLED_NURSING_FACILITY): Payer: Medicare Other | Admitting: Adult Health

## 2017-08-05 ENCOUNTER — Encounter: Payer: Self-pay | Admitting: Adult Health

## 2017-08-05 DIAGNOSIS — M150 Primary generalized (osteo)arthritis: Secondary | ICD-10-CM | POA: Diagnosis not present

## 2017-08-05 DIAGNOSIS — F251 Schizoaffective disorder, depressive type: Secondary | ICD-10-CM | POA: Diagnosis not present

## 2017-08-05 DIAGNOSIS — F0151 Vascular dementia with behavioral disturbance: Secondary | ICD-10-CM | POA: Diagnosis not present

## 2017-08-05 DIAGNOSIS — F0150 Vascular dementia without behavioral disturbance: Secondary | ICD-10-CM

## 2017-08-05 DIAGNOSIS — E119 Type 2 diabetes mellitus without complications: Secondary | ICD-10-CM

## 2017-08-05 DIAGNOSIS — E785 Hyperlipidemia, unspecified: Secondary | ICD-10-CM

## 2017-08-05 DIAGNOSIS — G2401 Drug induced subacute dyskinesia: Secondary | ICD-10-CM | POA: Diagnosis not present

## 2017-08-05 DIAGNOSIS — E118 Type 2 diabetes mellitus with unspecified complications: Secondary | ICD-10-CM | POA: Diagnosis not present

## 2017-08-05 DIAGNOSIS — E1169 Type 2 diabetes mellitus with other specified complication: Secondary | ICD-10-CM

## 2017-08-05 DIAGNOSIS — M6281 Muscle weakness (generalized): Secondary | ICD-10-CM | POA: Diagnosis not present

## 2017-08-05 DIAGNOSIS — Z794 Long term (current) use of insulin: Secondary | ICD-10-CM | POA: Diagnosis not present

## 2017-08-05 NOTE — Progress Notes (Signed)
Location:   [facility] Room Number: 225 B Place of Service:  SNF (31)   CODE STATUS: Full Code (Most form updated 05/02/17)  Allergies  Allergen Reactions  . Ace Inhibitors     Unknown reaction per MAR   . Ether Nausea And Vomiting    Chief Complaint  Patient presents with  . Medical Management of Chronic Issues    Hyperlipidemia; diabetes; dementia; tardive dyskinesia    HPI:  She is a 84 year old long term resident of this facility being seen for the management of his chronic illnesses; hyperlipidemia; diabetes; dementia; tardive dyskinesia. She denies any chest pain no cough; no change in appetite. There are no nursing concerns at this time.   Past Medical History:  Diagnosis Date  . Allergic rhinitis   . CATARACT 06/06/2006   Qualifier: Diagnosis of  By: Genene Churn MD, Janett Billow    . Depressive disorder   . Diabetes mellitus type 2 in obese ([place])   . Essential hypertension, benign 02/24/2015  . Fungal dermatitis   . Hyperlipidemia   . Macular degeneration   . Mild cognitive impairment   . Narcissism ([place])   . Normocytic anemia   . Phobia   . Schizophrenia, paranoid ([place])   . Stress incontinence, female     Past Surgical History:  Procedure Laterality Date  . ABDOMINAL HYSTERECTOMY     due to fibroids  . Avastin Ingection Right 03/26/2017   In Right eye  . CATARACT EXTRACTION  6 16 2006  . CRYOTHERAPY  2 15 2006   facial AK  . hyperplastic   4 20 2006   AK shave biopsy  . intraocular injection  [id number]  . TOTAL ABDOMINAL HYSTERECTOMY W/ BILATERAL SALPINGOOPHORECTOMY     due to endometriosis  non malignant    Social History   Socioeconomic History  . Marital status: Divorced    Spouse name: Not on file  . Number of children: Not on file  . Years of education: Not on file  . Highest education level: Not on file  Occupational History  . Not on file  Social Needs  . Financial resource strain: Not on file  . Food insecurity:    Worry:  Not on file    Inability: Not on file  . Transportation needs:    Medical: Not on file    Non-medical: Not on file  Tobacco Use  . Smoking status: Never Smoker  . Smokeless tobacco: Never Used  Substance and Sexual Activity  . Alcohol use: No  . Drug use: No  . Sexual activity: Not on file  Lifestyle  . Physical activity:    Days per week: Not on file    Minutes per session: Not on file  . Stress: Not on file  Relationships  . Social connections:    Talks on phone: Not on file    Gets together: Not on file    Attends religious service: Not on file    Active member of club or organization: Not on file    Attends meetings of clubs or organizations: Not on file    Relationship status: Not on file  . Intimate partner violence:    Fear of current or ex partner: Not on file    Emotionally abused: Not on file    Physically abused: Not on file    Forced sexual activity: Not on file  Other Topics Concern  . Not on file  Social History Narrative  . Not  on file   Family History  Problem Relation Age of Onset  . Heart disease Mother   . Heart disease Father   . Stroke Father   . Cancer Cousin        liver and colon  . Stroke Sister       VITAL SIGNS BP 120/66   Pulse 70   Temp 98 F (36.7 C)   Resp 18   Ht 4\' 9"  (1.448 m)   Wt 155 lb 9.6 oz (70.6 kg)   SpO2 96%   BMI 33.67 kg/m   Outpatient Encounter Medications as of 08/05/2017  Medication Sig  . acetaminophen (TYLENOL) 325 MG tablet Take 650 mg by mouth every 6 (six) hours as needed for mild pain, moderate pain or fever.   Marland Kitchen aspirin EC 325 MG tablet Take 1 tablet (325 mg total) by mouth daily.  Marland Kitchen atorvastatin (LIPITOR) 20 MG tablet Take 1 tablet (20 mg total) by mouth at bedtime.  . benztropine (COGENTIN) 1 MG tablet Take 1 mg by mouth at bedtime.  . Calcium Carbonate-Vitamin D (CALCIUM 600-D) 600-400 MG-UNIT per tablet Take 1 tablet by mouth at bedtime.   . cholecalciferol (VITAMIN D) 1000 UNITS tablet Take  1,000 Units by mouth at bedtime.   . cyanocobalamin 1000 MCG tablet Take 1,000 mcg by mouth daily with breakfast.   . docusate sodium (COLACE) 100 MG capsule Take 100 mg by mouth daily.  Marland Kitchen donepezil (ARICEPT) 10 MG tablet Take 10 mg by mouth at bedtime.  Marland Kitchen escitalopram (LEXAPRO) 20 MG tablet Take 20 mg by mouth daily with breakfast.   . Hypromellose 0.4 % SOLN Place 1 drop into both eyes 3 (three) times daily.  . insulin aspart (NOVOLOG) 100 UNIT/ML injection Inject 8 Units into the skin 3 (three) times daily after meals. Notify MD if BS is less than 70 or greater than 450. Hold Insulin if BS is less than 70  . Insulin Detemir (LEVEMIR FLEXPEN) 100 UNIT/ML Pen Inject 14 Units into the skin at bedtime.   Marland Kitchen ipratropium (ATROVENT) 0.03 % nasal spray Place 2 sprays into both nostrils at bedtime. And at bedtime   . ipratropium (ATROVENT) 0.03 % nasal spray Place 2 sprays into both nostrils 3 (three) times daily before meals.  Marland Kitchen ipratropium-albuterol (DUONEB) 0.5-2.5 (3) MG/3ML SOLN Take 3 mLs by nebulization every 6 (six) hours as needed (for shortness of breath).   . lip balm (CARMEX) ointment 1 application. Apply to lips 4 times daily for cold sore  . loratadine (CLARITIN) 10 MG tablet Take 10 mg by mouth daily with breakfast.   . losartan (COZAAR) 50 MG tablet Take 50 mg by mouth daily with breakfast.   . Lotion Base LOTN Apply 1 application topically 2 (two) times daily.  . Memantine HCl ER (NAMENDA XR) 28 MG CP24 Take 28 mg by mouth daily with breakfast. For dementia  . Multiple Vitamin (MULTIVITAMIN) tablet Take 1 tablet by mouth daily.  . Nutritional Supplements (NUTRITIONAL SUPPLEMENT PO) Nectar thick liquids  . Nutritional Supplements (PROMOD PO) Give 29ml by mouth two times daily  . OLANZapine (ZYPREXA) 5 MG tablet Take 5 mg by mouth at bedtime.  Marland Kitchen omeprazole (PRILOSEC) 20 MG capsule Take 20 mg by mouth daily at 6 (six) AM.  . white petrolatum (VASELINE) GEL Apply to left forehead every  shift  . [DISCONTINUED] Multiple Vitamins-Minerals (DECUBI-VITE) CAPS Take 1 capsule by mouth daily with breakfast.    No facility-administered encounter medications on file as  of 08/05/2017.      SIGNIFICANT DIAGNOSTIC EXAMS  PREVIOUS  12-30-14: right eye intraocular injection     03-12-16: EEG: This is a mildly abnormal EEG due to the presence of  moderate generalized slowing which is indicative of bihemispheric dysfunction which is a nonspecific finding seen in a variety of degenerative, hypoxic, ischemic, toxic, metabolic etiologies. No definite epileptiform activity is noted  12-03-16: chest x-ray: no acute cardiopulmonary disease  01-10-17: chest x-ray: no acute cardiopulmonary disease  04-02-17: ct of head:  1. No acute intracranial pathology seen on CT. 2. Mild to moderate cortical volume loss and scattered small vessel ischemic microangiopathy. 3. Partial opacification of the mastoid air cells bilaterally.  04-02-17: ct angio of head and neck: Minor extracranial and intracranial atheromatous change, not unexpected for age, and stable from 2017. No large vessel occlusion, dissection, or other significant vascular finding. Atrophy and small vessel disease without acute intracranial process.  04-02-17: MRI of brain: Atrophy and small vessel disease. No acute intracranial findings.  NO NEW EXAMS      LABS REVIEWED: PREVIOUS   07-25-16: wbc 7.8; hgb 13.0; hct 39.9; mcv 84.9;  glucose 203; bun 15.5; creat 0.73; k+ 4.4 na++ 138; ca 9.1; hgb a1c 7.2  07-26-16: chol 140; ldl 92; trig 142; hdl 43   11-02-16: glucose 198; bun 17.0; creat 0.89; k+ 4.7; na++ 137; liver normal albumin 4.0 hgb a1c 6.9; chol 162; ldl 85; trig 157; hdl 45 03-29-17: chol 162; ldl 75 trig 214; hdl 44; hgb a1c 9.4 04-02-17: wbc 8.5; hgb 14.2; hct 44.1; mcv 86.0; plt 213; glucose 175; bun 20; creat 1.10; k+ 4.2; na++ 138; ca 9.0; liver normal albumin 3.1; ammonia 14 04-04-17: wbc 8.7; hgb 11.1; hct 34.0; mcv  85.4; plt 188; glucose 97; bun 16; creat 0.87; k+ 3.7; na++137; ca 8.1 05-10-17: wbc 7.3; hgb 12.7; hct 39.7; mcv 85.3; plt 189; glucose 210; bun 24.2; creat 0.92; k+ 4.4; na++ 140; ca 9.2   NO NEW LABS    Review of Systems  Constitutional: Negative for malaise/fatigue.  Respiratory: Negative for cough and shortness of breath.   Cardiovascular: Negative for chest pain, palpitations and leg swelling.  Gastrointestinal: Negative for abdominal pain, constipation and heartburn.  Musculoskeletal: Negative for back pain, joint pain and myalgias.  Skin: Negative.   Neurological: Negative for dizziness.  Psychiatric/Behavioral: The patient is not nervous/anxious.     Physical Exam  Constitutional: She appears well-developed and well-nourished. No distress.  Neck: No thyromegaly present.  Cardiovascular: Normal rate, regular rhythm and intact distal pulses.  Murmur heard. 1/6  Pulmonary/Chest: Effort normal and breath sounds normal.  Abdominal: Soft. Bowel sounds are normal. She exhibits no distension. There is no tenderness.  Musculoskeletal: Normal range of motion. She exhibits no edema.  Lymphadenopathy:    She has no cervical adenopathy.  Neurological: She is alert.  Skin: Skin is warm and dry. She is not diaphoretic.     ASSESSMENT/ PLAN:  TODAY:   1. Dementia without behavior disturbance; is without change will continue aricept 10 mg daily and namenda xr 28 mg daily will not make changes will monitor   Her current weight is 155 pounds and is currently stable.   2. Tardive dyskinesia: stable  she does not have a resting tremor or lip tremor . Will continue cogentin 1 mg nightly   3. Hyperlipidemia associated with type 2 diabetes mellitus: stable  ldl 75; trig 214  will continue lipitor 20 mg daily   4.  Controlled diabetes type II without complication with long term insulin use:  stable  hgb a1c 6.9 (previous 7.2)  Will continue levemir 14  units;  novolog  8 units with meals   Is  on  statin and arb   PREVIOUS    5. Dysphagia, oropharyngeal phase:stable she is on nectar thick liquids; no signs of aspiration present   6. Gerd without esophagitis: stable will continue prilosec 20  mg daily   7. Chronic constipation stable : will continue colace daily    8. Depressive dissorder: stable  will continue lexapro 20 mg daily will monitor   9. Schizophrenia: is stable continue  zyprexa 5  mg nightly and will monitor   Is followed by psych services.  Will continue cogentin 1 mg nightly for tardive dyskinesia and will monitor   10. Post nasal drip syndrome: is stable;   will continue atrovent nasal spray four times daily   11. Chronic allergic bronchitis: will continue claritin 10 mg daily  12. TIA: is presently stable; will continue asa 325 mg daily   13. Essential hypertension benign: b/p 120/66  is stable : will continue cozaar 50 mg daily      MD is aware of resident's narcotic use and is in agreement with current plan of care. We will attempt to wean resident as apropriate   Ok Edwards NP [facility]  Contact [phone] Monday through Friday 8am- 5pm  After hours call [phone]

## 2017-08-06 DIAGNOSIS — E118 Type 2 diabetes mellitus with unspecified complications: Secondary | ICD-10-CM | POA: Diagnosis not present

## 2017-08-06 DIAGNOSIS — M6281 Muscle weakness (generalized): Secondary | ICD-10-CM | POA: Diagnosis not present

## 2017-08-06 DIAGNOSIS — M150 Primary generalized (osteo)arthritis: Secondary | ICD-10-CM | POA: Diagnosis not present

## 2017-08-07 DIAGNOSIS — R41841 Cognitive communication deficit: Secondary | ICD-10-CM | POA: Diagnosis not present

## 2017-08-07 DIAGNOSIS — M6281 Muscle weakness (generalized): Secondary | ICD-10-CM | POA: Diagnosis not present

## 2017-08-07 DIAGNOSIS — M150 Primary generalized (osteo)arthritis: Secondary | ICD-10-CM | POA: Diagnosis not present

## 2017-08-07 DIAGNOSIS — E118 Type 2 diabetes mellitus with unspecified complications: Secondary | ICD-10-CM | POA: Diagnosis not present

## 2017-08-07 DIAGNOSIS — F0391 Unspecified dementia with behavioral disturbance: Secondary | ICD-10-CM | POA: Diagnosis not present

## 2017-08-08 DIAGNOSIS — M150 Primary generalized (osteo)arthritis: Secondary | ICD-10-CM | POA: Diagnosis not present

## 2017-08-08 DIAGNOSIS — F0391 Unspecified dementia with behavioral disturbance: Secondary | ICD-10-CM | POA: Diagnosis not present

## 2017-08-08 DIAGNOSIS — M6281 Muscle weakness (generalized): Secondary | ICD-10-CM | POA: Diagnosis not present

## 2017-08-08 DIAGNOSIS — R41841 Cognitive communication deficit: Secondary | ICD-10-CM | POA: Diagnosis not present

## 2017-08-08 DIAGNOSIS — E118 Type 2 diabetes mellitus with unspecified complications: Secondary | ICD-10-CM | POA: Diagnosis not present

## 2017-08-09 ENCOUNTER — Encounter (INDEPENDENT_AMBULATORY_CARE_PROVIDER_SITE_OTHER): Payer: Medicare Other | Admitting: Ophthalmology

## 2017-08-09 DIAGNOSIS — H353231 Exudative age-related macular degeneration, bilateral, with active choroidal neovascularization: Secondary | ICD-10-CM | POA: Diagnosis not present

## 2017-08-09 DIAGNOSIS — I10 Essential (primary) hypertension: Secondary | ICD-10-CM

## 2017-08-09 DIAGNOSIS — H35033 Hypertensive retinopathy, bilateral: Secondary | ICD-10-CM | POA: Diagnosis not present

## 2017-08-09 DIAGNOSIS — H43813 Vitreous degeneration, bilateral: Secondary | ICD-10-CM

## 2017-08-12 DIAGNOSIS — F0391 Unspecified dementia with behavioral disturbance: Secondary | ICD-10-CM | POA: Diagnosis not present

## 2017-08-12 DIAGNOSIS — M6281 Muscle weakness (generalized): Secondary | ICD-10-CM | POA: Diagnosis not present

## 2017-08-12 DIAGNOSIS — R41841 Cognitive communication deficit: Secondary | ICD-10-CM | POA: Diagnosis not present

## 2017-08-12 DIAGNOSIS — E118 Type 2 diabetes mellitus with unspecified complications: Secondary | ICD-10-CM | POA: Diagnosis not present

## 2017-08-12 DIAGNOSIS — M150 Primary generalized (osteo)arthritis: Secondary | ICD-10-CM | POA: Diagnosis not present

## 2017-08-13 DIAGNOSIS — F0391 Unspecified dementia with behavioral disturbance: Secondary | ICD-10-CM | POA: Diagnosis not present

## 2017-08-13 DIAGNOSIS — M6281 Muscle weakness (generalized): Secondary | ICD-10-CM | POA: Diagnosis not present

## 2017-08-13 DIAGNOSIS — E118 Type 2 diabetes mellitus with unspecified complications: Secondary | ICD-10-CM | POA: Diagnosis not present

## 2017-08-13 DIAGNOSIS — R41841 Cognitive communication deficit: Secondary | ICD-10-CM | POA: Diagnosis not present

## 2017-08-13 DIAGNOSIS — M150 Primary generalized (osteo)arthritis: Secondary | ICD-10-CM | POA: Diagnosis not present

## 2017-08-14 DIAGNOSIS — F0391 Unspecified dementia with behavioral disturbance: Secondary | ICD-10-CM | POA: Diagnosis not present

## 2017-08-14 DIAGNOSIS — E118 Type 2 diabetes mellitus with unspecified complications: Secondary | ICD-10-CM | POA: Diagnosis not present

## 2017-08-14 DIAGNOSIS — M150 Primary generalized (osteo)arthritis: Secondary | ICD-10-CM | POA: Diagnosis not present

## 2017-08-14 DIAGNOSIS — R41841 Cognitive communication deficit: Secondary | ICD-10-CM | POA: Diagnosis not present

## 2017-08-14 DIAGNOSIS — M6281 Muscle weakness (generalized): Secondary | ICD-10-CM | POA: Diagnosis not present

## 2017-08-15 DIAGNOSIS — R41841 Cognitive communication deficit: Secondary | ICD-10-CM | POA: Diagnosis not present

## 2017-08-15 DIAGNOSIS — M6281 Muscle weakness (generalized): Secondary | ICD-10-CM | POA: Diagnosis not present

## 2017-08-15 DIAGNOSIS — F0391 Unspecified dementia with behavioral disturbance: Secondary | ICD-10-CM | POA: Diagnosis not present

## 2017-08-15 DIAGNOSIS — F0151 Vascular dementia with behavioral disturbance: Secondary | ICD-10-CM | POA: Diagnosis not present

## 2017-08-15 DIAGNOSIS — E118 Type 2 diabetes mellitus with unspecified complications: Secondary | ICD-10-CM | POA: Diagnosis not present

## 2017-08-15 DIAGNOSIS — F251 Schizoaffective disorder, depressive type: Secondary | ICD-10-CM | POA: Diagnosis not present

## 2017-08-15 DIAGNOSIS — M150 Primary generalized (osteo)arthritis: Secondary | ICD-10-CM | POA: Diagnosis not present

## 2017-08-16 ENCOUNTER — Encounter: Payer: Self-pay | Admitting: Podiatry

## 2017-08-16 ENCOUNTER — Ambulatory Visit (INDEPENDENT_AMBULATORY_CARE_PROVIDER_SITE_OTHER): Payer: Medicare Other | Admitting: Podiatry

## 2017-08-16 DIAGNOSIS — M79674 Pain in right toe(s): Secondary | ICD-10-CM

## 2017-08-16 DIAGNOSIS — M79675 Pain in left toe(s): Secondary | ICD-10-CM

## 2017-08-16 DIAGNOSIS — B351 Tinea unguium: Secondary | ICD-10-CM

## 2017-08-16 NOTE — Progress Notes (Signed)
Subjective:   Patient ID: Debra Tran, female   DOB: 84 y.o.   MRN: [id number]   HPI Patient presents with irritative long painful nailbeds 1-5 both feet that makes it hard for her to wear shoe gear with.  Has been present for years and trimming helps   ROS      Objective:  Physical Exam  Neurovascular status unchanged with thick incurvated nailbeds 1-5 both feet that are painful     Assessment:  Chronic mycotic nail infection 1-5 both feet     Plan:  Debride painful nailbeds 1-5 both feet with no iatrogenic bleeding noted

## 2017-08-18 DIAGNOSIS — M6281 Muscle weakness (generalized): Secondary | ICD-10-CM | POA: Diagnosis not present

## 2017-08-18 DIAGNOSIS — M150 Primary generalized (osteo)arthritis: Secondary | ICD-10-CM | POA: Diagnosis not present

## 2017-08-18 DIAGNOSIS — E118 Type 2 diabetes mellitus with unspecified complications: Secondary | ICD-10-CM | POA: Diagnosis not present

## 2017-08-18 DIAGNOSIS — R41841 Cognitive communication deficit: Secondary | ICD-10-CM | POA: Diagnosis not present

## 2017-08-18 DIAGNOSIS — F0391 Unspecified dementia with behavioral disturbance: Secondary | ICD-10-CM | POA: Diagnosis not present

## 2017-08-19 DIAGNOSIS — M6281 Muscle weakness (generalized): Secondary | ICD-10-CM | POA: Diagnosis not present

## 2017-08-19 DIAGNOSIS — E118 Type 2 diabetes mellitus with unspecified complications: Secondary | ICD-10-CM | POA: Diagnosis not present

## 2017-08-19 DIAGNOSIS — F0391 Unspecified dementia with behavioral disturbance: Secondary | ICD-10-CM | POA: Diagnosis not present

## 2017-08-19 DIAGNOSIS — R41841 Cognitive communication deficit: Secondary | ICD-10-CM | POA: Diagnosis not present

## 2017-08-19 DIAGNOSIS — M150 Primary generalized (osteo)arthritis: Secondary | ICD-10-CM | POA: Diagnosis not present

## 2017-08-20 DIAGNOSIS — F0391 Unspecified dementia with behavioral disturbance: Secondary | ICD-10-CM | POA: Diagnosis not present

## 2017-08-20 DIAGNOSIS — E118 Type 2 diabetes mellitus with unspecified complications: Secondary | ICD-10-CM | POA: Diagnosis not present

## 2017-08-20 DIAGNOSIS — R41841 Cognitive communication deficit: Secondary | ICD-10-CM | POA: Diagnosis not present

## 2017-08-20 DIAGNOSIS — M6281 Muscle weakness (generalized): Secondary | ICD-10-CM | POA: Diagnosis not present

## 2017-08-20 DIAGNOSIS — M150 Primary generalized (osteo)arthritis: Secondary | ICD-10-CM | POA: Diagnosis not present

## 2017-08-21 DIAGNOSIS — R41841 Cognitive communication deficit: Secondary | ICD-10-CM | POA: Diagnosis not present

## 2017-08-21 DIAGNOSIS — M150 Primary generalized (osteo)arthritis: Secondary | ICD-10-CM | POA: Diagnosis not present

## 2017-08-21 DIAGNOSIS — M6281 Muscle weakness (generalized): Secondary | ICD-10-CM | POA: Diagnosis not present

## 2017-08-21 DIAGNOSIS — E118 Type 2 diabetes mellitus with unspecified complications: Secondary | ICD-10-CM | POA: Diagnosis not present

## 2017-08-21 DIAGNOSIS — F0391 Unspecified dementia with behavioral disturbance: Secondary | ICD-10-CM | POA: Diagnosis not present

## 2017-08-22 DIAGNOSIS — M6281 Muscle weakness (generalized): Secondary | ICD-10-CM | POA: Diagnosis not present

## 2017-08-22 DIAGNOSIS — M150 Primary generalized (osteo)arthritis: Secondary | ICD-10-CM | POA: Diagnosis not present

## 2017-08-22 DIAGNOSIS — E118 Type 2 diabetes mellitus with unspecified complications: Secondary | ICD-10-CM | POA: Diagnosis not present

## 2017-08-22 DIAGNOSIS — R41841 Cognitive communication deficit: Secondary | ICD-10-CM | POA: Diagnosis not present

## 2017-08-22 DIAGNOSIS — F0391 Unspecified dementia with behavioral disturbance: Secondary | ICD-10-CM | POA: Diagnosis not present

## 2017-08-28 DIAGNOSIS — F0391 Unspecified dementia with behavioral disturbance: Secondary | ICD-10-CM | POA: Diagnosis not present

## 2017-08-28 DIAGNOSIS — R41841 Cognitive communication deficit: Secondary | ICD-10-CM | POA: Diagnosis not present

## 2017-08-28 DIAGNOSIS — E118 Type 2 diabetes mellitus with unspecified complications: Secondary | ICD-10-CM | POA: Diagnosis not present

## 2017-08-28 DIAGNOSIS — M6281 Muscle weakness (generalized): Secondary | ICD-10-CM | POA: Diagnosis not present

## 2017-08-28 DIAGNOSIS — M150 Primary generalized (osteo)arthritis: Secondary | ICD-10-CM | POA: Diagnosis not present

## 2017-08-29 ENCOUNTER — Non-Acute Institutional Stay (SKILLED_NURSING_FACILITY): Payer: Medicare Other | Admitting: Adult Health

## 2017-08-29 ENCOUNTER — Encounter: Payer: Self-pay | Admitting: Adult Health

## 2017-08-29 DIAGNOSIS — Z794 Long term (current) use of insulin: Secondary | ICD-10-CM

## 2017-08-29 DIAGNOSIS — E118 Type 2 diabetes mellitus with unspecified complications: Secondary | ICD-10-CM | POA: Diagnosis not present

## 2017-08-29 DIAGNOSIS — M6281 Muscle weakness (generalized): Secondary | ICD-10-CM | POA: Diagnosis not present

## 2017-08-29 DIAGNOSIS — R41841 Cognitive communication deficit: Secondary | ICD-10-CM | POA: Diagnosis not present

## 2017-08-29 DIAGNOSIS — F0391 Unspecified dementia with behavioral disturbance: Secondary | ICD-10-CM | POA: Diagnosis not present

## 2017-08-29 DIAGNOSIS — F251 Schizoaffective disorder, depressive type: Secondary | ICD-10-CM | POA: Diagnosis not present

## 2017-08-29 DIAGNOSIS — E119 Type 2 diabetes mellitus without complications: Secondary | ICD-10-CM

## 2017-08-29 DIAGNOSIS — F0151 Vascular dementia with behavioral disturbance: Secondary | ICD-10-CM | POA: Diagnosis not present

## 2017-08-29 DIAGNOSIS — M150 Primary generalized (osteo)arthritis: Secondary | ICD-10-CM | POA: Diagnosis not present

## 2017-08-29 NOTE — Progress Notes (Signed)
Location:   [facility] Room Number: 225 B Place of Service:  SNF (31)   CODE STATUS: Full Code (Most form updated 05/02/17)  Allergies  Allergen Reactions  . Ace Inhibitors     Unknown reaction per MAR   . Ether Nausea And Vomiting    Chief Complaint  Patient presents with  . Acute Visit    diabetes     HPI:  Her cbg reading are good. There are no reports of hypoglycemia. She is taking novolog 8 units with meals; and levemir 14 units nightly. There are no reports of missed doses. She denies any excessive hunger or thirst; or urination. There are no nursing concerns at this time.   Past Medical History:  Diagnosis Date  . Allergic rhinitis   . CATARACT 06/06/2006   Qualifier: Diagnosis of  By: Genene Churn MD, Janett Billow    . Depressive disorder   . Diabetes mellitus type 2 in obese ([place])   . Essential hypertension, benign 02/24/2015  . Fungal dermatitis   . Hyperlipidemia   . Macular degeneration   . Mild cognitive impairment   . Narcissism ([place])   . Normocytic anemia   . Phobia   . Schizophrenia, paranoid ([place])   . Stress incontinence, female     Past Surgical History:  Procedure Laterality Date  . ABDOMINAL HYSTERECTOMY     due to fibroids  . Avastin Ingection Right 03/26/2017   In Right eye  . CATARACT EXTRACTION  6 16 2006  . CRYOTHERAPY  2 15 2006   facial AK  . hyperplastic   4 20 2006   AK shave biopsy  . intraocular injection  [id number]  . TOTAL ABDOMINAL HYSTERECTOMY W/ BILATERAL SALPINGOOPHORECTOMY     due to endometriosis  non malignant    Social History   Socioeconomic History  . Marital status: Divorced    Spouse name: Not on file  . Number of children: Not on file  . Years of education: Not on file  . Highest education level: Not on file  Occupational History  . Not on file  Social Needs  . Financial resource strain: Not on file  . Food insecurity:    Worry: Not on file    Inability: Not on file  . Transportation needs:    Medical: Not on file    Non-medical: Not on file  Tobacco Use  . Smoking status: Never Smoker  . Smokeless tobacco: Never Used  Substance and Sexual Activity  . Alcohol use: No  . Drug use: No  . Sexual activity: Not on file  Lifestyle  . Physical activity:    Days per week: Not on file    Minutes per session: Not on file  . Stress: Not on file  Relationships  . Social connections:    Talks on phone: Not on file    Gets together: Not on file    Attends religious service: Not on file    Active member of club or organization: Not on file    Attends meetings of clubs or organizations: Not on file    Relationship status: Not on file  . Intimate partner violence:    Fear of current or ex partner: Not on file    Emotionally abused: Not on file    Physically abused: Not on file    Forced sexual activity: Not on file  Other Topics Concern  . Not on file  Social History Narrative  . Not on file  Family History  Problem Relation Age of Onset  . Heart disease Mother   . Heart disease Father   . Stroke Father   . Cancer Cousin        liver and colon  . Stroke Sister       VITAL SIGNS BP 136/76   Pulse 78   Temp 97.6 F (36.4 C)   Resp 20   Ht 4\' 9"  (1.448 m)   Wt 153 lb (69.4 kg)   SpO2 96%   BMI 33.11 kg/m   Outpatient Encounter Medications as of 08/29/2017  Medication Sig  . acetaminophen (TYLENOL) 325 MG tablet Take 650 mg by mouth every 6 (six) hours as needed for mild pain, moderate pain or fever.   Marland Kitchen aspirin EC 325 MG tablet Take 1 tablet (325 mg total) by mouth daily.  Marland Kitchen atorvastatin (LIPITOR) 20 MG tablet Take 1 tablet (20 mg total) by mouth at bedtime.  . benztropine (COGENTIN) 1 MG tablet Take 1 mg by mouth at bedtime.  . Calcium Carbonate-Vitamin D (CALCIUM 600-D) 600-400 MG-UNIT per tablet Take 1 tablet by mouth at bedtime.   . cholecalciferol (VITAMIN D) 1000 UNITS tablet Take 1,000 Units by mouth at bedtime.   . cyanocobalamin 1000 MCG tablet Take  1,000 mcg by mouth daily with breakfast.   . docusate sodium (COLACE) 100 MG capsule Take 100 mg by mouth daily.  Marland Kitchen donepezil (ARICEPT) 5 MG tablet Take 5 mg by mouth at bedtime.   Marland Kitchen escitalopram (LEXAPRO) 20 MG tablet Take 20 mg by mouth daily with breakfast.   . Hypromellose 0.4 % SOLN Place 1 drop into both eyes 3 (three) times daily.  . insulin aspart (NOVOLOG) 100 UNIT/ML injection Inject 8 Units into the skin 3 (three) times daily after meals. Notify MD if BS is less than 70 or greater than 450. Hold Insulin if BS is less than 70  . Insulin Detemir (LEVEMIR FLEXPEN) 100 UNIT/ML Pen Inject 14 Units into the skin at bedtime.   Marland Kitchen ipratropium (ATROVENT) 0.03 % nasal spray Place 2 sprays into both nostrils at bedtime. And at bedtime   . ipratropium (ATROVENT) 0.03 % nasal spray Place 2 sprays into both nostrils 3 (three) times daily before meals.  Marland Kitchen ipratropium-albuterol (DUONEB) 0.5-2.5 (3) MG/3ML SOLN Take 3 mLs by nebulization every 6 (six) hours as needed (for shortness of breath).   . lip balm (CARMEX) ointment 1 application. Apply to lips 4 times daily for cold sore  . loratadine (CLARITIN) 10 MG tablet Take 10 mg by mouth daily with breakfast.   . losartan (COZAAR) 50 MG tablet Take 50 mg by mouth daily with breakfast.   . Lotion Base LOTN Apply 1 application topically 2 (two) times daily.  . Memantine HCl ER (NAMENDA XR) 28 MG CP24 Take 28 mg by mouth daily with breakfast. For dementia  . Multiple Vitamin (MULTIVITAMIN) tablet Take 1 tablet by mouth daily.  . Nutritional Supplements (NUTRITIONAL SUPPLEMENT PO) Regular Diet - Pureed texture, Nectar Thickened fluids consistency.  Large portions  . Nutritional Supplements (PROMOD PO) Give 65ml by mouth two times daily  . OLANZapine (ZYPREXA) 5 MG tablet Take 5 mg by mouth at bedtime.  Marland Kitchen omeprazole (PRILOSEC) 20 MG capsule Take 20 mg by mouth daily at 6 (six) AM.  . white petrolatum (VASELINE) GEL Apply to left forehead every shift   No  facility-administered encounter medications on file as of 08/29/2017.      SIGNIFICANT DIAGNOSTIC EXAMS  PREVIOUS  12-30-14: right eye intraocular injection     03-12-16: EEG: This is a mildly abnormal EEG due to the presence of  moderate generalized slowing which is indicative of bihemispheric dysfunction which is a nonspecific finding seen in a variety of degenerative, hypoxic, ischemic, toxic, metabolic etiologies. No definite epileptiform activity is noted  12-03-16: chest x-ray: no acute cardiopulmonary disease  01-10-17: chest x-ray: no acute cardiopulmonary disease  04-02-17: ct of head:  1. No acute intracranial pathology seen on CT. 2. Mild to moderate cortical volume loss and scattered small vessel ischemic microangiopathy. 3. Partial opacification of the mastoid air cells bilaterally.  04-02-17: ct angio of head and neck: Minor extracranial and intracranial atheromatous change, not unexpected for age, and stable from 2017. No large vessel occlusion, dissection, or other significant vascular finding. Atrophy and small vessel disease without acute intracranial process.  04-02-17: MRI of brain: Atrophy and small vessel disease. No acute intracranial findings.  NO NEW EXAMS      LABS REVIEWED: PREVIOUS   07-25-16: wbc 7.8; hgb 13.0; hct 39.9; mcv 84.9;  glucose 203; bun 15.5; creat 0.73; k+ 4.4 na++ 138; ca 9.1; hgb a1c 7.2  07-26-16: chol 140; ldl 92; trig 142; hdl 43   11-02-16: glucose 198; bun 17.0; creat 0.89; k+ 4.7; na++ 137; liver normal albumin 4.0 hgb a1c 6.9; chol 162; ldl 85; trig 157; hdl 45 03-29-17: chol 162; ldl 75 trig 214; hdl 44; hgb a1c 9.4 04-02-17: wbc 8.5; hgb 14.2; hct 44.1; mcv 86.0; plt 213; glucose 175; bun 20; creat 1.10; k+ 4.2; na++ 138; ca 9.0; liver normal albumin 3.1; ammonia 14 04-04-17: wbc 8.7; hgb 11.1; hct 34.0; mcv 85.4; plt 188; glucose 97; bun 16; creat 0.87; k+ 3.7; na++137; ca 8.1 05-10-17: wbc 7.3; hgb 12.7; hct 39.7; mcv 85.3; plt 189;  glucose 210; bun 24.2; creat 0.92; k+ 4.4; na++ 140; ca 9.2   NO NEW LABS    Review of Systems  Constitutional: Negative for malaise/fatigue.  Respiratory: Negative for cough and shortness of breath.   Cardiovascular: Negative for chest pain, palpitations and leg swelling.  Gastrointestinal: Negative for abdominal pain, constipation and heartburn.  Musculoskeletal: Negative for back pain, joint pain and myalgias.  Skin: Negative.   Neurological: Negative for dizziness.  Psychiatric/Behavioral: The patient is not nervous/anxious.    Physical Exam  Constitutional: She appears well-developed and well-nourished. No distress.  Neck: No thyromegaly present.  Cardiovascular: Normal rate, regular rhythm and intact distal pulses.  Murmur heard. 1/6  Pulmonary/Chest: Effort normal and breath sounds normal. No respiratory distress.  Abdominal: Soft. Bowel sounds are normal. She exhibits no distension. There is no tenderness.  Musculoskeletal: Normal range of motion. She exhibits no edema.  Lymphadenopathy:    She has no cervical adenopathy.  Neurological: She is alert.  Skin: Skin is warm and dry. She is not diaphoretic.  Psychiatric: She has a normal mood and affect.      ASSESSMENT/ PLAN:  TODAY:   1. Controlled diabetes type II without complication with long term insulin use:  stable  hgb a1c 6.9 (previous 7.2)  Will continue levemir 14  units;  novolog  8 units with meals   Is on  statin and arb and asa     MD is aware of resident's narcotic use and is in agreement with current plan of care. We will attempt to wean resident as apropriate   Ok Edwards NP [facility]  Contact [phone] Monday through Friday 8am- 5pm  After hours call [phone]

## 2017-08-30 DIAGNOSIS — R41841 Cognitive communication deficit: Secondary | ICD-10-CM | POA: Diagnosis not present

## 2017-08-30 DIAGNOSIS — M6281 Muscle weakness (generalized): Secondary | ICD-10-CM | POA: Diagnosis not present

## 2017-08-30 DIAGNOSIS — F0391 Unspecified dementia with behavioral disturbance: Secondary | ICD-10-CM | POA: Diagnosis not present

## 2017-08-30 DIAGNOSIS — E118 Type 2 diabetes mellitus with unspecified complications: Secondary | ICD-10-CM | POA: Diagnosis not present

## 2017-08-30 DIAGNOSIS — M150 Primary generalized (osteo)arthritis: Secondary | ICD-10-CM | POA: Diagnosis not present

## 2017-09-01 DIAGNOSIS — M150 Primary generalized (osteo)arthritis: Secondary | ICD-10-CM | POA: Diagnosis not present

## 2017-09-01 DIAGNOSIS — E118 Type 2 diabetes mellitus with unspecified complications: Secondary | ICD-10-CM | POA: Diagnosis not present

## 2017-09-01 DIAGNOSIS — R41841 Cognitive communication deficit: Secondary | ICD-10-CM | POA: Diagnosis not present

## 2017-09-01 DIAGNOSIS — F0391 Unspecified dementia with behavioral disturbance: Secondary | ICD-10-CM | POA: Diagnosis not present

## 2017-09-01 DIAGNOSIS — M6281 Muscle weakness (generalized): Secondary | ICD-10-CM | POA: Diagnosis not present

## 2017-09-02 DIAGNOSIS — F0391 Unspecified dementia with behavioral disturbance: Secondary | ICD-10-CM | POA: Diagnosis not present

## 2017-09-02 DIAGNOSIS — E118 Type 2 diabetes mellitus with unspecified complications: Secondary | ICD-10-CM | POA: Diagnosis not present

## 2017-09-02 DIAGNOSIS — R41841 Cognitive communication deficit: Secondary | ICD-10-CM | POA: Diagnosis not present

## 2017-09-02 DIAGNOSIS — M150 Primary generalized (osteo)arthritis: Secondary | ICD-10-CM | POA: Diagnosis not present

## 2017-09-02 DIAGNOSIS — M6281 Muscle weakness (generalized): Secondary | ICD-10-CM | POA: Diagnosis not present

## 2017-09-03 DIAGNOSIS — M6281 Muscle weakness (generalized): Secondary | ICD-10-CM | POA: Diagnosis not present

## 2017-09-03 DIAGNOSIS — M150 Primary generalized (osteo)arthritis: Secondary | ICD-10-CM | POA: Diagnosis not present

## 2017-09-03 DIAGNOSIS — R41841 Cognitive communication deficit: Secondary | ICD-10-CM | POA: Diagnosis not present

## 2017-09-03 DIAGNOSIS — F0391 Unspecified dementia with behavioral disturbance: Secondary | ICD-10-CM | POA: Diagnosis not present

## 2017-09-03 DIAGNOSIS — E118 Type 2 diabetes mellitus with unspecified complications: Secondary | ICD-10-CM | POA: Diagnosis not present

## 2017-09-04 DIAGNOSIS — M150 Primary generalized (osteo)arthritis: Secondary | ICD-10-CM | POA: Diagnosis not present

## 2017-09-04 DIAGNOSIS — R41841 Cognitive communication deficit: Secondary | ICD-10-CM | POA: Diagnosis not present

## 2017-09-04 DIAGNOSIS — E118 Type 2 diabetes mellitus with unspecified complications: Secondary | ICD-10-CM | POA: Diagnosis not present

## 2017-09-04 DIAGNOSIS — M6281 Muscle weakness (generalized): Secondary | ICD-10-CM | POA: Diagnosis not present

## 2017-09-04 DIAGNOSIS — F0391 Unspecified dementia with behavioral disturbance: Secondary | ICD-10-CM | POA: Diagnosis not present

## 2017-09-05 ENCOUNTER — Non-Acute Institutional Stay (SKILLED_NURSING_FACILITY): Payer: Medicare Other | Admitting: Adult Health

## 2017-09-05 ENCOUNTER — Encounter: Payer: Self-pay | Admitting: Adult Health

## 2017-09-05 DIAGNOSIS — K5909 Other constipation: Secondary | ICD-10-CM | POA: Diagnosis not present

## 2017-09-05 DIAGNOSIS — F32A Depression, unspecified: Secondary | ICD-10-CM

## 2017-09-05 DIAGNOSIS — R1312 Dysphagia, oropharyngeal phase: Secondary | ICD-10-CM

## 2017-09-05 DIAGNOSIS — F0151 Vascular dementia with behavioral disturbance: Secondary | ICD-10-CM | POA: Diagnosis not present

## 2017-09-05 DIAGNOSIS — K219 Gastro-esophageal reflux disease without esophagitis: Secondary | ICD-10-CM | POA: Diagnosis not present

## 2017-09-05 DIAGNOSIS — M6281 Muscle weakness (generalized): Secondary | ICD-10-CM | POA: Diagnosis not present

## 2017-09-05 DIAGNOSIS — F251 Schizoaffective disorder, depressive type: Secondary | ICD-10-CM | POA: Diagnosis not present

## 2017-09-05 DIAGNOSIS — R41841 Cognitive communication deficit: Secondary | ICD-10-CM | POA: Diagnosis not present

## 2017-09-05 DIAGNOSIS — E118 Type 2 diabetes mellitus with unspecified complications: Secondary | ICD-10-CM | POA: Diagnosis not present

## 2017-09-05 DIAGNOSIS — F329 Major depressive disorder, single episode, unspecified: Secondary | ICD-10-CM

## 2017-09-05 DIAGNOSIS — M150 Primary generalized (osteo)arthritis: Secondary | ICD-10-CM | POA: Diagnosis not present

## 2017-09-05 DIAGNOSIS — F0391 Unspecified dementia with behavioral disturbance: Secondary | ICD-10-CM | POA: Diagnosis not present

## 2017-09-05 NOTE — Progress Notes (Signed)
Location:   [facility] Room Number: 225 B Place of Service:  SNF (31)   CODE STATUS: Full Code (Most form updated 05/02/17)   Allergies  Allergen Reactions  . Ace Inhibitors     Unknown reaction per MAR   . Ether Nausea And Vomiting    Chief Complaint  Patient presents with  . Medical Management of Chronic Issues    Dysphagia; gerd; constipation; depression     HPI:  She is a 84 year old long term resident of this facility being seen for the management of her chronic illnesses: dysphagia; gerd; constipation; depression. She denies any issues with constipation; no heart burn; no choking. There are no nursing concerns at this time.    Past Medical History:  Diagnosis Date  . Allergic rhinitis   . CATARACT 06/06/2006   Qualifier: Diagnosis of  By: Genene Churn MD, Janett Billow    . Depressive disorder   . Diabetes mellitus type 2 in obese ([place])   . Essential hypertension, benign 02/24/2015  . Fungal dermatitis   . Hyperlipidemia   . Macular degeneration   . Mild cognitive impairment   . Narcissism ([place])   . Normocytic anemia   . Phobia   . Schizophrenia, paranoid ([place])   . Stress incontinence, female     Past Surgical History:  Procedure Laterality Date  . ABDOMINAL HYSTERECTOMY     due to fibroids  . Avastin Ingection Right 03/26/2017   In Right eye  . CATARACT EXTRACTION  6 16 2006  . CRYOTHERAPY  2 15 2006   facial AK  . hyperplastic   4 20 2006   AK shave biopsy  . intraocular injection  [id number]  . TOTAL ABDOMINAL HYSTERECTOMY W/ BILATERAL SALPINGOOPHORECTOMY     due to endometriosis  non malignant    Social History   Socioeconomic History  . Marital status: Divorced    Spouse name: Not on file  . Number of children: Not on file  . Years of education: Not on file  . Highest education level: Not on file  Occupational History  . Not on file  Social Needs  . Financial resource strain: Not on file  . Food insecurity:    Worry: Not on file      Inability: Not on file  . Transportation needs:    Medical: Not on file    Non-medical: Not on file  Tobacco Use  . Smoking status: Never Smoker  . Smokeless tobacco: Never Used  Substance and Sexual Activity  . Alcohol use: No  . Drug use: No  . Sexual activity: Not on file  Lifestyle  . Physical activity:    Days per week: Not on file    Minutes per session: Not on file  . Stress: Not on file  Relationships  . Social connections:    Talks on phone: Not on file    Gets together: Not on file    Attends religious service: Not on file    Active member of club or organization: Not on file    Attends meetings of clubs or organizations: Not on file    Relationship status: Not on file  . Intimate partner violence:    Fear of current or ex partner: Not on file    Emotionally abused: Not on file    Physically abused: Not on file    Forced sexual activity: Not on file  Other Topics Concern  . Not on file  Social History Narrative  .  Not on file   Family History  Problem Relation Age of Onset  . Heart disease Mother   . Heart disease Father   . Stroke Father   . Cancer Cousin        liver and colon  . Stroke Sister       VITAL SIGNS BP 136/76   Pulse 78   Temp 97.6 F (36.4 C)   Resp 20   Ht 4\' 9"  (1.448 m)   Wt 156 lb 9.6 oz (71 kg)   SpO2 96%   BMI 33.89 kg/m    Outpatient Encounter Medications as of 09/05/2017  Medication Sig  . acetaminophen (TYLENOL) 325 MG tablet Take 650 mg by mouth every 6 (six) hours as needed for mild pain, moderate pain or fever.   Marland Kitchen aspirin EC 325 MG tablet Take 1 tablet (325 mg total) by mouth daily.  Marland Kitchen atorvastatin (LIPITOR) 20 MG tablet Take 1 tablet (20 mg total) by mouth at bedtime.  . benztropine (COGENTIN) 1 MG tablet Take 1 mg by mouth at bedtime.  . Calcium Carbonate-Vitamin D (CALCIUM 600-D) 600-400 MG-UNIT per tablet Take 1 tablet by mouth at bedtime.   . cholecalciferol (VITAMIN D) 1000 UNITS tablet Take 1,000 Units by  mouth at bedtime.   . cyanocobalamin 1000 MCG tablet Take 1,000 mcg by mouth daily with breakfast.   . docusate sodium (COLACE) 100 MG capsule Take 100 mg by mouth daily.  Marland Kitchen donepezil (ARICEPT) 5 MG tablet Take 5 mg by mouth at bedtime.   Marland Kitchen escitalopram (LEXAPRO) 20 MG tablet Take 20 mg by mouth daily with breakfast.   . Hypromellose 0.4 % SOLN Place 1 drop into both eyes 3 (three) times daily.  . insulin aspart (NOVOLOG) 100 UNIT/ML injection Inject 8 Units into the skin 3 (three) times daily after meals. Notify MD if BS is less than 70 or greater than 450. Hold Insulin if BS is less than 70  . Insulin Detemir (LEVEMIR FLEXPEN) 100 UNIT/ML Pen Inject 14 Units into the skin at bedtime.   Marland Kitchen ipratropium (ATROVENT) 0.03 % nasal spray Place 2 sprays into both nostrils at bedtime. And at bedtime   . ipratropium (ATROVENT) 0.03 % nasal spray Place 2 sprays into both nostrils 3 (three) times daily before meals.  Marland Kitchen ipratropium-albuterol (DUONEB) 0.5-2.5 (3) MG/3ML SOLN Take 3 mLs by nebulization every 6 (six) hours as needed (for shortness of breath).   . lip balm (CARMEX) ointment 1 application. Apply to lips 4 times daily for cold sore  . loratadine (CLARITIN) 10 MG tablet Take 10 mg by mouth daily with breakfast.   . losartan (COZAAR) 50 MG tablet Take 50 mg by mouth daily with breakfast.   . Lotion Base LOTN Apply 1 application topically 2 (two) times daily.  . Memantine HCl ER (NAMENDA XR) 28 MG CP24 Take 28 mg by mouth daily with breakfast. For dementia  . Multiple Vitamin (MULTIVITAMIN) tablet Take 1 tablet by mouth daily.  . Nutritional Supplements (NUTRITIONAL SUPPLEMENT PO) Regular Diet - Pureed texture, Nectar Thickened fluids consistency.  Large portions  . Nutritional Supplements (NUTRITIONAL SUPPLEMENT PO) Frozen Nutritional treat two times daily for weight support  . OLANZapine (ZYPREXA) 5 MG tablet Take 5 mg by mouth at bedtime.  Marland Kitchen omeprazole (PRILOSEC) 20 MG capsule Take 20 mg by mouth  daily at 6 (six) AM.  . white petrolatum (VASELINE) GEL Apply to left forehead every shift  . [DISCONTINUED] Nutritional Supplements (PROMOD PO) Give 70ml by  mouth two times daily   No facility-administered encounter medications on file as of 09/05/2017.      SIGNIFICANT DIAGNOSTIC EXAMS  PREVIOUS  12-30-14: right eye intraocular injection     03-12-16: EEG: This is a mildly abnormal EEG due to the presence of  moderate generalized slowing which is indicative of bihemispheric dysfunction which is a nonspecific finding seen in a variety of degenerative, hypoxic, ischemic, toxic, metabolic etiologies. No definite epileptiform activity is noted  12-03-16: chest x-ray: no acute cardiopulmonary disease  01-10-17: chest x-ray: no acute cardiopulmonary disease  04-02-17: ct of head:  1. No acute intracranial pathology seen on CT. 2. Mild to moderate cortical volume loss and scattered small vessel ischemic microangiopathy. 3. Partial opacification of the mastoid air cells bilaterally.  04-02-17: ct angio of head and neck: Minor extracranial and intracranial atheromatous change, not unexpected for age, and stable from 2017. No large vessel occlusion, dissection, or other significant vascular finding. Atrophy and small vessel disease without acute intracranial process.  04-02-17: MRI of brain: Atrophy and small vessel disease. No acute intracranial findings.  NO NEW EXAMS      LABS REVIEWED: PREVIOUS    11-02-16: glucose 198; bun 17.0; creat 0.89; k+ 4.7; na++ 137; liver normal albumin 4.0 hgb a1c 6.9; chol 162; ldl 85; trig 157; hdl 45 03-29-17: chol 162; ldl 75 trig 214; hdl 44; hgb a1c 9.4 04-02-17: wbc 8.5; hgb 14.2; hct 44.1; mcv 86.0; plt 213; glucose 175; bun 20; creat 1.10; k+ 4.2; na++ 138; ca 9.0; liver normal albumin 3.1; ammonia 14 04-04-17: wbc 8.7; hgb 11.1; hct 34.0; mcv 85.4; plt 188; glucose 97; bun 16; creat 0.87; k+ 3.7; na++137; ca 8.1 05-10-17: wbc 7.3; hgb 12.7; hct 39.7;  mcv 85.3; plt 189; glucose 210; bun 24.2; creat 0.92; k+ 4.4; na++ 140; ca 9.2   NO NEW LABS    Review of Systems  Constitutional: Negative for malaise/fatigue.  Respiratory: Negative for cough and shortness of breath.   Cardiovascular: Negative for chest pain, palpitations and leg swelling.  Gastrointestinal: Negative for abdominal pain, constipation and heartburn.  Musculoskeletal: Negative for back pain, joint pain and myalgias.  Skin: Negative.   Neurological: Negative for dizziness.  Psychiatric/Behavioral: The patient is not nervous/anxious.     Physical Exam  Constitutional: She appears well-developed and well-nourished. No distress.  Neck: No thyromegaly present.  Cardiovascular: Normal rate, regular rhythm and intact distal pulses.  Murmur heard. 1/6  Pulmonary/Chest: Effort normal and breath sounds normal. No respiratory distress.  Abdominal: Soft. Bowel sounds are normal. She exhibits no distension. There is no tenderness.  Musculoskeletal: Normal range of motion. She exhibits no edema.  Uses wheelchair   Lymphadenopathy:    She has no cervical adenopathy.  Skin: Skin is warm and dry. She is not diaphoretic.  Psychiatric: She has a normal mood and affect.      ASSESSMENT/ PLAN:  TODAY:   1. Dysphagia, oropharyngeal phase:stable she is on nectar thick liquids; no signs of aspiration present   2. Gerd without esophagitis: stable will continue prilosec 20  mg daily   3. Chronic constipation stable : will continue colace daily    4. Depressive dissorder: stable  will continue lexapro 20 mg daily will monitor   PREVIOUS    5. Schizophrenia: is stable continue  zyprexa 5  mg nightly and will monitor   Is followed by psych services.  Will continue cogentin 1 mg nightly for tardive dyskinesia and will monitor   6.  Post nasal drip syndrome: is stable;   will continue atrovent nasal spray four times daily   7. Chronic allergic bronchitis: will continue claritin 10  mg daily  8. TIA: is presently stable; will continue asa 325 mg daily   9. Essential hypertension benign: b/p 136/76  is stable : will continue cozaar 50 mg daily   10. Dementia without behavior disturbance; is without change will continue aricept 10 mg daily and namenda xr 28 mg daily will not make changes will monitor   Her current weight is 156 pounds and is currently stable.   11. Tardive dyskinesia: stable  she does not have a resting tremor or lip tremor . Will continue cogentin 1 mg nightly   12. Hyperlipidemia associated with type 2 diabetes mellitus: stable  ldl 75; trig 214  will continue lipitor 20 mg daily   13. Controlled diabetes type II without complication with long term insulin use:  stable  hgb a1c 6.9 (previous 7.2)  Will continue levemir 14  units;  novolog  8 units with meals   Is on  statin and arb, asa   Will check lipids; hgb a1c; urine micro-albumin   MD is aware of resident's narcotic use and is in agreement with current plan of care. We will attempt to wean resident as apropriate   Ok Edwards NP [facility]  Contact [phone] Monday through Friday 8am- 5pm  After hours call [phone]

## 2017-09-08 DIAGNOSIS — F0391 Unspecified dementia with behavioral disturbance: Secondary | ICD-10-CM | POA: Diagnosis not present

## 2017-09-08 DIAGNOSIS — R41841 Cognitive communication deficit: Secondary | ICD-10-CM | POA: Diagnosis not present

## 2017-09-09 DIAGNOSIS — R41841 Cognitive communication deficit: Secondary | ICD-10-CM | POA: Diagnosis not present

## 2017-09-09 DIAGNOSIS — F0391 Unspecified dementia with behavioral disturbance: Secondary | ICD-10-CM | POA: Diagnosis not present

## 2017-09-10 DIAGNOSIS — F0391 Unspecified dementia with behavioral disturbance: Secondary | ICD-10-CM | POA: Diagnosis not present

## 2017-09-10 DIAGNOSIS — R41841 Cognitive communication deficit: Secondary | ICD-10-CM | POA: Diagnosis not present

## 2017-09-11 DIAGNOSIS — F0391 Unspecified dementia with behavioral disturbance: Secondary | ICD-10-CM | POA: Diagnosis not present

## 2017-09-11 DIAGNOSIS — R41841 Cognitive communication deficit: Secondary | ICD-10-CM | POA: Diagnosis not present

## 2017-09-12 DIAGNOSIS — F0391 Unspecified dementia with behavioral disturbance: Secondary | ICD-10-CM | POA: Diagnosis not present

## 2017-09-12 DIAGNOSIS — R41841 Cognitive communication deficit: Secondary | ICD-10-CM | POA: Diagnosis not present

## 2017-09-13 DIAGNOSIS — E785 Hyperlipidemia, unspecified: Secondary | ICD-10-CM | POA: Diagnosis not present

## 2017-09-13 DIAGNOSIS — R319 Hematuria, unspecified: Secondary | ICD-10-CM | POA: Diagnosis not present

## 2017-09-13 DIAGNOSIS — E119 Type 2 diabetes mellitus without complications: Secondary | ICD-10-CM | POA: Diagnosis not present

## 2017-09-13 DIAGNOSIS — N390 Urinary tract infection, site not specified: Secondary | ICD-10-CM | POA: Diagnosis not present

## 2017-09-13 DIAGNOSIS — Z79899 Other long term (current) drug therapy: Secondary | ICD-10-CM | POA: Diagnosis not present

## 2017-09-13 LAB — HEMOGLOBIN A1C: Hemoglobin A1C: 7.6

## 2017-09-13 LAB — LIPID PANEL
CHOLESTEROL: 183 (ref 0–200)
HDL: 44 (ref 35–70)
LDL Cholesterol: 100
Triglycerides: 194 — AB (ref 40–160)

## 2017-09-15 DIAGNOSIS — F0391 Unspecified dementia with behavioral disturbance: Secondary | ICD-10-CM | POA: Diagnosis not present

## 2017-09-15 DIAGNOSIS — R41841 Cognitive communication deficit: Secondary | ICD-10-CM | POA: Diagnosis not present

## 2017-09-16 DIAGNOSIS — F0391 Unspecified dementia with behavioral disturbance: Secondary | ICD-10-CM | POA: Diagnosis not present

## 2017-09-16 DIAGNOSIS — R41841 Cognitive communication deficit: Secondary | ICD-10-CM | POA: Diagnosis not present

## 2017-09-17 DIAGNOSIS — R41841 Cognitive communication deficit: Secondary | ICD-10-CM | POA: Diagnosis not present

## 2017-09-17 DIAGNOSIS — F0391 Unspecified dementia with behavioral disturbance: Secondary | ICD-10-CM | POA: Diagnosis not present

## 2017-09-18 ENCOUNTER — Non-Acute Institutional Stay (SKILLED_NURSING_FACILITY): Payer: Medicare Other | Admitting: Adult Health

## 2017-09-18 ENCOUNTER — Encounter: Payer: Self-pay | Admitting: Adult Health

## 2017-09-18 DIAGNOSIS — E119 Type 2 diabetes mellitus without complications: Secondary | ICD-10-CM | POA: Diagnosis not present

## 2017-09-18 DIAGNOSIS — E1169 Type 2 diabetes mellitus with other specified complication: Secondary | ICD-10-CM | POA: Diagnosis not present

## 2017-09-18 DIAGNOSIS — Z79899 Other long term (current) drug therapy: Secondary | ICD-10-CM | POA: Diagnosis not present

## 2017-09-18 DIAGNOSIS — R41841 Cognitive communication deficit: Secondary | ICD-10-CM | POA: Diagnosis not present

## 2017-09-18 DIAGNOSIS — Z794 Long term (current) use of insulin: Secondary | ICD-10-CM | POA: Diagnosis not present

## 2017-09-18 DIAGNOSIS — E785 Hyperlipidemia, unspecified: Secondary | ICD-10-CM | POA: Diagnosis not present

## 2017-09-18 DIAGNOSIS — E118 Type 2 diabetes mellitus with unspecified complications: Secondary | ICD-10-CM | POA: Diagnosis not present

## 2017-09-18 DIAGNOSIS — I10 Essential (primary) hypertension: Secondary | ICD-10-CM | POA: Diagnosis not present

## 2017-09-18 DIAGNOSIS — F0391 Unspecified dementia with behavioral disturbance: Secondary | ICD-10-CM | POA: Diagnosis not present

## 2017-09-18 NOTE — Progress Notes (Signed)
Location:   [facility] Room Number: 225 B Place of Service:  SNF (31)   CODE STATUS: Full Code (Most form updated 05/02/17)  Allergies  Allergen Reactions  . Ace Inhibitors     Unknown reaction per MAR   . Ether Nausea And Vomiting    Chief Complaint  Patient presents with  . Acute Visit: diabetes management     HPI:  She is being seen for the management of her diabetes. Her cbg readings are mainly from 150-200 range. There are no reports of hypoglycemia present. There are no reports of missed doses of insulin. She denies any excessive hunger; no excessive thirst. There are reports of excessive urination. There are no nursing concerns at this time.   Past Medical History:  Diagnosis Date  . Allergic rhinitis   . CATARACT 06/06/2006   Qualifier: Diagnosis of  By: Genene Churn MD, Janett Billow    . Depressive disorder   . Diabetes mellitus type 2 in obese ([place])   . Essential hypertension, benign 02/24/2015  . Fungal dermatitis   . Hyperlipidemia   . Macular degeneration   . Mild cognitive impairment   . Narcissism ([place])   . Normocytic anemia   . Phobia   . Schizophrenia, paranoid ([place])   . Stress incontinence, female     Past Surgical History:  Procedure Laterality Date  . ABDOMINAL HYSTERECTOMY     due to fibroids  . Avastin Ingection Right 03/26/2017   In Right eye  . CATARACT EXTRACTION  6 16 2006  . CRYOTHERAPY  2 15 2006   facial AK  . hyperplastic   4 20 2006   AK shave biopsy  . intraocular injection  [id number]  . TOTAL ABDOMINAL HYSTERECTOMY W/ BILATERAL SALPINGOOPHORECTOMY     due to endometriosis  non malignant    Social History   Socioeconomic History  . Marital status: Divorced    Spouse name: Not on file  . Number of children: Not on file  . Years of education: Not on file  . Highest education level: Not on file  Occupational History  . Not on file  Social Needs  . Financial resource strain: Not on file  . Food insecurity:   Worry: Not on file    Inability: Not on file  . Transportation needs:    Medical: Not on file    Non-medical: Not on file  Tobacco Use  . Smoking status: Never Smoker  . Smokeless tobacco: Never Used  Substance and Sexual Activity  . Alcohol use: No  . Drug use: No  . Sexual activity: Not on file  Lifestyle  . Physical activity:    Days per week: Not on file    Minutes per session: Not on file  . Stress: Not on file  Relationships  . Social connections:    Talks on phone: Not on file    Gets together: Not on file    Attends religious service: Not on file    Active member of club or organization: Not on file    Attends meetings of clubs or organizations: Not on file    Relationship status: Not on file  . Intimate partner violence:    Fear of current or ex partner: Not on file    Emotionally abused: Not on file    Physically abused: Not on file    Forced sexual activity: Not on file  Other Topics Concern  . Not on file  Social History Narrative  .  Not on file   Family History  Problem Relation Age of Onset  . Heart disease Mother   . Heart disease Father   . Stroke Father   . Cancer Cousin        liver and colon  . Stroke Sister       VITAL SIGNS BP 127/62   Pulse 76   Temp 98 F (36.7 C)   Resp 20   Ht 4\' 9"  (1.448 m)   Wt 155 lb (70.3 kg)   SpO2 96%   BMI 33.54 kg/m   Outpatient Encounter Medications as of 09/18/2017  Medication Sig  . acetaminophen (TYLENOL) 325 MG tablet Take 650 mg by mouth every 6 (six) hours as needed for mild pain, moderate pain or fever.   Marland Kitchen aspirin EC 325 MG tablet Take 1 tablet (325 mg total) by mouth daily.  Marland Kitchen atorvastatin (LIPITOR) 20 MG tablet Take 1 tablet (20 mg total) by mouth at bedtime.  . benztropine (COGENTIN) 1 MG tablet Take 1 mg by mouth at bedtime.  . Calcium Carbonate-Vitamin D (CALCIUM 600-D) 600-400 MG-UNIT per tablet Take 1 tablet by mouth at bedtime.   . cholecalciferol (VITAMIN D) 1000 UNITS tablet Take  1,000 Units by mouth at bedtime.   . cyanocobalamin 1000 MCG tablet Take 1,000 mcg by mouth daily with breakfast.   . docusate sodium (COLACE) 100 MG capsule Take 100 mg by mouth daily.  Marland Kitchen donepezil (ARICEPT) 5 MG tablet Take 5 mg by mouth at bedtime.   Marland Kitchen escitalopram (LEXAPRO) 20 MG tablet Take 20 mg by mouth daily with breakfast.   . Hypromellose 0.4 % SOLN Place 1 drop into both eyes 3 (three) times daily.  . insulin aspart (NOVOLOG) 100 UNIT/ML injection Inject 8 Units into the skin 3 (three) times daily after meals. Notify MD if BS is less than 70 or greater than 450. Hold Insulin if BS is less than 70  . Insulin Detemir (LEVEMIR FLEXPEN) 100 UNIT/ML Pen Inject 14 Units into the skin at bedtime.   Marland Kitchen ipratropium (ATROVENT) 0.03 % nasal spray Place 2 sprays into both nostrils at bedtime. And at bedtime   . ipratropium (ATROVENT) 0.03 % nasal spray Place 2 sprays into both nostrils 3 (three) times daily before meals.  Marland Kitchen ipratropium-albuterol (DUONEB) 0.5-2.5 (3) MG/3ML SOLN Take 3 mLs by nebulization every 6 (six) hours as needed (for shortness of breath).   . lip balm (CARMEX) ointment 1 application. Apply to lips 4 times daily for cold sore  . loratadine (CLARITIN) 10 MG tablet Take 10 mg by mouth daily with breakfast.   . Memantine HCl ER (NAMENDA XR) 28 MG CP24 Take 28 mg by mouth daily with breakfast. For dementia  . Multiple Vitamin (MULTIVITAMIN) tablet Take 1 tablet by mouth daily.  . Nutritional Supplements (NUTRITIONAL SUPPLEMENT PO) Regular Diet - Pureed texture, Nectar Thickened fluids consistency.  Large portions  . Nutritional Supplements (NUTRITIONAL SUPPLEMENT PO) Frozen Nutritional treat two times daily for weight support  . OLANZapine (ZYPREXA) 5 MG tablet Take 5 mg by mouth at bedtime.  Marland Kitchen omeprazole (PRILOSEC) 20 MG capsule Take 20 mg by mouth daily at 6 (six) AM.  . white petrolatum (VASELINE) GEL Apply to left forehead every shift  . losartan (COZAAR) 50 MG tablet Take 50  mg by mouth daily with breakfast.   . Lotion Base LOTN Apply 1 application topically 2 (two) times daily.   No facility-administered encounter medications on file as of 09/18/2017.  SIGNIFICANT DIAGNOSTIC EXAMS   PREVIOUS  12-30-14: right eye intraocular injection     03-12-16: EEG: This is a mildly abnormal EEG due to the presence of  moderate generalized slowing which is indicative of bihemispheric dysfunction which is a nonspecific finding seen in a variety of degenerative, hypoxic, ischemic, toxic, metabolic etiologies. No definite epileptiform activity is noted  12-03-16: chest x-ray: no acute cardiopulmonary disease  01-10-17: chest x-ray: no acute cardiopulmonary disease  04-02-17: ct of head:  1. No acute intracranial pathology seen on CT. 2. Mild to moderate cortical volume loss and scattered small vessel ischemic microangiopathy. 3. Partial opacification of the mastoid air cells bilaterally.  04-02-17: ct angio of head and neck: Minor extracranial and intracranial atheromatous change, not unexpected for age, and stable from 2017. No large vessel occlusion, dissection, or other significant vascular finding. Atrophy and small vessel disease without acute intracranial process.  04-02-17: MRI of brain: Atrophy and small vessel disease. No acute intracranial findings.  NO NEW EXAMS      LABS REVIEWED: PREVIOUS    11-02-16: glucose 198; bun 17.0; creat 0.89; k+ 4.7; na++ 137; liver normal albumin 4.0 hgb a1c 6.9; chol 162; ldl 85; trig 157; hdl 45 03-29-17: chol 162; ldl 75 trig 214; hdl 44; hgb a1c 9.4 04-02-17: wbc 8.5; hgb 14.2; hct 44.1; mcv 86.0; plt 213; glucose 175; bun 20; creat 1.10; k+ 4.2; na++ 138; ca 9.0; liver normal albumin 3.1; ammonia 14 04-04-17: wbc 8.7; hgb 11.1; hct 34.0; mcv 85.4; plt 188; glucose 97; bun 16; creat 0.87; k+ 3.7; na++137; ca 8.1 05-10-17: wbc 7.3; hgb 12.7; hct 39.7; mcv 85.3; plt 189; glucose 210; bun 24.2; creat 0.92; k+ 4.4; na++ 140;  ca 9.2   TODAY:   09-13-17: chol 183; ldl 100; trig 194; hdl 44; hgb a1c 7.6   Review of Systems  Constitutional: Negative for malaise/fatigue.  Respiratory: Negative for cough and shortness of breath.   Cardiovascular: Negative for chest pain, palpitations and leg swelling.  Gastrointestinal: Negative for abdominal pain, constipation and heartburn.  Musculoskeletal: Negative for back pain, joint pain and myalgias.  Skin: Negative.   Neurological: Negative for dizziness.  Psychiatric/Behavioral: The patient is not nervous/anxious.      Physical Exam  Constitutional: She appears well-developed and well-nourished. No distress.  Neck: No thyromegaly present.  Cardiovascular: Normal rate, regular rhythm and intact distal pulses.  Murmur heard. 1/6  Pulmonary/Chest: Effort normal and breath sounds normal. No respiratory distress.  Abdominal: Soft. Bowel sounds are normal. She exhibits no distension. There is no tenderness.  Musculoskeletal: Normal range of motion. She exhibits no edema.  Lymphadenopathy:    She has no cervical adenopathy.  Neurological: She is alert.  Skin: Skin is warm and dry. She is not diaphoretic.  Psychiatric: She has a normal mood and affect.     ASSESSMENT/ PLAN:  TODAY:   1. Hyperlipidemia associated with type 2 diabetes mellitus: stable  ldl 100; trig 194  will continue lipitor 20 mg daily   2. Controlled diabetes type II without complication with long term insulin use:  cbgs are elevated  hgb a1c 7.9 (previous 6.9)  Will continue novolog  8 units with meals  Will change levemir to 17 units nightly will monitor   Is on  statin and arb, asa     MD is aware of resident's narcotic use and is in agreement with current plan of care. We will attempt to wean resident as apropriate   Ok Edwards NP  [place] [phone] Monday through Friday 8am- 5pm  After hours call [phone]

## 2017-09-19 DIAGNOSIS — F0391 Unspecified dementia with behavioral disturbance: Secondary | ICD-10-CM | POA: Diagnosis not present

## 2017-09-19 DIAGNOSIS — F251 Schizoaffective disorder, depressive type: Secondary | ICD-10-CM | POA: Diagnosis not present

## 2017-09-19 DIAGNOSIS — F0151 Vascular dementia with behavioral disturbance: Secondary | ICD-10-CM | POA: Diagnosis not present

## 2017-09-19 DIAGNOSIS — R41841 Cognitive communication deficit: Secondary | ICD-10-CM | POA: Diagnosis not present

## 2017-09-20 ENCOUNTER — Encounter (INDEPENDENT_AMBULATORY_CARE_PROVIDER_SITE_OTHER): Payer: Medicare Other | Admitting: Ophthalmology

## 2017-09-22 DIAGNOSIS — R41841 Cognitive communication deficit: Secondary | ICD-10-CM | POA: Diagnosis not present

## 2017-09-22 DIAGNOSIS — F0391 Unspecified dementia with behavioral disturbance: Secondary | ICD-10-CM | POA: Diagnosis not present

## 2017-09-23 ENCOUNTER — Encounter (INDEPENDENT_AMBULATORY_CARE_PROVIDER_SITE_OTHER): Payer: Medicare Other | Admitting: Ophthalmology

## 2017-09-23 ENCOUNTER — Encounter (INDEPENDENT_AMBULATORY_CARE_PROVIDER_SITE_OTHER): Payer: Self-pay

## 2017-09-23 DIAGNOSIS — F0391 Unspecified dementia with behavioral disturbance: Secondary | ICD-10-CM | POA: Diagnosis not present

## 2017-09-23 DIAGNOSIS — R41841 Cognitive communication deficit: Secondary | ICD-10-CM | POA: Diagnosis not present

## 2017-09-24 ENCOUNTER — Encounter (INDEPENDENT_AMBULATORY_CARE_PROVIDER_SITE_OTHER): Payer: Medicare Other | Admitting: Ophthalmology

## 2017-09-24 DIAGNOSIS — F0391 Unspecified dementia with behavioral disturbance: Secondary | ICD-10-CM | POA: Diagnosis not present

## 2017-09-24 DIAGNOSIS — R41841 Cognitive communication deficit: Secondary | ICD-10-CM | POA: Diagnosis not present

## 2017-09-25 DIAGNOSIS — R41841 Cognitive communication deficit: Secondary | ICD-10-CM | POA: Diagnosis not present

## 2017-09-25 DIAGNOSIS — F0391 Unspecified dementia with behavioral disturbance: Secondary | ICD-10-CM | POA: Diagnosis not present

## 2017-09-26 DIAGNOSIS — R41841 Cognitive communication deficit: Secondary | ICD-10-CM | POA: Diagnosis not present

## 2017-09-26 DIAGNOSIS — F0391 Unspecified dementia with behavioral disturbance: Secondary | ICD-10-CM | POA: Diagnosis not present

## 2017-09-29 DIAGNOSIS — F0391 Unspecified dementia with behavioral disturbance: Secondary | ICD-10-CM | POA: Diagnosis not present

## 2017-09-29 DIAGNOSIS — R41841 Cognitive communication deficit: Secondary | ICD-10-CM | POA: Diagnosis not present

## 2017-09-30 ENCOUNTER — Encounter: Payer: Self-pay | Admitting: Adult Health

## 2017-09-30 ENCOUNTER — Non-Acute Institutional Stay (SKILLED_NURSING_FACILITY): Payer: Medicare Other | Admitting: Adult Health

## 2017-09-30 DIAGNOSIS — E119 Type 2 diabetes mellitus without complications: Secondary | ICD-10-CM

## 2017-09-30 DIAGNOSIS — E1169 Type 2 diabetes mellitus with other specified complication: Secondary | ICD-10-CM | POA: Diagnosis not present

## 2017-09-30 DIAGNOSIS — E785 Hyperlipidemia, unspecified: Secondary | ICD-10-CM | POA: Diagnosis not present

## 2017-09-30 DIAGNOSIS — Z794 Long term (current) use of insulin: Secondary | ICD-10-CM

## 2017-09-30 NOTE — Progress Notes (Signed)
Location:   [facility] Room Number: 225 B Place of Service:  SNF (31)   CODE STATUS: Full Code (Most form updated 05/02/17)  Allergies  Allergen Reactions  . Ace Inhibitors     Unknown reaction per MAR   . Ether Nausea And Vomiting    Chief Complaint  Patient presents with  . Acute Visit    diabetes     HPI:  Her cbg readings over the past 4 days have been doing well. There are no reports of hypo or hyperglycemia present. There are no reports missed medications. She denies any excessive hunger or thirst. There are no nursing concerns at this time.   Past Medical History:  Diagnosis Date  . Allergic rhinitis   . CATARACT 06/06/2006   Qualifier: Diagnosis of  By: Genene Churn MD, Janett Billow    . Depressive disorder   . Diabetes mellitus type 2 in obese ([place])   . Essential hypertension, benign 02/24/2015  . Fungal dermatitis   . Hyperlipidemia   . Macular degeneration   . Mild cognitive impairment   . Narcissism ([place])   . Normocytic anemia   . Phobia   . Schizophrenia, paranoid ([place])   . Stress incontinence, female     Past Surgical History:  Procedure Laterality Date  . ABDOMINAL HYSTERECTOMY     due to fibroids  . Avastin Ingection Right 03/26/2017   In Right eye  . CATARACT EXTRACTION  6 16 2006  . CRYOTHERAPY  2 15 2006   facial AK  . hyperplastic   4 20 2006   AK shave biopsy  . intraocular injection  [id number]  . TOTAL ABDOMINAL HYSTERECTOMY W/ BILATERAL SALPINGOOPHORECTOMY     due to endometriosis  non malignant    Social History   Socioeconomic History  . Marital status: Divorced    Spouse name: Not on file  . Number of children: Not on file  . Years of education: Not on file  . Highest education level: Not on file  Occupational History  . Not on file  Social Needs  . Financial resource strain: Not on file  . Food insecurity:    Worry: Not on file    Inability: Not on file  . Transportation needs:    Medical: Not on file   Non-medical: Not on file  Tobacco Use  . Smoking status: Never Smoker  . Smokeless tobacco: Never Used  Substance and Sexual Activity  . Alcohol use: No  . Drug use: No  . Sexual activity: Not on file  Lifestyle  . Physical activity:    Days per week: Not on file    Minutes per session: Not on file  . Stress: Not on file  Relationships  . Social connections:    Talks on phone: Not on file    Gets together: Not on file    Attends religious service: Not on file    Active member of club or organization: Not on file    Attends meetings of clubs or organizations: Not on file    Relationship status: Not on file  . Intimate partner violence:    Fear of current or ex partner: Not on file    Emotionally abused: Not on file    Physically abused: Not on file    Forced sexual activity: Not on file  Other Topics Concern  . Not on file  Social History Narrative  . Not on file   Family History  Problem Relation Age of  Onset  . Heart disease Mother   . Heart disease Father   . Stroke Father   . Cancer Cousin        liver and colon  . Stroke Sister       VITAL SIGNS BP 118/68   Pulse 70   Temp 97.7 F (36.5 C)   Resp 18   Ht 4\' 9"  (1.448 m)   Wt 157 lb 14.4 oz (71.6 kg)   SpO2 95%   BMI 34.17 kg/m   Outpatient Encounter Medications as of 09/30/2017  Medication Sig  . acetaminophen (TYLENOL) 325 MG tablet Take 650 mg by mouth every 6 (six) hours as needed for mild pain, moderate pain or fever.   Marland Kitchen aspirin EC 325 MG tablet Take 1 tablet (325 mg total) by mouth daily.  Marland Kitchen atorvastatin (LIPITOR) 20 MG tablet Take 1 tablet (20 mg total) by mouth at bedtime.  . benztropine (COGENTIN) 1 MG tablet Take 1 mg by mouth at bedtime.  . Calcium Carbonate-Vitamin D (CALCIUM 600-D) 600-400 MG-UNIT per tablet Take 1 tablet by mouth at bedtime.   . cholecalciferol (VITAMIN D) 1000 UNITS tablet Take 1,000 Units by mouth at bedtime.   . cyanocobalamin 1000 MCG tablet Take 1,000 mcg by mouth  daily with breakfast.   . docusate sodium (COLACE) 100 MG capsule Take 100 mg by mouth daily.  Marland Kitchen donepezil (ARICEPT) 5 MG tablet Take 5 mg by mouth at bedtime.   Marland Kitchen escitalopram (LEXAPRO) 20 MG tablet Take 20 mg by mouth daily with breakfast.   . Hypromellose 0.4 % SOLN Place 1 drop into both eyes 3 (three) times daily.  . insulin aspart (NOVOLOG) 100 UNIT/ML injection Inject 8 Units into the skin 3 (three) times daily after meals. Notify MD if BS is less than 70 or greater than 450. Hold Insulin if BS is less than 70  . Insulin Detemir (LEVEMIR FLEXPEN) 100 UNIT/ML Pen Inject 17 Units into the skin at bedtime.   . lip balm (CARMEX) ointment 1 application. Apply to lips 4 times daily for cold sore  . loratadine (CLARITIN) 10 MG tablet Take 10 mg by mouth daily with breakfast.   . losartan (COZAAR) 50 MG tablet Take 50 mg by mouth daily with breakfast.   . Lotion Base LOTN Apply 1 application topically 2 (two) times daily.  . Memantine HCl ER (NAMENDA XR) 28 MG CP24 Take 28 mg by mouth daily with breakfast. For dementia  . Multiple Vitamin (MULTIVITAMIN) tablet Take 1 tablet by mouth daily.  . Nutritional Supplements (NUTRITIONAL SUPPLEMENT PO) Regular Diet - Pureed texture, Nectar Thickened fluids consistency.  Large portions  . Nutritional Supplements (NUTRITIONAL SUPPLEMENT PO) Frozen Nutritional treat two times daily for weight support  . OLANZapine (ZYPREXA) 5 MG tablet Take 5 mg by mouth at bedtime.  Marland Kitchen omeprazole (PRILOSEC) 20 MG capsule Take 20 mg by mouth daily at 6 (six) AM.  . white petrolatum (VASELINE) GEL Apply to left forehead every shift  . [DISCONTINUED] ipratropium (ATROVENT) 0.03 % nasal spray Place 2 sprays into both nostrils at bedtime. And at bedtime   . [DISCONTINUED] ipratropium (ATROVENT) 0.03 % nasal spray Place 2 sprays into both nostrils 3 (three) times daily before meals.  . [DISCONTINUED] ipratropium-albuterol (DUONEB) 0.5-2.5 (3) MG/3ML SOLN Take 3 mLs by nebulization  every 6 (six) hours as needed (for shortness of breath).    No facility-administered encounter medications on file as of 09/30/2017.      SIGNIFICANT DIAGNOSTIC EXAMS  PREVIOUS  12-30-14: right eye intraocular injection     03-12-16: EEG: This is a mildly abnormal EEG due to the presence of  moderate generalized slowing which is indicative of bihemispheric dysfunction which is a nonspecific finding seen in a variety of degenerative, hypoxic, ischemic, toxic, metabolic etiologies. No definite epileptiform activity is noted  12-03-16: chest x-ray: no acute cardiopulmonary disease  01-10-17: chest x-ray: no acute cardiopulmonary disease  04-02-17: ct of head:  1. No acute intracranial pathology seen on CT. 2. Mild to moderate cortical volume loss and scattered small vessel ischemic microangiopathy. 3. Partial opacification of the mastoid air cells bilaterally.  04-02-17: ct angio of head and neck: Minor extracranial and intracranial atheromatous change, not unexpected for age, and stable from 2017. No large vessel occlusion, dissection, or other significant vascular finding. Atrophy and small vessel disease without acute intracranial process.  04-02-17: MRI of brain: Atrophy and small vessel disease. No acute intracranial findings.  NO NEW EXAMS      LABS REVIEWED: PREVIOUS    11-02-16: glucose 198; bun 17.0; creat 0.89; k+ 4.7; na++ 137; liver normal albumin 4.0 hgb a1c 6.9; chol 162; ldl 85; trig 157; hdl 45 03-29-17: chol 162; ldl 75 trig 214; hdl 44; hgb a1c 9.4 04-02-17: wbc 8.5; hgb 14.2; hct 44.1; mcv 86.0; plt 213; glucose 175; bun 20; creat 1.10; k+ 4.2; na++ 138; ca 9.0; liver normal albumin 3.1; ammonia 14 04-04-17: wbc 8.7; hgb 11.1; hct 34.0; mcv 85.4; plt 188; glucose 97; bun 16; creat 0.87; k+ 3.7; na++137; ca 8.1 05-10-17: wbc 7.3; hgb 12.7; hct 39.7; mcv 85.3; plt 189; glucose 210; bun 24.2; creat 0.92; k+ 4.4; na++ 140; ca 9.2  09-13-17: chol 183; ldl 100; trig 194; hdl  44; hgb a1c 7.6   NO NEW LABS.    Review of Systems  Constitutional: Negative for malaise/fatigue.  Respiratory: Negative for cough and shortness of breath.   Cardiovascular: Negative for chest pain, palpitations and leg swelling.  Gastrointestinal: Negative for abdominal pain, constipation and heartburn.  Musculoskeletal: Negative for back pain, joint pain and myalgias.  Skin: Negative.   Neurological: Negative for dizziness.  Psychiatric/Behavioral: The patient is not nervous/anxious.     Physical Exam  Constitutional: She appears well-developed and well-nourished. No distress.  Neck: No thyromegaly present.  Cardiovascular: Normal rate, regular rhythm and intact distal pulses.  Murmur heard. 1/6  Pulmonary/Chest: Effort normal and breath sounds normal. No respiratory distress.  Abdominal: Soft. Bowel sounds are normal. She exhibits no distension. There is no tenderness.  Musculoskeletal: Normal range of motion. She exhibits no edema.  Lymphadenopathy:    She has no cervical adenopathy.  Neurological: She is alert.  Skin: Skin is warm and dry. She is not diaphoretic.  Psychiatric: She has a normal mood and affect.      ASSESSMENT/ PLAN:  TODAY:   1. Hyperlipidemia associated with type 2 diabetes mellitus: stable  ldl 100; trig 194  will continue lipitor 20 mg daily   2. Controlled diabetes type II without complication with long term insulin use:  cbgs are elevated  hgb a1c 7.96(previous 6.9)  Will continue novolog  8 units with meals levemir  17 units nightly will monitor   Is on  statin and arb, asa    MD is aware of resident's narcotic use and is in agreement with current plan of care. We will attempt to wean resident as apropriate   Ok Edwards NP [facility]  Contact [phone] Monday through  Friday 8am- 5pm  After hours call [phone]

## 2017-10-01 DIAGNOSIS — F0391 Unspecified dementia with behavioral disturbance: Secondary | ICD-10-CM | POA: Diagnosis not present

## 2017-10-01 DIAGNOSIS — R41841 Cognitive communication deficit: Secondary | ICD-10-CM | POA: Diagnosis not present

## 2017-10-02 DIAGNOSIS — F0391 Unspecified dementia with behavioral disturbance: Secondary | ICD-10-CM | POA: Diagnosis not present

## 2017-10-02 DIAGNOSIS — R41841 Cognitive communication deficit: Secondary | ICD-10-CM | POA: Diagnosis not present

## 2017-10-03 DIAGNOSIS — F251 Schizoaffective disorder, depressive type: Secondary | ICD-10-CM | POA: Diagnosis not present

## 2017-10-03 DIAGNOSIS — R41841 Cognitive communication deficit: Secondary | ICD-10-CM | POA: Diagnosis not present

## 2017-10-03 DIAGNOSIS — F0391 Unspecified dementia with behavioral disturbance: Secondary | ICD-10-CM | POA: Diagnosis not present

## 2017-10-03 DIAGNOSIS — F0151 Vascular dementia with behavioral disturbance: Secondary | ICD-10-CM | POA: Diagnosis not present

## 2017-10-04 ENCOUNTER — Encounter: Payer: Self-pay | Admitting: Adult Health

## 2017-10-04 ENCOUNTER — Non-Acute Institutional Stay (SKILLED_NURSING_FACILITY): Payer: Medicare Other | Admitting: Adult Health

## 2017-10-04 DIAGNOSIS — F209 Schizophrenia, unspecified: Secondary | ICD-10-CM

## 2017-10-04 DIAGNOSIS — J45998 Other asthma: Secondary | ICD-10-CM | POA: Diagnosis not present

## 2017-10-04 DIAGNOSIS — I10 Essential (primary) hypertension: Secondary | ICD-10-CM

## 2017-10-04 DIAGNOSIS — G459 Transient cerebral ischemic attack, unspecified: Secondary | ICD-10-CM | POA: Diagnosis not present

## 2017-10-04 NOTE — Progress Notes (Signed)
Location:   [facility] Room Number: 225 B Place of Service:  SNF (31)   CODE STATUS: Full Code (Most form updated 05/02/17)  Allergies  Allergen Reactions  . Ace Inhibitors     Unknown reaction per MAR   . Ether Nausea And Vomiting    Chief Complaint  Patient presents with  . Medical Management of Chronic Issues    Hypertension; allergic bronchitis; tia; schizophrenia     HPI:  She is a 84 year old long term resident of this facility being seen for the management of her chronic illnesses: hypertension; allergic bronchitis; tia; schizophrenia. She denies any cough or shortness of breath; she denies any anxiety or depression. There are no nursing concerns at this time.   Past Medical History:  Diagnosis Date  . Allergic rhinitis   . CATARACT 06/06/2006   Qualifier: Diagnosis of  By: Genene Churn MD, Janett Billow    . Depressive disorder   . Diabetes mellitus type 2 in obese ([place])   . Essential hypertension, benign 02/24/2015  . Fungal dermatitis   . Hyperlipidemia   . Macular degeneration   . Mild cognitive impairment   . Narcissism ([place])   . Normocytic anemia   . Phobia   . Schizophrenia, paranoid ([place])   . Stress incontinence, female     Past Surgical History:  Procedure Laterality Date  . ABDOMINAL HYSTERECTOMY     due to fibroids  . Avastin Ingection Right 03/26/2017   In Right eye  . CATARACT EXTRACTION  6 16 2006  . CRYOTHERAPY  2 15 2006   facial AK  . hyperplastic   4 20 2006   AK shave biopsy  . intraocular injection  [id number]  . TOTAL ABDOMINAL HYSTERECTOMY W/ BILATERAL SALPINGOOPHORECTOMY     due to endometriosis  non malignant    Social History   Socioeconomic History  . Marital status: Divorced    Spouse name: Not on file  . Number of children: Not on file  . Years of education: Not on file  . Highest education level: Not on file  Occupational History  . Not on file  Social Needs  . Financial resource strain: Not on file  . Food  insecurity:    Worry: Not on file    Inability: Not on file  . Transportation needs:    Medical: Not on file    Non-medical: Not on file  Tobacco Use  . Smoking status: Never Smoker  . Smokeless tobacco: Never Used  Substance and Sexual Activity  . Alcohol use: No  . Drug use: No  . Sexual activity: Not on file  Lifestyle  . Physical activity:    Days per week: Not on file    Minutes per session: Not on file  . Stress: Not on file  Relationships  . Social connections:    Talks on phone: Not on file    Gets together: Not on file    Attends religious service: Not on file    Active member of club or organization: Not on file    Attends meetings of clubs or organizations: Not on file    Relationship status: Not on file  . Intimate partner violence:    Fear of current or ex partner: Not on file    Emotionally abused: Not on file    Physically abused: Not on file    Forced sexual activity: Not on file  Other Topics Concern  . Not on file  Social History  Narrative  . Not on file   Family History  Problem Relation Age of Onset  . Heart disease Mother   . Heart disease Father   . Stroke Father   . Cancer Cousin        liver and colon  . Stroke Sister       VITAL SIGNS BP 120/70   Pulse 72   Temp 97.9 F (36.6 C)   Resp 18   Ht 4\' 9"  (1.448 m)   Wt 157 lb 14.4 oz (71.6 kg)   SpO2 96%   BMI 34.17 kg/m   Outpatient Encounter Medications as of 10/04/2017  Medication Sig  . acetaminophen (TYLENOL) 325 MG tablet Take 650 mg by mouth every 6 (six) hours as needed for mild pain, moderate pain or fever.   Marland Kitchen aspirin EC 325 MG tablet Take 1 tablet (325 mg total) by mouth daily.  Marland Kitchen atorvastatin (LIPITOR) 20 MG tablet Take 1 tablet (20 mg total) by mouth at bedtime.  . benztropine (COGENTIN) 1 MG tablet Take 1 mg by mouth at bedtime.  . Calcium Carbonate-Vitamin D (CALCIUM 600-D) 600-400 MG-UNIT per tablet Take 1 tablet by mouth at bedtime.   . cholecalciferol (VITAMIN D)  1000 UNITS tablet Take 1,000 Units by mouth at bedtime.   . cyanocobalamin 1000 MCG tablet Take 1,000 mcg by mouth daily with breakfast.   . docusate sodium (COLACE) 100 MG capsule Take 100 mg by mouth daily.  Marland Kitchen donepezil (ARICEPT) 5 MG tablet Take 5 mg by mouth at bedtime.   Marland Kitchen escitalopram (LEXAPRO) 20 MG tablet Take 20 mg by mouth daily with breakfast.   . Hypromellose 0.4 % SOLN Place 1 drop into both eyes 3 (three) times daily.  . insulin aspart (NOVOLOG) 100 UNIT/ML injection Inject 8 Units into the skin 3 (three) times daily after meals. Notify MD if BS is less than 70 or greater than 450. Hold Insulin if BS is less than 70  . Insulin Detemir (LEVEMIR FLEXPEN) 100 UNIT/ML Pen Inject 17 Units into the skin at bedtime.   . lip balm (CARMEX) ointment 1 application. Apply to lips 4 times daily for cold sore  . loratadine (CLARITIN) 10 MG tablet Take 10 mg by mouth daily with breakfast.   . losartan (COZAAR) 50 MG tablet Take 50 mg by mouth daily with breakfast.   . Lotion Base LOTN Apply 1 application topically 2 (two) times daily.  . Memantine HCl ER (NAMENDA XR) 28 MG CP24 Take 28 mg by mouth daily with breakfast. For dementia  . Multiple Vitamin (MULTIVITAMIN) tablet Take 1 tablet by mouth daily.  . Nutritional Supplements (NUTRITIONAL SUPPLEMENT PO) Regular Diet - Pureed texture, Nectar Thickened fluids consistency.  Large portions  . Nutritional Supplements (NUTRITIONAL SUPPLEMENT PO) Frozen Nutritional treat two times daily for weight support  . OLANZapine (ZYPREXA) 5 MG tablet Take 5 mg by mouth at bedtime.  Marland Kitchen omeprazole (PRILOSEC) 20 MG capsule Take 20 mg by mouth daily at 6 (six) AM.  . white petrolatum (VASELINE) GEL Apply to left forehead every shift   No facility-administered encounter medications on file as of 10/04/2017.      SIGNIFICANT DIAGNOSTIC EXAMS   PREVIOUS  12-30-14: right eye intraocular injection     03-12-16: EEG: This is a mildly abnormal EEG due to the  presence of  moderate generalized slowing which is indicative of bihemispheric dysfunction which is a nonspecific finding seen in a variety of degenerative, hypoxic, ischemic, toxic, metabolic etiologies. No  definite epileptiform activity is noted  12-03-16: chest x-ray: no acute cardiopulmonary disease  01-10-17: chest x-ray: no acute cardiopulmonary disease  04-02-17: ct of head:  1. No acute intracranial pathology seen on CT. 2. Mild to moderate cortical volume loss and scattered small vessel ischemic microangiopathy. 3. Partial opacification of the mastoid air cells bilaterally.  04-02-17: ct angio of head and neck: Minor extracranial and intracranial atheromatous change, not unexpected for age, and stable from 2017. No large vessel occlusion, dissection, or other significant vascular finding. Atrophy and small vessel disease without acute intracranial process.  04-02-17: MRI of brain: Atrophy and small vessel disease. No acute intracranial findings.  NO NEW EXAMS      LABS REVIEWED: PREVIOUS    11-02-16: glucose 198; bun 17.0; creat 0.89; k+ 4.7; na++ 137; liver normal albumin 4.0 hgb a1c 6.9; chol 162; ldl 85; trig 157; hdl 45 03-29-17: chol 162; ldl 75 trig 214; hdl 44; hgb a1c 9.4 04-02-17: wbc 8.5; hgb 14.2; hct 44.1; mcv 86.0; plt 213; glucose 175; bun 20; creat 1.10; k+ 4.2; na++ 138; ca 9.0; liver normal albumin 3.1; ammonia 14 04-04-17: wbc 8.7; hgb 11.1; hct 34.0; mcv 85.4; plt 188; glucose 97; bun 16; creat 0.87; k+ 3.7; na++137; ca 8.1 05-10-17: wbc 7.3; hgb 12.7; hct 39.7; mcv 85.3; plt 189; glucose 210; bun 24.2; creat 0.92; k+ 4.4; na++ 140; ca 9.2  09-13-17: chol 183; ldl 100; trig 194; hdl 44; hgb a1c 7.6   NO NEW LABS.    Review of Systems  Constitutional: Negative for malaise/fatigue.  Respiratory: Negative for cough and shortness of breath.   Cardiovascular: Negative for chest pain, palpitations and leg swelling.  Gastrointestinal: Negative for abdominal pain,  constipation and heartburn.  Musculoskeletal: Negative for back pain, joint pain and myalgias.  Skin: Negative.   Neurological: Negative for dizziness.  Psychiatric/Behavioral: The patient is not nervous/anxious.      Physical Exam  Constitutional: She appears well-developed and well-nourished. No distress.  Neck: No thyromegaly present.  Cardiovascular: Normal rate, regular rhythm and intact distal pulses.  Murmur heard. 1/6  Pulmonary/Chest: Effort normal and breath sounds normal. No respiratory distress.  Abdominal: Soft. Bowel sounds are normal. She exhibits no distension. There is no tenderness.  Musculoskeletal: Normal range of motion. She exhibits no edema.  Using wheelchair   Lymphadenopathy:    She has no cervical adenopathy.  Neurological: She is alert.  Skin: Skin is warm and dry. She is not diaphoretic.  Psychiatric: She has a normal mood and affect.      ASSESSMENT/ PLAN:  TODAY:   1. Schizophrenia: is stable continue  zyprexa 5  mg nightly and will monitor   Is followed by psych services.  Will continue cogentin 1 mg nightly for tardive dyskinesia and will monitor   2. Chronic allergic bronchitis: will continue claritin 10 mg daily  3. TIA: is presently stable; will continue asa 325 mg daily   4. Essential hypertension benign: b/p 120/70  is stable : will continue cozaar 50 mg daily   PREVIOUS    5. Dementia without behavior disturbance; is without change will continue aricept 5 mg daily and namenda xr 28 mg daily will not make changes will monitor   Her current weight is 157 pounds and is currently stable.   6. Tardive dyskinesia: stable  she does not have a resting tremor or lip tremor . Will continue cogentin 1 mg nightly   7. Hyperlipidemia associated with type 2 diabetes mellitus: stable  ldl 75; trig 214  will continue lipitor 20 mg daily   8. Controlled diabetes type II without complication with long term insulin use:  stable  hgb a1c 6.9 (previous  7.2)  Will continue levemir 17  units;  novolog  8 units with meals   Is on  statin and arb, asa   9. Dysphagia, oropharyngeal phase:stable she is on nectar thick liquids; no signs of aspiration present   10. Gerd without esophagitis: stable will continue prilosec 20  mg daily   11. Chronic constipation stable : will continue colace daily    12. Depressive dissorder: stable  will continue lexapro 20 mg daily will monitor     MD is aware of resident's narcotic use and is in agreement with current plan of care. We will attempt to wean resident as apropriate   Ok Edwards NP [facility]  Contact [phone] Monday through Friday 8am- 5pm  After hours call [phone]

## 2017-10-14 ENCOUNTER — Encounter (INDEPENDENT_AMBULATORY_CARE_PROVIDER_SITE_OTHER): Payer: Medicare Other | Admitting: Ophthalmology

## 2017-10-14 DIAGNOSIS — H43813 Vitreous degeneration, bilateral: Secondary | ICD-10-CM

## 2017-10-14 DIAGNOSIS — H35033 Hypertensive retinopathy, bilateral: Secondary | ICD-10-CM | POA: Diagnosis not present

## 2017-10-14 DIAGNOSIS — I10 Essential (primary) hypertension: Secondary | ICD-10-CM

## 2017-10-14 DIAGNOSIS — H353231 Exudative age-related macular degeneration, bilateral, with active choroidal neovascularization: Secondary | ICD-10-CM

## 2017-10-24 DIAGNOSIS — F251 Schizoaffective disorder, depressive type: Secondary | ICD-10-CM | POA: Diagnosis not present

## 2017-10-24 DIAGNOSIS — F0151 Vascular dementia with behavioral disturbance: Secondary | ICD-10-CM | POA: Diagnosis not present

## 2017-11-06 ENCOUNTER — Non-Acute Institutional Stay (SKILLED_NURSING_FACILITY): Payer: Medicare Other | Admitting: Adult Health

## 2017-11-06 ENCOUNTER — Encounter: Payer: Self-pay | Admitting: Adult Health

## 2017-11-06 DIAGNOSIS — G2401 Drug induced subacute dyskinesia: Secondary | ICD-10-CM

## 2017-11-06 DIAGNOSIS — F0150 Vascular dementia without behavioral disturbance: Secondary | ICD-10-CM | POA: Diagnosis not present

## 2017-11-06 DIAGNOSIS — E785 Hyperlipidemia, unspecified: Secondary | ICD-10-CM | POA: Diagnosis not present

## 2017-11-06 DIAGNOSIS — E1169 Type 2 diabetes mellitus with other specified complication: Secondary | ICD-10-CM | POA: Diagnosis not present

## 2017-11-06 DIAGNOSIS — E119 Type 2 diabetes mellitus without complications: Secondary | ICD-10-CM

## 2017-11-06 DIAGNOSIS — Z794 Long term (current) use of insulin: Secondary | ICD-10-CM | POA: Diagnosis not present

## 2017-11-06 NOTE — Progress Notes (Signed)
Location:   [facility] Room Number: 225 B Place of Service:  SNF (31)   CODE STATUS: Full Code (Most form updated 05/02/17)  Allergies  Allergen Reactions  . Ace Inhibitors     Unknown reaction per MAR   . Ether Nausea And Vomiting    Chief Complaint  Patient presents with  . Medical Management of Chronic Issues    Hyperlipidemia; dementia; tardive dyskinesia; diabetes.     HPI:  She is a 84 year old long term resident of this facility being seen for the management of her chronic illnesses; hyperlipidemia; dementia; tardive dyskinesia; diabetes. She denies any excessive hunger of thirst; no anxiety; no uncontrolled pain. There are no nursing concerns at this time.   Past Medical History:  Diagnosis Date  . Allergic rhinitis   . CATARACT 06/06/2006   Qualifier: Diagnosis of  By: Genene Churn MD, Janett Billow    . Depressive disorder   . Diabetes mellitus type 2 in obese ([place])   . Essential hypertension, benign 02/24/2015  . Fungal dermatitis   . Hyperlipidemia   . Macular degeneration   . Mild cognitive impairment   . Narcissism ([place])   . Normocytic anemia   . Phobia   . Schizophrenia, paranoid ([place])   . Stress incontinence, female     Past Surgical History:  Procedure Laterality Date  . ABDOMINAL HYSTERECTOMY     due to fibroids  . Avastin Ingection Right 03/26/2017   In Right eye  . CATARACT EXTRACTION  6 16 2006  . CRYOTHERAPY  2 15 2006   facial AK  . hyperplastic   4 20 2006   AK shave biopsy  . intraocular injection  [id number]  . TOTAL ABDOMINAL HYSTERECTOMY W/ BILATERAL SALPINGOOPHORECTOMY     due to endometriosis  non malignant    Social History   Socioeconomic History  . Marital status: Divorced    Spouse name: Not on file  . Number of children: Not on file  . Years of education: Not on file  . Highest education level: Not on file  Occupational History  . Not on file  Social Needs  . Financial resource strain: Not on file  . Food  insecurity:    Worry: Not on file    Inability: Not on file  . Transportation needs:    Medical: Not on file    Non-medical: Not on file  Tobacco Use  . Smoking status: Never Smoker  . Smokeless tobacco: Never Used  Substance and Sexual Activity  . Alcohol use: No  . Drug use: No  . Sexual activity: Not on file  Lifestyle  . Physical activity:    Days per week: Not on file    Minutes per session: Not on file  . Stress: Not on file  Relationships  . Social connections:    Talks on phone: Not on file    Gets together: Not on file    Attends religious service: Not on file    Active member of club or organization: Not on file    Attends meetings of clubs or organizations: Not on file    Relationship status: Not on file  . Intimate partner violence:    Fear of current or ex partner: Not on file    Emotionally abused: Not on file    Physically abused: Not on file    Forced sexual activity: Not on file  Other Topics Concern  . Not on file  Social History Narrative  .  Not on file   Family History  Problem Relation Age of Onset  . Heart disease Mother   . Heart disease Father   . Stroke Father   . Cancer Cousin        liver and colon  . Stroke Sister       VITAL SIGNS BP (!) 158/70   Pulse 72   Temp 97.6 F (36.4 C)   Resp 18   Ht 4\' 9"  (1.448 m)   Wt 158 lb (71.7 kg)   SpO2 98%   BMI 34.19 kg/m   Outpatient Encounter Medications as of 11/06/2017  Medication Sig  . acetaminophen (TYLENOL) 325 MG tablet Take 650 mg by mouth every 6 (six) hours as needed for mild pain, moderate pain or fever.   Marland Kitchen aspirin EC 325 MG tablet Take 1 tablet (325 mg total) by mouth daily.  Marland Kitchen atorvastatin (LIPITOR) 20 MG tablet Take 1 tablet (20 mg total) by mouth at bedtime.  . benztropine (COGENTIN) 1 MG tablet Take 1 mg by mouth at bedtime.  . Calcium Carbonate-Vitamin D (CALCIUM 600-D) 600-400 MG-UNIT per tablet Take 1 tablet by mouth at bedtime.   . cholecalciferol (VITAMIN D) 1000  UNITS tablet Take 1,000 Units by mouth at bedtime.   . cyanocobalamin 1000 MCG tablet Take 1,000 mcg by mouth daily with breakfast.   . docusate sodium (COLACE) 100 MG capsule Take 100 mg by mouth daily.  Marland Kitchen donepezil (ARICEPT) 5 MG tablet Take 5 mg by mouth at bedtime.   Marland Kitchen escitalopram (LEXAPRO) 20 MG tablet Take 20 mg by mouth daily with breakfast.   . Hypromellose 0.4 % SOLN Place 1 drop into both eyes 3 (three) times daily.  . insulin aspart (NOVOLOG) 100 UNIT/ML injection Inject 8 Units into the skin 3 (three) times daily after meals. Notify MD if BS is less than 70 or greater than 450. Hold Insulin if BS is less than 70  . Insulin Detemir (LEVEMIR FLEXPEN) 100 UNIT/ML Pen Inject 17 Units into the skin at bedtime.   . lip balm (CARMEX) ointment 1 application. Apply to lips 4 times daily for cold sore  . loratadine (CLARITIN) 10 MG tablet Take 10 mg by mouth daily with breakfast.   . losartan (COZAAR) 50 MG tablet Take 50 mg by mouth daily with breakfast.   . Lotion Base LOTN Apply 1 application topically 2 (two) times daily.  . Memantine HCl ER (NAMENDA XR) 28 MG CP24 Take 28 mg by mouth daily with breakfast. For dementia  . Multiple Vitamin (MULTIVITAMIN) tablet Take 1 tablet by mouth daily.  . Nutritional Supplements (NUTRITIONAL SUPPLEMENT PO) Regular Diet - Pureed texture, Nectar Thickened fluids consistency.  Large portions  . Nutritional Supplements (NUTRITIONAL SUPPLEMENT PO) Frozen Nutritional treat two times daily for weight support  . OLANZapine (ZYPREXA) 5 MG tablet Take 5 mg by mouth at bedtime.  Marland Kitchen omeprazole (PRILOSEC) 20 MG capsule Take 20 mg by mouth daily at 6 (six) AM.  . white petrolatum (VASELINE) GEL Apply to left forehead every shift   No facility-administered encounter medications on file as of 11/06/2017.      SIGNIFICANT DIAGNOSTIC EXAMS  PREVIOUS  12-30-14: right eye intraocular injection     03-12-16: EEG: This is a mildly abnormal EEG due to the presence of   moderate generalized slowing which is indicative of bihemispheric dysfunction which is a nonspecific finding seen in a variety of degenerative, hypoxic, ischemic, toxic, metabolic etiologies. No definite epileptiform activity is noted  12-03-16: chest x-ray: no acute cardiopulmonary disease  01-10-17: chest x-ray: no acute cardiopulmonary disease  04-02-17: ct of head:  1. No acute intracranial pathology seen on CT. 2. Mild to moderate cortical volume loss and scattered small vessel ischemic microangiopathy. 3. Partial opacification of the mastoid air cells bilaterally.  04-02-17: ct angio of head and neck: Minor extracranial and intracranial atheromatous change, not unexpected for age, and stable from 2017. No large vessel occlusion, dissection, or other significant vascular finding. Atrophy and small vessel disease without acute intracranial process.  04-02-17: MRI of brain: Atrophy and small vessel disease. No acute intracranial findings.  NO NEW EXAMS      LABS REVIEWED: PREVIOUS    11-02-16: glucose 198; bun 17.0; creat 0.89; k+ 4.7; na++ 137; liver normal albumin 4.0 hgb a1c 6.9; chol 162; ldl 85; trig 157; hdl 45 03-29-17: chol 162; ldl 75 trig 214; hdl 44; hgb a1c 9.4 04-02-17: wbc 8.5; hgb 14.2; hct 44.1; mcv 86.0; plt 213; glucose 175; bun 20; creat 1.10; k+ 4.2; na++ 138; ca 9.0; liver normal albumin 3.1; ammonia 14 04-04-17: wbc 8.7; hgb 11.1; hct 34.0; mcv 85.4; plt 188; glucose 97; bun 16; creat 0.87; k+ 3.7; na++137; ca 8.1 05-10-17: wbc 7.3; hgb 12.7; hct 39.7; mcv 85.3; plt 189; glucose 210; bun 24.2; creat 0.92; k+ 4.4; na++ 140; ca 9.2  09-13-17: chol 183; ldl 100; trig 194; hdl 44; hgb a1c 7.6   NO NEW LABS.    Review of Systems  Constitutional: Negative for malaise/fatigue.  Respiratory: Negative for cough and shortness of breath.   Cardiovascular: Negative for chest pain, palpitations and leg swelling.  Gastrointestinal: Negative for abdominal pain, constipation  and heartburn.  Musculoskeletal: Negative for back pain, joint pain and myalgias.  Skin: Negative.   Neurological: Negative for dizziness.  Psychiatric/Behavioral: The patient is not nervous/anxious.     Physical Exam  Constitutional: She appears well-developed and well-nourished. No distress.  Neck: No thyromegaly present.  Cardiovascular: Normal rate, regular rhythm and intact distal pulses.  Murmur heard. 1/6  Pulmonary/Chest: Effort normal and breath sounds normal. No respiratory distress.  Abdominal: Soft. Bowel sounds are normal. She exhibits no distension. There is no tenderness.  Musculoskeletal: Normal range of motion. She exhibits no edema.  Using wheelchair   Lymphadenopathy:    She has no cervical adenopathy.  Neurological: She is alert.  Skin: Skin is warm and dry. She is not diaphoretic.  Psychiatric: She has a normal mood and affect.     ASSESSMENT/ PLAN:  TODAY:   1. Dementia without behavior disturbance; is without change will continue aricept 5 mg daily and namenda xr 28 mg daily will not make changes will monitor   Her current weight is 158 pounds and is currently stable.   2. Tardive dyskinesia: stable  she does not have a resting tremor or lip tremor . Will continue cogentin 1 mg nightly   3. Hyperlipidemia associated with type 2 diabetes mellitus: stable  ldl 75; trig 214  will continue lipitor 20 mg daily   4. Controlled diabetes type II without complication with long term insulin use:  cbgs are slightly elevated   hgb a1c 6.9 (previous 7.2)  Will continue  novolog  8 units with meals will increase levemir to 20 units nightly    Is on  statin and arb, asa   PREVIOUS    5. Dysphagia, oropharyngeal phase:stable she is on nectar thick liquids; no signs of aspiration present   6. [name]  without esophagitis: stable will continue prilosec 20  mg daily   7. Chronic constipation stable : will continue colace daily    8. Depressive dissorder: stable  will  continue lexapro 20 mg daily will monitor   9. Schizophrenia: is stable continue  zyprexa 5  mg nightly and will monitor   Is followed by psych services.  Will continue cogentin 1 mg nightly for tardive dyskinesia and will monitor   10. Chronic allergic bronchitis: will continue claritin 10 mg daily  11. TIA: is presently stable; will continue asa 325 mg daily   12. Essential hypertension benign: b/p 158/70  is stable : will continue cozaar 50 mg daily     MD is aware of resident's narcotic use and is in agreement with current plan of care. We will attempt to wean resident as apropriate   Ok Edwards NP [facility]  Contact [phone] Monday through Friday 8am- 5pm  After hours call [phone]

## 2017-11-12 DIAGNOSIS — H04123 Dry eye syndrome of bilateral lacrimal glands: Secondary | ICD-10-CM | POA: Diagnosis not present

## 2017-11-12 DIAGNOSIS — E119 Type 2 diabetes mellitus without complications: Secondary | ICD-10-CM | POA: Diagnosis not present

## 2017-11-12 DIAGNOSIS — H353133 Nonexudative age-related macular degeneration, bilateral, advanced atrophic without subfoveal involvement: Secondary | ICD-10-CM | POA: Diagnosis not present

## 2017-11-12 DIAGNOSIS — Z794 Long term (current) use of insulin: Secondary | ICD-10-CM | POA: Diagnosis not present

## 2017-11-14 DIAGNOSIS — E1051 Type 1 diabetes mellitus with diabetic peripheral angiopathy without gangrene: Secondary | ICD-10-CM | POA: Diagnosis not present

## 2017-11-14 DIAGNOSIS — B351 Tinea unguium: Secondary | ICD-10-CM | POA: Diagnosis not present

## 2017-11-14 DIAGNOSIS — L84 Corns and callosities: Secondary | ICD-10-CM | POA: Diagnosis not present

## 2017-11-16 ENCOUNTER — Encounter: Payer: Self-pay | Admitting: Adult Health

## 2017-11-21 ENCOUNTER — Encounter: Payer: Self-pay | Admitting: Podiatry

## 2017-11-21 ENCOUNTER — Ambulatory Visit (INDEPENDENT_AMBULATORY_CARE_PROVIDER_SITE_OTHER): Payer: Medicare Other | Admitting: Podiatry

## 2017-11-21 DIAGNOSIS — M21619 Bunion of unspecified foot: Secondary | ICD-10-CM

## 2017-11-21 DIAGNOSIS — F251 Schizoaffective disorder, depressive type: Secondary | ICD-10-CM | POA: Diagnosis not present

## 2017-11-21 DIAGNOSIS — M79675 Pain in left toe(s): Secondary | ICD-10-CM

## 2017-11-21 DIAGNOSIS — F0151 Vascular dementia with behavioral disturbance: Secondary | ICD-10-CM | POA: Diagnosis not present

## 2017-11-21 DIAGNOSIS — B351 Tinea unguium: Secondary | ICD-10-CM

## 2017-11-21 DIAGNOSIS — M79674 Pain in right toe(s): Secondary | ICD-10-CM

## 2017-11-21 NOTE — Progress Notes (Signed)
Subjective:   Patient ID: Debra Tran, female   DOB: 84 y.o.   MRN: [id number]   HPI Patient presents with caregiver with nail disease 1-5 both feet and concerns about redness around the bunion site right with history of ulceration between the hallux and second toe that they are concerned about   ROS      Objective:  Physical Exam  Neurovascular status unchanged with thick yellow brittle nailbeds 1-5 both feet that become incurvated tender in the corners and she cannot cut.  Also noted to have large structural bunion deformity right with deviation of the hallux against the second toe with redness between the toes and around the first metatarsal head     Assessment:  Mycotic infection with Tran 1-5 both feet nails and structural bunion deformity with history of ulceration and redness around the first metatarsal     Plan:  H&P conditions reviewed and today I debrided nailbeds 1-5 both feet with no iatrogenic bleeding noted.  I then went ahead and advised on shoe gear modifications to take pressure off the adjacent digits and to come in immediately if any ulceration should form.  Patient will be seen back for routine care 3 months

## 2017-11-27 ENCOUNTER — Encounter: Payer: Self-pay | Admitting: Internal Medicine

## 2017-11-27 ENCOUNTER — Encounter (INDEPENDENT_AMBULATORY_CARE_PROVIDER_SITE_OTHER): Payer: Medicare Other | Admitting: Ophthalmology

## 2017-11-27 DIAGNOSIS — H43813 Vitreous degeneration, bilateral: Secondary | ICD-10-CM

## 2017-11-27 DIAGNOSIS — H35033 Hypertensive retinopathy, bilateral: Secondary | ICD-10-CM | POA: Diagnosis not present

## 2017-11-27 DIAGNOSIS — H353231 Exudative age-related macular degeneration, bilateral, with active choroidal neovascularization: Secondary | ICD-10-CM

## 2017-11-27 DIAGNOSIS — I10 Essential (primary) hypertension: Secondary | ICD-10-CM | POA: Diagnosis not present

## 2017-12-06 ENCOUNTER — Encounter: Payer: Self-pay | Admitting: Adult Health

## 2017-12-06 ENCOUNTER — Non-Acute Institutional Stay (SKILLED_NURSING_FACILITY): Payer: Medicare Other | Admitting: Adult Health

## 2017-12-06 DIAGNOSIS — F32A Depression, unspecified: Secondary | ICD-10-CM

## 2017-12-06 DIAGNOSIS — R1312 Dysphagia, oropharyngeal phase: Secondary | ICD-10-CM | POA: Diagnosis not present

## 2017-12-06 DIAGNOSIS — K219 Gastro-esophageal reflux disease without esophagitis: Secondary | ICD-10-CM | POA: Diagnosis not present

## 2017-12-06 DIAGNOSIS — K5909 Other constipation: Secondary | ICD-10-CM | POA: Diagnosis not present

## 2017-12-06 DIAGNOSIS — F329 Major depressive disorder, single episode, unspecified: Secondary | ICD-10-CM

## 2017-12-06 NOTE — Progress Notes (Signed)
Location:   [facility] Room Number: 225 B Place of Service:  SNF (31)   CODE STATUS: Full Code (Most form updated 05/02/17)  Allergies  Allergen Reactions  . Ace Inhibitors     Unknown reaction per MAR   . Ether Nausea And Vomiting    Chief Complaint  Patient presents with  . Medical Management of Chronic Issues    Dysphagia; gerd; constipation; depression     HPI:  She is a 84 year old long term resident of this facility being seen for the management of her chronic illnesses: dysphagia; gerd; constipation; depression. She denies any heart burn; constipation; no choking present. There are no nursing concerns at this time.   Past Medical History:  Diagnosis Date  . Allergic rhinitis   . CATARACT 06/06/2006   Qualifier: Diagnosis of  By: Genene Churn MD, Janett Billow    . Controlled diabetes mellitus type II without complication ([place]) 10/22/9676   Qualifier: Diagnosis of  By: Genene Churn MD, Janett Billow    . Depressive disorder   . Diabetes mellitus type 2 in obese ([place])   . Essential hypertension, benign 02/24/2015  . Fungal dermatitis   . Hyperlipidemia   . Macular degeneration   . Mild cognitive impairment   . Narcissism ([place])   . Normocytic anemia   . Phobia   . Schizophrenia, paranoid ([place])   . Stress incontinence, female     Past Surgical History:  Procedure Laterality Date  . ABDOMINAL HYSTERECTOMY     due to fibroids  . Avastin Ingection Right 03/26/2017   In Right eye  . CATARACT EXTRACTION  6 16 2006  . CRYOTHERAPY  2 15 2006   facial AK  . hyperplastic   4 20 2006   AK shave biopsy  . intraocular injection  [id number]  . TOTAL ABDOMINAL HYSTERECTOMY W/ BILATERAL SALPINGOOPHORECTOMY     due to endometriosis  non malignant    Social History   Socioeconomic History  . Marital status: Divorced    Spouse name: Not on file  . Number of children: Not on file  . Years of education: Not on file  . Highest education level: Not on file  Occupational  History  . Not on file  Social Needs  . Financial resource strain: Not on file  . Food insecurity:    Worry: Not on file    Inability: Not on file  . Transportation needs:    Medical: Not on file    Non-medical: Not on file  Tobacco Use  . Smoking status: Never Smoker  . Smokeless tobacco: Never Used  Substance and Sexual Activity  . Alcohol use: No  . Drug use: No  . Sexual activity: Not on file  Lifestyle  . Physical activity:    Days per week: Not on file    Minutes per session: Not on file  . Stress: Not on file  Relationships  . Social connections:    Talks on phone: Not on file    Gets together: Not on file    Attends religious service: Not on file    Active member of club or organization: Not on file    Attends meetings of clubs or organizations: Not on file    Relationship status: Not on file  . Intimate partner violence:    Fear of current or ex partner: Not on file    Emotionally abused: Not on file    Physically abused: Not on file    Forced sexual  activity: Not on file  Other Topics Concern  . Not on file  Social History Narrative  . Not on file   Family History  Problem Relation Age of Onset  . Heart disease Mother   . Heart disease Father   . Stroke Father   . Cancer Cousin        liver and colon  . Stroke Sister       VITAL SIGNS BP 128/60   Pulse 66   Temp 98.2 F (36.8 C)   Resp 20   Ht 4\' 9"  (1.448 m)   Wt 162 lb 6.4 oz (73.7 kg)   SpO2 97%   BMI 35.14 kg/m   Outpatient Encounter Medications as of 12/06/2017  Medication Sig  . acetaminophen (TYLENOL) 325 MG tablet Take 650 mg by mouth every 6 (six) hours as needed for mild pain, moderate pain or fever.   Marland Kitchen aspirin EC 325 MG tablet Take 1 tablet (325 mg total) by mouth daily.  Marland Kitchen atorvastatin (LIPITOR) 20 MG tablet Take 1 tablet (20 mg total) by mouth at bedtime.  . benztropine (COGENTIN) 1 MG tablet Take 1 mg by mouth at bedtime.  . Calcium Carbonate-Vitamin D (CALCIUM 600-D)  600-400 MG-UNIT per tablet Take 1 tablet by mouth at bedtime.   . cholecalciferol (VITAMIN D) 1000 UNITS tablet Take 1,000 Units by mouth at bedtime.   . cyanocobalamin 1000 MCG tablet Take 1,000 mcg by mouth daily with breakfast.   . docusate sodium (COLACE) 100 MG capsule Take 100 mg by mouth daily.  Marland Kitchen donepezil (ARICEPT) 5 MG tablet Take 5 mg by mouth at bedtime.   Marland Kitchen escitalopram (LEXAPRO) 20 MG tablet Take 20 mg by mouth daily with breakfast.   . Hypromellose 0.4 % SOLN Place 1 drop into both eyes 3 (three) times daily.  . insulin aspart (NOVOLOG) 100 UNIT/ML injection 0 - 70 = 0 units 71 - 400 = 8 units Notify MD if BS is less than 70 or greater than 400. Hold Insulin if BS is less than 70  . Insulin Glargine (LANTUS SOLOSTAR) 100 UNIT/ML Solostar Pen Inject 20 Units into the skin daily.  Marland Kitchen lip balm (CARMEX) ointment 1 application. Apply to lips 4 times daily for cold sore  . loratadine (CLARITIN) 10 MG tablet Take 10 mg by mouth daily with breakfast.   . losartan (COZAAR) 50 MG tablet Take 50 mg by mouth daily with breakfast.   . Lotion Base LOTN Apply 1 application topically 2 (two) times daily.  . Memantine HCl ER (NAMENDA XR) 28 MG CP24 Take 28 mg by mouth daily with breakfast. For dementia  . Multiple Vitamin (MULTIVITAMIN) tablet Take 1 tablet by mouth daily.  . Nutritional Supplements (NUTRITIONAL SUPPLEMENT PO) Regular Diet - Pureed texture, Nectar Thickened fluids consistency.  Large portions  . Nutritional Supplements (NUTRITIONAL SUPPLEMENT PO) Frozen Nutritional treat - 4 oz two times daily for weight support  . OLANZapine (ZYPREXA) 5 MG tablet Take 5 mg by mouth at bedtime.  Marland Kitchen omeprazole (PRILOSEC) 20 MG capsule Take 20 mg by mouth daily at 6 (six) AM.  . white petrolatum (VASELINE) GEL Apply to left forehead every shift  . [DISCONTINUED] Insulin Detemir (LEVEMIR FLEXPEN) 100 UNIT/ML Pen Inject 20 Units into the skin at bedtime.    No facility-administered encounter  medications on file as of 12/06/2017.      SIGNIFICANT DIAGNOSTIC EXAMS  PREVIOUS  12-30-14: right eye intraocular injection     03-12-16: EEG: This is  a mildly abnormal EEG due to the presence of  moderate generalized slowing which is indicative of bihemispheric dysfunction which is a nonspecific finding seen in a variety of degenerative, hypoxic, ischemic, toxic, metabolic etiologies. No definite epileptiform activity is noted  12-03-16: chest x-ray: no acute cardiopulmonary disease  01-10-17: chest x-ray: no acute cardiopulmonary disease  04-02-17: ct of head:  1. No acute intracranial pathology seen on CT. 2. Mild to moderate cortical volume loss and scattered small vessel ischemic microangiopathy. 3. Partial opacification of the mastoid air cells bilaterally.  04-02-17: ct angio of head and neck: Minor extracranial and intracranial atheromatous change, not unexpected for age, and stable from 2017. No large vessel occlusion, dissection, or other significant vascular finding. Atrophy and small vessel disease without acute intracranial process.  04-02-17: MRI of brain: Atrophy and small vessel disease. No acute intracranial findings.  NO NEW EXAMS      LABS REVIEWED: PREVIOUS    03-29-17: chol 162; ldl 75 trig 214; hdl 44; hgb a1c 9.4 04-02-17: wbc 8.5; hgb 14.2; hct 44.1; mcv 86.0; plt 213; glucose 175; bun 20; creat 1.10; k+ 4.2; na++ 138; ca 9.0; liver normal albumin 3.1; ammonia 14 04-04-17: wbc 8.7; hgb 11.1; hct 34.0; mcv 85.4; plt 188; glucose 97; bun 16; creat 0.87; k+ 3.7; na++137; ca 8.1 05-10-17: wbc 7.3; hgb 12.7; hct 39.7; mcv 85.3; plt 189; glucose 210; bun 24.2; creat 0.92; k+ 4.4; na++ 140; ca 9.2  09-13-17: chol 183; ldl 100; trig 194; hdl 44; hgb a1c 7.6   NO NEW LABS.   Review of Systems  Constitutional: Negative for malaise/fatigue.  Respiratory: Negative for cough and shortness of breath.   Cardiovascular: Negative for chest pain, palpitations and leg  swelling.  Gastrointestinal: Negative for abdominal pain, constipation and heartburn.  Musculoskeletal: Negative for back pain, joint pain and myalgias.  Skin: Negative.   Neurological: Negative for dizziness.  Psychiatric/Behavioral: The patient is not nervous/anxious.      Physical Exam  Constitutional: She appears well-developed and well-nourished. No distress.  Neck: No thyromegaly present.  Cardiovascular: Normal rate, regular rhythm and intact distal pulses.  Murmur heard. 1/6  Pulmonary/Chest: Effort normal and breath sounds normal. No respiratory distress.  Abdominal: Soft. Bowel sounds are normal. She exhibits no distension. There is no tenderness.  Musculoskeletal: Normal range of motion. She exhibits no edema.  Uses wheelchair   Lymphadenopathy:    She has no cervical adenopathy.  Neurological: She is alert.  Skin: Skin is warm and dry. She is not diaphoretic.  Psychiatric: She has a normal mood and affect.     ASSESSMENT/ PLAN:  TODAY:   1. Dysphagia, oropharyngeal phase:stable she is on nectar thick liquids; no signs of aspiration present   2. Gerd without esophagitis: stable will continue prilosec 20  mg daily   3. Chronic constipation stable : will continue colace daily    4. Depressive dissorder: stable  will continue lexapro 20 mg daily will monitor   PREVIOUS    5. Schizophrenia: is stable continue  zyprexa 5  mg nightly and will monitor   Is followed by psych services.  Will continue cogentin 1 mg nightly for tardive dyskinesia and will monitor   6. Chronic allergic bronchitis: will continue claritin 10 mg daily  7. TIA: is presently stable; will continue asa 325 mg daily   8. Essential hypertension benign: b/p 128/60  is stable : will continue cozaar 50 mg daily   9. Dementia without behavior disturbance; is without  change will continue aricept 5 mg daily and namenda xr 28 mg daily will not make changes will monitor   Her current weight is 162  pounds and is currently stable.   10. Tardive dyskinesia: stable  she does not have a resting tremor or lip tremor . Will continue cogentin 1 mg nightly   11. Hyperlipidemia associated with type 2 diabetes mellitus: stable  ldl 75; trig 214  will continue lipitor 20 mg daily   12. Controlled diabetes type II without complication with long term insulin use:  cbgs are slightly elevated   hgb a1c 6.9 (previous 7.2)  Will continue  novolog  8 units with meals lantus 20 units nightly    Is on  statin and arb, asa    MD is aware of resident's narcotic use and is in agreement with current plan of care. We will attempt to wean resident as apropriate   Ok Edwards NP [facility]  Contact [phone] Monday through Friday 8am- 5pm  After hours call [phone]

## 2017-12-10 DIAGNOSIS — D649 Anemia, unspecified: Secondary | ICD-10-CM | POA: Diagnosis not present

## 2017-12-10 DIAGNOSIS — Z79899 Other long term (current) drug therapy: Secondary | ICD-10-CM | POA: Diagnosis not present

## 2017-12-10 LAB — CBC AND DIFFERENTIAL
HCT: 40 (ref 36–46)
Hemoglobin: 13 (ref 12.0–16.0)
Neutrophils Absolute: 6
PLATELETS: 237 (ref 150–399)
WBC: 9.1

## 2017-12-10 LAB — HEPATIC FUNCTION PANEL
ALK PHOS: 111 (ref 25–125)
ALT: 14 (ref 7–35)
AST: 20 (ref 13–35)
Bilirubin, Total: 0.4

## 2017-12-10 LAB — BASIC METABOLIC PANEL
BUN: 14 (ref 4–21)
Creatinine: 0.9 (ref 0.5–1.1)
Glucose: 173
Potassium: 4.1 (ref 3.4–5.3)
Sodium: 141 (ref 137–147)

## 2017-12-16 ENCOUNTER — Encounter: Payer: Self-pay | Admitting: Internal Medicine

## 2017-12-16 ENCOUNTER — Non-Acute Institutional Stay (SKILLED_NURSING_FACILITY): Payer: Medicare Other | Admitting: Internal Medicine

## 2017-12-16 DIAGNOSIS — R4182 Altered mental status, unspecified: Secondary | ICD-10-CM

## 2017-12-16 DIAGNOSIS — R079 Chest pain, unspecified: Secondary | ICD-10-CM | POA: Diagnosis not present

## 2017-12-16 DIAGNOSIS — D649 Anemia, unspecified: Secondary | ICD-10-CM | POA: Diagnosis not present

## 2017-12-16 DIAGNOSIS — F0150 Vascular dementia without behavioral disturbance: Secondary | ICD-10-CM

## 2017-12-16 DIAGNOSIS — R0989 Other specified symptoms and signs involving the circulatory and respiratory systems: Secondary | ICD-10-CM

## 2017-12-16 DIAGNOSIS — Z79899 Other long term (current) drug therapy: Secondary | ICD-10-CM | POA: Diagnosis not present

## 2017-12-16 LAB — CBC AND DIFFERENTIAL
HCT: 37 (ref 36–46)
HEMOGLOBIN: 12.4 (ref 12.0–16.0)
NEUTROS ABS: 7
PLATELETS: 220 (ref 150–399)
WBC: 10.7

## 2017-12-16 LAB — HEPATIC FUNCTION PANEL
ALK PHOS: 115 (ref 25–125)
ALT: 14 (ref 7–35)
AST: 16 (ref 13–35)
BILIRUBIN, TOTAL: 0.2

## 2017-12-16 LAB — BASIC METABOLIC PANEL
BUN: 23 — AB (ref 4–21)
Creatinine: 1.1 (ref 0.5–1.1)
Glucose: 253
Potassium: 4.3 (ref 3.4–5.3)
Sodium: 136 — AB (ref 137–147)

## 2017-12-16 NOTE — Progress Notes (Signed)
Patient ID: Debra Tran, female   DOB: 10/05/1927, 84 y.o.   MRN: [id number]   Location:  [facility] Room Number: [place] of Service:  SNF (31) Provider:  [place], [place], DO  Patient Care Team: Gildardo Cranker, DO as PCP - General (Internal Medicine) Hayden Pedro, MD as Attending Physician (Ophthalmology) Nyoka Cowden Phylis Bougie, NP as Nurse Practitioner (Nurse Practitioner) [place] [place] ([place])  Extended Emergency Contact Information Primary Emergency Contact: Deutschman,[name] Address: [address]          [place], [place] [address] [name] of [place] Phone: [phone] Relation: Daughter Secondary Emergency Contact: [place] Phone: [phone] Relation: Other  Code Status:  Full Code Goals of care: Advanced Directive information Advanced Directives 12/16/2017  Does Patient Have a Medical Advance Directive? Yes  Type of Advance Directive Out of facility DNR (pink MOST or yellow form)  Does patient want to make changes to medical advance directive? No - Patient declined  Copy of [place] in Chart? -  Would patient like information on creating a medical advance directive? -  Pre-existing out of facility DNR order (yellow form or pink MOST form) Pink MOST form placed in chart (order not valid for inpatient use)     Chief Complaint  Patient presents with  . Acute Visit    Change in status    HPI:  Pt is a 84 y.o. female seen today for an acute visit for change in status with decreased po intake. Pt c/o CP but no SOB. No f/c. No change in bowel/bladder habits. No recent falls. She is a poor historian due to psych d/o. Hx obtained from chart.    Past Medical History:  Diagnosis Date  . Allergic rhinitis   . CATARACT 06/06/2006   Qualifier: Diagnosis of  By: Genene Churn MD, Janett Billow    . Controlled diabetes mellitus type II without complication ([place]) 1/44/8185   Qualifier: Diagnosis of  By: Genene Churn MD, Janett Billow    . Depressive disorder   . Diabetes mellitus type 2 in obese ([place])   . Essential hypertension, benign 02/24/2015  . Fungal dermatitis   . Hyperlipidemia   . Macular degeneration   . Mild cognitive impairment   . Narcissism ([place])   . Normocytic anemia   . Phobia   . Schizophrenia, paranoid ([place])   . Stress incontinence, female    Past Surgical History:  Procedure Laterality Date  . ABDOMINAL HYSTERECTOMY     due to fibroids  . Avastin Ingection Right 03/26/2017   In Right eye  . CATARACT EXTRACTION  6 16 2006  . CRYOTHERAPY  2 15 2006   facial AK  . hyperplastic   4 20 2006   AK shave biopsy  . intraocular injection  [id number]  . TOTAL ABDOMINAL HYSTERECTOMY W/ BILATERAL SALPINGOOPHORECTOMY     due to endometriosis  non malignant    Allergies  Allergen Reactions  . Ace Inhibitors     Unknown reaction per MAR   . Ether Nausea And Vomiting    Outpatient Encounter Medications as of 12/16/2017  Medication Sig  . acetaminophen (TYLENOL) 325 MG tablet Take 650 mg by mouth every 6 (six) hours as needed for mild pain, moderate pain or fever.   Marland Kitchen aspirin EC 325 MG tablet Take 1 tablet (325 mg total) by mouth daily.  Marland Kitchen atorvastatin (LIPITOR) 20 MG tablet Take 1 tablet (20 mg total) by mouth  at bedtime.  . benztropine (COGENTIN) 1 MG tablet Take 1 mg by mouth at bedtime.  . Calcium Carbonate-Vitamin D (CALCIUM 600-D) 600-400 MG-UNIT per tablet Take 1 tablet by mouth at bedtime.   . cholecalciferol (VITAMIN D) 1000 UNITS tablet Take 1,000 Units by mouth at bedtime.   . cyanocobalamin 1000 MCG tablet Take 1,000 mcg by mouth daily with breakfast.   . docusate sodium (COLACE) 100 MG capsule Take 100 mg by mouth daily.  Marland Kitchen donepezil (ARICEPT) 5 MG tablet Take 5 mg by mouth at bedtime.   Marland Kitchen escitalopram (LEXAPRO) 20 MG tablet Take 20 mg by mouth daily with breakfast.   . Hypromellose 0.4 % SOLN Place 1 drop into both eyes 3 (three) times  daily.  . insulin aspart (NOVOLOG) 100 UNIT/ML injection 0 - 70 = 0 units 71 - 400 = 8 units Notify MD if BS is less than 70 or greater than 400. Hold Insulin if BS is less than 70  . Insulin Glargine (LANTUS SOLOSTAR) 100 UNIT/ML Solostar Pen Inject 20 Units into the skin daily.  Marland Kitchen lip balm (CARMEX) ointment 1 application. Apply to lips 4 times daily for cold sore  . loratadine (CLARITIN) 10 MG tablet Take 10 mg by mouth daily with breakfast.   . losartan (COZAAR) 50 MG tablet Take 50 mg by mouth daily with breakfast.   . Lotion Base LOTN Apply 1 application topically 2 (two) times daily.  . Memantine HCl ER (NAMENDA XR) 28 MG CP24 Take 28 mg by mouth daily with breakfast. For dementia  . Multiple Vitamin (MULTIVITAMIN) tablet Take 1 tablet by mouth daily.  . Nutritional Supplements (NUTRITIONAL SUPPLEMENT PO) Regular Diet - Pureed texture, Nectar Thickened fluids consistency.  Large portions  . Nutritional Supplements (NUTRITIONAL SUPPLEMENT PO) Frozen Nutritional treat - 4 oz two times daily for weight support  . OLANZapine (ZYPREXA) 5 MG tablet Take 5 mg by mouth at bedtime.  Marland Kitchen omeprazole (PRILOSEC) 20 MG capsule Take 20 mg by mouth daily at 6 (six) AM.  . white petrolatum (VASELINE) GEL Apply to left forehead every shift   No facility-administered encounter medications on file as of 12/16/2017.     Review of Systems  Unable to perform ROS: Other    Immunization History  Administered Date(s) Administered  . Influenza Split 02/15/2011, 01/21/2012  . Influenza Whole 02/05/2007, 01/17/2010, 01/28/2013  . Influenza-Unspecified 06/04/2014, 05/17/2015, 01/15/2016, 02/01/2017  . PPD Test 08/24/2015, 08/14/2016, 11/05/2017, 11/19/2017  . Pneumococcal Polysaccharide-23 04/10/1999  . Td 10/07/2001   Pertinent  Health Maintenance Due  Topic Date Due  . INFLUENZA VACCINE  03/07/2018 (Originally 11/07/2017)  . HEMOGLOBIN A1C  03/15/2018  . OPHTHALMOLOGY EXAM  11/13/2018  . FOOT EXAM   11/15/2018  . DEXA SCAN  Completed  . PNA vac Low Risk Adult  Completed   Fall Risk  10/25/2016 02/27/2016 09/14/2014 05/29/2012  Falls in the past year? No No No -  Risk for fall due to : - - - (No Data)  Risk for fall due to: Comment - - - pt walks w/a walker   Functional Status Survey:    Vitals:   12/16/17 1132  BP: (!) 138/57  Pulse: 76  Resp: 18  Temp: (!) 97.2 F (36.2 C)  SpO2: 97%  Weight: 155 lb 3.2 oz (70.4 kg)  Height: 4\' 9"  (1.448 m)   Body mass index is 33.58 kg/m. Physical Exam  Constitutional: She appears well-developed. She appears distressed (min increased WOB).  Frail appearing in  NAD  HENT:  Mouth/Throat: Oropharynx is clear and moist. No oropharyngeal exudate.  MMM; no oral thrush  Eyes: Pupils are equal, round, and reactive to light. Right eye exhibits discharge. Left eye exhibits discharge. Right conjunctiva is injected. Left conjunctiva is injected. No scleral icterus.  Neck: Neck supple. No JVD present. Carotid bruit is not present. No tracheal deviation present. No thyromegaly present.  Cardiovascular: Normal rate, regular rhythm and intact distal pulses. Exam reveals no gallop and no friction rub.  Murmur (1/6 SEM) heard. Trace LE edema b/l; no calf TTP  Pulmonary/Chest: Effort normal. No stridor. No respiratory distress. She has no wheezes. She has rales (left base).  Abdominal: Soft. Normal appearance and bowel sounds are normal. She exhibits no distension and no mass. There is no hepatomegaly. There is no tenderness. There is no rigidity, no rebound and no guarding. No hernia.  obese  Musculoskeletal: She exhibits edema.  Lymphadenopathy:    She has no cervical adenopathy.  Neurological: She is alert. She has normal reflexes.  Skin: Skin is warm and dry. No rash noted.  Psychiatric: She has a normal mood and affect. Her behavior is normal.    Labs reviewed: Recent Labs    04/02/17 0140 04/03/17 0418 04/04/17 0401 05/10/17 12/10/17  NA 138  138 137 140 141  K 4.2 4.3 3.7 4.4 4.1  CL 99* 104 105  --   --   CO2 33* 25 28  --   --   GLUCOSE 175* 108* 97  --   --   BUN 20 17 16  24* 14  CREATININE 1.10* 0.87 0.87 0.9 0.9  CALCIUM 9.0 8.4* 8.1*  --   --    Recent Labs    03/29/17 04/02/17 0244 12/10/17  AST 16 25 20   ALT 16 18 14   ALKPHOS 117 87 111  BILITOT  --  0.7  --   PROT  --  6.5  --   ALBUMIN  --  3.1*  --    Recent Labs    04/02/17 0140 04/04/17 0401 05/10/17 12/10/17  WBC 8.5 8.7 7.3 9.1  NEUTROABS 4.9  --  4 6  HGB 14.2 11.1* 12.7 13.0  HCT 44.1 34.0* 40 40  MCV 86.0 85.4  --   --   PLT 213 188 189 237   Lab Results  Component Value Date   TSH 1.593 08/31/2011   Lab Results  Component Value Date   HGBA1C 7.6 09/13/2017   Lab Results  Component Value Date   CHOL 183 09/13/2017   HDL 44 09/13/2017   LDLCALC 100 09/13/2017   LDLDIRECT 92 08/31/2011   TRIG 194 (A) 09/13/2017   CHOLHDL 3.7 06/30/2010    Significant Diagnostic Results in last 30 days:  No results found.  Assessment/Plan   ICD-10-CM   1. Altered mental status, unspecified altered mental status type R41.82   2. Chest pain, unspecified type R07.9   3. Rales R09.89    left  4. Vascular dementia without behavioral disturbance F01.50      Cont current meds as ordered  Cont nutritional supplements as ordered  further recommendations to follow once xray and labs resulted  Labs/tests ordered: STAT CXR, CBC W DIFF, CMP, UA CX & S   Ayiden Milliman S. Perlie Gold  [facility] [address] [place], [place] [address] [phone] Cell (Monday-Friday 8 AM - 5 PM) [phone] After 5 PM and follow prompts

## 2017-12-19 DIAGNOSIS — F0150 Vascular dementia without behavioral disturbance: Secondary | ICD-10-CM | POA: Diagnosis not present

## 2017-12-19 DIAGNOSIS — R1312 Dysphagia, oropharyngeal phase: Secondary | ICD-10-CM | POA: Diagnosis not present

## 2017-12-19 DIAGNOSIS — F259 Schizoaffective disorder, unspecified: Secondary | ICD-10-CM | POA: Diagnosis not present

## 2017-12-19 DIAGNOSIS — M6281 Muscle weakness (generalized): Secondary | ICD-10-CM | POA: Diagnosis not present

## 2017-12-19 DIAGNOSIS — F0391 Unspecified dementia with behavioral disturbance: Secondary | ICD-10-CM | POA: Diagnosis not present

## 2017-12-19 DIAGNOSIS — R278 Other lack of coordination: Secondary | ICD-10-CM | POA: Diagnosis not present

## 2017-12-19 DIAGNOSIS — R2681 Unsteadiness on feet: Secondary | ICD-10-CM | POA: Diagnosis not present

## 2017-12-19 DIAGNOSIS — J189 Pneumonia, unspecified organism: Secondary | ICD-10-CM | POA: Diagnosis not present

## 2017-12-21 DIAGNOSIS — F259 Schizoaffective disorder, unspecified: Secondary | ICD-10-CM | POA: Diagnosis not present

## 2017-12-21 DIAGNOSIS — R2681 Unsteadiness on feet: Secondary | ICD-10-CM | POA: Diagnosis not present

## 2017-12-21 DIAGNOSIS — F0391 Unspecified dementia with behavioral disturbance: Secondary | ICD-10-CM | POA: Diagnosis not present

## 2017-12-21 DIAGNOSIS — J189 Pneumonia, unspecified organism: Secondary | ICD-10-CM | POA: Diagnosis not present

## 2017-12-21 DIAGNOSIS — R1312 Dysphagia, oropharyngeal phase: Secondary | ICD-10-CM | POA: Diagnosis not present

## 2017-12-21 DIAGNOSIS — F0150 Vascular dementia without behavioral disturbance: Secondary | ICD-10-CM | POA: Diagnosis not present

## 2017-12-23 ENCOUNTER — Non-Acute Institutional Stay (SKILLED_NURSING_FACILITY): Payer: Medicare Other

## 2017-12-23 DIAGNOSIS — Z0000 Encounter for general adult medical examination without abnormal findings: Secondary | ICD-10-CM | POA: Diagnosis not present

## 2017-12-23 DIAGNOSIS — F259 Schizoaffective disorder, unspecified: Secondary | ICD-10-CM | POA: Diagnosis not present

## 2017-12-23 DIAGNOSIS — J189 Pneumonia, unspecified organism: Secondary | ICD-10-CM | POA: Diagnosis not present

## 2017-12-23 DIAGNOSIS — R1312 Dysphagia, oropharyngeal phase: Secondary | ICD-10-CM | POA: Diagnosis not present

## 2017-12-23 DIAGNOSIS — R2681 Unsteadiness on feet: Secondary | ICD-10-CM | POA: Diagnosis not present

## 2017-12-23 DIAGNOSIS — F0150 Vascular dementia without behavioral disturbance: Secondary | ICD-10-CM | POA: Diagnosis not present

## 2017-12-23 DIAGNOSIS — F0391 Unspecified dementia with behavioral disturbance: Secondary | ICD-10-CM | POA: Diagnosis not present

## 2017-12-23 NOTE — Patient Instructions (Signed)
[name] , Thank you for taking time to come for your Medicare Wellness Visit. I appreciate your ongoing commitment to your health goals. Please review the following plan we discussed and let me know if I can assist you in the future.   Screening recommendations/referrals: Colonoscopy excluded, over age 82 Mammogram excluded, over age 53 Bone Density up to date Recommended yearly ophthalmology/optometry visit for glaucoma screening and checkup Recommended yearly dental visit for hygiene and checkup  Vaccinations: Influenza vaccine due, will receive at [facility] Pneumococcal vaccine up to date, completed Tdap vaccine up to date Shingles vaccine not in past records    Advanced directives: in chart  Conditions/risks identified: none  Next appointment: Dr. Eulas Post makes rounds   Preventive Care 65 Years and Older, Female Preventive care refers to lifestyle choices and visits with your health care provider that can promote health and wellness. What does preventive care include?  A yearly physical exam. This is also called an annual well check.  Dental exams once or twice a year.  Routine eye exams. Ask your health care provider how often you should have your eyes checked.  Personal lifestyle choices, including:  Daily care of your teeth and gums.  Regular physical activity.  Eating a healthy diet.  Avoiding tobacco and drug use.  Limiting alcohol use.  Practicing safe sex.  Taking low-dose aspirin every day.  Taking vitamin and mineral supplements as recommended by your health care provider. What happens during an annual well check? The services and screenings done by your health care provider during your annual well check will depend on your age, overall health, lifestyle risk factors, and family history of disease. Counseling  Your health care provider may ask you questions about your:  Alcohol use.  Tobacco use.  Drug use.  Emotional  well-being.  Home and relationship well-being.  Sexual activity.  Eating habits.  History of falls.  Memory and ability to understand (cognition).  Work and work Statistician.  Reproductive health. Screening  You may have the following tests or measurements:  Height, weight, and BMI.  Blood pressure.  Lipid and cholesterol levels. These may be checked every 5 years, or more frequently if you are over 42 years old.  Skin check.  Lung cancer screening. You may have this screening every year starting at age 16 if you have a 30-pack-year history of smoking and currently smoke or have quit within the past 15 years.  Fecal occult blood test (FOBT) of the stool. You may have this test every year starting at age 69.  Flexible sigmoidoscopy or colonoscopy. You may have a sigmoidoscopy every 5 years or a colonoscopy every 10 years starting at age 69.  Hepatitis C blood test.  Hepatitis B blood test.  Sexually transmitted disease (STD) testing.  Diabetes screening. This is done by checking your blood sugar (glucose) after you have not eaten for a while (fasting). You may have this done every 1-3 years.  Bone density scan. This is done to screen for osteoporosis. You may have this done starting at age 72.  Mammogram. This may be done every 1-2 years. Talk to your health care provider about how often you should have regular mammograms. Talk with your health care provider about your test results, treatment options, and if necessary, the need for more tests. Vaccines  Your health care provider may recommend certain vaccines, such as:  Influenza vaccine. This is recommended every year.  Tetanus, diphtheria, and acellular pertussis (Tdap, Td) vaccine. You  may need a Td booster every 10 years.  Zoster vaccine. You may need this after age 27.  Pneumococcal 13-valent conjugate (PCV13) vaccine. One dose is recommended after age 30.  Pneumococcal polysaccharide (PPSV23) vaccine. One  dose is recommended after age 82. Talk to your health care provider about which screenings and vaccines you need and how often you need them. This information is not intended to replace advice given to you by your health care provider. Make sure you discuss any questions you have with your health care provider. Document Released: 04/22/2015 Document Revised: 12/14/2015 Document Reviewed: 01/25/2015 Elsevier Interactive Patient Education  2017 [place] Prevention in the Home Falls can cause injuries. They can happen to people of all ages. There are many things you can do to make your home safe and to help prevent falls. What can I do on the outside of my home?  Regularly fix the edges of walkways and driveways and fix any cracks.  Remove anything that might make you trip as you walk through a door, such as a raised step or threshold.  Trim any bushes or trees on the path to your home.  Use bright outdoor lighting.  Clear any walking paths of anything that might make someone trip, such as rocks or tools.  Regularly check to see if handrails are loose or broken. Make sure that both sides of any steps have handrails.  Any raised decks and porches should have guardrails on the edges.  Have any leaves, snow, or ice cleared regularly.  Use sand or salt on walking paths during winter.  Clean up any spills in your garage right away. This includes oil or grease spills. What can I do in the bathroom?  Use night lights.  Install grab bars by the toilet and in the tub and shower. Do not use towel bars as grab bars.  Use non-skid mats or decals in the tub or shower.  If you need to sit down in the shower, use a plastic, non-slip stool.  Keep the floor dry. Clean up any water that spills on the floor as soon as it happens.  Remove soap buildup in the tub or shower regularly.  Attach bath mats securely with double-sided non-slip rug tape.  Do not have throw rugs and other  things on the floor that can make you trip. What can I do in the bedroom?  Use night lights.  Make sure that you have a light by your bed that is easy to reach.  Do not use any sheets or blankets that are too big for your bed. They should not hang down onto the floor.  Have a firm chair that has side arms. You can use this for support while you get dressed.  Do not have throw rugs and other things on the floor that can make you trip. What can I do in the kitchen?  Clean up any spills right away.  Avoid walking on wet floors.  Keep items that you use a lot in easy-to-reach places.  If you need to reach something above you, use a strong step stool that has a grab bar.  Keep electrical cords out of the way.  Do not use floor polish or wax that makes floors slippery. If you must use wax, use non-skid floor wax.  Do not have throw rugs and other things on the floor that can make you trip. What can I do with my stairs?  Do not leave any items  on the stairs.  Make sure that there are handrails on both sides of the stairs and use them. Fix handrails that are broken or loose. Make sure that handrails are as long as the stairways.  Check any carpeting to make sure that it is firmly attached to the stairs. Fix any carpet that is loose or worn.  Avoid having throw rugs at the top or bottom of the stairs. If you do have throw rugs, attach them to the floor with carpet tape.  Make sure that you have a light switch at the top of the stairs and the bottom of the stairs. If you do not have them, ask someone to add them for you. What else can I do to help prevent falls?  Wear shoes that:  Do not have high heels.  Have rubber bottoms.  Are comfortable and fit you well.  Are closed at the toe. Do not wear sandals.  If you use a stepladder:  Make sure that it is fully opened. Do not climb a closed stepladder.  Make sure that both sides of the stepladder are locked into place.  Ask  someone to hold it for you, if possible.  Clearly mark and make sure that you can see:  Any grab bars or handrails.  First and last steps.  Where the edge of each step is.  Use tools that help you move around (mobility aids) if they are needed. These include:  Canes.  Walkers.  Scooters.  Crutches.  Turn on the lights when you go into a dark area. Replace any light bulbs as soon as they burn out.  Set up your furniture so you have a clear path. Avoid moving your furniture around.  If any of your floors are uneven, fix them.  If there are any pets around you, be aware of where they are.  Review your medicines with your doctor. Some medicines can make you feel dizzy. This can increase your chance of falling. Ask your doctor what other things that you can do to help prevent falls. This information is not intended to replace advice given to you by your health care provider. Make sure you discuss any questions you have with your health care provider. Document Released: 01/20/2009 Document Revised: 09/01/2015 Document Reviewed: 04/30/2014 Elsevier Interactive Patient Education  2017 [employer].

## 2017-12-23 NOTE — Progress Notes (Signed)
Subjective:   Debra Tran is a 84 y.o. female who presents for Medicare Annual (Subsequent) preventive examination at [facility]; incapacitated patient unable to answer questions appropriately  Last AWV-10/25/2016    Objective:     Vitals: BP 130/60 (BP Location: Left Arm, Patient Position: Sitting)   Pulse 75   Temp 98 F (36.7 C) (Oral)   Ht 4\' 9"  (1.448 m)   Wt 155 lb (70.3 kg)   BMI 33.54 kg/m   Body mass index is 33.54 kg/m.  Advanced Directives 12/23/2017 12/16/2017 12/06/2017 11/06/2017 10/04/2017 09/30/2017 09/18/2017  Does Patient Have a Medical Advance Directive? Yes Yes Yes Yes Yes Yes Yes  Type of Advance Directive Out of facility DNR (pink MOST or yellow form) Out of facility DNR (pink MOST or yellow form) Out of facility DNR (pink MOST or yellow form) Out of facility DNR (pink MOST or yellow form) Out of facility DNR (pink MOST or yellow form) Out of facility DNR (pink MOST or yellow form) Out of facility DNR (pink MOST or yellow form)  Does patient want to make changes to medical advance directive? No - Patient declined No - Patient declined No - Patient declined No - Patient declined No - Patient declined No - Patient declined No - Patient declined  Copy of [place] in [place]  Would patient like information on creating a medical advance directive? - - - - - - -  Pre-existing out of facility DNR order (yellow form or pink MOST form) Pink MOST form placed in chart (order not valid for inpatient use) Pink MOST form placed in chart (order not valid for inpatient use) Pink MOST form placed in chart (order not valid for inpatient use) Pink MOST form placed in chart (order not valid for inpatient use) Pink MOST form placed in chart (order not valid for inpatient use) Pink MOST form placed in chart (order not valid for inpatient use) Pink MOST form placed in chart (order not valid for inpatient use)    Tobacco Social History   Tobacco Use    Smoking Status Never Smoker  Smokeless Tobacco Never Used     Counseling given: Not Answered   Clinical Intake:  Pre-visit preparation completed: No  Pain : No/denies pain     Nutritional Risks: None Diabetes: No  How often do you need to have someone help you when you read instructions, pamphlets, or other written materials from your doctor or pharmacy?: 5 - Always(dementia)  Interpreter Needed?: No  Information entered by :: Tyson Dense, RN  Past Medical History:  Diagnosis Date  . Allergic rhinitis   . CATARACT 06/06/2006   Qualifier: Diagnosis of  By: Genene Churn MD, Janett Billow    . Controlled diabetes mellitus type II without complication ([place]) 10/08/6376   Qualifier: Diagnosis of  By: Genene Churn MD, Janett Billow    . Depressive disorder   . Diabetes mellitus type 2 in obese ([place])   . Essential hypertension, benign 02/24/2015  . Fungal dermatitis   . Hyperlipidemia   . Macular degeneration   . Mild cognitive impairment   . Narcissism ([place])   . Normocytic anemia   . Phobia   . Schizophrenia, paranoid ([place])   . Stress incontinence, female    Past Surgical History:  Procedure Laterality Date  . ABDOMINAL HYSTERECTOMY     due to fibroids  . Avastin Ingection Right 03/26/2017   In Right eye  . CATARACT EXTRACTION  6 16 2006  . CRYOTHERAPY  2 15 2006   facial AK  . hyperplastic   4 20 2006   AK shave biopsy  . intraocular injection  [id number]  . TOTAL ABDOMINAL HYSTERECTOMY W/ BILATERAL SALPINGOOPHORECTOMY     due to endometriosis  non malignant   Family History  Problem Relation Age of Onset  . Heart disease Mother   . Heart disease Father   . Stroke Father   . Cancer Cousin        liver and colon  . Stroke Sister    Social History   Socioeconomic History  . Marital status: Divorced    Spouse name: Not on file  . Number of children: Not on file  . Years of education: Not on file  . Highest education level: Not on file  Occupational History  . Not on file   Social Needs  . Financial resource strain: Not on file  . Food insecurity:    Worry: Not on file    Inability: Not on file  . Transportation needs:    Medical: Not on file    Non-medical: Not on file  Tobacco Use  . Smoking status: Never Smoker  . Smokeless tobacco: Never Used  Substance and Sexual Activity  . Alcohol use: No  . Drug use: No  . Sexual activity: Not on file  Lifestyle  . Physical activity:    Days per week: Not on file    Minutes per session: Not on file  . Stress: Not on file  Relationships  . Social connections:    Talks on phone: Not on file    Gets together: Not on file    Attends religious service: Not on file    Active member of club or organization: Not on file    Attends meetings of clubs or organizations: Not on file    Relationship status: Not on file  Other Topics Concern  . Not on file  Social History Narrative  . Not on file    Outpatient Encounter Medications as of 12/23/2017  Medication Sig  . acetaminophen (TYLENOL) 325 MG tablet Take 650 mg by mouth every 6 (six) hours as needed for mild pain, moderate pain or fever.   Marland Kitchen aspirin EC 325 MG tablet Take 1 tablet (325 mg total) by mouth daily.  Marland Kitchen atorvastatin (LIPITOR) 20 MG tablet Take 1 tablet (20 mg total) by mouth at bedtime.  . benztropine (COGENTIN) 1 MG tablet Take 1 mg by mouth at bedtime.  . Calcium Carbonate-Vitamin D (CALCIUM 600-D) 600-400 MG-UNIT per tablet Take 1 tablet by mouth at bedtime.   . cholecalciferol (VITAMIN D) 1000 UNITS tablet Take 1,000 Units by mouth at bedtime.   . cyanocobalamin 1000 MCG tablet Take 1,000 mcg by mouth daily with breakfast.   . docusate sodium (COLACE) 100 MG capsule Take 100 mg by mouth daily.  Marland Kitchen donepezil (ARICEPT) 5 MG tablet Take 5 mg by mouth at bedtime.   Marland Kitchen escitalopram (LEXAPRO) 20 MG tablet Take 20 mg by mouth daily with breakfast.   . Hypromellose 0.4 % SOLN Place 1 drop into both eyes 3 (three) times daily.  . insulin aspart (NOVOLOG)  100 UNIT/ML injection 0 - 70 = 0 units 71 - 400 = 8 units Notify MD if BS is less than 70 or greater than 400. Hold Insulin if BS is less than 70  . Insulin Glargine (LANTUS SOLOSTAR) 100 UNIT/ML Solostar Pen Inject 20 Units into the skin  daily.  . lip balm (CARMEX) ointment 1 application. Apply to lips 4 times daily for cold sore  . loratadine (CLARITIN) 10 MG tablet Take 10 mg by mouth daily with breakfast.   . losartan (COZAAR) 50 MG tablet Take 50 mg by mouth daily with breakfast.   . Lotion Base LOTN Apply 1 application topically 2 (two) times daily.  . Memantine HCl ER (NAMENDA XR) 28 MG CP24 Take 28 mg by mouth daily with breakfast. For dementia  . Multiple Vitamin (MULTIVITAMIN) tablet Take 1 tablet by mouth daily.  . Nutritional Supplements (NUTRITIONAL SUPPLEMENT PO) Regular Diet - Pureed texture, Nectar Thickened fluids consistency.  Large portions  . Nutritional Supplements (NUTRITIONAL SUPPLEMENT PO) Frozen Nutritional treat - 4 oz two times daily for weight support  . OLANZapine (ZYPREXA) 5 MG tablet Take 5 mg by mouth at bedtime.  Marland Kitchen omeprazole (PRILOSEC) 20 MG capsule Take 20 mg by mouth daily at 6 (six) AM.  . white petrolatum (VASELINE) GEL Apply to left forehead every shift   No facility-administered encounter medications on file as of 12/23/2017.     Activities of Daily Living In your present state of health, do you have any difficulty performing the following activities: 12/23/2017 04/02/2017  Hearing? N N  Vision? Y N  Difficulty concentrating or making decisions? Tempie Donning  Walking or climbing stairs? Y Y  Dressing or bathing? Y Y  Doing errands, shopping? Tempie Donning  Preparing Food and eating ? Y -  Using the Toilet? Y -  In the past six months, have you accidently leaked urine? Y -  Do you have problems with loss of bowel control? Y -  Managing your Medications? Y -  Managing your Finances? Y -  Housekeeping or managing your Housekeeping? Y -  Some recent data might be  hidden    Patient Care Team: Gildardo Cranker, DO as PCP - General (Internal Medicine) Hayden Pedro, MD as Attending Physician (Ophthalmology) Nyoka Cowden Phylis Bougie, NP as Nurse Practitioner (Nurse Practitioner) [facility] [place] ([place])    Assessment:   This is a routine wellness examination for Khalil.  Exercise Activities and Dietary recommendations Current Exercise Habits: The patient does not participate in regular exercise at present, Exercise limited by: orthopedic condition(s);neurologic condition(s)  Goals   None     Fall Risk Fall Risk  12/23/2017 10/25/2016 02/27/2016 09/14/2014 05/29/2012  Falls in the past year? No No No No -  Risk for fall due to : - - - - (No Data)  Risk for fall due to: Comment - - - - pt walks w/a walker   Is the patient's home free of loose throw rugs in walkways, pet beds, electrical cords, etc?   yes      Grab bars in the bathroom? yes      Handrails on the stairs?   yes      Adequate lighting?   yes  Depression Screen PHQ 2/9 Scores 12/23/2017 10/25/2016 09/14/2014 01/21/2012  PHQ - 2 Score - - 0 0  PHQ- 9 Score - - - -  Exception Documentation Medical reason Medical reason - -     Cognitive Function MMSE - Mini Mental State Exam 12/23/2017 12/13/2011  Not completed: Unable to complete -  Orientation to time - 1  Orientation to time comments - 1  Orientation to Place - 5  Registration - 3  Attention/ Calculation - 5  Recall - 0  Language- name 2 objects - 2  Language- repeat - 1  Language- follow 3 step command - 2  Language- read & follow direction - 1  Write a sentence - 1  Copy design - 1  Total score - 22     6CIT Screen 10/25/2016  What Year? 4 points  What month? 0 points  What time? 3 points  Count back from 20 4 points  Months in reverse 4 points  Repeat phrase 10 points  Total Score 25    Immunization History  Administered Date(s) Administered  . Influenza Split 02/15/2011, 01/21/2012  .  Influenza Whole 02/05/2007, 01/17/2010, 01/28/2013  . Influenza-Unspecified 06/04/2014, 05/17/2015, 01/15/2016, 02/01/2017  . PPD Test 08/24/2015, 08/14/2016, 11/05/2017, 11/19/2017  . Pneumococcal Polysaccharide-23 04/10/1999  . Td 10/07/2001    Qualifies for Shingles Vaccine? Not in past records  Screening Tests Health Maintenance  Topic Date Due  . INFLUENZA VACCINE  03/07/2018 (Originally 11/07/2017)  . HEMOGLOBIN A1C  03/15/2018  . OPHTHALMOLOGY EXAM  11/13/2018  . FOOT EXAM  11/15/2018  . TETANUS/TDAP  08/09/2024  . DEXA SCAN  Completed  . PNA vac Low Risk Adult  Completed    Cancer Screenings: Lung: Low Dose CT Chest recommended if Age 71-80 years, 30 pack-year currently smoking OR have quit w/in 15years. Patient does not qualify. Breast:  Up to date on Mammogram? Yes   Up to date of Bone Density/Dexa? Yes Colorectal: up to date  Additional Screenings:  Hepatitis C Screening: unable to appropriately accept or decline Flu vaccine due: Will receive [facility]:    I have personally reviewed and addressed the Medicare Annual Wellness questionnaire and have noted the following in the patient's chart:  A. Medical and social history B. Use of alcohol, tobacco or illicit drugs  C. Current medications and supplements D. Functional ability and status E.  Nutritional status F.  Physical activity G. Advance directives H. List of other physicians I.  Hospitalizations, surgeries, and ER visits in previous 12 months J.  [place] to include hearing, vision, cognitive, depression L. Referrals and appointments - none  In addition, I am unable to review and discuss with incapacitated patient certain preventive protocols, quality metrics, and best practice recommendations. A written personalized care plan for preventive services as well as general preventive health recommendations were provided to patient.   See attached scanned questionnaire for  additional information.   Signed,   Tyson Dense, RN Nurse Health Advisor

## 2017-12-24 DIAGNOSIS — R1312 Dysphagia, oropharyngeal phase: Secondary | ICD-10-CM | POA: Diagnosis not present

## 2017-12-24 DIAGNOSIS — F259 Schizoaffective disorder, unspecified: Secondary | ICD-10-CM | POA: Diagnosis not present

## 2017-12-24 DIAGNOSIS — R2681 Unsteadiness on feet: Secondary | ICD-10-CM | POA: Diagnosis not present

## 2017-12-24 DIAGNOSIS — F0150 Vascular dementia without behavioral disturbance: Secondary | ICD-10-CM | POA: Diagnosis not present

## 2017-12-24 DIAGNOSIS — J189 Pneumonia, unspecified organism: Secondary | ICD-10-CM | POA: Diagnosis not present

## 2017-12-24 DIAGNOSIS — F0391 Unspecified dementia with behavioral disturbance: Secondary | ICD-10-CM | POA: Diagnosis not present

## 2017-12-25 DIAGNOSIS — R2681 Unsteadiness on feet: Secondary | ICD-10-CM | POA: Diagnosis not present

## 2017-12-25 DIAGNOSIS — F0391 Unspecified dementia with behavioral disturbance: Secondary | ICD-10-CM | POA: Diagnosis not present

## 2017-12-25 DIAGNOSIS — F259 Schizoaffective disorder, unspecified: Secondary | ICD-10-CM | POA: Diagnosis not present

## 2017-12-25 DIAGNOSIS — J189 Pneumonia, unspecified organism: Secondary | ICD-10-CM | POA: Diagnosis not present

## 2017-12-25 DIAGNOSIS — R1312 Dysphagia, oropharyngeal phase: Secondary | ICD-10-CM | POA: Diagnosis not present

## 2017-12-25 DIAGNOSIS — F0150 Vascular dementia without behavioral disturbance: Secondary | ICD-10-CM | POA: Diagnosis not present

## 2017-12-26 DIAGNOSIS — F259 Schizoaffective disorder, unspecified: Secondary | ICD-10-CM | POA: Diagnosis not present

## 2017-12-26 DIAGNOSIS — R1312 Dysphagia, oropharyngeal phase: Secondary | ICD-10-CM | POA: Diagnosis not present

## 2017-12-26 DIAGNOSIS — F0391 Unspecified dementia with behavioral disturbance: Secondary | ICD-10-CM | POA: Diagnosis not present

## 2017-12-26 DIAGNOSIS — R2681 Unsteadiness on feet: Secondary | ICD-10-CM | POA: Diagnosis not present

## 2017-12-26 DIAGNOSIS — F0150 Vascular dementia without behavioral disturbance: Secondary | ICD-10-CM | POA: Diagnosis not present

## 2017-12-26 DIAGNOSIS — J189 Pneumonia, unspecified organism: Secondary | ICD-10-CM | POA: Diagnosis not present

## 2017-12-27 DIAGNOSIS — R1312 Dysphagia, oropharyngeal phase: Secondary | ICD-10-CM | POA: Diagnosis not present

## 2017-12-27 DIAGNOSIS — R2681 Unsteadiness on feet: Secondary | ICD-10-CM | POA: Diagnosis not present

## 2017-12-27 DIAGNOSIS — F0150 Vascular dementia without behavioral disturbance: Secondary | ICD-10-CM | POA: Diagnosis not present

## 2017-12-27 DIAGNOSIS — J189 Pneumonia, unspecified organism: Secondary | ICD-10-CM | POA: Diagnosis not present

## 2017-12-27 DIAGNOSIS — F0391 Unspecified dementia with behavioral disturbance: Secondary | ICD-10-CM | POA: Diagnosis not present

## 2017-12-27 DIAGNOSIS — F259 Schizoaffective disorder, unspecified: Secondary | ICD-10-CM | POA: Diagnosis not present

## 2017-12-30 DIAGNOSIS — R1312 Dysphagia, oropharyngeal phase: Secondary | ICD-10-CM | POA: Diagnosis not present

## 2017-12-30 DIAGNOSIS — F0391 Unspecified dementia with behavioral disturbance: Secondary | ICD-10-CM | POA: Diagnosis not present

## 2017-12-30 DIAGNOSIS — R2681 Unsteadiness on feet: Secondary | ICD-10-CM | POA: Diagnosis not present

## 2017-12-30 DIAGNOSIS — F0150 Vascular dementia without behavioral disturbance: Secondary | ICD-10-CM | POA: Diagnosis not present

## 2017-12-30 DIAGNOSIS — F259 Schizoaffective disorder, unspecified: Secondary | ICD-10-CM | POA: Diagnosis not present

## 2017-12-30 DIAGNOSIS — J189 Pneumonia, unspecified organism: Secondary | ICD-10-CM | POA: Diagnosis not present

## 2017-12-31 DIAGNOSIS — R2681 Unsteadiness on feet: Secondary | ICD-10-CM | POA: Diagnosis not present

## 2017-12-31 DIAGNOSIS — F259 Schizoaffective disorder, unspecified: Secondary | ICD-10-CM | POA: Diagnosis not present

## 2017-12-31 DIAGNOSIS — F0391 Unspecified dementia with behavioral disturbance: Secondary | ICD-10-CM | POA: Diagnosis not present

## 2017-12-31 DIAGNOSIS — F0150 Vascular dementia without behavioral disturbance: Secondary | ICD-10-CM | POA: Diagnosis not present

## 2017-12-31 DIAGNOSIS — R1312 Dysphagia, oropharyngeal phase: Secondary | ICD-10-CM | POA: Diagnosis not present

## 2017-12-31 DIAGNOSIS — J189 Pneumonia, unspecified organism: Secondary | ICD-10-CM | POA: Diagnosis not present

## 2018-01-01 ENCOUNTER — Encounter (INDEPENDENT_AMBULATORY_CARE_PROVIDER_SITE_OTHER): Payer: Medicare Other | Admitting: Ophthalmology

## 2018-01-01 DIAGNOSIS — H35033 Hypertensive retinopathy, bilateral: Secondary | ICD-10-CM

## 2018-01-01 DIAGNOSIS — I10 Essential (primary) hypertension: Secondary | ICD-10-CM | POA: Diagnosis not present

## 2018-01-01 DIAGNOSIS — F0150 Vascular dementia without behavioral disturbance: Secondary | ICD-10-CM | POA: Diagnosis not present

## 2018-01-01 DIAGNOSIS — H43813 Vitreous degeneration, bilateral: Secondary | ICD-10-CM | POA: Diagnosis not present

## 2018-01-01 DIAGNOSIS — H353231 Exudative age-related macular degeneration, bilateral, with active choroidal neovascularization: Secondary | ICD-10-CM

## 2018-01-01 DIAGNOSIS — H1033 Unspecified acute conjunctivitis, bilateral: Secondary | ICD-10-CM | POA: Diagnosis not present

## 2018-01-01 DIAGNOSIS — J189 Pneumonia, unspecified organism: Secondary | ICD-10-CM | POA: Diagnosis not present

## 2018-01-01 DIAGNOSIS — F259 Schizoaffective disorder, unspecified: Secondary | ICD-10-CM | POA: Diagnosis not present

## 2018-01-01 DIAGNOSIS — R2681 Unsteadiness on feet: Secondary | ICD-10-CM | POA: Diagnosis not present

## 2018-01-01 DIAGNOSIS — F0391 Unspecified dementia with behavioral disturbance: Secondary | ICD-10-CM | POA: Diagnosis not present

## 2018-01-01 DIAGNOSIS — R1312 Dysphagia, oropharyngeal phase: Secondary | ICD-10-CM | POA: Diagnosis not present

## 2018-01-02 ENCOUNTER — Non-Acute Institutional Stay (SKILLED_NURSING_FACILITY): Payer: Medicare Other | Admitting: Adult Health

## 2018-01-02 ENCOUNTER — Encounter: Payer: Self-pay | Admitting: Adult Health

## 2018-01-02 DIAGNOSIS — R2681 Unsteadiness on feet: Secondary | ICD-10-CM | POA: Diagnosis not present

## 2018-01-02 DIAGNOSIS — G459 Transient cerebral ischemic attack, unspecified: Secondary | ICD-10-CM

## 2018-01-02 DIAGNOSIS — F0150 Vascular dementia without behavioral disturbance: Secondary | ICD-10-CM | POA: Diagnosis not present

## 2018-01-02 DIAGNOSIS — J189 Pneumonia, unspecified organism: Secondary | ICD-10-CM | POA: Diagnosis not present

## 2018-01-02 DIAGNOSIS — F209 Schizophrenia, unspecified: Secondary | ICD-10-CM

## 2018-01-02 DIAGNOSIS — I10 Essential (primary) hypertension: Secondary | ICD-10-CM

## 2018-01-02 DIAGNOSIS — J45998 Other asthma: Secondary | ICD-10-CM

## 2018-01-02 DIAGNOSIS — F259 Schizoaffective disorder, unspecified: Secondary | ICD-10-CM | POA: Diagnosis not present

## 2018-01-02 DIAGNOSIS — R1312 Dysphagia, oropharyngeal phase: Secondary | ICD-10-CM | POA: Diagnosis not present

## 2018-01-02 DIAGNOSIS — F0391 Unspecified dementia with behavioral disturbance: Secondary | ICD-10-CM | POA: Diagnosis not present

## 2018-01-02 NOTE — Progress Notes (Signed)
Location:   [facility] Room Number: 225 B Place of Service:  SNF (31)   CODE STATUS: Full Code  Allergies  Allergen Reactions  . Ace Inhibitors     Unknown reaction per MAR   . Ether Nausea And Vomiting    Chief Complaint  Patient presents with  . Medical Management of Chronic Issues    TIA; hypertension; allergic bronchitis; schizophrenia    HPI:  She is a 84 year old long term resident of this facility being seen for the management of her chronic illnesses: tia; hypertension; allergic bronchitis; schizophrenia. She denies any headaches; no anxiety no depressive feelings; no changes in appetite.   Past Medical History:  Diagnosis Date  . Allergic rhinitis   . CATARACT 06/06/2006   Qualifier: Diagnosis of  By: Genene Churn MD, Janett Billow    . Controlled diabetes mellitus type II without complication ([place]) 9/38/1017   Qualifier: Diagnosis of  By: Genene Churn MD, Janett Billow    . Depressive disorder   . Diabetes mellitus type 2 in obese ([place])   . Essential hypertension, benign 02/24/2015  . Fungal dermatitis   . Hyperlipidemia   . Macular degeneration   . Mild cognitive impairment   . Narcissism ([place])   . Normocytic anemia   . Phobia   . Schizophrenia, paranoid ([place])   . Stress incontinence, female     Past Surgical History:  Procedure Laterality Date  . ABDOMINAL HYSTERECTOMY     due to fibroids  . Avastin Ingection Right 03/26/2017   In Right eye  . CATARACT EXTRACTION  6 16 2006  . CRYOTHERAPY  2 15 2006   facial AK  . hyperplastic   4 20 2006   AK shave biopsy  . intraocular injection  [id number]  . TOTAL ABDOMINAL HYSTERECTOMY W/ BILATERAL SALPINGOOPHORECTOMY     due to endometriosis  non malignant    Social History   Socioeconomic History  . Marital status: Divorced    Spouse name: Not on file  . Number of children: Not on file  . Years of education: Not on file  . Highest education level: Not on file  Occupational History  . Not on file    Social Needs  . Financial resource strain: Not on file  . Food insecurity:    Worry: Not on file    Inability: Not on file  . Transportation needs:    Medical: Not on file    Non-medical: Not on file  Tobacco Use  . Smoking status: Never Smoker  . Smokeless tobacco: Never Used  Substance and Sexual Activity  . Alcohol use: No  . Drug use: No  . Sexual activity: Not on file  Lifestyle  . Physical activity:    Days per week: Not on file    Minutes per session: Not on file  . Stress: Not on file  Relationships  . Social connections:    Talks on phone: Not on file    Gets together: Not on file    Attends religious service: Not on file    Active member of club or organization: Not on file    Attends meetings of clubs or organizations: Not on file    Relationship status: Not on file  . Intimate partner violence:    Fear of current or ex partner: Not on file    Emotionally abused: Not on file    Physically abused: Not on file    Forced sexual activity: Not on file  Other  Topics Concern  . Not on file  Social History Narrative  . Not on file   Family History  Problem Relation Age of Onset  . Heart disease Mother   . Heart disease Father   . Stroke Father   . Cancer Cousin        liver and colon  . Stroke Sister       VITAL SIGNS Pulse (!) 52   Temp 98.2 F (36.8 C)   Resp 20   Ht 4\' 9"  (1.448 m)   Wt 155 lb 3.2 oz (70.4 kg)   SpO2 93%   BMI 33.58 kg/m   Outpatient Encounter Medications as of 01/02/2018  Medication Sig  . aspirin EC 325 MG tablet Take 1 tablet (325 mg total) by mouth daily.  Marland Kitchen atorvastatin (LIPITOR) 20 MG tablet Take 1 tablet (20 mg total) by mouth at bedtime.  . bacitracin-polymyxin b, ophth, (POLYSPORIN) OINT Place 1 application into both eyes at bedtime.  . benztropine (COGENTIN) 1 MG tablet Take 1 mg by mouth at bedtime.  . Calcium Carbonate-Vitamin D (CALCIUM 600-D) 600-400 MG-UNIT per tablet Take 1 tablet by mouth at bedtime.   .  cholecalciferol (VITAMIN D) 1000 UNITS tablet Take 1,000 Units by mouth at bedtime.   . ciprofloxacin (CILOXAN) 0.3 % ophthalmic ointment Place 1 drop into both eyes 4 (four) times daily. X 2 weeks, ending on 01/15/18  . cyanocobalamin 1000 MCG tablet Take 1,000 mcg by mouth daily with breakfast.   . docusate sodium (COLACE) 100 MG capsule Take 100 mg by mouth daily.  Marland Kitchen donepezil (ARICEPT) 5 MG tablet Take 5 mg by mouth at bedtime.   Marland Kitchen escitalopram (LEXAPRO) 20 MG tablet Take 20 mg by mouth daily with breakfast.   . Hypromellose 0.4 % SOLN Place 1 drop into both eyes 3 (three) times daily.  . insulin aspart (NOVOLOG) 100 UNIT/ML injection 0 - 70 = 0 units 71 - 400 = 8 units Notify MD if BS is less than 70 or greater than 400. Hold Insulin if BS is less than 70  . Insulin Glargine (LANTUS SOLOSTAR) 100 UNIT/ML Solostar Pen Inject 20 Units into the skin daily.  Marland Kitchen lip balm (CARMEX) ointment 1 application. Apply to lips 4 times daily for cold sore  . loratadine (CLARITIN) 10 MG tablet Take 10 mg by mouth daily with breakfast.   . losartan (COZAAR) 50 MG tablet Take 50 mg by mouth daily with breakfast.   . Lotion Base LOTN Apply 1 application topically 2 (two) times daily.  . Memantine HCl ER (NAMENDA XR) 28 MG CP24 Take 28 mg by mouth daily with breakfast. For dementia  . Multiple Vitamin (MULTIVITAMIN) tablet Take 1 tablet by mouth daily.  . Nutritional Supplements (NUTRITIONAL SUPPLEMENT PO) Regular Diet - Pureed texture, Nectar Thickened fluids consistency.  Large portions  . OLANZapine (ZYPREXA) 5 MG tablet Take 5 mg by mouth at bedtime.  Marland Kitchen omeprazole (PRILOSEC) 20 MG capsule Take 20 mg by mouth daily at 6 (six) AM.  . white petrolatum (VASELINE) GEL Apply to left forehead every shift  . [DISCONTINUED] acetaminophen (TYLENOL) 325 MG tablet Take 650 mg by mouth every 6 (six) hours as needed for mild pain, moderate pain or fever.   . [DISCONTINUED] Nutritional Supplements (NUTRITIONAL SUPPLEMENT  PO) Frozen Nutritional treat - 4 oz two times daily for weight support   No facility-administered encounter medications on file as of 01/02/2018.      SIGNIFICANT DIAGNOSTIC EXAMS  PREVIOUS  12-30-14: right eye intraocular injection     03-12-16: EEG: This is a mildly abnormal EEG due to the presence of  moderate generalized slowing which is indicative of bihemispheric dysfunction which is a nonspecific finding seen in a variety of degenerative, hypoxic, ischemic, toxic, metabolic etiologies. No definite epileptiform activity is noted  12-03-16: chest x-ray: no acute cardiopulmonary disease  01-10-17: chest x-ray: no acute cardiopulmonary disease  04-02-17: ct of head:  1. No acute intracranial pathology seen on CT. 2. Mild to moderate cortical volume loss and scattered small vessel ischemic microangiopathy. 3. Partial opacification of the mastoid air cells bilaterally.  04-02-17: ct angio of head and neck: Minor extracranial and intracranial atheromatous change, not unexpected for age, and stable from 2017. No large vessel occlusion, dissection, or other significant vascular finding. Atrophy and small vessel disease without acute intracranial process.  04-02-17: MRI of brain: Atrophy and small vessel disease. No acute intracranial findings.  NO NEW EXAMS      LABS REVIEWED: PREVIOUS    03-29-17: chol 162; ldl 75 trig 214; hdl 44; hgb a1c 9.4 04-02-17: wbc 8.5; hgb 14.2; hct 44.1; mcv 86.0; plt 213; glucose 175; bun 20; creat 1.10; k+ 4.2; na++ 138; ca 9.0; liver normal albumin 3.1; ammonia 14 04-04-17: wbc 8.7; hgb 11.1; hct 34.0; mcv 85.4; plt 188; glucose 97; bun 16; creat 0.87; k+ 3.7; na++137; ca 8.1 05-10-17: wbc 7.3; hgb 12.7; hct 39.7; mcv 85.3; plt 189; glucose 210; bun 24.2; creat 0.92; k+ 4.4; na++ 140; ca 9.2  09-13-17: chol 183; ldl 100; trig 194; hdl 44; hgb a1c 7.6   NO NEW LABS.    Review of Systems  Constitutional: Negative for malaise/fatigue.  Eyes:       Are  feeling better   Respiratory: Negative for cough and shortness of breath.   Cardiovascular: Negative for chest pain, palpitations and leg swelling.  Gastrointestinal: Negative for abdominal pain, constipation and heartburn.  Musculoskeletal: Negative for back pain, joint pain and myalgias.  Skin: Negative.   Neurological: Negative for dizziness.  Psychiatric/Behavioral: The patient is not nervous/anxious.    Physical Exam  Constitutional: She appears well-developed and well-nourished. No distress.  Neck: No thyromegaly present.  Cardiovascular: Normal rate, regular rhythm and intact distal pulses.  Murmur heard. 1/6  Pulmonary/Chest: Effort normal and breath sounds normal. No respiratory distress.  Abdominal: Soft. Bowel sounds are normal. She exhibits no distension. There is no tenderness.  Musculoskeletal: She exhibits no edema.  Is able to move all extremities Uses wheelchair   Lymphadenopathy:    She has no cervical adenopathy.  Neurological: She is alert.  Skin: Skin is warm and dry. She is not diaphoretic.  Psychiatric: She has a normal mood and affect.     ASSESSMENT/ PLAN:  TODAY:   1. Schizophrenia: is stable continue  zyprexa 5  mg nightly and will monitor   Is followed by psych services.  Will continue cogentin 1 mg nightly for tardive dyskinesia and will monitor   2. Chronic allergic bronchitis: will continue claritin 10 mg daily  3. TIA: is presently stable; will continue asa 325 mg daily   4. Essential hypertension benign: b/p 142/60  is stable : will continue cozaar 50 mg daily    PREVIOUS    5. Dementia without behavior disturbance; is without change will continue aricept 5 mg daily and namenda xr 28 mg daily will not make changes will monitor   Her current weight is 155 pounds and is currently stable.  6. Tardive dyskinesia: stable  she does not have a resting tremor or lip tremor . Will continue cogentin 1 mg nightly   7. Hyperlipidemia associated  with type 2 diabetes mellitus: stable  ldl 75; trig 214  will continue lipitor 20 mg daily   8. Controlled diabetes type II without complication with long term insulin use:  cbgs are slightly elevated   hgb a1c 6.9 (previous 7.2)  Will continue  novolog  8 units with meals lantus 20 units nightly    Is on  statin and arb, asa   9. Dysphagia, oropharyngeal phase:stable she is on nectar thick liquids; no signs of aspiration present   10. Gerd without esophagitis: stable will continue prilosec 20  mg daily   11. Chronic constipation stable : will continue colace daily    12. Depressive dissorder: stable  will continue lexapro 20 mg daily will monitor    MD is aware of resident's narcotic use and is in agreement with current plan of care. We will attempt to wean resident as apropriate   Ok Edwards NP [facility]  Contact [phone] Monday through Friday 8am- 5pm  After hours call [phone]

## 2018-01-03 DIAGNOSIS — J189 Pneumonia, unspecified organism: Secondary | ICD-10-CM | POA: Diagnosis not present

## 2018-01-03 DIAGNOSIS — F0151 Vascular dementia with behavioral disturbance: Secondary | ICD-10-CM | POA: Diagnosis not present

## 2018-01-03 DIAGNOSIS — F251 Schizoaffective disorder, depressive type: Secondary | ICD-10-CM | POA: Diagnosis not present

## 2018-01-03 DIAGNOSIS — R2681 Unsteadiness on feet: Secondary | ICD-10-CM | POA: Diagnosis not present

## 2018-01-03 DIAGNOSIS — R1312 Dysphagia, oropharyngeal phase: Secondary | ICD-10-CM | POA: Diagnosis not present

## 2018-01-03 DIAGNOSIS — F0391 Unspecified dementia with behavioral disturbance: Secondary | ICD-10-CM | POA: Diagnosis not present

## 2018-01-03 DIAGNOSIS — F259 Schizoaffective disorder, unspecified: Secondary | ICD-10-CM | POA: Diagnosis not present

## 2018-01-03 DIAGNOSIS — F0150 Vascular dementia without behavioral disturbance: Secondary | ICD-10-CM | POA: Diagnosis not present

## 2018-01-04 DIAGNOSIS — E119 Type 2 diabetes mellitus without complications: Secondary | ICD-10-CM | POA: Diagnosis not present

## 2018-01-04 LAB — HEMOGLOBIN A1C: Hemoglobin A1C: 7.7

## 2018-01-05 DIAGNOSIS — F0391 Unspecified dementia with behavioral disturbance: Secondary | ICD-10-CM | POA: Diagnosis not present

## 2018-01-05 DIAGNOSIS — R1312 Dysphagia, oropharyngeal phase: Secondary | ICD-10-CM | POA: Diagnosis not present

## 2018-01-05 DIAGNOSIS — J189 Pneumonia, unspecified organism: Secondary | ICD-10-CM | POA: Diagnosis not present

## 2018-01-05 DIAGNOSIS — F259 Schizoaffective disorder, unspecified: Secondary | ICD-10-CM | POA: Diagnosis not present

## 2018-01-05 DIAGNOSIS — F0150 Vascular dementia without behavioral disturbance: Secondary | ICD-10-CM | POA: Diagnosis not present

## 2018-01-05 DIAGNOSIS — R2681 Unsteadiness on feet: Secondary | ICD-10-CM | POA: Diagnosis not present

## 2018-01-06 DIAGNOSIS — R1312 Dysphagia, oropharyngeal phase: Secondary | ICD-10-CM | POA: Diagnosis not present

## 2018-01-06 DIAGNOSIS — J189 Pneumonia, unspecified organism: Secondary | ICD-10-CM | POA: Diagnosis not present

## 2018-01-06 DIAGNOSIS — R2681 Unsteadiness on feet: Secondary | ICD-10-CM | POA: Diagnosis not present

## 2018-01-06 DIAGNOSIS — F0150 Vascular dementia without behavioral disturbance: Secondary | ICD-10-CM | POA: Diagnosis not present

## 2018-01-06 DIAGNOSIS — F259 Schizoaffective disorder, unspecified: Secondary | ICD-10-CM | POA: Diagnosis not present

## 2018-01-06 DIAGNOSIS — F0391 Unspecified dementia with behavioral disturbance: Secondary | ICD-10-CM | POA: Diagnosis not present

## 2018-01-07 DIAGNOSIS — Z23 Encounter for immunization: Secondary | ICD-10-CM | POA: Diagnosis not present

## 2018-01-07 DIAGNOSIS — R1312 Dysphagia, oropharyngeal phase: Secondary | ICD-10-CM | POA: Diagnosis not present

## 2018-01-07 DIAGNOSIS — R2681 Unsteadiness on feet: Secondary | ICD-10-CM | POA: Diagnosis not present

## 2018-01-07 DIAGNOSIS — Z741 Need for assistance with personal care: Secondary | ICD-10-CM | POA: Diagnosis not present

## 2018-01-07 DIAGNOSIS — M6281 Muscle weakness (generalized): Secondary | ICD-10-CM | POA: Diagnosis not present

## 2018-01-07 DIAGNOSIS — J189 Pneumonia, unspecified organism: Secondary | ICD-10-CM | POA: Diagnosis not present

## 2018-01-07 DIAGNOSIS — F0391 Unspecified dementia with behavioral disturbance: Secondary | ICD-10-CM | POA: Diagnosis not present

## 2018-01-07 DIAGNOSIS — R278 Other lack of coordination: Secondary | ICD-10-CM | POA: Diagnosis not present

## 2018-01-07 DIAGNOSIS — F0150 Vascular dementia without behavioral disturbance: Secondary | ICD-10-CM | POA: Diagnosis not present

## 2018-01-07 DIAGNOSIS — F259 Schizoaffective disorder, unspecified: Secondary | ICD-10-CM | POA: Diagnosis not present

## 2018-01-08 DIAGNOSIS — R1312 Dysphagia, oropharyngeal phase: Secondary | ICD-10-CM | POA: Diagnosis not present

## 2018-01-08 DIAGNOSIS — R2681 Unsteadiness on feet: Secondary | ICD-10-CM | POA: Diagnosis not present

## 2018-01-08 DIAGNOSIS — Z741 Need for assistance with personal care: Secondary | ICD-10-CM | POA: Diagnosis not present

## 2018-01-08 DIAGNOSIS — F0150 Vascular dementia without behavioral disturbance: Secondary | ICD-10-CM | POA: Diagnosis not present

## 2018-01-08 DIAGNOSIS — F259 Schizoaffective disorder, unspecified: Secondary | ICD-10-CM | POA: Diagnosis not present

## 2018-01-08 DIAGNOSIS — Z23 Encounter for immunization: Secondary | ICD-10-CM | POA: Diagnosis not present

## 2018-01-08 DIAGNOSIS — F0391 Unspecified dementia with behavioral disturbance: Secondary | ICD-10-CM | POA: Diagnosis not present

## 2018-01-09 DIAGNOSIS — F0391 Unspecified dementia with behavioral disturbance: Secondary | ICD-10-CM | POA: Diagnosis not present

## 2018-01-09 DIAGNOSIS — Z741 Need for assistance with personal care: Secondary | ICD-10-CM | POA: Diagnosis not present

## 2018-01-09 DIAGNOSIS — F0150 Vascular dementia without behavioral disturbance: Secondary | ICD-10-CM | POA: Diagnosis not present

## 2018-01-09 DIAGNOSIS — F259 Schizoaffective disorder, unspecified: Secondary | ICD-10-CM | POA: Diagnosis not present

## 2018-01-09 DIAGNOSIS — Z23 Encounter for immunization: Secondary | ICD-10-CM | POA: Diagnosis not present

## 2018-01-09 DIAGNOSIS — R2681 Unsteadiness on feet: Secondary | ICD-10-CM | POA: Diagnosis not present

## 2018-01-09 DIAGNOSIS — R1312 Dysphagia, oropharyngeal phase: Secondary | ICD-10-CM | POA: Diagnosis not present

## 2018-01-10 DIAGNOSIS — Z23 Encounter for immunization: Secondary | ICD-10-CM | POA: Diagnosis not present

## 2018-01-10 DIAGNOSIS — R1312 Dysphagia, oropharyngeal phase: Secondary | ICD-10-CM | POA: Diagnosis not present

## 2018-01-10 DIAGNOSIS — F259 Schizoaffective disorder, unspecified: Secondary | ICD-10-CM | POA: Diagnosis not present

## 2018-01-10 DIAGNOSIS — F0391 Unspecified dementia with behavioral disturbance: Secondary | ICD-10-CM | POA: Diagnosis not present

## 2018-01-10 DIAGNOSIS — F0150 Vascular dementia without behavioral disturbance: Secondary | ICD-10-CM | POA: Diagnosis not present

## 2018-01-10 DIAGNOSIS — Z741 Need for assistance with personal care: Secondary | ICD-10-CM | POA: Diagnosis not present

## 2018-01-10 DIAGNOSIS — R2681 Unsteadiness on feet: Secondary | ICD-10-CM | POA: Diagnosis not present

## 2018-01-11 DIAGNOSIS — R2681 Unsteadiness on feet: Secondary | ICD-10-CM | POA: Diagnosis not present

## 2018-01-11 DIAGNOSIS — Z23 Encounter for immunization: Secondary | ICD-10-CM | POA: Diagnosis not present

## 2018-01-11 DIAGNOSIS — R1312 Dysphagia, oropharyngeal phase: Secondary | ICD-10-CM | POA: Diagnosis not present

## 2018-01-11 DIAGNOSIS — Z741 Need for assistance with personal care: Secondary | ICD-10-CM | POA: Diagnosis not present

## 2018-01-11 DIAGNOSIS — F0391 Unspecified dementia with behavioral disturbance: Secondary | ICD-10-CM | POA: Diagnosis not present

## 2018-01-11 DIAGNOSIS — F259 Schizoaffective disorder, unspecified: Secondary | ICD-10-CM | POA: Diagnosis not present

## 2018-01-11 DIAGNOSIS — F0150 Vascular dementia without behavioral disturbance: Secondary | ICD-10-CM | POA: Diagnosis not present

## 2018-01-13 DIAGNOSIS — F0150 Vascular dementia without behavioral disturbance: Secondary | ICD-10-CM | POA: Diagnosis not present

## 2018-01-13 DIAGNOSIS — F0391 Unspecified dementia with behavioral disturbance: Secondary | ICD-10-CM | POA: Diagnosis not present

## 2018-01-13 DIAGNOSIS — Z23 Encounter for immunization: Secondary | ICD-10-CM | POA: Diagnosis not present

## 2018-01-13 DIAGNOSIS — Z741 Need for assistance with personal care: Secondary | ICD-10-CM | POA: Diagnosis not present

## 2018-01-13 DIAGNOSIS — F259 Schizoaffective disorder, unspecified: Secondary | ICD-10-CM | POA: Diagnosis not present

## 2018-01-13 DIAGNOSIS — R2681 Unsteadiness on feet: Secondary | ICD-10-CM | POA: Diagnosis not present

## 2018-01-13 DIAGNOSIS — R1312 Dysphagia, oropharyngeal phase: Secondary | ICD-10-CM | POA: Diagnosis not present

## 2018-01-14 DIAGNOSIS — Z741 Need for assistance with personal care: Secondary | ICD-10-CM | POA: Diagnosis not present

## 2018-01-14 DIAGNOSIS — R2681 Unsteadiness on feet: Secondary | ICD-10-CM | POA: Diagnosis not present

## 2018-01-14 DIAGNOSIS — F0391 Unspecified dementia with behavioral disturbance: Secondary | ICD-10-CM | POA: Diagnosis not present

## 2018-01-14 DIAGNOSIS — R1312 Dysphagia, oropharyngeal phase: Secondary | ICD-10-CM | POA: Diagnosis not present

## 2018-01-14 DIAGNOSIS — F259 Schizoaffective disorder, unspecified: Secondary | ICD-10-CM | POA: Diagnosis not present

## 2018-01-14 DIAGNOSIS — F0150 Vascular dementia without behavioral disturbance: Secondary | ICD-10-CM | POA: Diagnosis not present

## 2018-01-14 DIAGNOSIS — Z23 Encounter for immunization: Secondary | ICD-10-CM | POA: Diagnosis not present

## 2018-01-15 ENCOUNTER — Encounter (INDEPENDENT_AMBULATORY_CARE_PROVIDER_SITE_OTHER): Payer: Medicare Other | Admitting: Ophthalmology

## 2018-01-15 DIAGNOSIS — H353231 Exudative age-related macular degeneration, bilateral, with active choroidal neovascularization: Secondary | ICD-10-CM | POA: Diagnosis not present

## 2018-01-15 DIAGNOSIS — H35033 Hypertensive retinopathy, bilateral: Secondary | ICD-10-CM | POA: Diagnosis not present

## 2018-01-15 DIAGNOSIS — Z741 Need for assistance with personal care: Secondary | ICD-10-CM | POA: Diagnosis not present

## 2018-01-15 DIAGNOSIS — I10 Essential (primary) hypertension: Secondary | ICD-10-CM | POA: Diagnosis not present

## 2018-01-15 DIAGNOSIS — H43813 Vitreous degeneration, bilateral: Secondary | ICD-10-CM | POA: Diagnosis not present

## 2018-01-15 DIAGNOSIS — F0150 Vascular dementia without behavioral disturbance: Secondary | ICD-10-CM | POA: Diagnosis not present

## 2018-01-15 DIAGNOSIS — R1312 Dysphagia, oropharyngeal phase: Secondary | ICD-10-CM | POA: Diagnosis not present

## 2018-01-15 DIAGNOSIS — R2681 Unsteadiness on feet: Secondary | ICD-10-CM | POA: Diagnosis not present

## 2018-01-15 DIAGNOSIS — Z23 Encounter for immunization: Secondary | ICD-10-CM | POA: Diagnosis not present

## 2018-01-15 DIAGNOSIS — F259 Schizoaffective disorder, unspecified: Secondary | ICD-10-CM | POA: Diagnosis not present

## 2018-01-15 DIAGNOSIS — F0391 Unspecified dementia with behavioral disturbance: Secondary | ICD-10-CM | POA: Diagnosis not present

## 2018-01-16 DIAGNOSIS — F0391 Unspecified dementia with behavioral disturbance: Secondary | ICD-10-CM | POA: Diagnosis not present

## 2018-01-16 DIAGNOSIS — Z741 Need for assistance with personal care: Secondary | ICD-10-CM | POA: Diagnosis not present

## 2018-01-16 DIAGNOSIS — F0150 Vascular dementia without behavioral disturbance: Secondary | ICD-10-CM | POA: Diagnosis not present

## 2018-01-16 DIAGNOSIS — R1312 Dysphagia, oropharyngeal phase: Secondary | ICD-10-CM | POA: Diagnosis not present

## 2018-01-16 DIAGNOSIS — F259 Schizoaffective disorder, unspecified: Secondary | ICD-10-CM | POA: Diagnosis not present

## 2018-01-16 DIAGNOSIS — Z23 Encounter for immunization: Secondary | ICD-10-CM | POA: Diagnosis not present

## 2018-01-16 DIAGNOSIS — R2681 Unsteadiness on feet: Secondary | ICD-10-CM | POA: Diagnosis not present

## 2018-01-17 DIAGNOSIS — F0391 Unspecified dementia with behavioral disturbance: Secondary | ICD-10-CM | POA: Diagnosis not present

## 2018-01-17 DIAGNOSIS — F259 Schizoaffective disorder, unspecified: Secondary | ICD-10-CM | POA: Diagnosis not present

## 2018-01-17 DIAGNOSIS — Z741 Need for assistance with personal care: Secondary | ICD-10-CM | POA: Diagnosis not present

## 2018-01-17 DIAGNOSIS — R2681 Unsteadiness on feet: Secondary | ICD-10-CM | POA: Diagnosis not present

## 2018-01-17 DIAGNOSIS — F0150 Vascular dementia without behavioral disturbance: Secondary | ICD-10-CM | POA: Diagnosis not present

## 2018-01-17 DIAGNOSIS — R1312 Dysphagia, oropharyngeal phase: Secondary | ICD-10-CM | POA: Diagnosis not present

## 2018-01-17 DIAGNOSIS — Z23 Encounter for immunization: Secondary | ICD-10-CM | POA: Diagnosis not present

## 2018-01-20 DIAGNOSIS — R1312 Dysphagia, oropharyngeal phase: Secondary | ICD-10-CM | POA: Diagnosis not present

## 2018-01-20 DIAGNOSIS — Z23 Encounter for immunization: Secondary | ICD-10-CM | POA: Diagnosis not present

## 2018-01-20 DIAGNOSIS — Z741 Need for assistance with personal care: Secondary | ICD-10-CM | POA: Diagnosis not present

## 2018-01-20 DIAGNOSIS — F0150 Vascular dementia without behavioral disturbance: Secondary | ICD-10-CM | POA: Diagnosis not present

## 2018-01-20 DIAGNOSIS — F0391 Unspecified dementia with behavioral disturbance: Secondary | ICD-10-CM | POA: Diagnosis not present

## 2018-01-20 DIAGNOSIS — R2681 Unsteadiness on feet: Secondary | ICD-10-CM | POA: Diagnosis not present

## 2018-01-20 DIAGNOSIS — F259 Schizoaffective disorder, unspecified: Secondary | ICD-10-CM | POA: Diagnosis not present

## 2018-01-21 DIAGNOSIS — F259 Schizoaffective disorder, unspecified: Secondary | ICD-10-CM | POA: Diagnosis not present

## 2018-01-21 DIAGNOSIS — Z23 Encounter for immunization: Secondary | ICD-10-CM | POA: Diagnosis not present

## 2018-01-21 DIAGNOSIS — F0391 Unspecified dementia with behavioral disturbance: Secondary | ICD-10-CM | POA: Diagnosis not present

## 2018-01-21 DIAGNOSIS — Z741 Need for assistance with personal care: Secondary | ICD-10-CM | POA: Diagnosis not present

## 2018-01-21 DIAGNOSIS — F0150 Vascular dementia without behavioral disturbance: Secondary | ICD-10-CM | POA: Diagnosis not present

## 2018-01-21 DIAGNOSIS — R2681 Unsteadiness on feet: Secondary | ICD-10-CM | POA: Diagnosis not present

## 2018-01-21 DIAGNOSIS — R1312 Dysphagia, oropharyngeal phase: Secondary | ICD-10-CM | POA: Diagnosis not present

## 2018-01-22 DIAGNOSIS — R1312 Dysphagia, oropharyngeal phase: Secondary | ICD-10-CM | POA: Diagnosis not present

## 2018-01-22 DIAGNOSIS — Z741 Need for assistance with personal care: Secondary | ICD-10-CM | POA: Diagnosis not present

## 2018-01-22 DIAGNOSIS — R2681 Unsteadiness on feet: Secondary | ICD-10-CM | POA: Diagnosis not present

## 2018-01-22 DIAGNOSIS — F0150 Vascular dementia without behavioral disturbance: Secondary | ICD-10-CM | POA: Diagnosis not present

## 2018-01-22 DIAGNOSIS — F0391 Unspecified dementia with behavioral disturbance: Secondary | ICD-10-CM | POA: Diagnosis not present

## 2018-01-22 DIAGNOSIS — Z23 Encounter for immunization: Secondary | ICD-10-CM | POA: Diagnosis not present

## 2018-01-22 DIAGNOSIS — F259 Schizoaffective disorder, unspecified: Secondary | ICD-10-CM | POA: Diagnosis not present

## 2018-01-23 DIAGNOSIS — F259 Schizoaffective disorder, unspecified: Secondary | ICD-10-CM | POA: Diagnosis not present

## 2018-01-23 DIAGNOSIS — R1312 Dysphagia, oropharyngeal phase: Secondary | ICD-10-CM | POA: Diagnosis not present

## 2018-01-23 DIAGNOSIS — R2681 Unsteadiness on feet: Secondary | ICD-10-CM | POA: Diagnosis not present

## 2018-01-23 DIAGNOSIS — F0150 Vascular dementia without behavioral disturbance: Secondary | ICD-10-CM | POA: Diagnosis not present

## 2018-01-23 DIAGNOSIS — Z23 Encounter for immunization: Secondary | ICD-10-CM | POA: Diagnosis not present

## 2018-01-23 DIAGNOSIS — Z741 Need for assistance with personal care: Secondary | ICD-10-CM | POA: Diagnosis not present

## 2018-01-23 DIAGNOSIS — F0391 Unspecified dementia with behavioral disturbance: Secondary | ICD-10-CM | POA: Diagnosis not present

## 2018-01-25 DIAGNOSIS — Z23 Encounter for immunization: Secondary | ICD-10-CM | POA: Diagnosis not present

## 2018-01-25 DIAGNOSIS — F0391 Unspecified dementia with behavioral disturbance: Secondary | ICD-10-CM | POA: Diagnosis not present

## 2018-01-25 DIAGNOSIS — R2681 Unsteadiness on feet: Secondary | ICD-10-CM | POA: Diagnosis not present

## 2018-01-25 DIAGNOSIS — R1312 Dysphagia, oropharyngeal phase: Secondary | ICD-10-CM | POA: Diagnosis not present

## 2018-01-25 DIAGNOSIS — F259 Schizoaffective disorder, unspecified: Secondary | ICD-10-CM | POA: Diagnosis not present

## 2018-01-25 DIAGNOSIS — F0150 Vascular dementia without behavioral disturbance: Secondary | ICD-10-CM | POA: Diagnosis not present

## 2018-01-25 DIAGNOSIS — Z741 Need for assistance with personal care: Secondary | ICD-10-CM | POA: Diagnosis not present

## 2018-01-27 DIAGNOSIS — Z741 Need for assistance with personal care: Secondary | ICD-10-CM | POA: Diagnosis not present

## 2018-01-27 DIAGNOSIS — R1312 Dysphagia, oropharyngeal phase: Secondary | ICD-10-CM | POA: Diagnosis not present

## 2018-01-27 DIAGNOSIS — Z23 Encounter for immunization: Secondary | ICD-10-CM | POA: Diagnosis not present

## 2018-01-27 DIAGNOSIS — F259 Schizoaffective disorder, unspecified: Secondary | ICD-10-CM | POA: Diagnosis not present

## 2018-01-27 DIAGNOSIS — F0391 Unspecified dementia with behavioral disturbance: Secondary | ICD-10-CM | POA: Diagnosis not present

## 2018-01-27 DIAGNOSIS — R2681 Unsteadiness on feet: Secondary | ICD-10-CM | POA: Diagnosis not present

## 2018-01-27 DIAGNOSIS — F0150 Vascular dementia without behavioral disturbance: Secondary | ICD-10-CM | POA: Diagnosis not present

## 2018-01-28 DIAGNOSIS — Z23 Encounter for immunization: Secondary | ICD-10-CM | POA: Diagnosis not present

## 2018-01-28 DIAGNOSIS — F0391 Unspecified dementia with behavioral disturbance: Secondary | ICD-10-CM | POA: Diagnosis not present

## 2018-01-28 DIAGNOSIS — Z741 Need for assistance with personal care: Secondary | ICD-10-CM | POA: Diagnosis not present

## 2018-01-28 DIAGNOSIS — F0150 Vascular dementia without behavioral disturbance: Secondary | ICD-10-CM | POA: Diagnosis not present

## 2018-01-28 DIAGNOSIS — R1312 Dysphagia, oropharyngeal phase: Secondary | ICD-10-CM | POA: Diagnosis not present

## 2018-01-28 DIAGNOSIS — F259 Schizoaffective disorder, unspecified: Secondary | ICD-10-CM | POA: Diagnosis not present

## 2018-01-28 DIAGNOSIS — R2681 Unsteadiness on feet: Secondary | ICD-10-CM | POA: Diagnosis not present

## 2018-01-29 DIAGNOSIS — F259 Schizoaffective disorder, unspecified: Secondary | ICD-10-CM | POA: Diagnosis not present

## 2018-01-29 DIAGNOSIS — R2681 Unsteadiness on feet: Secondary | ICD-10-CM | POA: Diagnosis not present

## 2018-01-29 DIAGNOSIS — Z741 Need for assistance with personal care: Secondary | ICD-10-CM | POA: Diagnosis not present

## 2018-01-29 DIAGNOSIS — F0150 Vascular dementia without behavioral disturbance: Secondary | ICD-10-CM | POA: Diagnosis not present

## 2018-01-29 DIAGNOSIS — F0391 Unspecified dementia with behavioral disturbance: Secondary | ICD-10-CM | POA: Diagnosis not present

## 2018-01-29 DIAGNOSIS — Z23 Encounter for immunization: Secondary | ICD-10-CM | POA: Diagnosis not present

## 2018-01-29 DIAGNOSIS — R1312 Dysphagia, oropharyngeal phase: Secondary | ICD-10-CM | POA: Diagnosis not present

## 2018-01-30 DIAGNOSIS — F0391 Unspecified dementia with behavioral disturbance: Secondary | ICD-10-CM | POA: Diagnosis not present

## 2018-01-30 DIAGNOSIS — F259 Schizoaffective disorder, unspecified: Secondary | ICD-10-CM | POA: Diagnosis not present

## 2018-01-30 DIAGNOSIS — Z23 Encounter for immunization: Secondary | ICD-10-CM | POA: Diagnosis not present

## 2018-01-30 DIAGNOSIS — R1312 Dysphagia, oropharyngeal phase: Secondary | ICD-10-CM | POA: Diagnosis not present

## 2018-01-30 DIAGNOSIS — Z741 Need for assistance with personal care: Secondary | ICD-10-CM | POA: Diagnosis not present

## 2018-01-30 DIAGNOSIS — F0150 Vascular dementia without behavioral disturbance: Secondary | ICD-10-CM | POA: Diagnosis not present

## 2018-01-30 DIAGNOSIS — R2681 Unsteadiness on feet: Secondary | ICD-10-CM | POA: Diagnosis not present

## 2018-01-31 DIAGNOSIS — Z23 Encounter for immunization: Secondary | ICD-10-CM | POA: Diagnosis not present

## 2018-01-31 DIAGNOSIS — Z741 Need for assistance with personal care: Secondary | ICD-10-CM | POA: Diagnosis not present

## 2018-01-31 DIAGNOSIS — F0150 Vascular dementia without behavioral disturbance: Secondary | ICD-10-CM | POA: Diagnosis not present

## 2018-01-31 DIAGNOSIS — F0391 Unspecified dementia with behavioral disturbance: Secondary | ICD-10-CM | POA: Diagnosis not present

## 2018-01-31 DIAGNOSIS — R1312 Dysphagia, oropharyngeal phase: Secondary | ICD-10-CM | POA: Diagnosis not present

## 2018-01-31 DIAGNOSIS — R2681 Unsteadiness on feet: Secondary | ICD-10-CM | POA: Diagnosis not present

## 2018-01-31 DIAGNOSIS — F259 Schizoaffective disorder, unspecified: Secondary | ICD-10-CM | POA: Diagnosis not present

## 2018-02-01 DIAGNOSIS — F0150 Vascular dementia without behavioral disturbance: Secondary | ICD-10-CM | POA: Diagnosis not present

## 2018-02-01 DIAGNOSIS — Z741 Need for assistance with personal care: Secondary | ICD-10-CM | POA: Diagnosis not present

## 2018-02-01 DIAGNOSIS — R1312 Dysphagia, oropharyngeal phase: Secondary | ICD-10-CM | POA: Diagnosis not present

## 2018-02-01 DIAGNOSIS — R2681 Unsteadiness on feet: Secondary | ICD-10-CM | POA: Diagnosis not present

## 2018-02-01 DIAGNOSIS — F259 Schizoaffective disorder, unspecified: Secondary | ICD-10-CM | POA: Diagnosis not present

## 2018-02-01 DIAGNOSIS — Z23 Encounter for immunization: Secondary | ICD-10-CM | POA: Diagnosis not present

## 2018-02-01 DIAGNOSIS — F0391 Unspecified dementia with behavioral disturbance: Secondary | ICD-10-CM | POA: Diagnosis not present

## 2018-02-03 DIAGNOSIS — F0150 Vascular dementia without behavioral disturbance: Secondary | ICD-10-CM | POA: Diagnosis not present

## 2018-02-03 DIAGNOSIS — F259 Schizoaffective disorder, unspecified: Secondary | ICD-10-CM | POA: Diagnosis not present

## 2018-02-03 DIAGNOSIS — R1312 Dysphagia, oropharyngeal phase: Secondary | ICD-10-CM | POA: Diagnosis not present

## 2018-02-03 DIAGNOSIS — Z741 Need for assistance with personal care: Secondary | ICD-10-CM | POA: Diagnosis not present

## 2018-02-03 DIAGNOSIS — F0391 Unspecified dementia with behavioral disturbance: Secondary | ICD-10-CM | POA: Diagnosis not present

## 2018-02-03 DIAGNOSIS — Z23 Encounter for immunization: Secondary | ICD-10-CM | POA: Diagnosis not present

## 2018-02-03 DIAGNOSIS — R2681 Unsteadiness on feet: Secondary | ICD-10-CM | POA: Diagnosis not present

## 2018-02-04 DIAGNOSIS — F0391 Unspecified dementia with behavioral disturbance: Secondary | ICD-10-CM | POA: Diagnosis not present

## 2018-02-04 DIAGNOSIS — Z741 Need for assistance with personal care: Secondary | ICD-10-CM | POA: Diagnosis not present

## 2018-02-04 DIAGNOSIS — R1312 Dysphagia, oropharyngeal phase: Secondary | ICD-10-CM | POA: Diagnosis not present

## 2018-02-04 DIAGNOSIS — F0150 Vascular dementia without behavioral disturbance: Secondary | ICD-10-CM | POA: Diagnosis not present

## 2018-02-04 DIAGNOSIS — R2681 Unsteadiness on feet: Secondary | ICD-10-CM | POA: Diagnosis not present

## 2018-02-04 DIAGNOSIS — F259 Schizoaffective disorder, unspecified: Secondary | ICD-10-CM | POA: Diagnosis not present

## 2018-02-04 DIAGNOSIS — Z23 Encounter for immunization: Secondary | ICD-10-CM | POA: Diagnosis not present

## 2018-02-05 DIAGNOSIS — Z23 Encounter for immunization: Secondary | ICD-10-CM | POA: Diagnosis not present

## 2018-02-05 DIAGNOSIS — R1312 Dysphagia, oropharyngeal phase: Secondary | ICD-10-CM | POA: Diagnosis not present

## 2018-02-05 DIAGNOSIS — F259 Schizoaffective disorder, unspecified: Secondary | ICD-10-CM | POA: Diagnosis not present

## 2018-02-05 DIAGNOSIS — F0391 Unspecified dementia with behavioral disturbance: Secondary | ICD-10-CM | POA: Diagnosis not present

## 2018-02-05 DIAGNOSIS — F0150 Vascular dementia without behavioral disturbance: Secondary | ICD-10-CM | POA: Diagnosis not present

## 2018-02-05 DIAGNOSIS — R2681 Unsteadiness on feet: Secondary | ICD-10-CM | POA: Diagnosis not present

## 2018-02-05 DIAGNOSIS — Z741 Need for assistance with personal care: Secondary | ICD-10-CM | POA: Diagnosis not present

## 2018-02-06 ENCOUNTER — Encounter: Payer: Self-pay | Admitting: Adult Health

## 2018-02-06 ENCOUNTER — Non-Acute Institutional Stay (SKILLED_NURSING_FACILITY): Payer: Medicare Other | Admitting: Adult Health

## 2018-02-06 DIAGNOSIS — Z23 Encounter for immunization: Secondary | ICD-10-CM | POA: Diagnosis not present

## 2018-02-06 DIAGNOSIS — E1051 Type 1 diabetes mellitus with diabetic peripheral angiopathy without gangrene: Secondary | ICD-10-CM | POA: Diagnosis not present

## 2018-02-06 DIAGNOSIS — E1169 Type 2 diabetes mellitus with other specified complication: Secondary | ICD-10-CM | POA: Diagnosis not present

## 2018-02-06 DIAGNOSIS — F0150 Vascular dementia without behavioral disturbance: Secondary | ICD-10-CM | POA: Diagnosis not present

## 2018-02-06 DIAGNOSIS — E785 Hyperlipidemia, unspecified: Secondary | ICD-10-CM | POA: Diagnosis not present

## 2018-02-06 DIAGNOSIS — G2401 Drug induced subacute dyskinesia: Secondary | ICD-10-CM | POA: Diagnosis not present

## 2018-02-06 DIAGNOSIS — F0151 Vascular dementia with behavioral disturbance: Secondary | ICD-10-CM | POA: Diagnosis not present

## 2018-02-06 DIAGNOSIS — E119 Type 2 diabetes mellitus without complications: Secondary | ICD-10-CM

## 2018-02-06 DIAGNOSIS — F251 Schizoaffective disorder, depressive type: Secondary | ICD-10-CM | POA: Diagnosis not present

## 2018-02-06 DIAGNOSIS — Z794 Long term (current) use of insulin: Secondary | ICD-10-CM

## 2018-02-06 DIAGNOSIS — R2681 Unsteadiness on feet: Secondary | ICD-10-CM | POA: Diagnosis not present

## 2018-02-06 DIAGNOSIS — Z741 Need for assistance with personal care: Secondary | ICD-10-CM | POA: Diagnosis not present

## 2018-02-06 DIAGNOSIS — F259 Schizoaffective disorder, unspecified: Secondary | ICD-10-CM | POA: Diagnosis not present

## 2018-02-06 DIAGNOSIS — F0391 Unspecified dementia with behavioral disturbance: Secondary | ICD-10-CM | POA: Diagnosis not present

## 2018-02-06 DIAGNOSIS — R1312 Dysphagia, oropharyngeal phase: Secondary | ICD-10-CM | POA: Diagnosis not present

## 2018-02-06 DIAGNOSIS — B351 Tinea unguium: Secondary | ICD-10-CM | POA: Diagnosis not present

## 2018-02-06 NOTE — Progress Notes (Signed)
Location:   [facility] Room Number: 225 B Place of Service:  SNF (31)   CODE STATUS: Full Code  Allergies  Allergen Reactions  . Ace Inhibitors     Unknown reaction per MAR   . Ether Nausea And Vomiting    Chief Complaint  Patient presents with  . Medical Management of Chronic Issues    Controlled type 2 diabetes mellitus without complications with long term current use of insulin; hyperlipidemia associated with type 2 diabetes mellitus; vascular dementia without behavioral disturbance tardive dyskinesia     HPI:  She is a 84 year old long resident of this facility being seen for the management of her chronic illnesses; diabetes; dyslipidemia; vascular dementia; tardive dyskinesia. She is having asymptomatic bradycardia. She denies any chest pain; no palpitations. She denies any uncontrolled pain; no changes in appetite. She is on aricpet long term; this medication can affect heart rate.   Past Medical History:  Diagnosis Date  . Allergic rhinitis   . CATARACT 06/06/2006   Qualifier: Diagnosis of  By: Genene Churn MD, Janett Billow    . Controlled diabetes mellitus type II without complication ([place]) 6/60/6301   Qualifier: Diagnosis of  By: Genene Churn MD, Janett Billow    . Depressive disorder   . Diabetes mellitus type 2 in obese ([place])   . Essential hypertension, benign 02/24/2015  . Fungal dermatitis   . Hyperlipidemia   . Macular degeneration   . Mild cognitive impairment   . Narcissism ([place])   . Normocytic anemia   . Phobia   . Schizophrenia, paranoid ([place])   . Stress incontinence, female     Past Surgical History:  Procedure Laterality Date  . ABDOMINAL HYSTERECTOMY     due to fibroids  . Avastin Ingection Right 03/26/2017   In Right eye  . CATARACT EXTRACTION  6 16 2006  . CRYOTHERAPY  2 15 2006   facial AK  . hyperplastic   4 20 2006   AK shave biopsy  . intraocular injection  [id number]  . TOTAL ABDOMINAL HYSTERECTOMY W/ BILATERAL SALPINGOOPHORECTOMY     due  to endometriosis  non malignant    Social History   Socioeconomic History  . Marital status: Divorced    Spouse name: Not on file  . Number of children: Not on file  . Years of education: Not on file  . Highest education level: Not on file  Occupational History  . Not on file  Social Needs  . Financial resource strain: Not on file  . Food insecurity:    Worry: Not on file    Inability: Not on file  . Transportation needs:    Medical: Not on file    Non-medical: Not on file  Tobacco Use  . Smoking status: Never Smoker  . Smokeless tobacco: Never Used  Substance and Sexual Activity  . Alcohol use: No  . Drug use: No  . Sexual activity: Not on file  Lifestyle  . Physical activity:    Days per week: Not on file    Minutes per session: Not on file  . Stress: Not on file  Relationships  . Social connections:    Talks on phone: Not on file    Gets together: Not on file    Attends religious service: Not on file    Active member of club or organization: Not on file    Attends meetings of clubs or organizations: Not on file    Relationship status: Not on file  .  Intimate partner violence:    Fear of current or ex partner: Not on file    Emotionally abused: Not on file    Physically abused: Not on file    Forced sexual activity: Not on file  Other Topics Concern  . Not on file  Social History Narrative  . Not on file   Family History  Problem Relation Age of Onset  . Heart disease Mother   . Heart disease Father   . Stroke Father   . Cancer Cousin        liver and colon  . Stroke Sister       VITAL SIGNS BP 127/82   Pulse 81   Temp (!) 97 F (36.1 C)   Resp 18   Ht 4\' 9"  (1.448 m)   Wt 157 lb 6.4 oz (71.4 kg)   SpO2 99%   BMI 34.06 kg/m   Outpatient Encounter Medications as of 02/06/2018  Medication Sig  . aspirin EC 325 MG tablet Take 1 tablet (325 mg total) by mouth daily.  Marland Kitchen atorvastatin (LIPITOR) 20 MG tablet Take 1 tablet (20 mg total) by mouth  at bedtime.  . bacitracin-polymyxin b, ophth, (POLYSPORIN) OINT Place 1 application into both eyes at bedtime.  . benztropine (COGENTIN) 1 MG tablet Take 1 mg by mouth at bedtime.  . Calcium Carbonate-Vitamin D (CALCIUM 600-D) 600-400 MG-UNIT per tablet Take 1 tablet by mouth at bedtime.   . cholecalciferol (VITAMIN D) 1000 UNITS tablet Take 1,000 Units by mouth at bedtime.   . cyanocobalamin 1000 MCG tablet Take 1,000 mcg by mouth daily with breakfast.   . docusate sodium (COLACE) 100 MG capsule Take 100 mg by mouth daily.  Marland Kitchen donepezil (ARICEPT) 5 MG tablet Take 5 mg by mouth at bedtime.   Marland Kitchen escitalopram (LEXAPRO) 20 MG tablet Take 20 mg by mouth daily with breakfast.   . Hypromellose 0.4 % SOLN Place 1 drop into both eyes 3 (three) times daily.  . insulin aspart (NOVOLOG) 100 UNIT/ML injection 0 - 70 = 0 units 71 - 400 = 8 units Notify MD if BS is less than 70 or greater than 400. Hold Insulin if BS is less than 70  . Insulin Glargine (LANTUS SOLOSTAR) 100 UNIT/ML Solostar Pen Inject 20 Units into the skin daily.  Marland Kitchen lip balm (CARMEX) ointment 1 application. Apply to lips 4 times daily for cold sore  . loratadine (CLARITIN) 10 MG tablet Take 10 mg by mouth daily with breakfast.   . losartan (COZAAR) 50 MG tablet Take 50 mg by mouth daily with breakfast.   . Lotion Base LOTN Apply 1 application topically 2 (two) times daily.  . Memantine HCl ER (NAMENDA XR) 28 MG CP24 Take 28 mg by mouth daily with breakfast. For dementia  . Multiple Vitamin (MULTIVITAMIN) tablet Take 1 tablet by mouth daily.  . Nutritional Supplements (NUTRITIONAL SUPPLEMENT PO) Regular Diet - Pureed texture, Nectar Thickened fluids consistency.  Large portions  . OLANZapine (ZYPREXA) 5 MG tablet Take 5 mg by mouth at bedtime.  Marland Kitchen omeprazole (PRILOSEC) 20 MG capsule Take 20 mg by mouth daily at 6 (six) AM.  . white petrolatum (VASELINE) GEL Apply to left forehead every shift   No facility-administered encounter medications  on file as of 02/06/2018.      SIGNIFICANT DIAGNOSTIC EXAMS  PREVIOUS  12-30-14: right eye intraocular injection     03-12-16: EEG: This is a mildly abnormal EEG due to the presence of  moderate generalized  slowing which is indicative of bihemispheric dysfunction which is a nonspecific finding seen in a variety of degenerative, hypoxic, ischemic, toxic, metabolic etiologies. No definite epileptiform activity is noted  12-03-16: chest x-ray: no acute cardiopulmonary disease  01-10-17: chest x-ray: no acute cardiopulmonary disease  04-02-17: ct of head:  1. No acute intracranial pathology seen on CT. 2. Mild to moderate cortical volume loss and scattered small vessel ischemic microangiopathy. 3. Partial opacification of the mastoid air cells bilaterally.  04-02-17: ct angio of head and neck: Minor extracranial and intracranial atheromatous change, not unexpected for age, and stable from 2017. No large vessel occlusion, dissection, or other significant vascular finding. Atrophy and small vessel disease without acute intracranial process.  04-02-17: MRI of brain: Atrophy and small vessel disease. No acute intracranial findings.  NO NEW EXAMS      LABS REVIEWED: PREVIOUS    03-29-17: chol 162; ldl 75 trig 214; hdl 44; hgb a1c 9.4 04-02-17: wbc 8.5; hgb 14.2; hct 44.1; mcv 86.0; plt 213; glucose 175; bun 20; creat 1.10; k+ 4.2; na++ 138; ca 9.0; liver normal albumin 3.1; ammonia 14 04-04-17: wbc 8.7; hgb 11.1; hct 34.0; mcv 85.4; plt 188; glucose 97; bun 16; creat 0.87; k+ 3.7; na++137; ca 8.1 05-10-17: wbc 7.3; hgb 12.7; hct 39.7; mcv 85.3; plt 189; glucose 210; bun 24.2; creat 0.92; k+ 4.4; na++ 140; ca 9.2  09-13-17: chol 183; ldl 100; trig 194; hdl 44; hgb a1c 7.6   TODAY:   12-10-17: wbc 9.1; hgb 13.0; hct 40; plt 237; glucose 173; bun 14; creat 0.9; k+ 4.1; na++ 141; liver normal  12-16-17: wbc 10.7; hgb 12.4; hct 37; plt 220; glucose 253; bun 23; creat 1.1; k+ 4.3; na++ 136; liver  normal  01-04-18: hgb a1c 7.7     Review of Systems  Constitutional: Negative for malaise/fatigue.  Respiratory: Negative for cough and shortness of breath.   Cardiovascular: Negative for chest pain, palpitations and leg swelling.  Gastrointestinal: Negative for abdominal pain, constipation and heartburn.  Musculoskeletal: Negative for back pain, joint pain and myalgias.  Skin: Negative.   Neurological: Negative for dizziness.  Psychiatric/Behavioral: The patient is not nervous/anxious.       Physical Exam  Constitutional: She appears well-developed and well-nourished. No distress.  Neck: No thyromegaly present.  Cardiovascular: Normal rate, regular rhythm and intact distal pulses.  Murmur heard. 1/6  Pulmonary/Chest: Effort normal and breath sounds normal. No respiratory distress.  Abdominal: Soft. Bowel sounds are normal. She exhibits no distension. There is no tenderness.  Musculoskeletal: She exhibits no edema.  Is able to move all extremities  Uses wheelchair   Lymphadenopathy:    She has no cervical adenopathy.  Neurological: She is alert.  Skin: Skin is warm and dry. She is not diaphoretic.  Psychiatric: She has a normal mood and affect.     ASSESSMENT/ PLAN:  TODAY:   1. Dementia without behavior disturbance; is without change will continue namenda xr 28 mg daily will  monitor   Her current weight is 157 pounds and is currently stable.  Will stop aricept due to bradycardia   2. Tardive dyskinesia: stable  she does not have a resting tremor or lip tremor . Will continue cogentin 1 mg nightly   3. Hyperlipidemia associated with type 2 diabetes mellitus: stable  ldl 75; trig 214  will continue lipitor 20 mg daily   4. Controlled diabetes type II without complication with long term insulin use:  cbgs are slightly elevated  hgb a1c 7.7 (previous 6.9)  Will continue  novolog  8 units with meals lantus 20 units nightly    Is on  statin and arb, asa   5. Bradycardia:  pulse rates are in the 40-50's; will get an EKG and will stop aricept as this can cause bradycardia.   PREVIOUS    6. Dysphagia, oropharyngeal phase:stable she is on nectar thick liquids; no signs of aspiration present   7. Gerd without esophagitis: stable will continue prilosec 20  mg daily   8. Chronic constipation stable : will continue colace daily    9. Depressive dissorder: stable  will continue lexapro 20 mg daily will monitor   10. Schizophrenia: is stable continue  zyprexa 5  mg nightly and will monitor   Is followed by psych services.  Will continue cogentin 1 mg nightly for tardive dyskinesia and will monitor   11. Chronic allergic bronchitis: will continue claritin 10 mg daily  12. TIA: is presently stable; will continue asa 325 mg daily   13. Essential hypertension benign: b/p 127/82  is stable : will continue cozaar 50 mg daily       MD is aware of resident's narcotic use and is in agreement with current plan of care. We will attempt to wean resident as apropriate   Ok Edwards NP [facility]  Contact [phone] Monday through Friday 8am- 5pm  After hours call [phone]

## 2018-02-07 DIAGNOSIS — I4430 Unspecified atrioventricular block: Secondary | ICD-10-CM | POA: Diagnosis not present

## 2018-02-07 DIAGNOSIS — Z741 Need for assistance with personal care: Secondary | ICD-10-CM | POA: Diagnosis not present

## 2018-02-07 DIAGNOSIS — I4510 Unspecified right bundle-branch block: Secondary | ICD-10-CM | POA: Diagnosis not present

## 2018-02-07 DIAGNOSIS — R001 Bradycardia, unspecified: Secondary | ICD-10-CM | POA: Diagnosis not present

## 2018-02-07 DIAGNOSIS — R489 Unspecified symbolic dysfunctions: Secondary | ICD-10-CM | POA: Diagnosis not present

## 2018-02-07 DIAGNOSIS — F0391 Unspecified dementia with behavioral disturbance: Secondary | ICD-10-CM | POA: Diagnosis not present

## 2018-02-07 DIAGNOSIS — R278 Other lack of coordination: Secondary | ICD-10-CM | POA: Diagnosis not present

## 2018-02-07 DIAGNOSIS — F0150 Vascular dementia without behavioral disturbance: Secondary | ICD-10-CM | POA: Diagnosis not present

## 2018-02-10 DIAGNOSIS — R489 Unspecified symbolic dysfunctions: Secondary | ICD-10-CM | POA: Diagnosis not present

## 2018-02-10 DIAGNOSIS — Z741 Need for assistance with personal care: Secondary | ICD-10-CM | POA: Diagnosis not present

## 2018-02-10 DIAGNOSIS — R278 Other lack of coordination: Secondary | ICD-10-CM | POA: Diagnosis not present

## 2018-02-10 DIAGNOSIS — F0391 Unspecified dementia with behavioral disturbance: Secondary | ICD-10-CM | POA: Diagnosis not present

## 2018-02-10 DIAGNOSIS — F0150 Vascular dementia without behavioral disturbance: Secondary | ICD-10-CM | POA: Diagnosis not present

## 2018-02-11 DIAGNOSIS — Z741 Need for assistance with personal care: Secondary | ICD-10-CM | POA: Diagnosis not present

## 2018-02-11 DIAGNOSIS — F0150 Vascular dementia without behavioral disturbance: Secondary | ICD-10-CM | POA: Diagnosis not present

## 2018-02-11 DIAGNOSIS — R489 Unspecified symbolic dysfunctions: Secondary | ICD-10-CM | POA: Diagnosis not present

## 2018-02-11 DIAGNOSIS — R278 Other lack of coordination: Secondary | ICD-10-CM | POA: Diagnosis not present

## 2018-02-11 DIAGNOSIS — F0391 Unspecified dementia with behavioral disturbance: Secondary | ICD-10-CM | POA: Diagnosis not present

## 2018-02-12 DIAGNOSIS — R278 Other lack of coordination: Secondary | ICD-10-CM | POA: Diagnosis not present

## 2018-02-12 DIAGNOSIS — F0391 Unspecified dementia with behavioral disturbance: Secondary | ICD-10-CM | POA: Diagnosis not present

## 2018-02-12 DIAGNOSIS — F0150 Vascular dementia without behavioral disturbance: Secondary | ICD-10-CM | POA: Diagnosis not present

## 2018-02-12 DIAGNOSIS — Z741 Need for assistance with personal care: Secondary | ICD-10-CM | POA: Diagnosis not present

## 2018-02-12 DIAGNOSIS — R489 Unspecified symbolic dysfunctions: Secondary | ICD-10-CM | POA: Diagnosis not present

## 2018-02-13 DIAGNOSIS — F0391 Unspecified dementia with behavioral disturbance: Secondary | ICD-10-CM | POA: Diagnosis not present

## 2018-02-13 DIAGNOSIS — R489 Unspecified symbolic dysfunctions: Secondary | ICD-10-CM | POA: Diagnosis not present

## 2018-02-13 DIAGNOSIS — F0150 Vascular dementia without behavioral disturbance: Secondary | ICD-10-CM | POA: Diagnosis not present

## 2018-02-13 DIAGNOSIS — Z741 Need for assistance with personal care: Secondary | ICD-10-CM | POA: Diagnosis not present

## 2018-02-13 DIAGNOSIS — R278 Other lack of coordination: Secondary | ICD-10-CM | POA: Diagnosis not present

## 2018-02-14 DIAGNOSIS — F0150 Vascular dementia without behavioral disturbance: Secondary | ICD-10-CM | POA: Diagnosis not present

## 2018-02-14 DIAGNOSIS — R278 Other lack of coordination: Secondary | ICD-10-CM | POA: Diagnosis not present

## 2018-02-14 DIAGNOSIS — Z741 Need for assistance with personal care: Secondary | ICD-10-CM | POA: Diagnosis not present

## 2018-02-14 DIAGNOSIS — R489 Unspecified symbolic dysfunctions: Secondary | ICD-10-CM | POA: Diagnosis not present

## 2018-02-14 DIAGNOSIS — F0391 Unspecified dementia with behavioral disturbance: Secondary | ICD-10-CM | POA: Diagnosis not present

## 2018-02-18 DIAGNOSIS — F0150 Vascular dementia without behavioral disturbance: Secondary | ICD-10-CM | POA: Diagnosis not present

## 2018-02-18 DIAGNOSIS — E119 Type 2 diabetes mellitus without complications: Secondary | ICD-10-CM | POA: Diagnosis not present

## 2018-02-18 DIAGNOSIS — Z741 Need for assistance with personal care: Secondary | ICD-10-CM | POA: Diagnosis not present

## 2018-02-18 DIAGNOSIS — R489 Unspecified symbolic dysfunctions: Secondary | ICD-10-CM | POA: Diagnosis not present

## 2018-02-18 DIAGNOSIS — R278 Other lack of coordination: Secondary | ICD-10-CM | POA: Diagnosis not present

## 2018-02-18 DIAGNOSIS — F0391 Unspecified dementia with behavioral disturbance: Secondary | ICD-10-CM | POA: Diagnosis not present

## 2018-02-19 DIAGNOSIS — F0391 Unspecified dementia with behavioral disturbance: Secondary | ICD-10-CM | POA: Diagnosis not present

## 2018-02-19 DIAGNOSIS — Z741 Need for assistance with personal care: Secondary | ICD-10-CM | POA: Diagnosis not present

## 2018-02-19 DIAGNOSIS — R278 Other lack of coordination: Secondary | ICD-10-CM | POA: Diagnosis not present

## 2018-02-19 DIAGNOSIS — F0150 Vascular dementia without behavioral disturbance: Secondary | ICD-10-CM | POA: Diagnosis not present

## 2018-02-19 DIAGNOSIS — R489 Unspecified symbolic dysfunctions: Secondary | ICD-10-CM | POA: Diagnosis not present

## 2018-02-20 DIAGNOSIS — R278 Other lack of coordination: Secondary | ICD-10-CM | POA: Diagnosis not present

## 2018-02-20 DIAGNOSIS — Z741 Need for assistance with personal care: Secondary | ICD-10-CM | POA: Diagnosis not present

## 2018-02-20 DIAGNOSIS — F0391 Unspecified dementia with behavioral disturbance: Secondary | ICD-10-CM | POA: Diagnosis not present

## 2018-02-20 DIAGNOSIS — F0150 Vascular dementia without behavioral disturbance: Secondary | ICD-10-CM | POA: Diagnosis not present

## 2018-02-20 DIAGNOSIS — R489 Unspecified symbolic dysfunctions: Secondary | ICD-10-CM | POA: Diagnosis not present

## 2018-02-21 ENCOUNTER — Encounter (INDEPENDENT_AMBULATORY_CARE_PROVIDER_SITE_OTHER): Payer: Medicare Other | Admitting: Ophthalmology

## 2018-02-21 ENCOUNTER — Ambulatory Visit: Payer: Medicare Other | Admitting: Podiatry

## 2018-02-21 DIAGNOSIS — I10 Essential (primary) hypertension: Secondary | ICD-10-CM | POA: Diagnosis not present

## 2018-02-21 DIAGNOSIS — H43813 Vitreous degeneration, bilateral: Secondary | ICD-10-CM

## 2018-02-21 DIAGNOSIS — H353231 Exudative age-related macular degeneration, bilateral, with active choroidal neovascularization: Secondary | ICD-10-CM

## 2018-02-21 DIAGNOSIS — H35033 Hypertensive retinopathy, bilateral: Secondary | ICD-10-CM

## 2018-02-24 ENCOUNTER — Encounter: Payer: Self-pay | Admitting: Podiatry

## 2018-02-24 ENCOUNTER — Ambulatory Visit (INDEPENDENT_AMBULATORY_CARE_PROVIDER_SITE_OTHER): Payer: Medicare Other | Admitting: Podiatry

## 2018-02-24 DIAGNOSIS — M79674 Pain in right toe(s): Secondary | ICD-10-CM

## 2018-02-24 DIAGNOSIS — E1140 Type 2 diabetes mellitus with diabetic neuropathy, unspecified: Secondary | ICD-10-CM

## 2018-02-24 DIAGNOSIS — M79675 Pain in left toe(s): Secondary | ICD-10-CM

## 2018-02-24 DIAGNOSIS — B351 Tinea unguium: Secondary | ICD-10-CM

## 2018-02-24 DIAGNOSIS — E1151 Type 2 diabetes mellitus with diabetic peripheral angiopathy without gangrene: Secondary | ICD-10-CM

## 2018-02-25 NOTE — Progress Notes (Signed)
Subjective:   Patient ID: Debra Tran, female   DOB: 84 y.o.   MRN: [id number]   HPI Patient presents with elongated nailbeds 1-5 both feet that are thick yellow brittle and patient cannot cut   ROS      Objective:  Physical Exam  No change neurovascular status with thick yellow brittle nailbeds 1-5 both feet that are painful     Assessment:  Mycotic nail infection with Tran 1-5 both feet     Plan:  Debride painful nailbeds 1-5 both feet with no iatrogenic bleeding noted

## 2018-02-26 DIAGNOSIS — Z79899 Other long term (current) drug therapy: Secondary | ICD-10-CM | POA: Diagnosis not present

## 2018-02-26 DIAGNOSIS — N390 Urinary tract infection, site not specified: Secondary | ICD-10-CM | POA: Diagnosis not present

## 2018-03-03 DIAGNOSIS — R278 Other lack of coordination: Secondary | ICD-10-CM | POA: Diagnosis not present

## 2018-03-03 DIAGNOSIS — F0391 Unspecified dementia with behavioral disturbance: Secondary | ICD-10-CM | POA: Diagnosis not present

## 2018-03-03 DIAGNOSIS — R489 Unspecified symbolic dysfunctions: Secondary | ICD-10-CM | POA: Diagnosis not present

## 2018-03-03 DIAGNOSIS — F0150 Vascular dementia without behavioral disturbance: Secondary | ICD-10-CM | POA: Diagnosis not present

## 2018-03-03 DIAGNOSIS — Z741 Need for assistance with personal care: Secondary | ICD-10-CM | POA: Diagnosis not present

## 2018-03-04 DIAGNOSIS — Z741 Need for assistance with personal care: Secondary | ICD-10-CM | POA: Diagnosis not present

## 2018-03-04 DIAGNOSIS — F0391 Unspecified dementia with behavioral disturbance: Secondary | ICD-10-CM | POA: Diagnosis not present

## 2018-03-04 DIAGNOSIS — F0150 Vascular dementia without behavioral disturbance: Secondary | ICD-10-CM | POA: Diagnosis not present

## 2018-03-04 DIAGNOSIS — R489 Unspecified symbolic dysfunctions: Secondary | ICD-10-CM | POA: Diagnosis not present

## 2018-03-04 DIAGNOSIS — R278 Other lack of coordination: Secondary | ICD-10-CM | POA: Diagnosis not present

## 2018-03-05 DIAGNOSIS — R489 Unspecified symbolic dysfunctions: Secondary | ICD-10-CM | POA: Diagnosis not present

## 2018-03-05 DIAGNOSIS — F0150 Vascular dementia without behavioral disturbance: Secondary | ICD-10-CM | POA: Diagnosis not present

## 2018-03-05 DIAGNOSIS — F0391 Unspecified dementia with behavioral disturbance: Secondary | ICD-10-CM | POA: Diagnosis not present

## 2018-03-05 DIAGNOSIS — R278 Other lack of coordination: Secondary | ICD-10-CM | POA: Diagnosis not present

## 2018-03-05 DIAGNOSIS — Z741 Need for assistance with personal care: Secondary | ICD-10-CM | POA: Diagnosis not present

## 2018-03-06 DIAGNOSIS — F0150 Vascular dementia without behavioral disturbance: Secondary | ICD-10-CM | POA: Diagnosis not present

## 2018-03-06 DIAGNOSIS — Z741 Need for assistance with personal care: Secondary | ICD-10-CM | POA: Diagnosis not present

## 2018-03-06 DIAGNOSIS — F0391 Unspecified dementia with behavioral disturbance: Secondary | ICD-10-CM | POA: Diagnosis not present

## 2018-03-06 DIAGNOSIS — R278 Other lack of coordination: Secondary | ICD-10-CM | POA: Diagnosis not present

## 2018-03-06 DIAGNOSIS — R489 Unspecified symbolic dysfunctions: Secondary | ICD-10-CM | POA: Diagnosis not present

## 2018-03-07 DIAGNOSIS — R489 Unspecified symbolic dysfunctions: Secondary | ICD-10-CM | POA: Diagnosis not present

## 2018-03-07 DIAGNOSIS — Z741 Need for assistance with personal care: Secondary | ICD-10-CM | POA: Diagnosis not present

## 2018-03-07 DIAGNOSIS — F0391 Unspecified dementia with behavioral disturbance: Secondary | ICD-10-CM | POA: Diagnosis not present

## 2018-03-07 DIAGNOSIS — R278 Other lack of coordination: Secondary | ICD-10-CM | POA: Diagnosis not present

## 2018-03-07 DIAGNOSIS — F0150 Vascular dementia without behavioral disturbance: Secondary | ICD-10-CM | POA: Diagnosis not present

## 2018-03-10 DIAGNOSIS — R489 Unspecified symbolic dysfunctions: Secondary | ICD-10-CM | POA: Diagnosis not present

## 2018-03-10 DIAGNOSIS — F0391 Unspecified dementia with behavioral disturbance: Secondary | ICD-10-CM | POA: Diagnosis not present

## 2018-03-10 DIAGNOSIS — F0150 Vascular dementia without behavioral disturbance: Secondary | ICD-10-CM | POA: Diagnosis not present

## 2018-03-11 DIAGNOSIS — R489 Unspecified symbolic dysfunctions: Secondary | ICD-10-CM | POA: Diagnosis not present

## 2018-03-11 DIAGNOSIS — F0150 Vascular dementia without behavioral disturbance: Secondary | ICD-10-CM | POA: Diagnosis not present

## 2018-03-11 DIAGNOSIS — F0391 Unspecified dementia with behavioral disturbance: Secondary | ICD-10-CM | POA: Diagnosis not present

## 2018-03-11 DIAGNOSIS — F0151 Vascular dementia with behavioral disturbance: Secondary | ICD-10-CM | POA: Diagnosis not present

## 2018-03-11 DIAGNOSIS — F251 Schizoaffective disorder, depressive type: Secondary | ICD-10-CM | POA: Diagnosis not present

## 2018-03-12 DIAGNOSIS — F0150 Vascular dementia without behavioral disturbance: Secondary | ICD-10-CM | POA: Diagnosis not present

## 2018-03-12 DIAGNOSIS — F0391 Unspecified dementia with behavioral disturbance: Secondary | ICD-10-CM | POA: Diagnosis not present

## 2018-03-12 DIAGNOSIS — R489 Unspecified symbolic dysfunctions: Secondary | ICD-10-CM | POA: Diagnosis not present

## 2018-03-13 DIAGNOSIS — F0391 Unspecified dementia with behavioral disturbance: Secondary | ICD-10-CM | POA: Diagnosis not present

## 2018-03-13 DIAGNOSIS — F0150 Vascular dementia without behavioral disturbance: Secondary | ICD-10-CM | POA: Diagnosis not present

## 2018-03-13 DIAGNOSIS — R489 Unspecified symbolic dysfunctions: Secondary | ICD-10-CM | POA: Diagnosis not present

## 2018-03-14 DIAGNOSIS — F0150 Vascular dementia without behavioral disturbance: Secondary | ICD-10-CM | POA: Diagnosis not present

## 2018-03-14 DIAGNOSIS — R489 Unspecified symbolic dysfunctions: Secondary | ICD-10-CM | POA: Diagnosis not present

## 2018-03-14 DIAGNOSIS — F0391 Unspecified dementia with behavioral disturbance: Secondary | ICD-10-CM | POA: Diagnosis not present

## 2018-03-25 DIAGNOSIS — F0151 Vascular dementia with behavioral disturbance: Secondary | ICD-10-CM | POA: Diagnosis not present

## 2018-03-25 DIAGNOSIS — F251 Schizoaffective disorder, depressive type: Secondary | ICD-10-CM | POA: Diagnosis not present

## 2018-03-26 DIAGNOSIS — Z79899 Other long term (current) drug therapy: Secondary | ICD-10-CM | POA: Diagnosis not present

## 2018-03-26 DIAGNOSIS — R319 Hematuria, unspecified: Secondary | ICD-10-CM | POA: Diagnosis not present

## 2018-03-26 DIAGNOSIS — N390 Urinary tract infection, site not specified: Secondary | ICD-10-CM | POA: Diagnosis not present

## 2018-03-28 ENCOUNTER — Encounter (INDEPENDENT_AMBULATORY_CARE_PROVIDER_SITE_OTHER): Payer: Medicare Other | Admitting: Ophthalmology

## 2018-03-28 DIAGNOSIS — H353231 Exudative age-related macular degeneration, bilateral, with active choroidal neovascularization: Secondary | ICD-10-CM | POA: Diagnosis not present

## 2018-03-28 DIAGNOSIS — I10 Essential (primary) hypertension: Secondary | ICD-10-CM

## 2018-03-28 DIAGNOSIS — H43813 Vitreous degeneration, bilateral: Secondary | ICD-10-CM | POA: Diagnosis not present

## 2018-03-28 DIAGNOSIS — H35033 Hypertensive retinopathy, bilateral: Secondary | ICD-10-CM

## 2018-03-31 DIAGNOSIS — N3000 Acute cystitis without hematuria: Secondary | ICD-10-CM | POA: Diagnosis not present

## 2018-03-31 DIAGNOSIS — A499 Bacterial infection, unspecified: Secondary | ICD-10-CM | POA: Diagnosis not present

## 2018-03-31 DIAGNOSIS — N390 Urinary tract infection, site not specified: Secondary | ICD-10-CM | POA: Diagnosis not present

## 2018-03-31 DIAGNOSIS — I10 Essential (primary) hypertension: Secondary | ICD-10-CM | POA: Diagnosis not present

## 2018-04-01 DIAGNOSIS — D649 Anemia, unspecified: Secondary | ICD-10-CM | POA: Diagnosis not present

## 2018-04-01 DIAGNOSIS — E039 Hypothyroidism, unspecified: Secondary | ICD-10-CM | POA: Diagnosis not present

## 2018-04-01 DIAGNOSIS — I10 Essential (primary) hypertension: Secondary | ICD-10-CM | POA: Diagnosis not present

## 2018-04-01 DIAGNOSIS — E785 Hyperlipidemia, unspecified: Secondary | ICD-10-CM | POA: Diagnosis not present

## 2018-04-03 DIAGNOSIS — I10 Essential (primary) hypertension: Secondary | ICD-10-CM | POA: Diagnosis not present

## 2018-04-03 DIAGNOSIS — E782 Mixed hyperlipidemia: Secondary | ICD-10-CM | POA: Diagnosis not present

## 2018-04-03 DIAGNOSIS — E11311 Type 2 diabetes mellitus with unspecified diabetic retinopathy with macular edema: Secondary | ICD-10-CM | POA: Diagnosis not present

## 2018-04-03 DIAGNOSIS — N390 Urinary tract infection, site not specified: Secondary | ICD-10-CM | POA: Diagnosis not present

## 2018-04-12 DIAGNOSIS — E119 Type 2 diabetes mellitus without complications: Secondary | ICD-10-CM | POA: Diagnosis not present

## 2018-04-12 DIAGNOSIS — R319 Hematuria, unspecified: Secondary | ICD-10-CM | POA: Diagnosis not present

## 2018-04-12 DIAGNOSIS — I10 Essential (primary) hypertension: Secondary | ICD-10-CM | POA: Diagnosis not present

## 2018-04-13 DIAGNOSIS — E118 Type 2 diabetes mellitus with unspecified complications: Secondary | ICD-10-CM | POA: Diagnosis not present

## 2018-04-13 DIAGNOSIS — D649 Anemia, unspecified: Secondary | ICD-10-CM | POA: Diagnosis not present

## 2018-04-17 DIAGNOSIS — F251 Schizoaffective disorder, depressive type: Secondary | ICD-10-CM | POA: Diagnosis not present

## 2018-04-17 DIAGNOSIS — F0151 Vascular dementia with behavioral disturbance: Secondary | ICD-10-CM | POA: Diagnosis not present

## 2018-04-30 DIAGNOSIS — K219 Gastro-esophageal reflux disease without esophagitis: Secondary | ICD-10-CM | POA: Diagnosis not present

## 2018-04-30 DIAGNOSIS — I10 Essential (primary) hypertension: Secondary | ICD-10-CM | POA: Diagnosis not present

## 2018-04-30 DIAGNOSIS — E782 Mixed hyperlipidemia: Secondary | ICD-10-CM | POA: Diagnosis not present

## 2018-04-30 DIAGNOSIS — E11311 Type 2 diabetes mellitus with unspecified diabetic retinopathy with macular edema: Secondary | ICD-10-CM | POA: Diagnosis not present

## 2018-05-01 DIAGNOSIS — F251 Schizoaffective disorder, depressive type: Secondary | ICD-10-CM | POA: Diagnosis not present

## 2018-05-01 DIAGNOSIS — F0151 Vascular dementia with behavioral disturbance: Secondary | ICD-10-CM | POA: Diagnosis not present

## 2018-05-01 DIAGNOSIS — E11311 Type 2 diabetes mellitus with unspecified diabetic retinopathy with macular edema: Secondary | ICD-10-CM | POA: Diagnosis not present

## 2018-05-01 DIAGNOSIS — K219 Gastro-esophageal reflux disease without esophagitis: Secondary | ICD-10-CM | POA: Diagnosis not present

## 2018-05-01 DIAGNOSIS — I10 Essential (primary) hypertension: Secondary | ICD-10-CM | POA: Diagnosis not present

## 2018-05-01 DIAGNOSIS — E782 Mixed hyperlipidemia: Secondary | ICD-10-CM | POA: Diagnosis not present

## 2018-05-09 ENCOUNTER — Encounter (INDEPENDENT_AMBULATORY_CARE_PROVIDER_SITE_OTHER): Payer: Medicare Other | Admitting: Ophthalmology

## 2018-05-09 DIAGNOSIS — H35033 Hypertensive retinopathy, bilateral: Secondary | ICD-10-CM

## 2018-05-09 DIAGNOSIS — H353231 Exudative age-related macular degeneration, bilateral, with active choroidal neovascularization: Secondary | ICD-10-CM

## 2018-05-09 DIAGNOSIS — I10 Essential (primary) hypertension: Secondary | ICD-10-CM | POA: Diagnosis not present

## 2018-05-09 DIAGNOSIS — H43813 Vitreous degeneration, bilateral: Secondary | ICD-10-CM

## 2018-05-26 ENCOUNTER — Ambulatory Visit: Payer: Medicare Other | Admitting: Podiatry

## 2018-05-28 ENCOUNTER — Ambulatory Visit: Payer: Medicare Other | Admitting: Podiatry

## 2018-05-28 DIAGNOSIS — E11311 Type 2 diabetes mellitus with unspecified diabetic retinopathy with macular edema: Secondary | ICD-10-CM | POA: Diagnosis not present

## 2018-05-28 DIAGNOSIS — K219 Gastro-esophageal reflux disease without esophagitis: Secondary | ICD-10-CM | POA: Diagnosis not present

## 2018-05-28 DIAGNOSIS — I10 Essential (primary) hypertension: Secondary | ICD-10-CM | POA: Diagnosis not present

## 2018-05-28 DIAGNOSIS — E782 Mixed hyperlipidemia: Secondary | ICD-10-CM | POA: Diagnosis not present

## 2018-05-29 DIAGNOSIS — D649 Anemia, unspecified: Secondary | ICD-10-CM | POA: Diagnosis not present

## 2018-05-29 DIAGNOSIS — E785 Hyperlipidemia, unspecified: Secondary | ICD-10-CM | POA: Diagnosis not present

## 2018-05-29 DIAGNOSIS — E559 Vitamin D deficiency, unspecified: Secondary | ICD-10-CM | POA: Diagnosis not present

## 2018-05-29 DIAGNOSIS — K219 Gastro-esophageal reflux disease without esophagitis: Secondary | ICD-10-CM | POA: Diagnosis not present

## 2018-05-29 DIAGNOSIS — E039 Hypothyroidism, unspecified: Secondary | ICD-10-CM | POA: Diagnosis not present

## 2018-05-29 DIAGNOSIS — E782 Mixed hyperlipidemia: Secondary | ICD-10-CM | POA: Diagnosis not present

## 2018-05-29 DIAGNOSIS — I10 Essential (primary) hypertension: Secondary | ICD-10-CM | POA: Diagnosis not present

## 2018-05-29 DIAGNOSIS — E11311 Type 2 diabetes mellitus with unspecified diabetic retinopathy with macular edema: Secondary | ICD-10-CM | POA: Diagnosis not present

## 2018-05-29 DIAGNOSIS — D508 Other iron deficiency anemias: Secondary | ICD-10-CM | POA: Diagnosis not present

## 2018-05-30 ENCOUNTER — Ambulatory Visit: Payer: Medicare Other | Admitting: Podiatry

## 2018-06-05 DIAGNOSIS — F251 Schizoaffective disorder, depressive type: Secondary | ICD-10-CM | POA: Diagnosis not present

## 2018-06-05 DIAGNOSIS — F0151 Vascular dementia with behavioral disturbance: Secondary | ICD-10-CM | POA: Diagnosis not present

## 2018-06-16 DIAGNOSIS — E11311 Type 2 diabetes mellitus with unspecified diabetic retinopathy with macular edema: Secondary | ICD-10-CM | POA: Diagnosis not present

## 2018-06-16 DIAGNOSIS — R1311 Dysphagia, oral phase: Secondary | ICD-10-CM | POA: Diagnosis not present

## 2018-06-16 DIAGNOSIS — R278 Other lack of coordination: Secondary | ICD-10-CM | POA: Diagnosis not present

## 2018-06-16 DIAGNOSIS — F0150 Vascular dementia without behavioral disturbance: Secondary | ICD-10-CM | POA: Diagnosis not present

## 2018-06-16 DIAGNOSIS — F259 Schizoaffective disorder, unspecified: Secondary | ICD-10-CM | POA: Diagnosis not present

## 2018-06-16 DIAGNOSIS — M6281 Muscle weakness (generalized): Secondary | ICD-10-CM | POA: Diagnosis not present

## 2018-06-16 DIAGNOSIS — F0391 Unspecified dementia with behavioral disturbance: Secondary | ICD-10-CM | POA: Diagnosis not present

## 2018-06-17 DIAGNOSIS — F259 Schizoaffective disorder, unspecified: Secondary | ICD-10-CM | POA: Diagnosis not present

## 2018-06-17 DIAGNOSIS — R1311 Dysphagia, oral phase: Secondary | ICD-10-CM | POA: Diagnosis not present

## 2018-06-17 DIAGNOSIS — F0391 Unspecified dementia with behavioral disturbance: Secondary | ICD-10-CM | POA: Diagnosis not present

## 2018-06-17 DIAGNOSIS — F0150 Vascular dementia without behavioral disturbance: Secondary | ICD-10-CM | POA: Diagnosis not present

## 2018-06-17 DIAGNOSIS — M6281 Muscle weakness (generalized): Secondary | ICD-10-CM | POA: Diagnosis not present

## 2018-06-17 DIAGNOSIS — E11311 Type 2 diabetes mellitus with unspecified diabetic retinopathy with macular edema: Secondary | ICD-10-CM | POA: Diagnosis not present

## 2018-06-18 DIAGNOSIS — F0391 Unspecified dementia with behavioral disturbance: Secondary | ICD-10-CM | POA: Diagnosis not present

## 2018-06-18 DIAGNOSIS — E11311 Type 2 diabetes mellitus with unspecified diabetic retinopathy with macular edema: Secondary | ICD-10-CM | POA: Diagnosis not present

## 2018-06-18 DIAGNOSIS — F259 Schizoaffective disorder, unspecified: Secondary | ICD-10-CM | POA: Diagnosis not present

## 2018-06-18 DIAGNOSIS — M6281 Muscle weakness (generalized): Secondary | ICD-10-CM | POA: Diagnosis not present

## 2018-06-18 DIAGNOSIS — R1311 Dysphagia, oral phase: Secondary | ICD-10-CM | POA: Diagnosis not present

## 2018-06-18 DIAGNOSIS — F0150 Vascular dementia without behavioral disturbance: Secondary | ICD-10-CM | POA: Diagnosis not present

## 2018-06-19 DIAGNOSIS — F0391 Unspecified dementia with behavioral disturbance: Secondary | ICD-10-CM | POA: Diagnosis not present

## 2018-06-19 DIAGNOSIS — F259 Schizoaffective disorder, unspecified: Secondary | ICD-10-CM | POA: Diagnosis not present

## 2018-06-19 DIAGNOSIS — F0150 Vascular dementia without behavioral disturbance: Secondary | ICD-10-CM | POA: Diagnosis not present

## 2018-06-19 DIAGNOSIS — M6281 Muscle weakness (generalized): Secondary | ICD-10-CM | POA: Diagnosis not present

## 2018-06-19 DIAGNOSIS — R1311 Dysphagia, oral phase: Secondary | ICD-10-CM | POA: Diagnosis not present

## 2018-06-19 DIAGNOSIS — E11311 Type 2 diabetes mellitus with unspecified diabetic retinopathy with macular edema: Secondary | ICD-10-CM | POA: Diagnosis not present

## 2018-06-20 DIAGNOSIS — R1311 Dysphagia, oral phase: Secondary | ICD-10-CM | POA: Diagnosis not present

## 2018-06-20 DIAGNOSIS — E11311 Type 2 diabetes mellitus with unspecified diabetic retinopathy with macular edema: Secondary | ICD-10-CM | POA: Diagnosis not present

## 2018-06-20 DIAGNOSIS — M6281 Muscle weakness (generalized): Secondary | ICD-10-CM | POA: Diagnosis not present

## 2018-06-20 DIAGNOSIS — F259 Schizoaffective disorder, unspecified: Secondary | ICD-10-CM | POA: Diagnosis not present

## 2018-06-20 DIAGNOSIS — F0391 Unspecified dementia with behavioral disturbance: Secondary | ICD-10-CM | POA: Diagnosis not present

## 2018-06-20 DIAGNOSIS — F0150 Vascular dementia without behavioral disturbance: Secondary | ICD-10-CM | POA: Diagnosis not present

## 2018-06-23 DIAGNOSIS — R1311 Dysphagia, oral phase: Secondary | ICD-10-CM | POA: Diagnosis not present

## 2018-06-23 DIAGNOSIS — L603 Nail dystrophy: Secondary | ICD-10-CM | POA: Diagnosis not present

## 2018-06-23 DIAGNOSIS — E11311 Type 2 diabetes mellitus with unspecified diabetic retinopathy with macular edema: Secondary | ICD-10-CM | POA: Diagnosis not present

## 2018-06-23 DIAGNOSIS — E1151 Type 2 diabetes mellitus with diabetic peripheral angiopathy without gangrene: Secondary | ICD-10-CM | POA: Diagnosis not present

## 2018-06-23 DIAGNOSIS — F259 Schizoaffective disorder, unspecified: Secondary | ICD-10-CM | POA: Diagnosis not present

## 2018-06-23 DIAGNOSIS — B351 Tinea unguium: Secondary | ICD-10-CM | POA: Diagnosis not present

## 2018-06-23 DIAGNOSIS — M6281 Muscle weakness (generalized): Secondary | ICD-10-CM | POA: Diagnosis not present

## 2018-06-23 DIAGNOSIS — F0150 Vascular dementia without behavioral disturbance: Secondary | ICD-10-CM | POA: Diagnosis not present

## 2018-06-23 DIAGNOSIS — F0391 Unspecified dementia with behavioral disturbance: Secondary | ICD-10-CM | POA: Diagnosis not present

## 2018-06-24 DIAGNOSIS — F0150 Vascular dementia without behavioral disturbance: Secondary | ICD-10-CM | POA: Diagnosis not present

## 2018-06-24 DIAGNOSIS — R1311 Dysphagia, oral phase: Secondary | ICD-10-CM | POA: Diagnosis not present

## 2018-06-24 DIAGNOSIS — E11311 Type 2 diabetes mellitus with unspecified diabetic retinopathy with macular edema: Secondary | ICD-10-CM | POA: Diagnosis not present

## 2018-06-24 DIAGNOSIS — M6281 Muscle weakness (generalized): Secondary | ICD-10-CM | POA: Diagnosis not present

## 2018-06-24 DIAGNOSIS — F0391 Unspecified dementia with behavioral disturbance: Secondary | ICD-10-CM | POA: Diagnosis not present

## 2018-06-24 DIAGNOSIS — F259 Schizoaffective disorder, unspecified: Secondary | ICD-10-CM | POA: Diagnosis not present

## 2018-06-25 DIAGNOSIS — F259 Schizoaffective disorder, unspecified: Secondary | ICD-10-CM | POA: Diagnosis not present

## 2018-06-25 DIAGNOSIS — M6281 Muscle weakness (generalized): Secondary | ICD-10-CM | POA: Diagnosis not present

## 2018-06-25 DIAGNOSIS — F0150 Vascular dementia without behavioral disturbance: Secondary | ICD-10-CM | POA: Diagnosis not present

## 2018-06-25 DIAGNOSIS — R1311 Dysphagia, oral phase: Secondary | ICD-10-CM | POA: Diagnosis not present

## 2018-06-25 DIAGNOSIS — F0391 Unspecified dementia with behavioral disturbance: Secondary | ICD-10-CM | POA: Diagnosis not present

## 2018-06-25 DIAGNOSIS — E11311 Type 2 diabetes mellitus with unspecified diabetic retinopathy with macular edema: Secondary | ICD-10-CM | POA: Diagnosis not present

## 2018-06-26 DIAGNOSIS — R1311 Dysphagia, oral phase: Secondary | ICD-10-CM | POA: Diagnosis not present

## 2018-06-26 DIAGNOSIS — F259 Schizoaffective disorder, unspecified: Secondary | ICD-10-CM | POA: Diagnosis not present

## 2018-06-26 DIAGNOSIS — M6281 Muscle weakness (generalized): Secondary | ICD-10-CM | POA: Diagnosis not present

## 2018-06-26 DIAGNOSIS — F0150 Vascular dementia without behavioral disturbance: Secondary | ICD-10-CM | POA: Diagnosis not present

## 2018-06-26 DIAGNOSIS — F0391 Unspecified dementia with behavioral disturbance: Secondary | ICD-10-CM | POA: Diagnosis not present

## 2018-06-26 DIAGNOSIS — I10 Essential (primary) hypertension: Secondary | ICD-10-CM | POA: Diagnosis not present

## 2018-06-26 DIAGNOSIS — E782 Mixed hyperlipidemia: Secondary | ICD-10-CM | POA: Diagnosis not present

## 2018-06-26 DIAGNOSIS — E11311 Type 2 diabetes mellitus with unspecified diabetic retinopathy with macular edema: Secondary | ICD-10-CM | POA: Diagnosis not present

## 2018-06-27 DIAGNOSIS — F0391 Unspecified dementia with behavioral disturbance: Secondary | ICD-10-CM | POA: Diagnosis not present

## 2018-06-27 DIAGNOSIS — E11311 Type 2 diabetes mellitus with unspecified diabetic retinopathy with macular edema: Secondary | ICD-10-CM | POA: Diagnosis not present

## 2018-06-27 DIAGNOSIS — F0150 Vascular dementia without behavioral disturbance: Secondary | ICD-10-CM | POA: Diagnosis not present

## 2018-06-27 DIAGNOSIS — F259 Schizoaffective disorder, unspecified: Secondary | ICD-10-CM | POA: Diagnosis not present

## 2018-06-27 DIAGNOSIS — M6281 Muscle weakness (generalized): Secondary | ICD-10-CM | POA: Diagnosis not present

## 2018-06-27 DIAGNOSIS — R1311 Dysphagia, oral phase: Secondary | ICD-10-CM | POA: Diagnosis not present

## 2018-06-30 DIAGNOSIS — F0391 Unspecified dementia with behavioral disturbance: Secondary | ICD-10-CM | POA: Diagnosis not present

## 2018-06-30 DIAGNOSIS — M6281 Muscle weakness (generalized): Secondary | ICD-10-CM | POA: Diagnosis not present

## 2018-06-30 DIAGNOSIS — E11311 Type 2 diabetes mellitus with unspecified diabetic retinopathy with macular edema: Secondary | ICD-10-CM | POA: Diagnosis not present

## 2018-06-30 DIAGNOSIS — R1311 Dysphagia, oral phase: Secondary | ICD-10-CM | POA: Diagnosis not present

## 2018-06-30 DIAGNOSIS — F0150 Vascular dementia without behavioral disturbance: Secondary | ICD-10-CM | POA: Diagnosis not present

## 2018-06-30 DIAGNOSIS — F259 Schizoaffective disorder, unspecified: Secondary | ICD-10-CM | POA: Diagnosis not present

## 2018-07-01 DIAGNOSIS — F259 Schizoaffective disorder, unspecified: Secondary | ICD-10-CM | POA: Diagnosis not present

## 2018-07-01 DIAGNOSIS — R1311 Dysphagia, oral phase: Secondary | ICD-10-CM | POA: Diagnosis not present

## 2018-07-01 DIAGNOSIS — F0391 Unspecified dementia with behavioral disturbance: Secondary | ICD-10-CM | POA: Diagnosis not present

## 2018-07-01 DIAGNOSIS — E11311 Type 2 diabetes mellitus with unspecified diabetic retinopathy with macular edema: Secondary | ICD-10-CM | POA: Diagnosis not present

## 2018-07-01 DIAGNOSIS — M6281 Muscle weakness (generalized): Secondary | ICD-10-CM | POA: Diagnosis not present

## 2018-07-01 DIAGNOSIS — F0150 Vascular dementia without behavioral disturbance: Secondary | ICD-10-CM | POA: Diagnosis not present

## 2018-07-02 DIAGNOSIS — E11311 Type 2 diabetes mellitus with unspecified diabetic retinopathy with macular edema: Secondary | ICD-10-CM | POA: Diagnosis not present

## 2018-07-02 DIAGNOSIS — R1311 Dysphagia, oral phase: Secondary | ICD-10-CM | POA: Diagnosis not present

## 2018-07-02 DIAGNOSIS — F0150 Vascular dementia without behavioral disturbance: Secondary | ICD-10-CM | POA: Diagnosis not present

## 2018-07-02 DIAGNOSIS — F0391 Unspecified dementia with behavioral disturbance: Secondary | ICD-10-CM | POA: Diagnosis not present

## 2018-07-02 DIAGNOSIS — F259 Schizoaffective disorder, unspecified: Secondary | ICD-10-CM | POA: Diagnosis not present

## 2018-07-02 DIAGNOSIS — M6281 Muscle weakness (generalized): Secondary | ICD-10-CM | POA: Diagnosis not present

## 2018-07-03 DIAGNOSIS — E11311 Type 2 diabetes mellitus with unspecified diabetic retinopathy with macular edema: Secondary | ICD-10-CM | POA: Diagnosis not present

## 2018-07-03 DIAGNOSIS — M6281 Muscle weakness (generalized): Secondary | ICD-10-CM | POA: Diagnosis not present

## 2018-07-03 DIAGNOSIS — R1311 Dysphagia, oral phase: Secondary | ICD-10-CM | POA: Diagnosis not present

## 2018-07-03 DIAGNOSIS — F0391 Unspecified dementia with behavioral disturbance: Secondary | ICD-10-CM | POA: Diagnosis not present

## 2018-07-03 DIAGNOSIS — F259 Schizoaffective disorder, unspecified: Secondary | ICD-10-CM | POA: Diagnosis not present

## 2018-07-03 DIAGNOSIS — F0150 Vascular dementia without behavioral disturbance: Secondary | ICD-10-CM | POA: Diagnosis not present

## 2018-07-04 DIAGNOSIS — F0150 Vascular dementia without behavioral disturbance: Secondary | ICD-10-CM | POA: Diagnosis not present

## 2018-07-04 DIAGNOSIS — F259 Schizoaffective disorder, unspecified: Secondary | ICD-10-CM | POA: Diagnosis not present

## 2018-07-04 DIAGNOSIS — R1311 Dysphagia, oral phase: Secondary | ICD-10-CM | POA: Diagnosis not present

## 2018-07-04 DIAGNOSIS — E11311 Type 2 diabetes mellitus with unspecified diabetic retinopathy with macular edema: Secondary | ICD-10-CM | POA: Diagnosis not present

## 2018-07-04 DIAGNOSIS — F0391 Unspecified dementia with behavioral disturbance: Secondary | ICD-10-CM | POA: Diagnosis not present

## 2018-07-04 DIAGNOSIS — M6281 Muscle weakness (generalized): Secondary | ICD-10-CM | POA: Diagnosis not present

## 2018-07-07 DIAGNOSIS — I10 Essential (primary) hypertension: Secondary | ICD-10-CM | POA: Diagnosis not present

## 2018-07-07 DIAGNOSIS — M6281 Muscle weakness (generalized): Secondary | ICD-10-CM | POA: Diagnosis not present

## 2018-07-07 DIAGNOSIS — E11311 Type 2 diabetes mellitus with unspecified diabetic retinopathy with macular edema: Secondary | ICD-10-CM | POA: Diagnosis not present

## 2018-07-07 DIAGNOSIS — F259 Schizoaffective disorder, unspecified: Secondary | ICD-10-CM | POA: Diagnosis not present

## 2018-07-07 DIAGNOSIS — F0150 Vascular dementia without behavioral disturbance: Secondary | ICD-10-CM | POA: Diagnosis not present

## 2018-07-07 DIAGNOSIS — F0391 Unspecified dementia with behavioral disturbance: Secondary | ICD-10-CM | POA: Diagnosis not present

## 2018-07-07 DIAGNOSIS — E782 Mixed hyperlipidemia: Secondary | ICD-10-CM | POA: Diagnosis not present

## 2018-07-07 DIAGNOSIS — R1311 Dysphagia, oral phase: Secondary | ICD-10-CM | POA: Diagnosis not present

## 2018-07-07 IMAGING — MR MR HEAD W/O CM
9 of 12 series · 32 of 48 positions shown · non-contrast
Comparison: CT head earlier today. Also multiple prior CT head
scans, including 09/30/2009.

CLINICAL DATA: Slurred speech and weakness developed earlier today.
Schizophrenia. Cognitive impairment. Hypertension and diabetes.

EXAM:
MRI HEAD WITHOUT CONTRAST
TECHNIQUE: Multiplanar, multiecho pulse sequences of the brain and surrounding
structures were obtained without intravenous contrast.

[Series 3: DWI · axial · 3.0mm · 0.94mm/px · z∈[+4,+146]mm · 7 of 100 slices shown (1 of 2)]
[im 1/100]
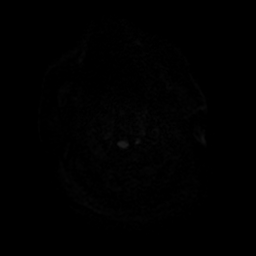
[im 17/100]
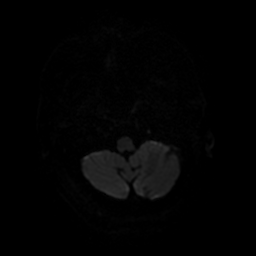
[im 34/100]
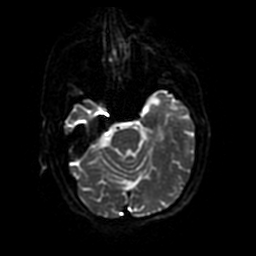
[im 50/100]
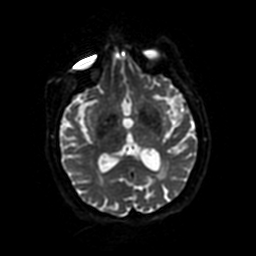
[im 67/100]
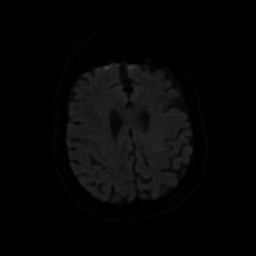
[im 83/100]
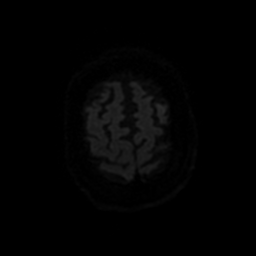
[im 100/100]
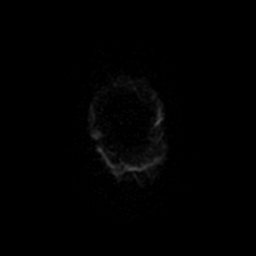

[Series 4: FLAIR · sagittal · 5.0mm · 0.47mm/px · 2 of 24 slices shown (1 of 2)]
[im 1/24]
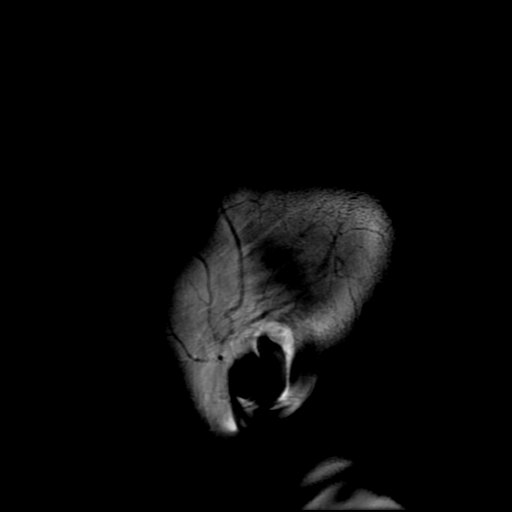
[im 24/24]
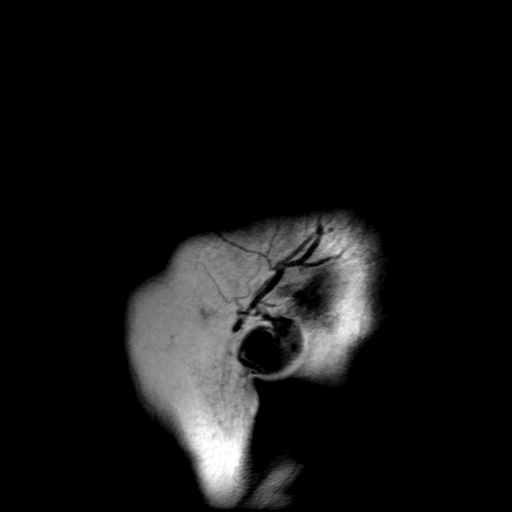

[Series 5: FLAIR · axial · 5.0mm · 0.47mm/px · z∈[+2,+145]mm · 2 of 26 slices shown (2 of 2)]
[im 1/26]
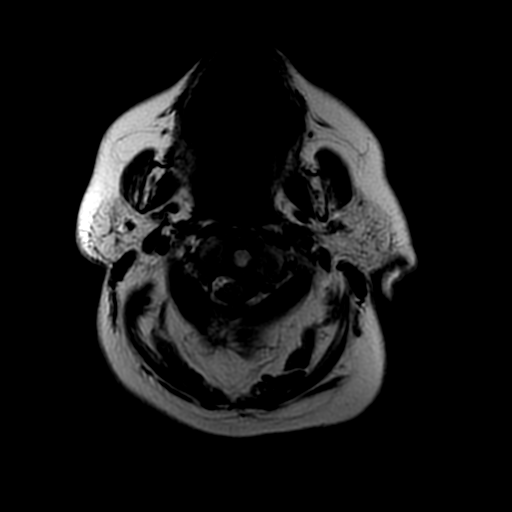
[im 26/26]
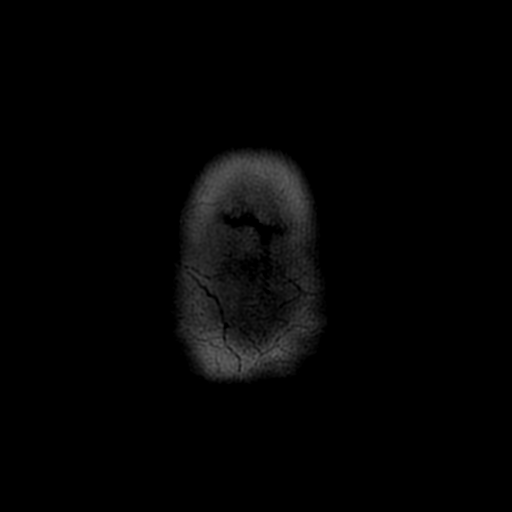

[Series 7: T2 · axial · 5.0mm · 0.47mm/px · z∈[+2,+145]mm · 2 of 26 slices shown (1 of 2)]
[im 1/26]
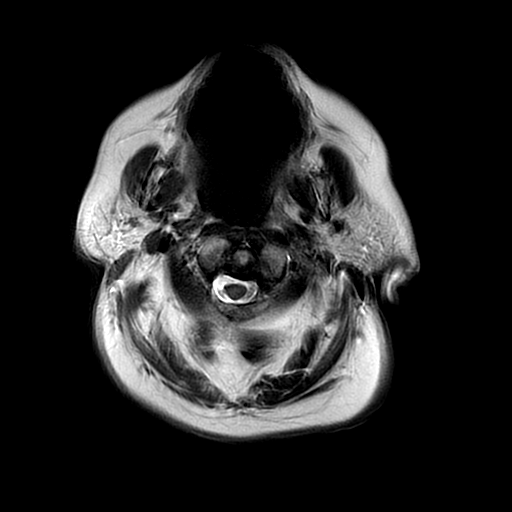
[im 26/26]
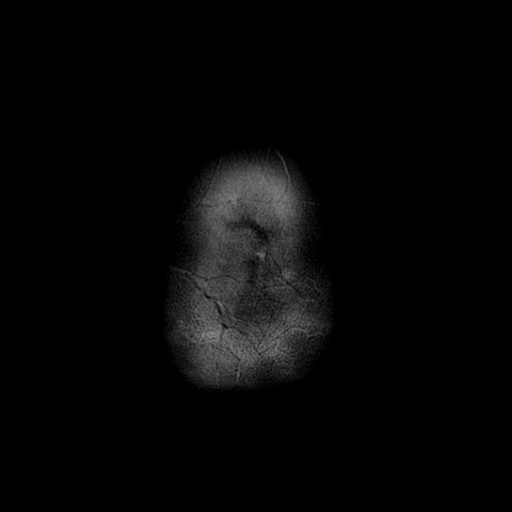

[Series 8: (person_name) · axial · 3.0mm · 0.47mm/px · z∈[+9,+108]mm · 6 of 100 slices shown]
[im 1/100]
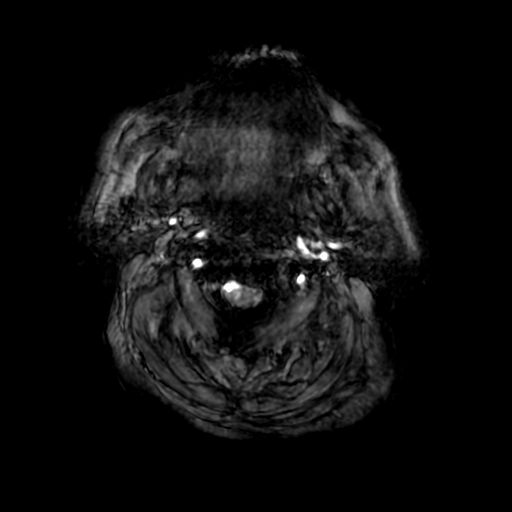
[im 15/100]
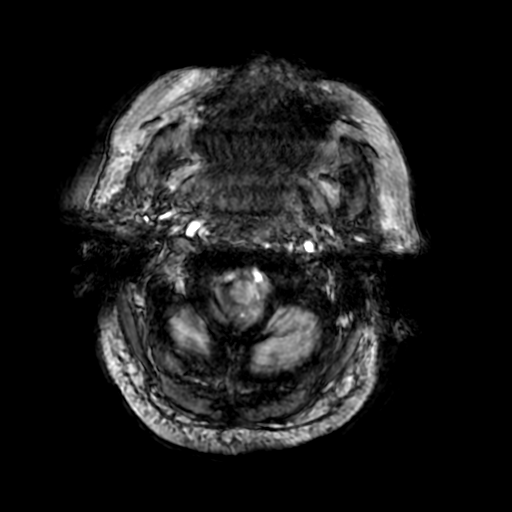
[im 29/100]
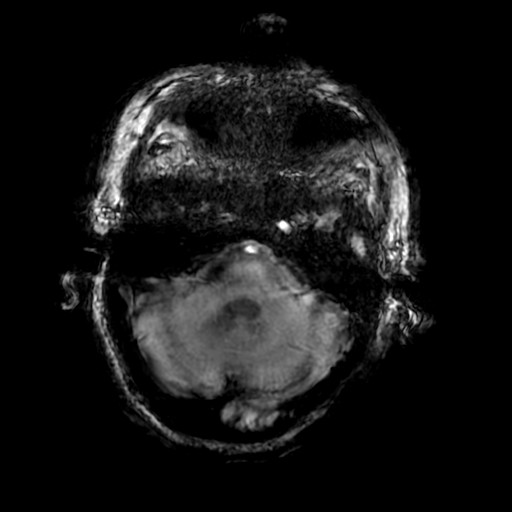
[im 43/100]
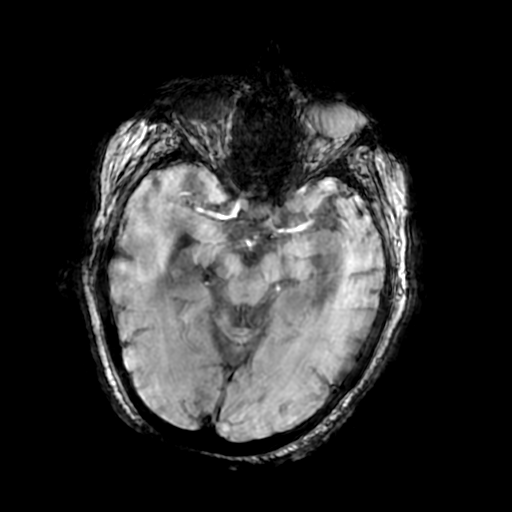
[im 57/100]
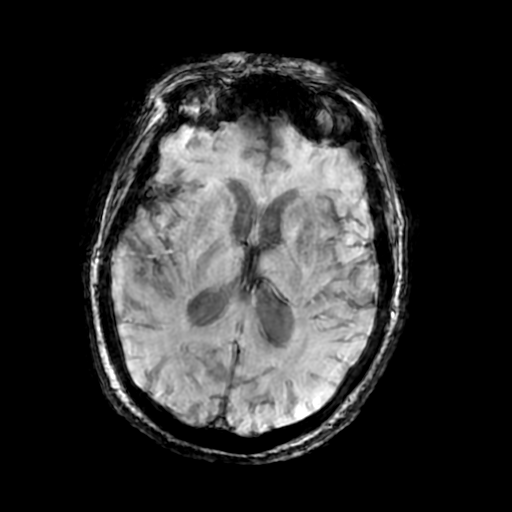
[im 71/100]
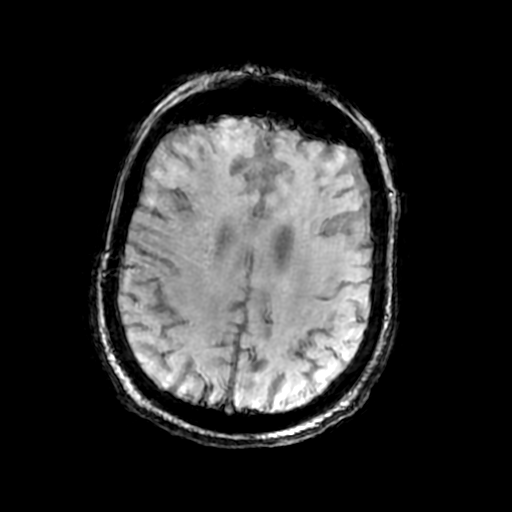

[Series 9: DWI · coronal · 5.0mm · 0.94mm/px · 5 of 65 slices shown (2 of 2)]
[im 1/65]
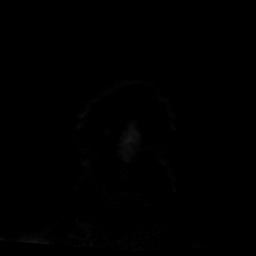
[im 17/65]
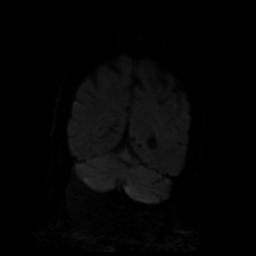
[im 33/65]
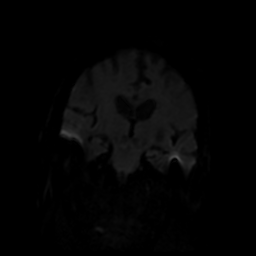
[im 49/65]
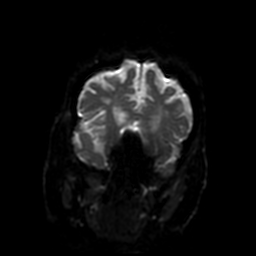
[im 65/65]
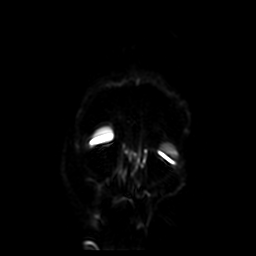

[Series 11: T2 · coronal · 5.0mm · 0.39mm/px · 2 of 28 slices shown (2 of 2)]
[im 1/28]
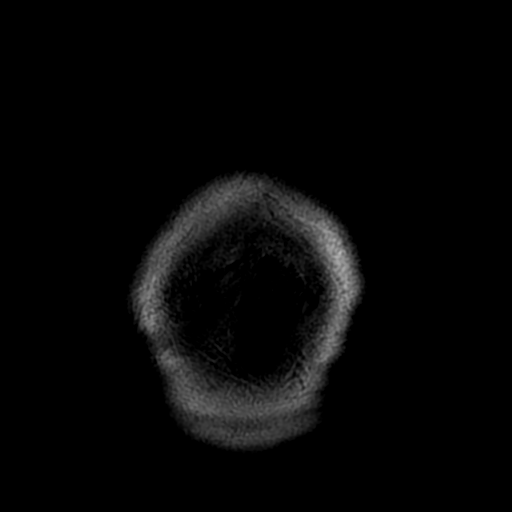
[im 28/28]
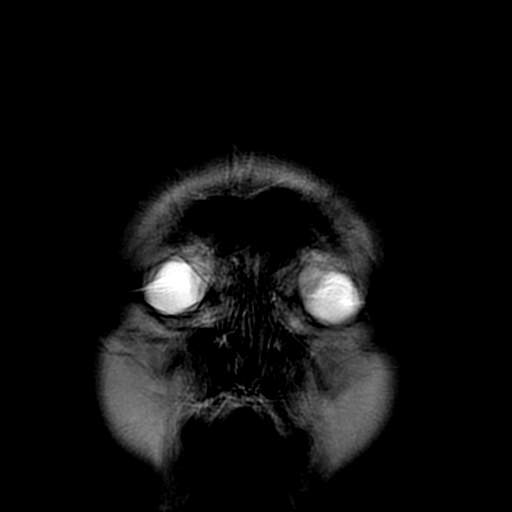

[Series 350: ADC · axial · 3.0mm · 0.94mm/px · z∈[+4,+146]mm · 4 of 50 slices shown (1 of 2)]
[im 1/50]
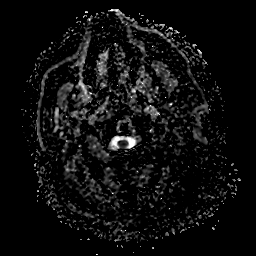
[im 17/50]
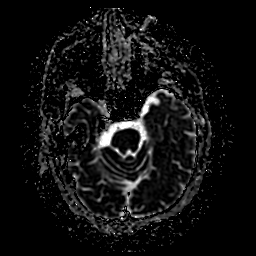
[im 33/50]
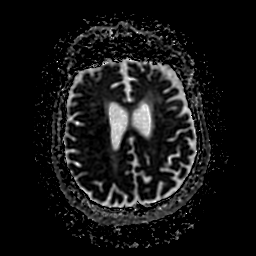
[im 50/50]
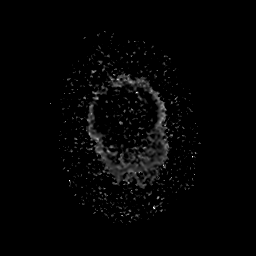

[Series 950: ADC · coronal · 5.0mm · 0.94mm/px · 2 of 33 slices shown (2 of 2)]
[im 1/33]
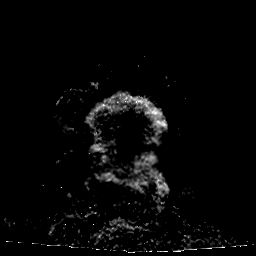
[im 33/33]
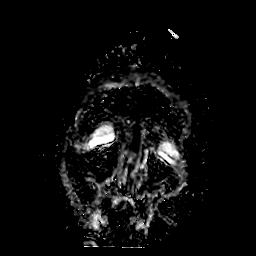

[32 of 48 positions shown; findings below may reference images not displayed]

FINDINGS: The study is motion degraded but diagnostic for the exclusion of
stroke.

Brain: No evidence for acute infarction, acute hemorrhage, mass
lesion, or extra-axial fluid. Global atrophy, hydrocephalus ex
vacuo. Extensive T2 and FLAIR hyperintensities throughout the white
matter consistent with chronic microvascular ischemic change.

Vascular: Normal flow voids.

Skull and upper cervical spine: Normal marrow signal.

Sinuses/Orbits: BILATERAL cataract extraction.  No layering fluid.

Other: Small mastoid effusions, without nasopharyngeal mass, non
worrisome.

Compared with scans since 4899 and 1781, atrophy has progressed.
IMPRESSION: Atrophy and small vessel disease, similar to priors. No acute stroke
is evident.

## 2018-07-07 IMAGING — DX DG CHEST 2V
2 series · 2 of 2 positions shown · non-contrast
Comparison: Chest radiograph August 14, 2015

CLINICAL DATA: Slurred speech and weakness this morning.

EXAM:
CHEST  2 VIEW

[x chest ap]
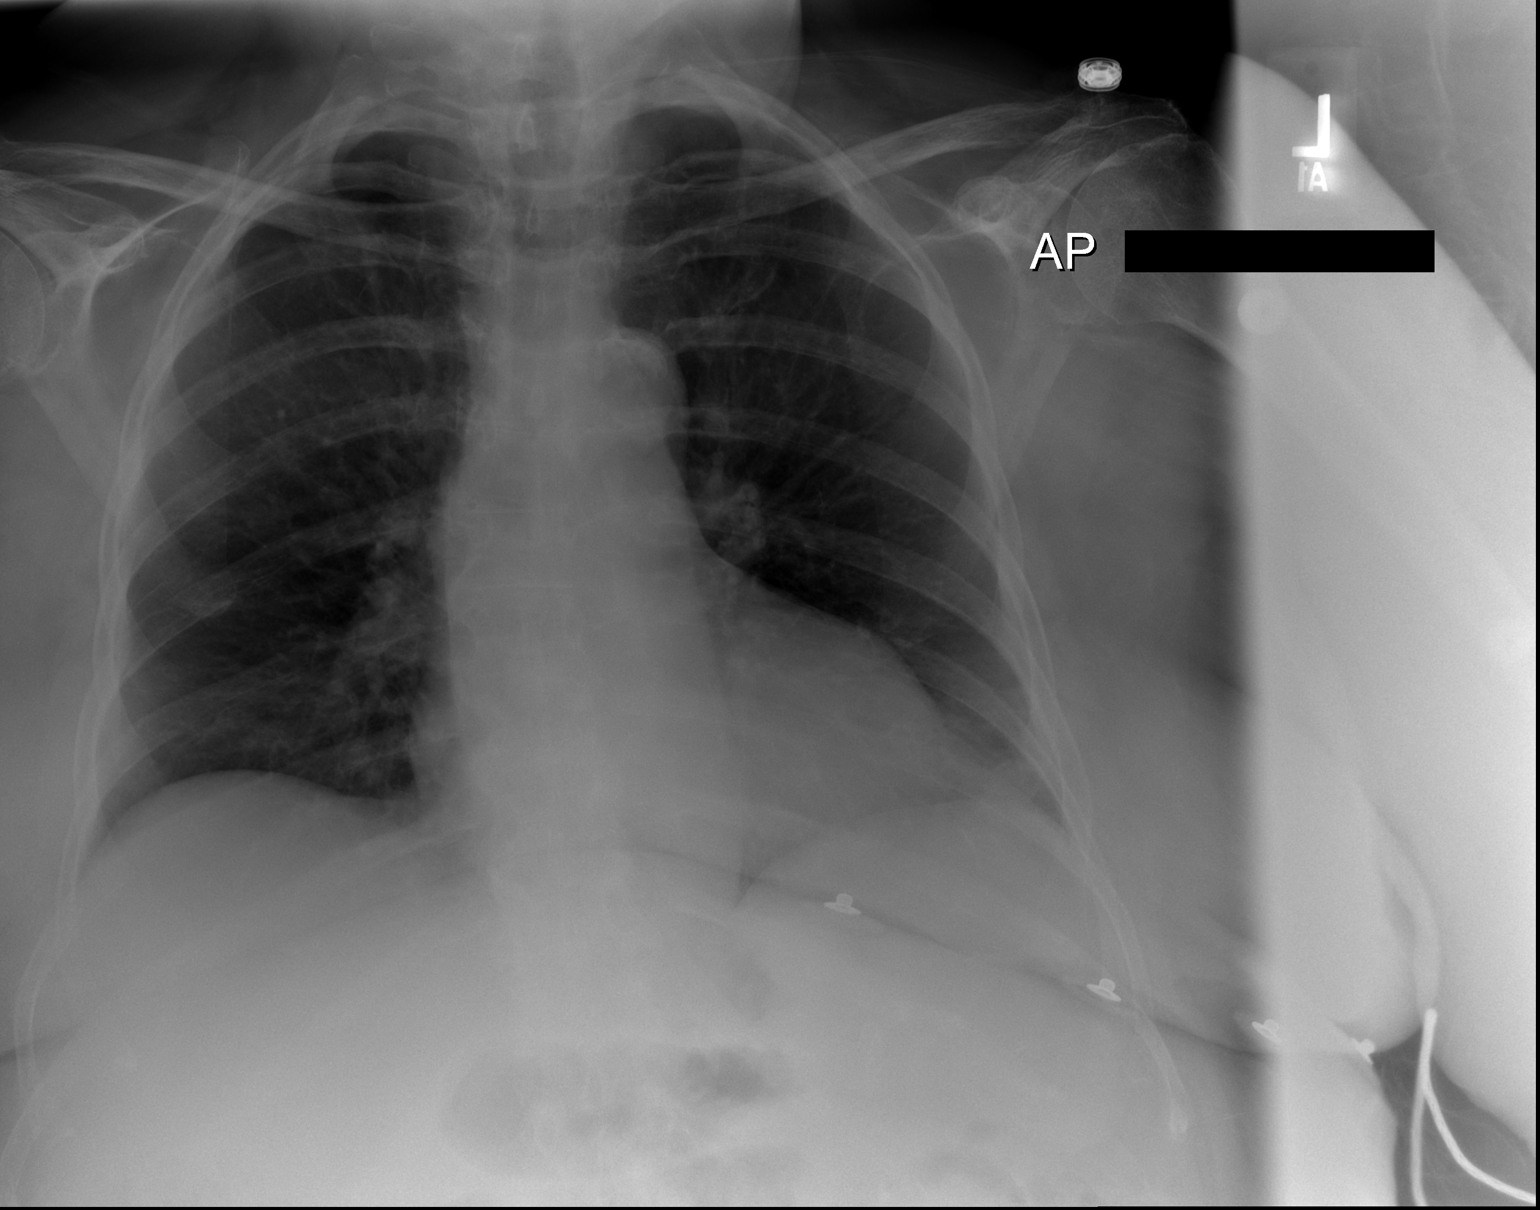

[w chest lat]
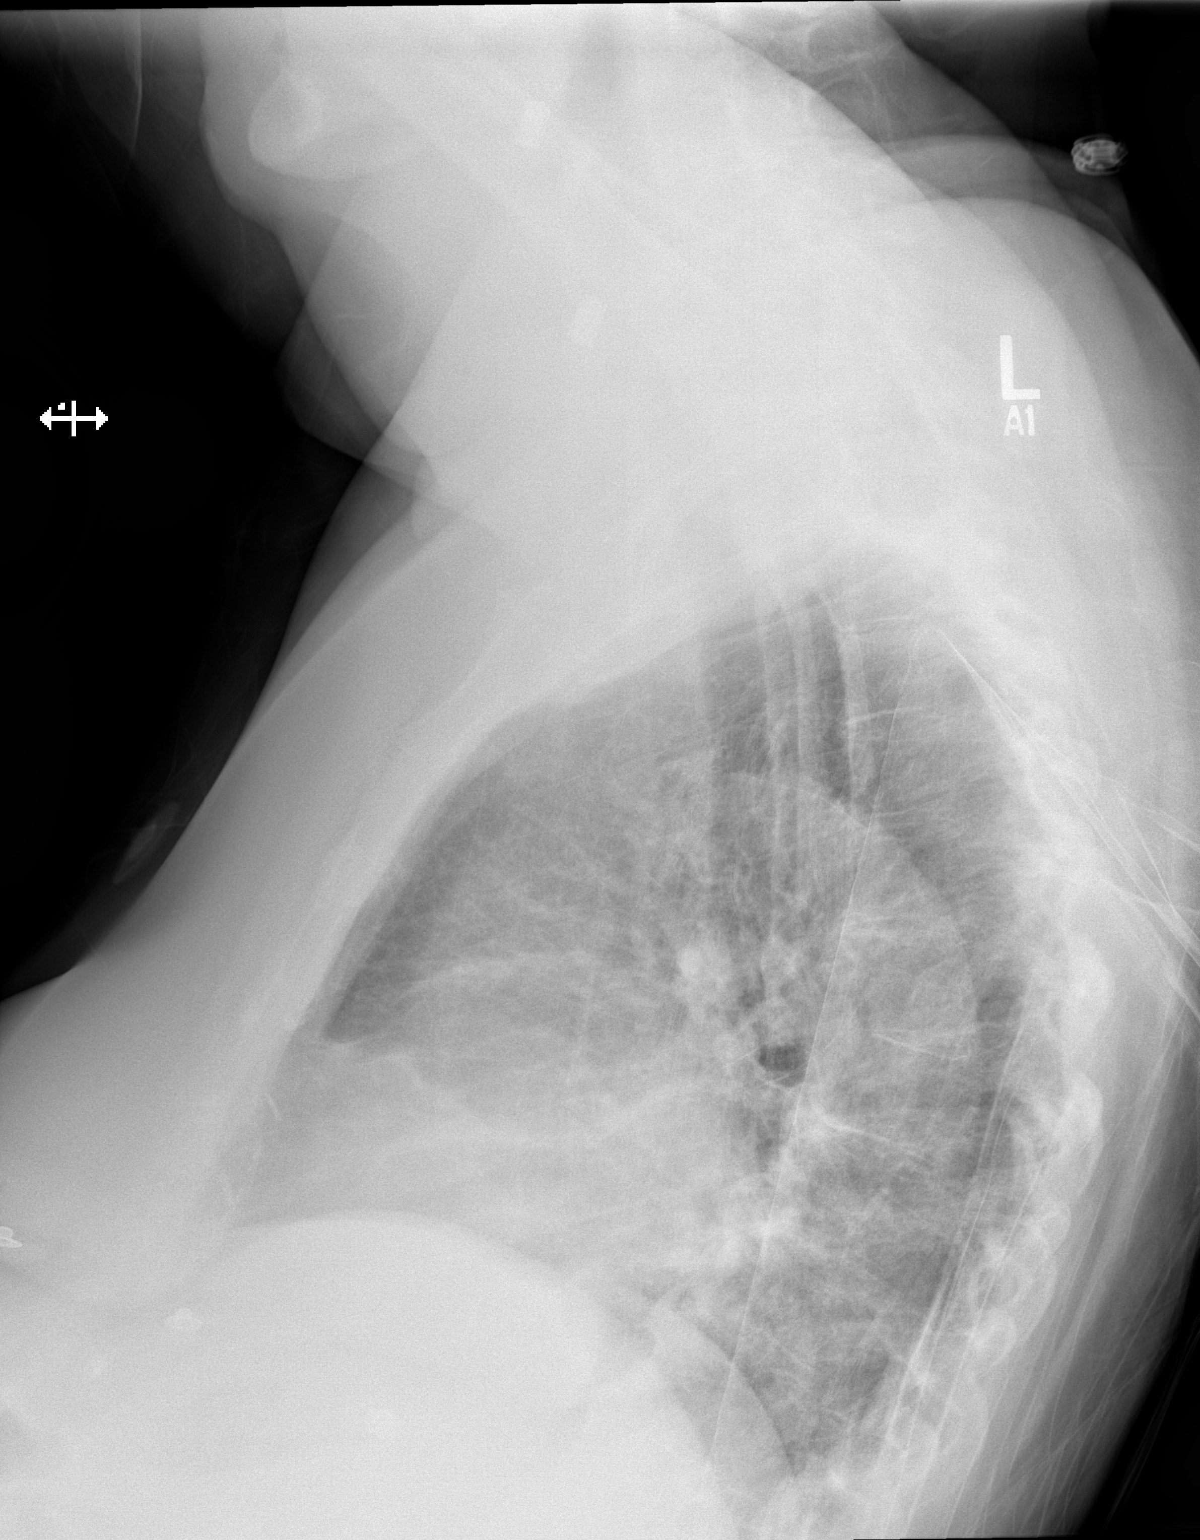

[2 of 2 positions shown; findings below may reference images not displayed]

FINDINGS: Cardiomediastinal silhouette is normal. Low lung volumes with
crowded vascular markings. No pleural effusions or focal
consolidations. Trachea projects midline and there is no
pneumothorax. Soft tissue planes and included osseous structures are
non-suspicious.
IMPRESSION: No active cardiopulmonary disease.

## 2018-07-08 DIAGNOSIS — F259 Schizoaffective disorder, unspecified: Secondary | ICD-10-CM | POA: Diagnosis not present

## 2018-07-08 DIAGNOSIS — F0391 Unspecified dementia with behavioral disturbance: Secondary | ICD-10-CM | POA: Diagnosis not present

## 2018-07-08 DIAGNOSIS — M6281 Muscle weakness (generalized): Secondary | ICD-10-CM | POA: Diagnosis not present

## 2018-07-08 DIAGNOSIS — F0150 Vascular dementia without behavioral disturbance: Secondary | ICD-10-CM | POA: Diagnosis not present

## 2018-07-08 DIAGNOSIS — E11311 Type 2 diabetes mellitus with unspecified diabetic retinopathy with macular edema: Secondary | ICD-10-CM | POA: Diagnosis not present

## 2018-07-08 DIAGNOSIS — R1311 Dysphagia, oral phase: Secondary | ICD-10-CM | POA: Diagnosis not present

## 2018-07-10 DIAGNOSIS — F0151 Vascular dementia with behavioral disturbance: Secondary | ICD-10-CM | POA: Diagnosis not present

## 2018-07-10 DIAGNOSIS — F251 Schizoaffective disorder, depressive type: Secondary | ICD-10-CM | POA: Diagnosis not present

## 2018-07-24 DIAGNOSIS — E782 Mixed hyperlipidemia: Secondary | ICD-10-CM | POA: Diagnosis not present

## 2018-07-24 DIAGNOSIS — E11311 Type 2 diabetes mellitus with unspecified diabetic retinopathy with macular edema: Secondary | ICD-10-CM | POA: Diagnosis not present

## 2018-07-24 DIAGNOSIS — F0391 Unspecified dementia with behavioral disturbance: Secondary | ICD-10-CM | POA: Diagnosis not present

## 2018-07-24 DIAGNOSIS — F0151 Vascular dementia with behavioral disturbance: Secondary | ICD-10-CM | POA: Diagnosis not present

## 2018-07-24 DIAGNOSIS — F251 Schizoaffective disorder, depressive type: Secondary | ICD-10-CM | POA: Diagnosis not present

## 2018-07-24 DIAGNOSIS — I10 Essential (primary) hypertension: Secondary | ICD-10-CM | POA: Diagnosis not present

## 2018-08-07 DIAGNOSIS — F0151 Vascular dementia with behavioral disturbance: Secondary | ICD-10-CM | POA: Diagnosis not present

## 2018-08-07 DIAGNOSIS — F251 Schizoaffective disorder, depressive type: Secondary | ICD-10-CM | POA: Diagnosis not present

## 2018-08-08 DIAGNOSIS — E11311 Type 2 diabetes mellitus with unspecified diabetic retinopathy with macular edema: Secondary | ICD-10-CM | POA: Diagnosis not present

## 2018-08-08 DIAGNOSIS — F0391 Unspecified dementia with behavioral disturbance: Secondary | ICD-10-CM | POA: Diagnosis not present

## 2018-08-08 DIAGNOSIS — I10 Essential (primary) hypertension: Secondary | ICD-10-CM | POA: Diagnosis not present

## 2018-08-08 DIAGNOSIS — E782 Mixed hyperlipidemia: Secondary | ICD-10-CM | POA: Diagnosis not present

## 2018-08-21 DIAGNOSIS — M159 Polyosteoarthritis, unspecified: Secondary | ICD-10-CM | POA: Diagnosis not present

## 2018-08-21 DIAGNOSIS — F0391 Unspecified dementia with behavioral disturbance: Secondary | ICD-10-CM | POA: Diagnosis not present

## 2018-08-21 DIAGNOSIS — E11311 Type 2 diabetes mellitus with unspecified diabetic retinopathy with macular edema: Secondary | ICD-10-CM | POA: Diagnosis not present

## 2018-08-21 DIAGNOSIS — E782 Mixed hyperlipidemia: Secondary | ICD-10-CM | POA: Diagnosis not present

## 2018-09-04 DIAGNOSIS — F0151 Vascular dementia with behavioral disturbance: Secondary | ICD-10-CM | POA: Diagnosis not present

## 2018-09-04 DIAGNOSIS — F251 Schizoaffective disorder, depressive type: Secondary | ICD-10-CM | POA: Diagnosis not present

## 2018-09-05 DIAGNOSIS — I10 Essential (primary) hypertension: Secondary | ICD-10-CM | POA: Diagnosis not present

## 2018-09-05 DIAGNOSIS — E11311 Type 2 diabetes mellitus with unspecified diabetic retinopathy with macular edema: Secondary | ICD-10-CM | POA: Diagnosis not present

## 2018-09-05 DIAGNOSIS — F0391 Unspecified dementia with behavioral disturbance: Secondary | ICD-10-CM | POA: Diagnosis not present

## 2018-09-05 DIAGNOSIS — E782 Mixed hyperlipidemia: Secondary | ICD-10-CM | POA: Diagnosis not present

## 2018-09-11 DIAGNOSIS — D649 Anemia, unspecified: Secondary | ICD-10-CM | POA: Diagnosis not present

## 2018-09-11 DIAGNOSIS — E11311 Type 2 diabetes mellitus with unspecified diabetic retinopathy with macular edema: Secondary | ICD-10-CM | POA: Diagnosis not present

## 2018-09-11 DIAGNOSIS — E039 Hypothyroidism, unspecified: Secondary | ICD-10-CM | POA: Diagnosis not present

## 2018-09-11 DIAGNOSIS — E559 Vitamin D deficiency, unspecified: Secondary | ICD-10-CM | POA: Diagnosis not present

## 2018-09-18 DIAGNOSIS — F0391 Unspecified dementia with behavioral disturbance: Secondary | ICD-10-CM | POA: Diagnosis not present

## 2018-09-18 DIAGNOSIS — E11311 Type 2 diabetes mellitus with unspecified diabetic retinopathy with macular edema: Secondary | ICD-10-CM | POA: Diagnosis not present

## 2018-09-18 DIAGNOSIS — I10 Essential (primary) hypertension: Secondary | ICD-10-CM | POA: Diagnosis not present

## 2018-09-18 DIAGNOSIS — E782 Mixed hyperlipidemia: Secondary | ICD-10-CM | POA: Diagnosis not present

## 2018-10-02 DIAGNOSIS — F0151 Vascular dementia with behavioral disturbance: Secondary | ICD-10-CM | POA: Diagnosis not present

## 2018-10-02 DIAGNOSIS — F251 Schizoaffective disorder, depressive type: Secondary | ICD-10-CM | POA: Diagnosis not present

## 2018-10-03 DIAGNOSIS — I10 Essential (primary) hypertension: Secondary | ICD-10-CM | POA: Diagnosis not present

## 2018-10-03 DIAGNOSIS — K219 Gastro-esophageal reflux disease without esophagitis: Secondary | ICD-10-CM | POA: Diagnosis not present

## 2018-10-03 DIAGNOSIS — E782 Mixed hyperlipidemia: Secondary | ICD-10-CM | POA: Diagnosis not present

## 2018-10-03 DIAGNOSIS — E11311 Type 2 diabetes mellitus with unspecified diabetic retinopathy with macular edema: Secondary | ICD-10-CM | POA: Diagnosis not present

## 2018-10-05 DIAGNOSIS — I10 Essential (primary) hypertension: Secondary | ICD-10-CM | POA: Diagnosis not present

## 2018-10-05 DIAGNOSIS — E0837X3 Diabetes mellitus due to underlying condition with diabetic macular edema, resolved following treatment, bilateral: Secondary | ICD-10-CM | POA: Diagnosis not present

## 2018-10-16 DIAGNOSIS — K219 Gastro-esophageal reflux disease without esophagitis: Secondary | ICD-10-CM | POA: Diagnosis not present

## 2018-10-16 DIAGNOSIS — E11311 Type 2 diabetes mellitus with unspecified diabetic retinopathy with macular edema: Secondary | ICD-10-CM | POA: Diagnosis not present

## 2018-10-16 DIAGNOSIS — E782 Mixed hyperlipidemia: Secondary | ICD-10-CM | POA: Diagnosis not present

## 2018-10-16 DIAGNOSIS — I10 Essential (primary) hypertension: Secondary | ICD-10-CM | POA: Diagnosis not present

## 2018-10-28 DIAGNOSIS — F0150 Vascular dementia without behavioral disturbance: Secondary | ICD-10-CM | POA: Diagnosis not present

## 2018-10-28 DIAGNOSIS — R1312 Dysphagia, oropharyngeal phase: Secondary | ICD-10-CM | POA: Diagnosis not present

## 2018-10-28 DIAGNOSIS — Z741 Need for assistance with personal care: Secondary | ICD-10-CM | POA: Diagnosis not present

## 2018-10-28 DIAGNOSIS — F0391 Unspecified dementia with behavioral disturbance: Secondary | ICD-10-CM | POA: Diagnosis not present

## 2018-10-28 DIAGNOSIS — F259 Schizoaffective disorder, unspecified: Secondary | ICD-10-CM | POA: Diagnosis not present

## 2018-10-28 DIAGNOSIS — M6281 Muscle weakness (generalized): Secondary | ICD-10-CM | POA: Diagnosis not present

## 2018-10-29 DIAGNOSIS — R1312 Dysphagia, oropharyngeal phase: Secondary | ICD-10-CM | POA: Diagnosis not present

## 2018-10-29 DIAGNOSIS — F259 Schizoaffective disorder, unspecified: Secondary | ICD-10-CM | POA: Diagnosis not present

## 2018-10-29 DIAGNOSIS — Z741 Need for assistance with personal care: Secondary | ICD-10-CM | POA: Diagnosis not present

## 2018-10-29 DIAGNOSIS — Z7409 Other reduced mobility: Secondary | ICD-10-CM | POA: Diagnosis not present

## 2018-10-29 DIAGNOSIS — M6281 Muscle weakness (generalized): Secondary | ICD-10-CM | POA: Diagnosis not present

## 2018-10-29 DIAGNOSIS — F0150 Vascular dementia without behavioral disturbance: Secondary | ICD-10-CM | POA: Diagnosis not present

## 2018-10-29 DIAGNOSIS — L89323 Pressure ulcer of left buttock, stage 3: Secondary | ICD-10-CM | POA: Diagnosis not present

## 2018-10-29 DIAGNOSIS — F0391 Unspecified dementia with behavioral disturbance: Secondary | ICD-10-CM | POA: Diagnosis not present

## 2018-10-30 DIAGNOSIS — F0391 Unspecified dementia with behavioral disturbance: Secondary | ICD-10-CM | POA: Diagnosis not present

## 2018-10-30 DIAGNOSIS — R1312 Dysphagia, oropharyngeal phase: Secondary | ICD-10-CM | POA: Diagnosis not present

## 2018-10-30 DIAGNOSIS — M6281 Muscle weakness (generalized): Secondary | ICD-10-CM | POA: Diagnosis not present

## 2018-10-30 DIAGNOSIS — F0150 Vascular dementia without behavioral disturbance: Secondary | ICD-10-CM | POA: Diagnosis not present

## 2018-10-30 DIAGNOSIS — F259 Schizoaffective disorder, unspecified: Secondary | ICD-10-CM | POA: Diagnosis not present

## 2018-10-30 DIAGNOSIS — Z741 Need for assistance with personal care: Secondary | ICD-10-CM | POA: Diagnosis not present

## 2018-10-31 DIAGNOSIS — R1312 Dysphagia, oropharyngeal phase: Secondary | ICD-10-CM | POA: Diagnosis not present

## 2018-10-31 DIAGNOSIS — F0150 Vascular dementia without behavioral disturbance: Secondary | ICD-10-CM | POA: Diagnosis not present

## 2018-10-31 DIAGNOSIS — Z741 Need for assistance with personal care: Secondary | ICD-10-CM | POA: Diagnosis not present

## 2018-10-31 DIAGNOSIS — F259 Schizoaffective disorder, unspecified: Secondary | ICD-10-CM | POA: Diagnosis not present

## 2018-10-31 DIAGNOSIS — M6281 Muscle weakness (generalized): Secondary | ICD-10-CM | POA: Diagnosis not present

## 2018-10-31 DIAGNOSIS — F0391 Unspecified dementia with behavioral disturbance: Secondary | ICD-10-CM | POA: Diagnosis not present

## 2018-11-02 DIAGNOSIS — M6281 Muscle weakness (generalized): Secondary | ICD-10-CM | POA: Diagnosis not present

## 2018-11-02 DIAGNOSIS — Z741 Need for assistance with personal care: Secondary | ICD-10-CM | POA: Diagnosis not present

## 2018-11-02 DIAGNOSIS — F259 Schizoaffective disorder, unspecified: Secondary | ICD-10-CM | POA: Diagnosis not present

## 2018-11-02 DIAGNOSIS — R1312 Dysphagia, oropharyngeal phase: Secondary | ICD-10-CM | POA: Diagnosis not present

## 2018-11-02 DIAGNOSIS — F0150 Vascular dementia without behavioral disturbance: Secondary | ICD-10-CM | POA: Diagnosis not present

## 2018-11-02 DIAGNOSIS — F0391 Unspecified dementia with behavioral disturbance: Secondary | ICD-10-CM | POA: Diagnosis not present

## 2018-11-03 DIAGNOSIS — F259 Schizoaffective disorder, unspecified: Secondary | ICD-10-CM | POA: Diagnosis not present

## 2018-11-03 DIAGNOSIS — F0391 Unspecified dementia with behavioral disturbance: Secondary | ICD-10-CM | POA: Diagnosis not present

## 2018-11-03 DIAGNOSIS — Z741 Need for assistance with personal care: Secondary | ICD-10-CM | POA: Diagnosis not present

## 2018-11-03 DIAGNOSIS — M6281 Muscle weakness (generalized): Secondary | ICD-10-CM | POA: Diagnosis not present

## 2018-11-03 DIAGNOSIS — R1312 Dysphagia, oropharyngeal phase: Secondary | ICD-10-CM | POA: Diagnosis not present

## 2018-11-03 DIAGNOSIS — F0150 Vascular dementia without behavioral disturbance: Secondary | ICD-10-CM | POA: Diagnosis not present

## 2018-11-04 DIAGNOSIS — F0391 Unspecified dementia with behavioral disturbance: Secondary | ICD-10-CM | POA: Diagnosis not present

## 2018-11-04 DIAGNOSIS — D649 Anemia, unspecified: Secondary | ICD-10-CM | POA: Diagnosis not present

## 2018-11-04 DIAGNOSIS — J189 Pneumonia, unspecified organism: Secondary | ICD-10-CM | POA: Diagnosis not present

## 2018-11-04 DIAGNOSIS — E11311 Type 2 diabetes mellitus with unspecified diabetic retinopathy with macular edema: Secondary | ICD-10-CM | POA: Diagnosis not present

## 2018-11-04 DIAGNOSIS — I10 Essential (primary) hypertension: Secondary | ICD-10-CM | POA: Diagnosis not present

## 2018-11-04 DIAGNOSIS — F259 Schizoaffective disorder, unspecified: Secondary | ICD-10-CM | POA: Diagnosis not present

## 2018-11-04 DIAGNOSIS — F0150 Vascular dementia without behavioral disturbance: Secondary | ICD-10-CM | POA: Diagnosis not present

## 2018-11-04 DIAGNOSIS — Z741 Need for assistance with personal care: Secondary | ICD-10-CM | POA: Diagnosis not present

## 2018-11-04 DIAGNOSIS — D508 Other iron deficiency anemias: Secondary | ICD-10-CM | POA: Diagnosis not present

## 2018-11-04 DIAGNOSIS — E782 Mixed hyperlipidemia: Secondary | ICD-10-CM | POA: Diagnosis not present

## 2018-11-04 DIAGNOSIS — M6281 Muscle weakness (generalized): Secondary | ICD-10-CM | POA: Diagnosis not present

## 2018-11-04 DIAGNOSIS — R1312 Dysphagia, oropharyngeal phase: Secondary | ICD-10-CM | POA: Diagnosis not present

## 2018-11-05 DIAGNOSIS — Z7409 Other reduced mobility: Secondary | ICD-10-CM | POA: Diagnosis not present

## 2018-11-05 DIAGNOSIS — L89323 Pressure ulcer of left buttock, stage 3: Secondary | ICD-10-CM | POA: Diagnosis not present

## 2018-11-05 DIAGNOSIS — F0391 Unspecified dementia with behavioral disturbance: Secondary | ICD-10-CM | POA: Diagnosis not present

## 2018-11-05 DIAGNOSIS — M6281 Muscle weakness (generalized): Secondary | ICD-10-CM | POA: Diagnosis not present

## 2018-11-05 DIAGNOSIS — R1312 Dysphagia, oropharyngeal phase: Secondary | ICD-10-CM | POA: Diagnosis not present

## 2018-11-05 DIAGNOSIS — F259 Schizoaffective disorder, unspecified: Secondary | ICD-10-CM | POA: Diagnosis not present

## 2018-11-05 DIAGNOSIS — F0150 Vascular dementia without behavioral disturbance: Secondary | ICD-10-CM | POA: Diagnosis not present

## 2018-11-05 DIAGNOSIS — Z741 Need for assistance with personal care: Secondary | ICD-10-CM | POA: Diagnosis not present

## 2018-11-06 DIAGNOSIS — F0150 Vascular dementia without behavioral disturbance: Secondary | ICD-10-CM | POA: Diagnosis not present

## 2018-11-06 DIAGNOSIS — F259 Schizoaffective disorder, unspecified: Secondary | ICD-10-CM | POA: Diagnosis not present

## 2018-11-06 DIAGNOSIS — Z741 Need for assistance with personal care: Secondary | ICD-10-CM | POA: Diagnosis not present

## 2018-11-06 DIAGNOSIS — M6281 Muscle weakness (generalized): Secondary | ICD-10-CM | POA: Diagnosis not present

## 2018-11-06 DIAGNOSIS — D500 Iron deficiency anemia secondary to blood loss (chronic): Secondary | ICD-10-CM | POA: Diagnosis not present

## 2018-11-06 DIAGNOSIS — F251 Schizoaffective disorder, depressive type: Secondary | ICD-10-CM | POA: Diagnosis not present

## 2018-11-06 DIAGNOSIS — R319 Hematuria, unspecified: Secondary | ICD-10-CM | POA: Diagnosis not present

## 2018-11-06 DIAGNOSIS — E11311 Type 2 diabetes mellitus with unspecified diabetic retinopathy with macular edema: Secondary | ICD-10-CM | POA: Diagnosis not present

## 2018-11-06 DIAGNOSIS — R1312 Dysphagia, oropharyngeal phase: Secondary | ICD-10-CM | POA: Diagnosis not present

## 2018-11-06 DIAGNOSIS — F0391 Unspecified dementia with behavioral disturbance: Secondary | ICD-10-CM | POA: Diagnosis not present

## 2018-11-07 DIAGNOSIS — G9340 Encephalopathy, unspecified: Secondary | ICD-10-CM | POA: Diagnosis not present

## 2018-11-07 DIAGNOSIS — I10 Essential (primary) hypertension: Secondary | ICD-10-CM | POA: Diagnosis not present

## 2018-11-07 DIAGNOSIS — F259 Schizoaffective disorder, unspecified: Secondary | ICD-10-CM | POA: Diagnosis not present

## 2018-11-07 DIAGNOSIS — E11311 Type 2 diabetes mellitus with unspecified diabetic retinopathy with macular edema: Secondary | ICD-10-CM | POA: Diagnosis not present

## 2018-11-07 DIAGNOSIS — F0150 Vascular dementia without behavioral disturbance: Secondary | ICD-10-CM | POA: Diagnosis not present

## 2018-11-07 DIAGNOSIS — R627 Adult failure to thrive: Secondary | ICD-10-CM | POA: Diagnosis not present

## 2018-11-07 DIAGNOSIS — R1312 Dysphagia, oropharyngeal phase: Secondary | ICD-10-CM | POA: Diagnosis not present

## 2018-11-07 DIAGNOSIS — F0391 Unspecified dementia with behavioral disturbance: Secondary | ICD-10-CM | POA: Diagnosis not present

## 2018-11-07 DIAGNOSIS — Z741 Need for assistance with personal care: Secondary | ICD-10-CM | POA: Diagnosis not present

## 2018-11-07 DIAGNOSIS — M6281 Muscle weakness (generalized): Secondary | ICD-10-CM | POA: Diagnosis not present

## 2018-11-10 DIAGNOSIS — Z741 Need for assistance with personal care: Secondary | ICD-10-CM | POA: Diagnosis not present

## 2018-11-10 DIAGNOSIS — M6281 Muscle weakness (generalized): Secondary | ICD-10-CM | POA: Diagnosis not present

## 2018-11-10 DIAGNOSIS — R1312 Dysphagia, oropharyngeal phase: Secondary | ICD-10-CM | POA: Diagnosis not present

## 2018-11-10 DIAGNOSIS — F0150 Vascular dementia without behavioral disturbance: Secondary | ICD-10-CM | POA: Diagnosis not present

## 2018-11-10 DIAGNOSIS — F0391 Unspecified dementia with behavioral disturbance: Secondary | ICD-10-CM | POA: Diagnosis not present

## 2018-11-10 DIAGNOSIS — F259 Schizoaffective disorder, unspecified: Secondary | ICD-10-CM | POA: Diagnosis not present

## 2018-11-11 DIAGNOSIS — F0150 Vascular dementia without behavioral disturbance: Secondary | ICD-10-CM | POA: Diagnosis not present

## 2018-11-11 DIAGNOSIS — R1312 Dysphagia, oropharyngeal phase: Secondary | ICD-10-CM | POA: Diagnosis not present

## 2018-11-11 DIAGNOSIS — F259 Schizoaffective disorder, unspecified: Secondary | ICD-10-CM | POA: Diagnosis not present

## 2018-11-11 DIAGNOSIS — Z741 Need for assistance with personal care: Secondary | ICD-10-CM | POA: Diagnosis not present

## 2018-11-11 DIAGNOSIS — F0391 Unspecified dementia with behavioral disturbance: Secondary | ICD-10-CM | POA: Diagnosis not present

## 2018-11-11 DIAGNOSIS — M6281 Muscle weakness (generalized): Secondary | ICD-10-CM | POA: Diagnosis not present

## 2018-11-12 DIAGNOSIS — F0391 Unspecified dementia with behavioral disturbance: Secondary | ICD-10-CM | POA: Diagnosis not present

## 2018-11-12 DIAGNOSIS — F259 Schizoaffective disorder, unspecified: Secondary | ICD-10-CM | POA: Diagnosis not present

## 2018-11-12 DIAGNOSIS — M6281 Muscle weakness (generalized): Secondary | ICD-10-CM | POA: Diagnosis not present

## 2018-11-12 DIAGNOSIS — L89323 Pressure ulcer of left buttock, stage 3: Secondary | ICD-10-CM | POA: Diagnosis not present

## 2018-11-12 DIAGNOSIS — Z7409 Other reduced mobility: Secondary | ICD-10-CM | POA: Diagnosis not present

## 2018-11-12 DIAGNOSIS — Z741 Need for assistance with personal care: Secondary | ICD-10-CM | POA: Diagnosis not present

## 2018-11-12 DIAGNOSIS — R1312 Dysphagia, oropharyngeal phase: Secondary | ICD-10-CM | POA: Diagnosis not present

## 2018-11-12 DIAGNOSIS — F0150 Vascular dementia without behavioral disturbance: Secondary | ICD-10-CM | POA: Diagnosis not present

## 2018-11-13 DIAGNOSIS — F0150 Vascular dementia without behavioral disturbance: Secondary | ICD-10-CM | POA: Diagnosis not present

## 2018-11-13 DIAGNOSIS — Z741 Need for assistance with personal care: Secondary | ICD-10-CM | POA: Diagnosis not present

## 2018-11-13 DIAGNOSIS — F259 Schizoaffective disorder, unspecified: Secondary | ICD-10-CM | POA: Diagnosis not present

## 2018-11-13 DIAGNOSIS — R1312 Dysphagia, oropharyngeal phase: Secondary | ICD-10-CM | POA: Diagnosis not present

## 2018-11-13 DIAGNOSIS — G9340 Encephalopathy, unspecified: Secondary | ICD-10-CM | POA: Diagnosis not present

## 2018-11-13 DIAGNOSIS — F0391 Unspecified dementia with behavioral disturbance: Secondary | ICD-10-CM | POA: Diagnosis not present

## 2018-11-13 DIAGNOSIS — M6281 Muscle weakness (generalized): Secondary | ICD-10-CM | POA: Diagnosis not present

## 2018-11-13 DIAGNOSIS — I10 Essential (primary) hypertension: Secondary | ICD-10-CM | POA: Diagnosis not present

## 2018-11-13 DIAGNOSIS — E11311 Type 2 diabetes mellitus with unspecified diabetic retinopathy with macular edema: Secondary | ICD-10-CM | POA: Diagnosis not present

## 2018-11-13 DIAGNOSIS — R627 Adult failure to thrive: Secondary | ICD-10-CM | POA: Diagnosis not present

## 2018-11-14 DIAGNOSIS — Z741 Need for assistance with personal care: Secondary | ICD-10-CM | POA: Diagnosis not present

## 2018-11-14 DIAGNOSIS — R1312 Dysphagia, oropharyngeal phase: Secondary | ICD-10-CM | POA: Diagnosis not present

## 2018-11-14 DIAGNOSIS — F259 Schizoaffective disorder, unspecified: Secondary | ICD-10-CM | POA: Diagnosis not present

## 2018-11-14 DIAGNOSIS — M6281 Muscle weakness (generalized): Secondary | ICD-10-CM | POA: Diagnosis not present

## 2018-11-14 DIAGNOSIS — F0150 Vascular dementia without behavioral disturbance: Secondary | ICD-10-CM | POA: Diagnosis not present

## 2018-11-14 DIAGNOSIS — F0391 Unspecified dementia with behavioral disturbance: Secondary | ICD-10-CM | POA: Diagnosis not present

## 2018-11-17 DIAGNOSIS — R1312 Dysphagia, oropharyngeal phase: Secondary | ICD-10-CM | POA: Diagnosis not present

## 2018-11-17 DIAGNOSIS — F259 Schizoaffective disorder, unspecified: Secondary | ICD-10-CM | POA: Diagnosis not present

## 2018-11-17 DIAGNOSIS — Z741 Need for assistance with personal care: Secondary | ICD-10-CM | POA: Diagnosis not present

## 2018-11-17 DIAGNOSIS — F0391 Unspecified dementia with behavioral disturbance: Secondary | ICD-10-CM | POA: Diagnosis not present

## 2018-11-17 DIAGNOSIS — M6281 Muscle weakness (generalized): Secondary | ICD-10-CM | POA: Diagnosis not present

## 2018-11-17 DIAGNOSIS — F0150 Vascular dementia without behavioral disturbance: Secondary | ICD-10-CM | POA: Diagnosis not present

## 2018-11-18 DIAGNOSIS — F0391 Unspecified dementia with behavioral disturbance: Secondary | ICD-10-CM | POA: Diagnosis not present

## 2018-11-18 DIAGNOSIS — R627 Adult failure to thrive: Secondary | ICD-10-CM | POA: Diagnosis not present

## 2018-11-18 DIAGNOSIS — R5381 Other malaise: Secondary | ICD-10-CM | POA: Diagnosis not present

## 2018-11-18 DIAGNOSIS — R1311 Dysphagia, oral phase: Secondary | ICD-10-CM | POA: Diagnosis not present

## 2018-11-18 DIAGNOSIS — F259 Schizoaffective disorder, unspecified: Secondary | ICD-10-CM | POA: Diagnosis not present

## 2018-11-18 DIAGNOSIS — F0150 Vascular dementia without behavioral disturbance: Secondary | ICD-10-CM | POA: Diagnosis not present

## 2018-11-18 DIAGNOSIS — R1312 Dysphagia, oropharyngeal phase: Secondary | ICD-10-CM | POA: Diagnosis not present

## 2018-11-18 DIAGNOSIS — M6281 Muscle weakness (generalized): Secondary | ICD-10-CM | POA: Diagnosis not present

## 2018-11-18 DIAGNOSIS — Z741 Need for assistance with personal care: Secondary | ICD-10-CM | POA: Diagnosis not present

## 2018-11-19 DIAGNOSIS — F259 Schizoaffective disorder, unspecified: Secondary | ICD-10-CM | POA: Diagnosis not present

## 2018-11-19 DIAGNOSIS — M6281 Muscle weakness (generalized): Secondary | ICD-10-CM | POA: Diagnosis not present

## 2018-11-19 DIAGNOSIS — Z741 Need for assistance with personal care: Secondary | ICD-10-CM | POA: Diagnosis not present

## 2018-11-19 DIAGNOSIS — R1312 Dysphagia, oropharyngeal phase: Secondary | ICD-10-CM | POA: Diagnosis not present

## 2018-11-19 DIAGNOSIS — F0150 Vascular dementia without behavioral disturbance: Secondary | ICD-10-CM | POA: Diagnosis not present

## 2018-11-19 DIAGNOSIS — F0391 Unspecified dementia with behavioral disturbance: Secondary | ICD-10-CM | POA: Diagnosis not present

## 2018-11-20 DIAGNOSIS — F0150 Vascular dementia without behavioral disturbance: Secondary | ICD-10-CM | POA: Diagnosis not present

## 2018-11-20 DIAGNOSIS — M6281 Muscle weakness (generalized): Secondary | ICD-10-CM | POA: Diagnosis not present

## 2018-11-20 DIAGNOSIS — F259 Schizoaffective disorder, unspecified: Secondary | ICD-10-CM | POA: Diagnosis not present

## 2018-11-20 DIAGNOSIS — F251 Schizoaffective disorder, depressive type: Secondary | ICD-10-CM | POA: Diagnosis not present

## 2018-11-20 DIAGNOSIS — F0391 Unspecified dementia with behavioral disturbance: Secondary | ICD-10-CM | POA: Diagnosis not present

## 2018-11-20 DIAGNOSIS — Z741 Need for assistance with personal care: Secondary | ICD-10-CM | POA: Diagnosis not present

## 2018-11-20 DIAGNOSIS — R1312 Dysphagia, oropharyngeal phase: Secondary | ICD-10-CM | POA: Diagnosis not present

## 2018-11-21 DIAGNOSIS — F0150 Vascular dementia without behavioral disturbance: Secondary | ICD-10-CM | POA: Diagnosis not present

## 2018-11-21 DIAGNOSIS — F259 Schizoaffective disorder, unspecified: Secondary | ICD-10-CM | POA: Diagnosis not present

## 2018-11-21 DIAGNOSIS — Z741 Need for assistance with personal care: Secondary | ICD-10-CM | POA: Diagnosis not present

## 2018-11-21 DIAGNOSIS — R1312 Dysphagia, oropharyngeal phase: Secondary | ICD-10-CM | POA: Diagnosis not present

## 2018-11-21 DIAGNOSIS — M6281 Muscle weakness (generalized): Secondary | ICD-10-CM | POA: Diagnosis not present

## 2018-11-21 DIAGNOSIS — F0391 Unspecified dementia with behavioral disturbance: Secondary | ICD-10-CM | POA: Diagnosis not present

## 2018-11-23 DIAGNOSIS — F259 Schizoaffective disorder, unspecified: Secondary | ICD-10-CM | POA: Diagnosis not present

## 2018-11-23 DIAGNOSIS — F0391 Unspecified dementia with behavioral disturbance: Secondary | ICD-10-CM | POA: Diagnosis not present

## 2018-11-23 DIAGNOSIS — F0150 Vascular dementia without behavioral disturbance: Secondary | ICD-10-CM | POA: Diagnosis not present

## 2018-11-23 DIAGNOSIS — R1312 Dysphagia, oropharyngeal phase: Secondary | ICD-10-CM | POA: Diagnosis not present

## 2018-11-23 DIAGNOSIS — Z741 Need for assistance with personal care: Secondary | ICD-10-CM | POA: Diagnosis not present

## 2018-11-23 DIAGNOSIS — M6281 Muscle weakness (generalized): Secondary | ICD-10-CM | POA: Diagnosis not present

## 2018-11-25 DIAGNOSIS — F0391 Unspecified dementia with behavioral disturbance: Secondary | ICD-10-CM | POA: Diagnosis not present

## 2018-11-25 DIAGNOSIS — R1312 Dysphagia, oropharyngeal phase: Secondary | ICD-10-CM | POA: Diagnosis not present

## 2018-11-25 DIAGNOSIS — Z741 Need for assistance with personal care: Secondary | ICD-10-CM | POA: Diagnosis not present

## 2018-11-25 DIAGNOSIS — M6281 Muscle weakness (generalized): Secondary | ICD-10-CM | POA: Diagnosis not present

## 2018-11-25 DIAGNOSIS — F0150 Vascular dementia without behavioral disturbance: Secondary | ICD-10-CM | POA: Diagnosis not present

## 2018-11-25 DIAGNOSIS — F259 Schizoaffective disorder, unspecified: Secondary | ICD-10-CM | POA: Diagnosis not present

## 2018-11-26 DIAGNOSIS — M6281 Muscle weakness (generalized): Secondary | ICD-10-CM | POA: Diagnosis not present

## 2018-11-26 DIAGNOSIS — R1312 Dysphagia, oropharyngeal phase: Secondary | ICD-10-CM | POA: Diagnosis not present

## 2018-11-26 DIAGNOSIS — F0391 Unspecified dementia with behavioral disturbance: Secondary | ICD-10-CM | POA: Diagnosis not present

## 2018-11-26 DIAGNOSIS — F259 Schizoaffective disorder, unspecified: Secondary | ICD-10-CM | POA: Diagnosis not present

## 2018-11-26 DIAGNOSIS — F0150 Vascular dementia without behavioral disturbance: Secondary | ICD-10-CM | POA: Diagnosis not present

## 2018-11-26 DIAGNOSIS — Z741 Need for assistance with personal care: Secondary | ICD-10-CM | POA: Diagnosis not present

## 2018-11-27 DIAGNOSIS — R1312 Dysphagia, oropharyngeal phase: Secondary | ICD-10-CM | POA: Diagnosis not present

## 2018-11-27 DIAGNOSIS — F0391 Unspecified dementia with behavioral disturbance: Secondary | ICD-10-CM | POA: Diagnosis not present

## 2018-11-27 DIAGNOSIS — F0150 Vascular dementia without behavioral disturbance: Secondary | ICD-10-CM | POA: Diagnosis not present

## 2018-11-27 DIAGNOSIS — Z741 Need for assistance with personal care: Secondary | ICD-10-CM | POA: Diagnosis not present

## 2018-11-27 DIAGNOSIS — M6281 Muscle weakness (generalized): Secondary | ICD-10-CM | POA: Diagnosis not present

## 2018-11-27 DIAGNOSIS — F259 Schizoaffective disorder, unspecified: Secondary | ICD-10-CM | POA: Diagnosis not present

## 2018-11-28 DIAGNOSIS — F259 Schizoaffective disorder, unspecified: Secondary | ICD-10-CM | POA: Diagnosis not present

## 2018-11-28 DIAGNOSIS — R1312 Dysphagia, oropharyngeal phase: Secondary | ICD-10-CM | POA: Diagnosis not present

## 2018-11-28 DIAGNOSIS — I10 Essential (primary) hypertension: Secondary | ICD-10-CM | POA: Diagnosis not present

## 2018-11-28 DIAGNOSIS — F0150 Vascular dementia without behavioral disturbance: Secondary | ICD-10-CM | POA: Diagnosis not present

## 2018-11-28 DIAGNOSIS — M6281 Muscle weakness (generalized): Secondary | ICD-10-CM | POA: Diagnosis not present

## 2018-11-28 DIAGNOSIS — Z741 Need for assistance with personal care: Secondary | ICD-10-CM | POA: Diagnosis not present

## 2018-11-28 DIAGNOSIS — F0391 Unspecified dementia with behavioral disturbance: Secondary | ICD-10-CM | POA: Diagnosis not present

## 2018-11-28 DIAGNOSIS — E0837X3 Diabetes mellitus due to underlying condition with diabetic macular edema, resolved following treatment, bilateral: Secondary | ICD-10-CM | POA: Diagnosis not present

## 2018-11-30 DIAGNOSIS — F0150 Vascular dementia without behavioral disturbance: Secondary | ICD-10-CM | POA: Diagnosis not present

## 2018-11-30 DIAGNOSIS — F259 Schizoaffective disorder, unspecified: Secondary | ICD-10-CM | POA: Diagnosis not present

## 2018-11-30 DIAGNOSIS — M6281 Muscle weakness (generalized): Secondary | ICD-10-CM | POA: Diagnosis not present

## 2018-11-30 DIAGNOSIS — R1312 Dysphagia, oropharyngeal phase: Secondary | ICD-10-CM | POA: Diagnosis not present

## 2018-11-30 DIAGNOSIS — Z741 Need for assistance with personal care: Secondary | ICD-10-CM | POA: Diagnosis not present

## 2018-11-30 DIAGNOSIS — F0391 Unspecified dementia with behavioral disturbance: Secondary | ICD-10-CM | POA: Diagnosis not present

## 2018-12-02 DIAGNOSIS — M6281 Muscle weakness (generalized): Secondary | ICD-10-CM | POA: Diagnosis not present

## 2018-12-02 DIAGNOSIS — R627 Adult failure to thrive: Secondary | ICD-10-CM | POA: Diagnosis not present

## 2018-12-02 DIAGNOSIS — R1311 Dysphagia, oral phase: Secondary | ICD-10-CM | POA: Diagnosis not present

## 2018-12-02 DIAGNOSIS — R5381 Other malaise: Secondary | ICD-10-CM | POA: Diagnosis not present

## 2018-12-02 DIAGNOSIS — F259 Schizoaffective disorder, unspecified: Secondary | ICD-10-CM | POA: Diagnosis not present

## 2018-12-02 DIAGNOSIS — R1312 Dysphagia, oropharyngeal phase: Secondary | ICD-10-CM | POA: Diagnosis not present

## 2018-12-02 DIAGNOSIS — E11311 Type 2 diabetes mellitus with unspecified diabetic retinopathy with macular edema: Secondary | ICD-10-CM | POA: Diagnosis not present

## 2018-12-02 DIAGNOSIS — Z741 Need for assistance with personal care: Secondary | ICD-10-CM | POA: Diagnosis not present

## 2018-12-02 DIAGNOSIS — F0150 Vascular dementia without behavioral disturbance: Secondary | ICD-10-CM | POA: Diagnosis not present

## 2018-12-02 DIAGNOSIS — F0391 Unspecified dementia with behavioral disturbance: Secondary | ICD-10-CM | POA: Diagnosis not present

## 2018-12-03 DIAGNOSIS — R1312 Dysphagia, oropharyngeal phase: Secondary | ICD-10-CM | POA: Diagnosis not present

## 2018-12-03 DIAGNOSIS — M6281 Muscle weakness (generalized): Secondary | ICD-10-CM | POA: Diagnosis not present

## 2018-12-03 DIAGNOSIS — Z741 Need for assistance with personal care: Secondary | ICD-10-CM | POA: Diagnosis not present

## 2018-12-03 DIAGNOSIS — F0391 Unspecified dementia with behavioral disturbance: Secondary | ICD-10-CM | POA: Diagnosis not present

## 2018-12-03 DIAGNOSIS — F259 Schizoaffective disorder, unspecified: Secondary | ICD-10-CM | POA: Diagnosis not present

## 2018-12-03 DIAGNOSIS — F0150 Vascular dementia without behavioral disturbance: Secondary | ICD-10-CM | POA: Diagnosis not present

## 2018-12-04 DIAGNOSIS — F0150 Vascular dementia without behavioral disturbance: Secondary | ICD-10-CM | POA: Diagnosis not present

## 2018-12-04 DIAGNOSIS — Z741 Need for assistance with personal care: Secondary | ICD-10-CM | POA: Diagnosis not present

## 2018-12-04 DIAGNOSIS — R1312 Dysphagia, oropharyngeal phase: Secondary | ICD-10-CM | POA: Diagnosis not present

## 2018-12-04 DIAGNOSIS — F259 Schizoaffective disorder, unspecified: Secondary | ICD-10-CM | POA: Diagnosis not present

## 2018-12-04 DIAGNOSIS — F0391 Unspecified dementia with behavioral disturbance: Secondary | ICD-10-CM | POA: Diagnosis not present

## 2018-12-04 DIAGNOSIS — M6281 Muscle weakness (generalized): Secondary | ICD-10-CM | POA: Diagnosis not present

## 2018-12-05 DIAGNOSIS — R1312 Dysphagia, oropharyngeal phase: Secondary | ICD-10-CM | POA: Diagnosis not present

## 2018-12-05 DIAGNOSIS — F0150 Vascular dementia without behavioral disturbance: Secondary | ICD-10-CM | POA: Diagnosis not present

## 2018-12-05 DIAGNOSIS — F259 Schizoaffective disorder, unspecified: Secondary | ICD-10-CM | POA: Diagnosis not present

## 2018-12-05 DIAGNOSIS — Z741 Need for assistance with personal care: Secondary | ICD-10-CM | POA: Diagnosis not present

## 2018-12-05 DIAGNOSIS — F0391 Unspecified dementia with behavioral disturbance: Secondary | ICD-10-CM | POA: Diagnosis not present

## 2018-12-05 DIAGNOSIS — M6281 Muscle weakness (generalized): Secondary | ICD-10-CM | POA: Diagnosis not present

## 2018-12-08 DIAGNOSIS — R1312 Dysphagia, oropharyngeal phase: Secondary | ICD-10-CM | POA: Diagnosis not present

## 2018-12-08 DIAGNOSIS — M6281 Muscle weakness (generalized): Secondary | ICD-10-CM | POA: Diagnosis not present

## 2018-12-08 DIAGNOSIS — F0391 Unspecified dementia with behavioral disturbance: Secondary | ICD-10-CM | POA: Diagnosis not present

## 2018-12-08 DIAGNOSIS — Z741 Need for assistance with personal care: Secondary | ICD-10-CM | POA: Diagnosis not present

## 2018-12-08 DIAGNOSIS — F0150 Vascular dementia without behavioral disturbance: Secondary | ICD-10-CM | POA: Diagnosis not present

## 2018-12-08 DIAGNOSIS — F259 Schizoaffective disorder, unspecified: Secondary | ICD-10-CM | POA: Diagnosis not present

## 2018-12-09 DIAGNOSIS — F0150 Vascular dementia without behavioral disturbance: Secondary | ICD-10-CM | POA: Diagnosis not present

## 2018-12-09 DIAGNOSIS — F259 Schizoaffective disorder, unspecified: Secondary | ICD-10-CM | POA: Diagnosis not present

## 2018-12-09 DIAGNOSIS — R1312 Dysphagia, oropharyngeal phase: Secondary | ICD-10-CM | POA: Diagnosis not present

## 2018-12-09 DIAGNOSIS — F0391 Unspecified dementia with behavioral disturbance: Secondary | ICD-10-CM | POA: Diagnosis not present

## 2018-12-10 DIAGNOSIS — F259 Schizoaffective disorder, unspecified: Secondary | ICD-10-CM | POA: Diagnosis not present

## 2018-12-10 DIAGNOSIS — F0150 Vascular dementia without behavioral disturbance: Secondary | ICD-10-CM | POA: Diagnosis not present

## 2018-12-10 DIAGNOSIS — R1312 Dysphagia, oropharyngeal phase: Secondary | ICD-10-CM | POA: Diagnosis not present

## 2018-12-10 DIAGNOSIS — F0391 Unspecified dementia with behavioral disturbance: Secondary | ICD-10-CM | POA: Diagnosis not present

## 2018-12-11 DIAGNOSIS — E782 Mixed hyperlipidemia: Secondary | ICD-10-CM | POA: Diagnosis not present

## 2018-12-11 DIAGNOSIS — K219 Gastro-esophageal reflux disease without esophagitis: Secondary | ICD-10-CM | POA: Diagnosis not present

## 2018-12-11 DIAGNOSIS — M159 Polyosteoarthritis, unspecified: Secondary | ICD-10-CM | POA: Diagnosis not present

## 2018-12-11 DIAGNOSIS — R1311 Dysphagia, oral phase: Secondary | ICD-10-CM | POA: Diagnosis not present

## 2018-12-18 DIAGNOSIS — F0150 Vascular dementia without behavioral disturbance: Secondary | ICD-10-CM | POA: Diagnosis not present

## 2018-12-18 DIAGNOSIS — F251 Schizoaffective disorder, depressive type: Secondary | ICD-10-CM | POA: Diagnosis not present

## 2018-12-29 DIAGNOSIS — Z20828 Contact with and (suspected) exposure to other viral communicable diseases: Secondary | ICD-10-CM | POA: Diagnosis not present

## 2018-12-30 DIAGNOSIS — R5381 Other malaise: Secondary | ICD-10-CM | POA: Diagnosis not present

## 2018-12-30 DIAGNOSIS — R627 Adult failure to thrive: Secondary | ICD-10-CM | POA: Diagnosis not present

## 2018-12-30 DIAGNOSIS — I10 Essential (primary) hypertension: Secondary | ICD-10-CM | POA: Diagnosis not present

## 2018-12-30 DIAGNOSIS — E11311 Type 2 diabetes mellitus with unspecified diabetic retinopathy with macular edema: Secondary | ICD-10-CM | POA: Diagnosis not present

## 2019-01-01 DIAGNOSIS — E0837X3 Diabetes mellitus due to underlying condition with diabetic macular edema, resolved following treatment, bilateral: Secondary | ICD-10-CM | POA: Diagnosis not present

## 2019-01-01 DIAGNOSIS — I10 Essential (primary) hypertension: Secondary | ICD-10-CM | POA: Diagnosis not present

## 2019-01-10 DIAGNOSIS — Z23 Encounter for immunization: Secondary | ICD-10-CM | POA: Diagnosis not present

## 2019-01-15 DIAGNOSIS — R627 Adult failure to thrive: Secondary | ICD-10-CM | POA: Diagnosis not present

## 2019-01-15 DIAGNOSIS — Z794 Long term (current) use of insulin: Secondary | ICD-10-CM | POA: Diagnosis not present

## 2019-01-15 DIAGNOSIS — H353133 Nonexudative age-related macular degeneration, bilateral, advanced atrophic without subfoveal involvement: Secondary | ICD-10-CM | POA: Diagnosis not present

## 2019-01-15 DIAGNOSIS — E11311 Type 2 diabetes mellitus with unspecified diabetic retinopathy with macular edema: Secondary | ICD-10-CM | POA: Diagnosis not present

## 2019-01-15 DIAGNOSIS — E119 Type 2 diabetes mellitus without complications: Secondary | ICD-10-CM | POA: Diagnosis not present

## 2019-01-15 DIAGNOSIS — H04123 Dry eye syndrome of bilateral lacrimal glands: Secondary | ICD-10-CM | POA: Diagnosis not present

## 2019-01-15 DIAGNOSIS — R5381 Other malaise: Secondary | ICD-10-CM | POA: Diagnosis not present

## 2019-01-15 DIAGNOSIS — I10 Essential (primary) hypertension: Secondary | ICD-10-CM | POA: Diagnosis not present

## 2019-01-23 DIAGNOSIS — R627 Adult failure to thrive: Secondary | ICD-10-CM | POA: Diagnosis not present

## 2019-01-23 DIAGNOSIS — I10 Essential (primary) hypertension: Secondary | ICD-10-CM | POA: Diagnosis not present

## 2019-01-23 DIAGNOSIS — R5381 Other malaise: Secondary | ICD-10-CM | POA: Diagnosis not present

## 2019-01-23 DIAGNOSIS — E11311 Type 2 diabetes mellitus with unspecified diabetic retinopathy with macular edema: Secondary | ICD-10-CM | POA: Diagnosis not present

## 2019-01-29 DIAGNOSIS — F0150 Vascular dementia without behavioral disturbance: Secondary | ICD-10-CM | POA: Diagnosis not present

## 2019-01-29 DIAGNOSIS — F251 Schizoaffective disorder, depressive type: Secondary | ICD-10-CM | POA: Diagnosis not present

## 2019-02-02 DIAGNOSIS — Z20828 Contact with and (suspected) exposure to other viral communicable diseases: Secondary | ICD-10-CM | POA: Diagnosis not present

## 2019-02-05 DIAGNOSIS — E0837X3 Diabetes mellitus due to underlying condition with diabetic macular edema, resolved following treatment, bilateral: Secondary | ICD-10-CM | POA: Diagnosis not present

## 2019-02-05 DIAGNOSIS — I10 Essential (primary) hypertension: Secondary | ICD-10-CM | POA: Diagnosis not present

## 2019-02-09 DIAGNOSIS — Z20828 Contact with and (suspected) exposure to other viral communicable diseases: Secondary | ICD-10-CM | POA: Diagnosis not present

## 2019-02-11 DIAGNOSIS — U071 COVID-19: Secondary | ICD-10-CM | POA: Diagnosis not present

## 2019-02-27 DIAGNOSIS — Z7409 Other reduced mobility: Secondary | ICD-10-CM | POA: Diagnosis not present

## 2019-02-27 DIAGNOSIS — M6281 Muscle weakness (generalized): Secondary | ICD-10-CM | POA: Diagnosis not present

## 2019-02-27 DIAGNOSIS — L89323 Pressure ulcer of left buttock, stage 3: Secondary | ICD-10-CM | POA: Diagnosis not present

## 2019-02-28 DIAGNOSIS — F419 Anxiety disorder, unspecified: Secondary | ICD-10-CM | POA: Diagnosis not present

## 2019-02-28 DIAGNOSIS — R627 Adult failure to thrive: Secondary | ICD-10-CM | POA: Diagnosis not present

## 2019-02-28 DIAGNOSIS — U071 COVID-19: Secondary | ICD-10-CM | POA: Diagnosis not present

## 2019-03-03 DIAGNOSIS — I10 Essential (primary) hypertension: Secondary | ICD-10-CM | POA: Diagnosis not present

## 2019-03-03 DIAGNOSIS — E0837X3 Diabetes mellitus due to underlying condition with diabetic macular edema, resolved following treatment, bilateral: Secondary | ICD-10-CM | POA: Diagnosis not present

## 2019-03-07 DIAGNOSIS — Z7409 Other reduced mobility: Secondary | ICD-10-CM | POA: Diagnosis not present

## 2019-03-07 DIAGNOSIS — M6281 Muscle weakness (generalized): Secondary | ICD-10-CM | POA: Diagnosis not present

## 2019-03-07 DIAGNOSIS — L89323 Pressure ulcer of left buttock, stage 3: Secondary | ICD-10-CM | POA: Diagnosis not present

## 2019-03-11 DIAGNOSIS — M6281 Muscle weakness (generalized): Secondary | ICD-10-CM | POA: Diagnosis not present

## 2019-03-11 DIAGNOSIS — M70872 Other soft tissue disorders related to use, overuse and pressure, left ankle and foot: Secondary | ICD-10-CM | POA: Diagnosis not present

## 2019-03-11 DIAGNOSIS — L89323 Pressure ulcer of left buttock, stage 3: Secondary | ICD-10-CM | POA: Diagnosis not present

## 2019-03-11 DIAGNOSIS — Z7409 Other reduced mobility: Secondary | ICD-10-CM | POA: Diagnosis not present

## 2019-03-12 DIAGNOSIS — E1140 Type 2 diabetes mellitus with diabetic neuropathy, unspecified: Secondary | ICD-10-CM | POA: Diagnosis not present

## 2019-03-18 DIAGNOSIS — L89323 Pressure ulcer of left buttock, stage 3: Secondary | ICD-10-CM | POA: Diagnosis not present

## 2019-03-18 DIAGNOSIS — M6281 Muscle weakness (generalized): Secondary | ICD-10-CM | POA: Diagnosis not present

## 2019-03-18 DIAGNOSIS — M70872 Other soft tissue disorders related to use, overuse and pressure, left ankle and foot: Secondary | ICD-10-CM | POA: Diagnosis not present

## 2019-03-18 DIAGNOSIS — Z7409 Other reduced mobility: Secondary | ICD-10-CM | POA: Diagnosis not present

## 2019-04-10 DEATH — deceased
